# Patient Record
Sex: Female | Born: 1945
Health system: Southern US, Community
[De-identification: ages and names within clinical notes are randomized; demographics above are authoritative.]

## PROBLEM LIST (undated history)

## (undated) DIAGNOSIS — Z803 Family history of malignant neoplasm of breast: Secondary | ICD-10-CM

## (undated) DIAGNOSIS — M797 Fibromyalgia: Secondary | ICD-10-CM

## (undated) DIAGNOSIS — R112 Nausea with vomiting, unspecified: Secondary | ICD-10-CM

## (undated) DIAGNOSIS — Z9889 Other specified postprocedural states: Secondary | ICD-10-CM

## (undated) DIAGNOSIS — D6862 Lupus anticoagulant syndrome: Secondary | ICD-10-CM

## (undated) DIAGNOSIS — E042 Nontoxic multinodular goiter: Secondary | ICD-10-CM

## (undated) DIAGNOSIS — Z8719 Personal history of other diseases of the digestive system: Secondary | ICD-10-CM

## (undated) DIAGNOSIS — F32A Depression, unspecified: Secondary | ICD-10-CM

## (undated) DIAGNOSIS — T4145XA Adverse effect of unspecified anesthetic, initial encounter: Secondary | ICD-10-CM

## (undated) DIAGNOSIS — Z91B Personal risk factor of exposure to diethylstilbestrol: Secondary | ICD-10-CM

## (undated) DIAGNOSIS — E559 Vitamin D deficiency, unspecified: Secondary | ICD-10-CM

## (undated) DIAGNOSIS — Z808 Family history of malignant neoplasm of other organs or systems: Secondary | ICD-10-CM

## (undated) DIAGNOSIS — F419 Anxiety disorder, unspecified: Secondary | ICD-10-CM

## (undated) DIAGNOSIS — Z860100 Personal history of colon polyps, unspecified: Secondary | ICD-10-CM

## (undated) DIAGNOSIS — Z800 Family history of malignant neoplasm of digestive organs: Secondary | ICD-10-CM

## (undated) DIAGNOSIS — I2699 Other pulmonary embolism without acute cor pulmonale: Secondary | ICD-10-CM

## (undated) DIAGNOSIS — Z9189 Other specified personal risk factors, not elsewhere classified: Secondary | ICD-10-CM

## (undated) DIAGNOSIS — R0609 Other forms of dyspnea: Secondary | ICD-10-CM

## (undated) DIAGNOSIS — I447 Left bundle-branch block, unspecified: Secondary | ICD-10-CM

## (undated) DIAGNOSIS — K219 Gastro-esophageal reflux disease without esophagitis: Secondary | ICD-10-CM

## (undated) DIAGNOSIS — E782 Mixed hyperlipidemia: Secondary | ICD-10-CM

## (undated) DIAGNOSIS — Z8041 Family history of malignant neoplasm of ovary: Secondary | ICD-10-CM

## (undated) DIAGNOSIS — G479 Sleep disorder, unspecified: Secondary | ICD-10-CM

## (undated) DIAGNOSIS — T8859XA Other complications of anesthesia, initial encounter: Secondary | ICD-10-CM

## (undated) DIAGNOSIS — R001 Bradycardia, unspecified: Secondary | ICD-10-CM

## (undated) DIAGNOSIS — I82409 Acute embolism and thrombosis of unspecified deep veins of unspecified lower extremity: Secondary | ICD-10-CM

## (undated) DIAGNOSIS — D069 Carcinoma in situ of cervix, unspecified: Secondary | ICD-10-CM

## (undated) DIAGNOSIS — M1990 Unspecified osteoarthritis, unspecified site: Secondary | ICD-10-CM

## (undated) DIAGNOSIS — F329 Major depressive disorder, single episode, unspecified: Secondary | ICD-10-CM

## (undated) DIAGNOSIS — E079 Disorder of thyroid, unspecified: Secondary | ICD-10-CM

## (undated) DIAGNOSIS — K589 Irritable bowel syndrome without diarrhea: Secondary | ICD-10-CM

## (undated) DIAGNOSIS — R9431 Abnormal electrocardiogram [ECG] [EKG]: Secondary | ICD-10-CM

## (undated) DIAGNOSIS — G2581 Restless legs syndrome: Secondary | ICD-10-CM

## (undated) HISTORY — DX: Unspecified osteoarthritis, unspecified site: M19.90

## (undated) HISTORY — DX: Abnormal electrocardiogram (ECG) (EKG): R94.31

## (undated) HISTORY — DX: Lupus anticoagulant syndrome: D68.62

## (undated) HISTORY — DX: Bradycardia, unspecified: R00.1

## (undated) HISTORY — DX: Vitamin D deficiency, unspecified: E55.9

## (undated) HISTORY — DX: Family history of malignant neoplasm of breast: Z80.3

## (undated) HISTORY — PX: FACIAL COSMETIC SURGERY: SHX629

## (undated) HISTORY — PX: TONSILLECTOMY: SUR1361

## (undated) HISTORY — DX: Other specified personal risk factors, not elsewhere classified: Z91.89

## (undated) HISTORY — PX: MYOMECTOMY: SHX85

## (undated) HISTORY — DX: Personal history of colon polyps, unspecified: Z86.0100

## (undated) HISTORY — DX: Family history of malignant neoplasm of ovary: Z80.41

## (undated) HISTORY — PX: OTHER SURGICAL HISTORY: SHX169

## (undated) HISTORY — PX: ESOPHAGOGASTRODUODENOSCOPY ENDOSCOPY: SHX5814

## (undated) HISTORY — DX: Carcinoma in situ of cervix, unspecified: D06.9

## (undated) HISTORY — DX: Disorder of thyroid, unspecified: E07.9

## (undated) HISTORY — DX: Family history of malignant neoplasm of other organs or systems: Z80.8

## (undated) HISTORY — DX: Fibromyalgia: M79.7

## (undated) HISTORY — DX: Other forms of dyspnea: R06.09

## (undated) HISTORY — DX: Acute embolism and thrombosis of unspecified deep veins of unspecified lower extremity: I82.409

## (undated) HISTORY — DX: Other pulmonary embolism without acute cor pulmonale: I26.99

## (undated) HISTORY — DX: Sleep disorder, unspecified: G47.9

## (undated) HISTORY — DX: Personal risk factor of exposure to diethylstilbestrol: Z91.B

## (undated) HISTORY — DX: Family history of malignant neoplasm of digestive organs: Z80.0

## (undated) HISTORY — PX: GYNECOLOGIC CRYOSURGERY: SHX857

## (undated) HISTORY — DX: Irritable bowel syndrome without diarrhea: K58.9

## (undated) HISTORY — DX: Left bundle-branch block, unspecified: I44.7

## (undated) HISTORY — DX: Personal history of colon polyps: Z86.010

## (undated) HISTORY — DX: Nontoxic multinodular goiter: E04.2

---

## 1976-08-01 HISTORY — PX: APPENDECTOMY: SHX54

## 1998-01-22 ENCOUNTER — Other Ambulatory Visit: Admission: RE | Admit: 1998-01-22 | Discharge: 1998-01-22 | Payer: Self-pay | Admitting: Obstetrics and Gynecology

## 1998-03-23 ENCOUNTER — Other Ambulatory Visit: Admission: RE | Admit: 1998-03-23 | Discharge: 1998-03-23 | Payer: Self-pay | Admitting: Obstetrics and Gynecology

## 1998-07-08 ENCOUNTER — Ambulatory Visit (HOSPITAL_COMMUNITY): Admission: RE | Admit: 1998-07-08 | Discharge: 1998-07-08 | Payer: Self-pay | Admitting: Gastroenterology

## 1998-08-01 DIAGNOSIS — D069 Carcinoma in situ of cervix, unspecified: Secondary | ICD-10-CM

## 1998-08-01 HISTORY — DX: Carcinoma in situ of cervix, unspecified: D06.9

## 1998-09-23 ENCOUNTER — Encounter: Payer: Self-pay | Admitting: Internal Medicine

## 1998-09-23 ENCOUNTER — Ambulatory Visit (HOSPITAL_COMMUNITY): Admission: RE | Admit: 1998-09-23 | Discharge: 1998-09-23 | Payer: Self-pay | Admitting: Internal Medicine

## 1999-03-24 ENCOUNTER — Other Ambulatory Visit: Admission: RE | Admit: 1999-03-24 | Discharge: 1999-03-24 | Payer: Self-pay | Admitting: Obstetrics and Gynecology

## 1999-04-19 ENCOUNTER — Other Ambulatory Visit: Admission: RE | Admit: 1999-04-19 | Discharge: 1999-04-19 | Payer: Self-pay | Admitting: Obstetrics and Gynecology

## 1999-04-19 ENCOUNTER — Encounter (INDEPENDENT_AMBULATORY_CARE_PROVIDER_SITE_OTHER): Payer: Self-pay | Admitting: Specialist

## 1999-06-04 ENCOUNTER — Encounter (INDEPENDENT_AMBULATORY_CARE_PROVIDER_SITE_OTHER): Payer: Self-pay | Admitting: Specialist

## 1999-06-04 ENCOUNTER — Ambulatory Visit (HOSPITAL_COMMUNITY): Admission: RE | Admit: 1999-06-04 | Discharge: 1999-06-04 | Payer: Self-pay | Admitting: Obstetrics and Gynecology

## 1999-08-02 HISTORY — PX: ABDOMINAL HYSTERECTOMY: SHX81

## 1999-08-18 ENCOUNTER — Encounter: Payer: Self-pay | Admitting: Internal Medicine

## 1999-08-18 ENCOUNTER — Encounter: Admission: RE | Admit: 1999-08-18 | Discharge: 1999-08-18 | Payer: Self-pay | Admitting: Internal Medicine

## 1999-09-20 ENCOUNTER — Other Ambulatory Visit: Admission: RE | Admit: 1999-09-20 | Discharge: 1999-09-20 | Payer: Self-pay | Admitting: Obstetrics and Gynecology

## 1999-11-01 ENCOUNTER — Encounter (INDEPENDENT_AMBULATORY_CARE_PROVIDER_SITE_OTHER): Payer: Self-pay

## 1999-11-01 ENCOUNTER — Inpatient Hospital Stay (HOSPITAL_COMMUNITY): Admission: RE | Admit: 1999-11-01 | Discharge: 1999-11-04 | Payer: Self-pay | Admitting: Obstetrics and Gynecology

## 2001-01-19 ENCOUNTER — Other Ambulatory Visit: Admission: RE | Admit: 2001-01-19 | Discharge: 2001-01-19 | Payer: Self-pay | Admitting: Obstetrics and Gynecology

## 2001-06-04 ENCOUNTER — Encounter: Admission: RE | Admit: 2001-06-04 | Discharge: 2001-06-04 | Payer: Self-pay | Admitting: Rheumatology

## 2001-06-04 ENCOUNTER — Encounter: Payer: Self-pay | Admitting: Rheumatology

## 2001-07-11 ENCOUNTER — Ambulatory Visit (HOSPITAL_COMMUNITY): Admission: RE | Admit: 2001-07-11 | Discharge: 2001-07-11 | Payer: Self-pay | Admitting: Internal Medicine

## 2001-08-27 ENCOUNTER — Ambulatory Visit (HOSPITAL_COMMUNITY): Admission: RE | Admit: 2001-08-27 | Discharge: 2001-08-27 | Payer: Self-pay | Admitting: Gastroenterology

## 2002-01-28 ENCOUNTER — Other Ambulatory Visit: Admission: RE | Admit: 2002-01-28 | Discharge: 2002-01-28 | Payer: Self-pay | Admitting: Obstetrics and Gynecology

## 2002-09-19 ENCOUNTER — Encounter: Payer: Self-pay | Admitting: General Surgery

## 2002-09-19 ENCOUNTER — Ambulatory Visit (HOSPITAL_COMMUNITY): Admission: RE | Admit: 2002-09-19 | Discharge: 2002-09-19 | Payer: Self-pay | Admitting: General Surgery

## 2002-09-20 ENCOUNTER — Encounter: Payer: Self-pay | Admitting: General Surgery

## 2002-10-12 ENCOUNTER — Encounter: Admission: RE | Admit: 2002-10-12 | Discharge: 2002-10-12 | Payer: Self-pay | Admitting: Rheumatology

## 2002-10-12 ENCOUNTER — Encounter: Payer: Self-pay | Admitting: Rheumatology

## 2002-11-29 ENCOUNTER — Encounter: Payer: Self-pay | Admitting: Internal Medicine

## 2002-11-29 ENCOUNTER — Encounter: Admission: RE | Admit: 2002-11-29 | Discharge: 2002-11-29 | Payer: Self-pay | Admitting: Internal Medicine

## 2003-01-29 ENCOUNTER — Other Ambulatory Visit: Admission: RE | Admit: 2003-01-29 | Discharge: 2003-01-29 | Payer: Self-pay | Admitting: Obstetrics and Gynecology

## 2003-04-04 ENCOUNTER — Observation Stay (HOSPITAL_COMMUNITY): Admission: EM | Admit: 2003-04-04 | Discharge: 2003-04-05 | Payer: Self-pay | Admitting: *Deleted

## 2003-04-04 ENCOUNTER — Encounter: Payer: Self-pay | Admitting: *Deleted

## 2003-04-08 ENCOUNTER — Encounter (HOSPITAL_COMMUNITY): Admission: RE | Admit: 2003-04-08 | Discharge: 2003-07-07 | Payer: Self-pay | Admitting: Endocrinology

## 2003-04-10 ENCOUNTER — Encounter: Payer: Self-pay | Admitting: Endocrinology

## 2004-01-30 ENCOUNTER — Other Ambulatory Visit: Admission: RE | Admit: 2004-01-30 | Discharge: 2004-01-30 | Payer: Self-pay | Admitting: Obstetrics and Gynecology

## 2004-04-01 DIAGNOSIS — I82409 Acute embolism and thrombosis of unspecified deep veins of unspecified lower extremity: Secondary | ICD-10-CM | POA: Insufficient documentation

## 2004-04-26 ENCOUNTER — Encounter: Admission: RE | Admit: 2004-04-26 | Discharge: 2004-04-26 | Payer: Self-pay | Admitting: Family Medicine

## 2004-04-26 ENCOUNTER — Emergency Department (HOSPITAL_COMMUNITY): Admission: EM | Admit: 2004-04-26 | Discharge: 2004-04-26 | Payer: Self-pay | Admitting: Emergency Medicine

## 2004-05-27 ENCOUNTER — Emergency Department (HOSPITAL_COMMUNITY): Admission: EM | Admit: 2004-05-27 | Discharge: 2004-05-27 | Payer: Self-pay | Admitting: Emergency Medicine

## 2004-07-30 ENCOUNTER — Encounter: Admission: RE | Admit: 2004-07-30 | Discharge: 2004-07-30 | Payer: Self-pay | Admitting: Internal Medicine

## 2005-01-05 ENCOUNTER — Ambulatory Visit (HOSPITAL_COMMUNITY): Admission: RE | Admit: 2005-01-05 | Discharge: 2005-01-05 | Payer: Self-pay | Admitting: Internal Medicine

## 2005-02-16 ENCOUNTER — Other Ambulatory Visit: Admission: RE | Admit: 2005-02-16 | Discharge: 2005-02-16 | Payer: Self-pay | Admitting: Obstetrics and Gynecology

## 2005-07-08 ENCOUNTER — Encounter: Admission: RE | Admit: 2005-07-08 | Discharge: 2005-07-08 | Payer: Self-pay | Admitting: Internal Medicine

## 2005-09-30 ENCOUNTER — Ambulatory Visit (HOSPITAL_COMMUNITY): Admission: RE | Admit: 2005-09-30 | Discharge: 2005-09-30 | Payer: Self-pay | Admitting: Internal Medicine

## 2006-03-01 ENCOUNTER — Other Ambulatory Visit: Admission: RE | Admit: 2006-03-01 | Discharge: 2006-03-01 | Payer: Self-pay | Admitting: Obstetrics and Gynecology

## 2006-03-06 ENCOUNTER — Encounter: Admission: RE | Admit: 2006-03-06 | Discharge: 2006-03-06 | Payer: Self-pay | Admitting: Internal Medicine

## 2007-03-07 ENCOUNTER — Other Ambulatory Visit: Admission: RE | Admit: 2007-03-07 | Discharge: 2007-03-07 | Payer: Self-pay | Admitting: Obstetrics and Gynecology

## 2007-07-17 ENCOUNTER — Ambulatory Visit: Payer: Self-pay | Admitting: Oncology

## 2007-08-28 ENCOUNTER — Ambulatory Visit: Admission: RE | Admit: 2007-08-28 | Discharge: 2007-08-28 | Payer: Self-pay | Admitting: Oncology

## 2007-08-28 ENCOUNTER — Ambulatory Visit: Payer: Self-pay | Admitting: Surgery

## 2007-08-28 ENCOUNTER — Encounter (HOSPITAL_COMMUNITY): Payer: Self-pay | Admitting: Oncology

## 2007-10-18 ENCOUNTER — Ambulatory Visit: Payer: Self-pay | Admitting: Oncology

## 2008-04-08 ENCOUNTER — Other Ambulatory Visit: Admission: RE | Admit: 2008-04-08 | Discharge: 2008-04-08 | Payer: Self-pay | Admitting: Obstetrics and Gynecology

## 2008-04-08 ENCOUNTER — Ambulatory Visit: Payer: Self-pay | Admitting: Obstetrics and Gynecology

## 2008-04-08 ENCOUNTER — Encounter: Payer: Self-pay | Admitting: Obstetrics and Gynecology

## 2008-09-08 ENCOUNTER — Ambulatory Visit: Payer: Self-pay | Admitting: Obstetrics and Gynecology

## 2008-09-12 ENCOUNTER — Ambulatory Visit (HOSPITAL_COMMUNITY): Admission: RE | Admit: 2008-09-12 | Discharge: 2008-09-12 | Payer: Self-pay | Admitting: Obstetrics and Gynecology

## 2009-01-29 DIAGNOSIS — I82409 Acute embolism and thrombosis of unspecified deep veins of unspecified lower extremity: Secondary | ICD-10-CM

## 2009-01-29 HISTORY — DX: Acute embolism and thrombosis of unspecified deep veins of unspecified lower extremity: I82.409

## 2009-02-13 ENCOUNTER — Ambulatory Visit: Payer: Self-pay | Admitting: Women's Health

## 2009-02-27 ENCOUNTER — Inpatient Hospital Stay (HOSPITAL_COMMUNITY): Admission: AD | Admit: 2009-02-27 | Discharge: 2009-03-03 | Payer: Self-pay | Admitting: Internal Medicine

## 2009-03-27 ENCOUNTER — Ambulatory Visit: Payer: Self-pay | Admitting: Oncology

## 2009-12-16 ENCOUNTER — Ambulatory Visit: Payer: Self-pay | Admitting: Obstetrics and Gynecology

## 2009-12-16 ENCOUNTER — Other Ambulatory Visit: Admission: RE | Admit: 2009-12-16 | Discharge: 2009-12-16 | Payer: Self-pay | Admitting: Obstetrics and Gynecology

## 2010-08-22 ENCOUNTER — Encounter: Payer: Self-pay | Admitting: Internal Medicine

## 2010-11-07 LAB — CBC
HCT: 36.1 % (ref 36.0–46.0)
HCT: 38.4 % (ref 36.0–46.0)
HCT: 38.5 % (ref 36.0–46.0)
HCT: 38.2 % (ref 36.0–46.0)
HCT: 43.7 % (ref 36.0–46.0)
Hemoglobin: 12.3 g/dL (ref 12.0–15.0)
Hemoglobin: 13 g/dL (ref 12.0–15.0)
Hemoglobin: 13 g/dL (ref 12.0–15.0)
Hemoglobin: 12.6 g/dL (ref 12.0–15.0)
Hemoglobin: 14.8 g/dL (ref 12.0–15.0)
MCHC: 33.8 g/dL (ref 30.0–36.0)
MCHC: 33.8 g/dL (ref 30.0–36.0)
MCHC: 34.1 g/dL (ref 30.0–36.0)
MCHC: 33.1 g/dL (ref 30.0–36.0)
MCHC: 33.9 g/dL (ref 30.0–36.0)
MCV: 93.9 fL (ref 78.0–100.0)
MCV: 94.1 fL (ref 78.0–100.0)
MCV: 94.8 fL (ref 78.0–100.0)
MCV: 94.5 fL (ref 78.0–100.0)
MCV: 94.6 fL (ref 78.0–100.0)
Platelets: 182 10*3/uL (ref 150–400)
Platelets: 199 10*3/uL (ref 150–400)
Platelets: 240 10*3/uL (ref 150–400)
Platelets: 166 10*3/uL (ref 150–400)
Platelets: 167 10*3/uL (ref 150–400)
RBC: 3.85 MIL/uL — ABNORMAL LOW (ref 3.87–5.11)
RBC: 4.06 MIL/uL (ref 3.87–5.11)
RBC: 4.08 MIL/uL (ref 3.87–5.11)
RBC: 4.04 MIL/uL (ref 3.87–5.11)
RBC: 4.62 MIL/uL (ref 3.87–5.11)
RDW: 13.8 % (ref 11.5–15.5)
RDW: 13.9 % (ref 11.5–15.5)
RDW: 14.3 % (ref 11.5–15.5)
RDW: 14.1 % (ref 11.5–15.5)
RDW: 14.4 % (ref 11.5–15.5)
WBC: 10.6 10*3/uL — ABNORMAL HIGH (ref 4.0–10.5)
WBC: 8.6 10*3/uL (ref 4.0–10.5)
WBC: 9.6 10*3/uL (ref 4.0–10.5)
WBC: 11.4 10*3/uL — ABNORMAL HIGH (ref 4.0–10.5)
WBC: 12.7 10*3/uL — ABNORMAL HIGH (ref 4.0–10.5)

## 2010-11-07 LAB — COMPREHENSIVE METABOLIC PANEL
ALT: 21 U/L (ref 0–35)
AST: 25 U/L (ref 0–37)
Albumin: 3 g/dL — ABNORMAL LOW (ref 3.5–5.2)
Alkaline Phosphatase: 83 U/L (ref 39–117)
BUN: 9 mg/dL (ref 6–23)
CO2: 28 mEq/L (ref 19–32)
Calcium: 8.9 mg/dL (ref 8.4–10.5)
Chloride: 98 mEq/L (ref 96–112)
Creatinine, Ser: 0.64 mg/dL (ref 0.4–1.2)
GFR calc Af Amer: >60 mL/min (ref >60)
GFR calc non Af Amer: >60 mL/min (ref >60)
Glucose, Bld: 112 mg/dL — ABNORMAL HIGH (ref 70–99)
Potassium: 4 mEq/L (ref 3.5–5.1)
Sodium: 135 mEq/L (ref 135–145)
Total Bilirubin: 0.9 mg/dL (ref 0.3–1.2)
Total Protein: 6.6 g/dL (ref 6.0–8.3)

## 2010-11-07 LAB — PROTIME-INR
INR: 2 — ABNORMAL HIGH (ref 0.00–1.49)
INR: 2.6 — ABNORMAL HIGH (ref 0.00–1.49)
INR: 3.1 — ABNORMAL HIGH (ref 0.00–1.49)
INR: 1.1 (ref 0.00–1.49)
INR: 1.2 (ref 0.00–1.49)
Prothrombin Time: 24.1 seconds — ABNORMAL HIGH (ref 11.6–15.2)
Prothrombin Time: 29.4 seconds — ABNORMAL HIGH (ref 11.6–15.2)
Prothrombin Time: 31.5 seconds — ABNORMAL HIGH (ref 11.6–15.2)
Prothrombin Time: 15 seconds (ref 11.6–15.2)
Prothrombin Time: 16 seconds — ABNORMAL HIGH (ref 11.6–15.2)

## 2010-12-14 NOTE — Discharge Summary (Signed)
Emma Lopez, Emma Lopez                 ACCOUNT NO.:  [id number]   MEDICAL RECORD NO.:  [id number]          PATIENT TYPE:  INP   LOCATION:  1537                         FACILITY:  [facility]   PHYSICIAN:  Hollice Espy, M.D.DATE OF BIRTH:  Nov 30, 1945   DATE OF ADMISSION:  02/27/2009  DATE OF DISCHARGE:  03/03/2009                               DISCHARGE SUMMARY   PRIMARY CARE PHYSICIAN:  Juluis Rainier, M.D.   DISCHARGE DIAGNOSES:  1. Bilateral pulmonary emboli.  2. Previous history of deep venous thrombosis  3. Suspected underlying genetic predisposition for thrombosis.  4. History of depression.  5. History of anxiety.  6. Multinodular goiter.  7. Hyperlipidemia.   DISCHARGE MEDICATIONS:  1. Coumadin 1 mg p.o. q.h.s.  This dose may be adjusted following the      patient's follow up appointment with Dr. Juluis Rainier at 11      a.m. on March 06, 2009.  2. She will continue the rest of her medications without change,      Coenzyme Q 2 teaspoons p.o. daily.  3. Multivitamin p.o. daily.  4. Os-Cal 500 D p.o. b.i.d.  5. Vitamin D 10,000 units weekly.  6. Fish oil 3 tablets daily.  7. Fluticasone 2 sprays each nostril daily.  8. Klonopin 1 mg p.o. t.i.d. p.r.n.  9. Lexapro 20 mg p.o. daily.   DISCHARGE DIET:  Regular diet.   ACTIVITY:  Slow to increase.  The patient is advised to be cautious  while on Coumadin to avoid any type of injury.   DISPOSITION:  Improved.   HOSPITAL COURSE:  The patient is a 65 year old white female with past  medical history of previous DVT who presented on February 27, 2009 with  complaints of upper thigh pain and left lower extremity swelling.  She  was evaluated by Dr. Zachery Dauer' partner, Dr. Para March, who ordered Doppler  and the patient was found have a left lower extremity DVT.  The patient  was brought in because she already had a previous DVT earlier.  This  time she was noted to have some minimal dyspnea on exertion.  Labs were  checked and a CT  scan confirmed bilateral pulmonary emboli.  In  addition, based on her previous DVT of which a discernible cause could  not be found, there was a concern about the patient had genetic  underlying predisposition.  Dr. Crista Curb, Hospitalist, caring  for the patient on admission, discussed with Dr. Arline Asp of Hematology,  who recommended that the patient likely had a underlying genetic  condition.  He recommended no further workup at this time.  He said he  would follow the patient as an outpatient.  In the meantime, he  preferred that her primary care doctor manage her Coumadin level.  Over  the next several days, the patient was on Lovenox and Coumadin.  Initially she was put on 5 mg x2 days.  After 2 days, her level rapidly  elevated from a normal range of 1 up to1.6 up to 2.11.  Given her rapid  change, she was given no Coumadin on her  third night and her level went  up to 2.5.  This was felt to be that she was quite sensitive to  Coumadin.  Her dose was then cut in half.  After receiving no Coumadin  on night 4, she received 2.5.  Her level went from 2.5 up to 3.1.  At  this point, it was discussed with pharmacy and the patient was felt to  be extremely sensitive to Coumadin.  It was felt best that she go home  on 1 mg every night starting tonight March 02, 2009.  At this point,  she had completed 5 days of overlap therapy of Coumadin and Lovenox.  It  was felt to be safe to be discharged on Coumadin alone.  We discussed  this with the patient and she otherwise is doing well.  She is  comfortable going home with this plan of following up with her PCP, Dr.  Zachery Dauer in 3 days' time at 11 a.m. on March 06, 2009.  Dr. Zachery Dauer' office  number has been provided for the patient, [phone] as well.  The only  other medication the patient has being given for is Vicodin 5/500 one  p.o. q.6 h. p.r.n. for pain.  The patient will also follow up with Dr.  Kimberlee Nearing at a later point for  further evaluation.  The patient's  other medical issues were stable during this hospitalization.      Hollice Espy, M.D.  Electronically Signed     SKK/MEDQ  D:  03/03/2009  T:  03/03/2009  Job:  [id number]   cc:   Juluis Rainier, M.D.  Fax: [phone]   Samul Dada, M.D.  Fax: [phone]

## 2010-12-14 NOTE — H&P (Signed)
Emma Lopez, Emma Lopez                 ACCOUNT NO.:  [id number]   MEDICAL RECORD NO.:  [id number]          PATIENT TYPE:  INP   LOCATION:  1537                         FACILITY:  [facility]   PHYSICIAN:  Corinna L. Lendell Caprice, MDDATE OF BIRTH:  October 12, 1945   DATE OF ADMISSION:  02/27/2009  DATE OF DISCHARGE:                              HISTORY & PHYSICAL   CHIEF COMPLAINT:  Left leg pain.   HISTORY OF PRESENT ILLNESS:  [name] is a 65 year old white female  with previous history of right leg DVT who was directly admitted from  Dr. Lianne Bushy office with acute left leg DVT.  She developed left thigh  pain and left calf pain about 4 days ago.  Of note, she did fracture a  rib about 5 days ago.  She apparently had a hypercoagulable workup done  as an outpatient several years ago after her previous DVT.  She also  apparently had seen Dr. Arline Asp.  The workup was reportedly negative.  The patient had an outpatient Doppler of the left leg which reportedly  showed DVT of the distal and proximal veins of the left leg.  Dr. Para March  has given me this verbal report, but the official reading is not  currently available.  I have asked his office staff to fax it over.  The  patient reports some chronic shortness of breath from anxiety but maybe  feels slightly more short of breath.  She denies any chest pain.  She  also reports low-grade fever of 99.9 with some myalgias.  Her sister  also had a deep vein thrombosis.   PAST MEDICAL HISTORY:  As above, depression, anxiety, multinodular  goiter, osteoporosis, fibromyalgia, gastroesophageal reflux disease,  hyperlipidemia.   MEDICATIONS:  1. Lexapro 20 mg a day.  2. Klonopin 1 mg as needed.  3. Fluticasone spray daily as needed.  4. Fish oil capsule, Coenzyme Q 10, multivitamin a day.  5. Calcium with vitamin D.  6. Vitamin D 10,000 units weekly.   SOCIAL HISTORY:  She is not married.  She drinks rarely.  She does not  smoke.  She is a retired Runner, broadcasting/film/video  and travel Water quality scientist.   FAMILY HISTORY:  Mother had breast cancer and pulmonary embolus.  Sister  has a DVT.  Father had colon cancer, heart disease.   REVIEW OF SYSTEMS:  As above, otherwise negative.   PHYSICAL EXAMINATION:  VITAL SIGNS:  Temperature is 98.6, blood pressure  133/77, heart rate 76, respiratory rate 18, oxygen saturation 99% on  room air. IN GENERAL:  The patient is well nourished, well developed, in  no acute distress.  HEENT: Normocephalic, atraumatic.  Pupils equal, round, reactive to  light.  Sclerae nonicteric.  Moist mucous membranes.  NECK:  Supple.  No lymphadenopathy.  LUNGS:  Clear to auscultation bilaterally without wheezes, rhonchi or  rales.  CARDIOVASCULAR:  Regular rate and rhythm without murmurs, gallops or  rubs.  ABDOMEN:  Soft, nontender, nondistended.  GU AND RECTAL:  Deferred.  EXTREMITIES:  She has some tenderness over her left calf and thigh with  a positive Homans' sign.  NEUROLOGIC:  Alert and oriented.  Cranial nerves and sensory motor exam  are intact.  PSYCHIATRIC:  Normal affect.  SKIN:  No rash.  MUSCULOSKELETAL:  She has an abdominal binder on and some tenderness.  No bruising over her left chest.   ASSESSMENT/PLAN:  1. Left leg deep venous thrombosis with a history of previous right      leg deep venous thrombosis.  The patient had a hypercoagulable      workup previously which was negative.  She will get Lovenox and      Coumadin and will need long-term Coumadin.  I will notify Dr.      Arline Asp of patient's recent events.  She has given herself Lovenox      in the past, and she may be a candidate for outpatient Lovenox.      However, she does report some worsening of her chronic shortness of      breath, and I will check a CT angiogram to evaluate for pulmonary      embolus.  2. Previous history of deep venous thrombosis.  3. Depression/anxiety.  4. Fibromyalgia.  5. Multinodular goiter.  6. Hyperlipidemia.  7. Osteoporosis.   8. Recent rib fracture.      Corinna L. Lendell Caprice, MD  Electronically Signed     CLS/MEDQ  D:  02/27/2009  T:  02/28/2009  Job:  [id number]   cc:   Dossie Der, MD  Fax: [phone]   Samul Dada, M.D.  Fax: [phone]

## 2010-12-17 NOTE — Procedures (Signed)
[facility]  Patient:    Emma Lopez, Emma Lopez Visit Number: [id number] MRN: [id number]          Service Type: Attending:  Verlin Grills, M.D. Dictated by:   Verlin Grills, M.D. Proc. Date: 08/27/01                             Procedure Report  PROCEDURES:  Esophagogastroduodenoscopy and colonoscopy.  INDICATION FOR PROCEDURE:  Emma Lopez is a 65 year old female born 1945-08-30. She is undergoing diagnostic colonoscopy to evaluate an episode of painless hematochezia. She also reports excessive belching and dysphagia.  In January 1997, an air contrast barium enema was normal. In September 1998, an air contrast barium enema was normal. In December 1999, proctocolonoscopy to the cecum was normal. In April 2000, upper GI x-ray series was normal except for extrinsic compression on the esophagus; CT scan of the chest revealed the left atrium compressing the esophagus. January 2001, abdominal ultrasound was normal. April 19, 2000, flexible proctosigmoidoscopy carried out to 60 cm revealed nonthrombosed external hemorrhoids.  ENDOSCOPIST:  Verlin Grills, M.D.  PREMEDICATION:  Versed 10 mg, Demerol 100 mg.  ENDOSCOPE:  Olympus gastroscope and pediatric colonoscope.  PROCEDURE:  Esophagogastroduodenoscopy.  DESCRIPTION OF PROCEDURE:  After obtaining informed consent, Emma Lopez was placed in the left lateral decubitus position. I administered intravenous Demerol and intravenous Versed to achieve conscious sedation for the procedure. The patients cardiac rhythm, oxygen saturation and blood pressure were monitored throughout the procedure and documented in the medical record.  The Olympus video gastroscope was passed through the posterior hypopharynx into the proximal esophagus without difficulty. The hypopharynx, larynx, and vocal chords appeared normal.  ESOPHAGOSCOPY:  The proximal, mid, and lower segments of the esophageal  mucosa appeared completely normal. There is no esophageal mucosal scarring, stricture formation or esophageal obstruction.  GASTROSCOPY:  Retroflexed view of the gastric cardia and fundus was normal. The gastric body, antrum, and pylorus appeared normal.  DUODENOSCOPY:  The duodenal bulb and descending duodenum appeared normal.  ASSESSMENT:  Normal esophagogastroduodenoscopy.  PROCEDURE:  Proctocolonoscopy to the cecum.  DESCRIPTION OF PROCEDURE:   Anal inspection was normal. Digital rectal exam was normal. The Olympus pediatric video colonoscope was introduced into the rectum and easily advanced to the cecum. Colonic preparation for the exam today was excellent.  RECTUM:  Normal.  SIGMOID COLON/DESCENDING COLON:  Normal.  SPLENIC FLEXURE:  Normal.  TRANSVERSE COLON:  Normal.  HEPATIC FLEXURE:  Normal.  ASCENDING COLON:  Normal.  CECUM/ILEOCECAL VALVE:  Normal.  ASSESSMENT:  Normal proctocolonoscopy to the cecum. No endoscopic evidence for the presence of colorectal neoplasia. Dictated by:   Verlin Grills, M.D. Attending:  Verlin Grills, M.D. DD:  08/27/01 TD:  08/27/01 Job: [id number]

## 2010-12-17 NOTE — H&P (Signed)
NAMESHERISSE, FULLILOVE                           ACCOUNT NO.:  [id number]   MEDICAL RECORD NO.:  [id number]                   PATIENT TYPE:  EMS   LOCATION:  ED                                   [facility]ACILITY:  [facility]   PHYSICIAN:  Darius Bump, M.D.             DATE OF BIRTH:  02/20/1946   DATE OF ADMISSION:  04/04/2003  DATE OF DISCHARGE:                                HISTORY & PHYSICAL   IDENTIFICATION:  A 65 year old woman admitted with chest pain.   HISTORY OF PRESENT ILLNESS:  Mrs. Rolon is a pleasant 65 year old woman who  awoke from a sleep at 4 a.m. today.  She was having a strange dream.  She  noted palpitations on awakening and then had left-sided lateral chest wall  pressure and tingling in her left arm.  She also had symptoms that radiated  to her left neck towards her jaw bone.  She had nausea without vomiting and  felt lightheaded.  Did not feel short of breath.  Pressure and tingling  sensation in her chest and arm were distinct from episodes that she has had  in the past that were felt to be due to panic.  She took two aspirin at home  and then came into the emergency room.  Here she had a slight amount of  chest pain and had a nitroglycerin.  It is unclear if that relieved her pain  or not.  Pain is currently improved.   PAST MEDICAL HISTORY:  1. Osteoporosis.  2. Symptomatic GERD.  3. Fibromyalgia.  4. Toxic multinodular goiter (mild subclinical hyperthyroidism).  5. Panic attacks.  6. Carpal tunnel syndrome previously treated with a wrist splint with     improvement.  7. Normal lipids when checked last in September 2002 with cholesterol 172,     LDL 100, HDL 54.   MEDICATIONS:  1. Calcium 1200 mg daily.  2. Has taken some Motrin recently for knee pain.  3. Evista 60 mg p.o. daily.  4. Klonopin 0.5 mg.  Takes about once a week for anxiety.  5. Tapazole previously stopped about two months ago.  Recent TSH was     slightly suppressed at about 0.2.  6. Was  on Prevacid in the past, now takes erratically.   ALLERGIES:  SEPTRA causes a rash.   PAST SURGICAL HISTORY:  1. Hysterectomy secondary to dysplasia (partial).  2. Tonsillectomy.  3. Wisdom teeth.   FAMILY HISTORY:  Father died at 26 of colon cancer.  Had CAD in his 9s.  Mother died at 33, had breast cancer and hypertension.  Sister has  hyperlipidemia on Lipitor and borderline blood pressure.   SOCIAL HISTORY:  No tobacco since 1994.  She is retired.  Lives alone.  Very  occasional alcohol.  No street drugs.   REVIEW OF SYSTEMS:  No headache.  No recent fever or chills.  No presyncope  symptoms.  Has had some heartburn symptoms this week.  Took Prevacid and  Maalox.  Has taken more Motrin recently for knee arthritis as above.  When  she had heartburn symptoms she switched to Tylenol.  Also feels she had a  fibromyalgia flare earlier in the week but that seemed to be slightly  better.   PHYSICAL EXAMINATION:  GENERAL:  Pleasant, articulate, no acute distress.  VITAL SIGNS:  Temperature 97.7, blood pressure 135/66, heart rate 72,  respirations 18, saturation 100% on room air.  HEENT:  Pupils are equal, round, and reactive to light.  Extraocular muscles  intact.  Oropharynx is clear.  NECK:  Supple without lymphadenopathy, JVD, or bruits.  LUNGS:  Clear bilaterally.  Has left-sided chest wall tenderness, minimal  under her left breast.  HEART:  Regular rate and rhythm without murmur or gallop.  ABDOMEN:  Soft, nontender with normal bowel sounds.  No organomegaly or  bruits.  EXTREMITIES:  Without edema.  She has borderline positive Tinel's test on  the right as compared to the left and 2+ pulses.  NEUROLOGIC:  Nonfocal.  RECTAL:  Normal sphincter tone.  Stools heme-negative.   LABORATORIES:  White count 7.2, hemoglobin 14.8, platelet count 222,000.  BMET is normal apart from potassium 3.8, BUN 15, creatinine 0.8, glucose  115, but probably nonfasting.  CK 55 with an MB of 1.2,  troponin 0.01.  EKG  shows normal sinus rhythm with a rate of 72, axis 45.  There are nonspecific  ST-T changes, very slight ST depression in the lateral leads.   ASSESSMENT:  1. Atypical chest pain, deemed that it was at rest.  Had some worrisome     associated symptoms, but feel that this may simply be due to     gastroesophageal reflux disease.  We are going to admit her and rule out     myocardial infarction, put her on telemetry, obtain a cardiology consult.     If she rules out she may be a candidate for an outpatient work-up.  She     is to receive aspirin and nitroglycerin.  Will recheck her lipids.  I am     going to place her on a PPI empirically.  2. Glucose of 115, unclear fasting.  Recheck fasting blood sugar and     hemoglobin A1C.  3. Protonix for gastroesophageal reflux disease.  4. Continue Evista for osteoporosis.  5. Ativan as needed for anxiety.  6. Toxic multinodular goiter under care of Dorisann Frames, M.D.  Due for     __________ scan later this week.  Will recheck TSH today.  7. Question whether carpal tunnel syndrome is related to hand tingling.     Consider wrist splints which she has available at home.  Discussed     admission with patient and she is willing.                                               Darius Bump, M.D.    MJM/MEDQ  D:  04/04/2003  T:  04/04/2003  Job:  [id number]

## 2010-12-17 NOTE — Consult Note (Signed)
NAMELAKEITHIA, Lopez                           ACCOUNT NO.:  [id number]   MEDICAL RECORD NO.:  [id number]                   PATIENT TYPE:  INP   LOCATION:  0379                                 FACILITY:  [facility]   PHYSICIAN:  Meade Maw, M.D.                 DATE OF BIRTH:  [birth year]   DATE OF CONSULTATION:  DATE OF DISCHARGE:  04/05/2003                                   CONSULTATION   REFERRING PHYSICIAN:  Darius Bump, M.D.   INDICATIONS FOR CONSULTATION:  Chest pain.   HISTORY:  Emma Lopez is a pleasant 65 year old female who was admitted earlier  today following a sudden onset of left lateral chest pressure which radiated  to her jaw and was associated with palpitations and left arm discomfort. She  had mild nausea and lightheadedness.  This began at approximately 4 a.m.  after being awakened from a strange dream. She came to the emergency room,  she was given 1 sublingual nitroglycerin and started on IV nitroglycerin  drip; the pain subsequently resolved on its own. She has had no further  chest pain, but some intermittent tingling in her arms and hands.   CORONARY RISK FACTORS:  Significant for age, postmenopausal status, remote  history of tobacco use (she stopped smoking in 1994).  No history of  hypertension, diabetes or dyslipidemia.   PAST MEDICAL HISTORY:  1. GERD.  2. Fibromyalgia.  3. Osteoporosis.  4. Panic attack.  5. Hyperthyroidism.  6. Hysterectomy.  7. Tonsillectomy.   OUTPATIENT MEDICATIONS:  1. Evista 60 mg daily.  2. Klonopin 0.5 mg p.r.n.  3. Calcium daily.  4. Tapazole which was discontinued approximately 2 months prior for upcoming     thyroid test.   CURRENT MEDICATIONS:  1. Evista 60 mg daily.  2. Lovenox per pharmacy protocol.  3. Protonix 40 mg daily.  4. Aspirin 325 mg daily.  5. Tylenol p.r.n., Ambien p.r.n., Ativan p.r.n., Maalox p.r.n.   ALLERGIES:  SEPTRA.   FAMILY HISTORY:  Father passed at 10 from colon cancer.  Mother  passed at 72  from brain cancer.  She has 1 sister who has dyslipidemia and hypertension.   SOCIAL HISTORY:  She lives alone, she is unmarried and she has no children.  Remote history of tobacco use (1994), occasional alcohol.  She is retired as  a Occupational hygienist.   REVIEW OF SYSTEMS:  She notes occasional heartburn, occasional episodes of  tachycardia since her Tapazole has been discontinued.  Increased frequency  of BM's.  No lower extremities edema, no orthopnea.   PHYSICAL EXAMINATION:  VITAL SIGNS:  Blood pressure 112/70, heart rate 86,  respiratory rate 20, afebrile, O2 saturation 97%.  HEENT:  Unremarkable.  NECK:  She has good carotid upstrokes, no neck vein distention, no carotid  bruits.  LUNGS:  Breath sounds which are equal and clear to auscultation.  No use of  accessory muscles.  CARDIOVASCULAR:  Regular rate and rhythm.  No rubs, murmurs or gallops  noted.  ABDOMEN:  Soft, nontender, normal bowel sounds.  EXTREMITIES:  No peripheral edema, distal pulses are equal and palpable.  SKIN:  Warm and dry.  NEUROLOGIC:  Nonfocal.   LABORATORY DATA:  White count 7.2, hematocrit 43, platelet count 222,  platelet count 115.  Potassium 3.8, creatinine 0.8. Serial cardiac enzymes  have been negative x2, troponin I less than 0.01. Chest x-ray revealed no  acute disease.   IMPRESSION:  21. A 65 year old female with atypical chest pain, serial cardiac enzymes     negative. Without recurrent chest pain since admission.  No ischemic     changes noted on ECG.  Minimal risk factors.  She may be discharged to     home.  Cardiolite study planned for an outpatient basis.  2. Palpitations. This may be related to anxiety, stress and hyperthyroidism.     May want to consider addition of beta-blockers.  3. Hypothyroidism. This was followed by Dr. Talmage Nap. Scheduled for an uptake     scan on Tuesday.                                               Meade Maw, M.D.    HP/MEDQ  D:   04/04/2003  T:  04/05/2003  Job:  [id number]

## 2011-01-03 ENCOUNTER — Encounter (INDEPENDENT_AMBULATORY_CARE_PROVIDER_SITE_OTHER): Payer: BC Managed Care – PPO | Admitting: Women's Health

## 2011-01-03 ENCOUNTER — Other Ambulatory Visit (HOSPITAL_COMMUNITY)
Admission: RE | Admit: 2011-01-03 | Discharge: 2011-01-03 | Disposition: A | Discharge status: Home / Self Care | Payer: BC Managed Care – PPO | Source: Ambulatory Visit | Attending: Obstetrics and Gynecology | Admitting: Obstetrics and Gynecology

## 2011-01-03 ENCOUNTER — Other Ambulatory Visit: Payer: Self-pay | Admitting: Women's Health

## 2011-01-03 DIAGNOSIS — N952 Postmenopausal atrophic vaginitis: Secondary | ICD-10-CM

## 2011-01-03 DIAGNOSIS — Z124 Encounter for screening for malignant neoplasm of cervix: Secondary | ICD-10-CM | POA: Insufficient documentation

## 2011-01-03 DIAGNOSIS — Z01419 Encounter for gynecological examination (general) (routine) without abnormal findings: Secondary | ICD-10-CM

## 2011-01-03 DIAGNOSIS — M810 Age-related osteoporosis without current pathological fracture: Secondary | ICD-10-CM

## 2011-01-21 ENCOUNTER — Ambulatory Visit (INDEPENDENT_AMBULATORY_CARE_PROVIDER_SITE_OTHER): Payer: BC Managed Care – PPO | Admitting: Women's Health

## 2011-01-21 ENCOUNTER — Other Ambulatory Visit: Payer: BC Managed Care – PPO

## 2011-01-21 DIAGNOSIS — Z8041 Family history of malignant neoplasm of ovary: Secondary | ICD-10-CM

## 2011-01-21 DIAGNOSIS — R143 Flatulence: Secondary | ICD-10-CM

## 2011-01-21 DIAGNOSIS — R141 Gas pain: Secondary | ICD-10-CM

## 2011-01-21 DIAGNOSIS — R142 Eructation: Secondary | ICD-10-CM

## 2011-01-26 ENCOUNTER — Other Ambulatory Visit: Payer: BC Managed Care – PPO

## 2011-01-26 ENCOUNTER — Ambulatory Visit: Payer: BC Managed Care – PPO | Admitting: Women's Health

## 2011-03-28 ENCOUNTER — Telehealth: Payer: Self-pay | Admitting: *Deleted

## 2011-03-28 ENCOUNTER — Other Ambulatory Visit: Payer: Self-pay | Admitting: *Deleted

## 2011-03-28 MED ORDER — DENOSUMAB 60 MG/ML ~~LOC~~ SOLN
60.0000 mg | Freq: Once | SUBCUTANEOUS | Status: AC
  Administered 2011-03-29: 60 mg via SUBCUTANEOUS

## 2011-03-28 NOTE — Telephone Encounter (Signed)
Lm for patient about prolia.  Patient has $30 copay, appointment set, order in pc.

## 2011-03-29 ENCOUNTER — Encounter: Payer: Self-pay | Admitting: Women's Health

## 2011-03-29 ENCOUNTER — Ambulatory Visit (INDEPENDENT_AMBULATORY_CARE_PROVIDER_SITE_OTHER): Payer: BC Managed Care – PPO | Admitting: Anesthesiology

## 2011-03-29 DIAGNOSIS — M810 Age-related osteoporosis without current pathological fracture: Secondary | ICD-10-CM

## 2011-07-20 ENCOUNTER — Other Ambulatory Visit: Payer: Self-pay | Admitting: Obstetrics and Gynecology

## 2011-08-10 ENCOUNTER — Ambulatory Visit (INDEPENDENT_AMBULATORY_CARE_PROVIDER_SITE_OTHER): Payer: BC Managed Care – PPO | Admitting: Women's Health

## 2011-08-10 ENCOUNTER — Encounter: Payer: Self-pay | Admitting: Women's Health

## 2011-08-10 ENCOUNTER — Other Ambulatory Visit: Payer: Self-pay | Admitting: Women's Health

## 2011-08-10 DIAGNOSIS — R109 Unspecified abdominal pain: Secondary | ICD-10-CM

## 2011-08-10 DIAGNOSIS — R1030 Lower abdominal pain, unspecified: Secondary | ICD-10-CM

## 2011-08-10 LAB — URINALYSIS, ROUTINE W REFLEX MICROSCOPIC
Bilirubin Urine: NEGATIVE
Glucose, UA: NEGATIVE mg/dL
Hgb urine dipstick: NEGATIVE
Ketones, ur: NEGATIVE mg/dL
Nitrite: NEGATIVE
Protein, ur: NEGATIVE mg/dL
Specific Gravity, Urine: 1.015 (ref 1.005–1.030)
Urobilinogen, UA: 0.2 mg/dL (ref 0.0–1.0)
pH: 7 (ref 5.0–8.0)

## 2011-08-10 LAB — URINALYSIS, MICROSCOPIC ONLY
Casts: NONE SEEN
Crystals: NONE SEEN

## 2011-08-10 MED ORDER — ESTRADIOL 10 MCG VA TABS: 1.0000 | ORAL_TABLET | VAGINAL | Status: DC

## 2011-08-10 MED ORDER — ALPRAZOLAM 0.25 MG PO TABS: 0.2500 mg | ORAL_TABLET | Freq: Every evening | ORAL | Status: DC | PRN

## 2011-08-10 NOTE — Progress Notes (Signed)
Patient ID: Emma Lopez, female   DOB: [birth year], 66 y.o.   MRN: [id number] Presents with several issues. Some lower abdominal cramping, vaginal dryness, breast tenderness. Is in a serious relationship and has not been sexually active in many years and questions if will be able to be sexually active. Has started back on Vagifem to help with vaginal dryness has been using every night and noticed some abdominal cramping. Has not been sexually active yet. Had a fall, hit her left breast no visible bruising but states has had some soreness, tenderness is decreasing. Larey Seat greater than one month ago.  Exam: Breasts in sitting and lying position without visible retractions, dimpling or nipple changes. No palpable nodules or masses. External genitalia is within normal limits, no visible discharge or odor noted. Was able to place a Peterson speculum with lubricant. Bimanual no adnexal fullness or tenderness.  Plan: Vagifem 10 twice weekly vaginally. Reviewed vaginal dilators, declines at this time. Had a normal mammogram June of 2012. Instructed to call if tenderness persists or visible changes to breast. Instructed to use lubricant if becomes sexually active. UA

## 2011-08-26 ENCOUNTER — Ambulatory Visit (INDEPENDENT_AMBULATORY_CARE_PROVIDER_SITE_OTHER): Payer: BC Managed Care – PPO | Admitting: Women's Health

## 2011-08-26 ENCOUNTER — Encounter: Payer: Self-pay | Admitting: Women's Health

## 2011-08-26 ENCOUNTER — Ambulatory Visit (INDEPENDENT_AMBULATORY_CARE_PROVIDER_SITE_OTHER): Payer: BC Managed Care – PPO

## 2011-08-26 DIAGNOSIS — R109 Unspecified abdominal pain: Secondary | ICD-10-CM

## 2011-08-26 DIAGNOSIS — R1030 Lower abdominal pain, unspecified: Secondary | ICD-10-CM

## 2011-08-26 DIAGNOSIS — N898 Other specified noninflammatory disorders of vagina: Secondary | ICD-10-CM

## 2011-08-26 DIAGNOSIS — IMO0001 Reserved for inherently not codable concepts without codable children: Secondary | ICD-10-CM

## 2011-08-26 DIAGNOSIS — R350 Frequency of micturition: Secondary | ICD-10-CM

## 2011-08-26 LAB — URINALYSIS W MICROSCOPIC + REFLEX CULTURE
Bilirubin Urine: NEGATIVE
Casts: NONE SEEN
Crystals: NONE SEEN
Glucose, UA: NEGATIVE mg/dL
Hgb urine dipstick: NEGATIVE
Ketones, ur: NEGATIVE mg/dL
Nitrite: NEGATIVE
Protein, ur: NEGATIVE mg/dL
RBC / HPF: NONE SEEN RBC/hpf (ref <3)
Specific Gravity, Urine: 1.01 (ref 1.005–1.030)
Urobilinogen, UA: 0.2 mg/dL (ref 0.0–1.0)
pH: 5.5 (ref 5.0–8.0)

## 2011-08-26 LAB — WET PREP FOR TRICH, YEAST, CLUE
Clue Cells Wet Prep HPF POC: NONE SEEN
Trich, Wet Prep: NONE SEEN
Yeast Wet Prep HPF POC: NONE SEEN

## 2011-08-26 NOTE — Progress Notes (Signed)
Patient ID: Emma Lopez, female   DOB: 31-Jan-1946, 66 y.o.   MRN: [id number] Presents with a complaint of scant white vaginal discharge and has continued with some low abdominal discomfort on both right and left sides with bloating. Denies any urinary symptoms or constipation or changes in elimination. Will check a test of cure urine. Denies fever.  Exam: External genitalia is within normal limits, speculum exam vaginal cuff intact, scant white discharge. Wet prep negative for yeast or BV. Bimanual no adnexal fullness or tenderness but states had cramping after exam.  Ultrasound to assess ovaries. Ultrasound vaginal cuff is negative. Ovaries appear normal. No free fluid noted.  Reviewed normal ultrasound. Will continue with Vagifem one applicator twice weekly. Call or return if pain persists.

## 2011-08-29 ENCOUNTER — Encounter: Payer: Self-pay | Admitting: Women's Health

## 2011-08-30 LAB — URINE CULTURE
Colony Count: NO GROWTH
Organism ID, Bacteria: NO GROWTH

## 2011-08-31 ENCOUNTER — Telehealth: Payer: Self-pay | Admitting: Women's Health

## 2011-09-05 NOTE — Telephone Encounter (Signed)
Telephone call informed urine culture negative

## 2011-10-10 ENCOUNTER — Other Ambulatory Visit: Payer: Self-pay | Admitting: *Deleted

## 2011-10-10 DIAGNOSIS — M810 Age-related osteoporosis without current pathological fracture: Secondary | ICD-10-CM

## 2011-10-10 MED ORDER — DENOSUMAB 60 MG/ML ~~LOC~~ SOLN
60.0000 mg | Freq: Once | SUBCUTANEOUS | Status: AC
  Administered 2011-10-17: 60 mg via SUBCUTANEOUS

## 2011-10-14 ENCOUNTER — Ambulatory Visit: Payer: BC Managed Care – PPO

## 2011-10-17 ENCOUNTER — Ambulatory Visit (INDEPENDENT_AMBULATORY_CARE_PROVIDER_SITE_OTHER): Payer: BC Managed Care – PPO | Admitting: *Deleted

## 2011-10-17 DIAGNOSIS — M810 Age-related osteoporosis without current pathological fracture: Secondary | ICD-10-CM

## 2011-10-20 ENCOUNTER — Encounter: Payer: Self-pay | Admitting: Obstetrics and Gynecology

## 2012-01-11 ENCOUNTER — Encounter: Payer: BC Managed Care – PPO | Admitting: Obstetrics and Gynecology

## 2012-01-11 ENCOUNTER — Other Ambulatory Visit (HOSPITAL_COMMUNITY)
Admission: RE | Admit: 2012-01-11 | Discharge: 2012-01-11 | Disposition: A | Discharge status: Home / Self Care | Payer: BC Managed Care – PPO | Source: Ambulatory Visit | Attending: Obstetrics and Gynecology | Admitting: Obstetrics and Gynecology

## 2012-01-11 ENCOUNTER — Encounter: Payer: Self-pay | Admitting: Obstetrics and Gynecology

## 2012-01-11 ENCOUNTER — Ambulatory Visit (INDEPENDENT_AMBULATORY_CARE_PROVIDER_SITE_OTHER): Payer: BC Managed Care – PPO | Admitting: Obstetrics and Gynecology

## 2012-01-11 VITALS — BP 134/78 | Ht 62.0 in | Wt 126.0 lb

## 2012-01-11 DIAGNOSIS — E079 Disorder of thyroid, unspecified: Secondary | ICD-10-CM | POA: Insufficient documentation

## 2012-01-11 DIAGNOSIS — R102 Pelvic and perineal pain: Secondary | ICD-10-CM

## 2012-01-11 DIAGNOSIS — I2699 Other pulmonary embolism without acute cor pulmonale: Secondary | ICD-10-CM | POA: Insufficient documentation

## 2012-01-11 DIAGNOSIS — N949 Unspecified condition associated with female genital organs and menstrual cycle: Secondary | ICD-10-CM

## 2012-01-11 DIAGNOSIS — D069 Carcinoma in situ of cervix, unspecified: Secondary | ICD-10-CM

## 2012-01-11 DIAGNOSIS — M810 Age-related osteoporosis without current pathological fracture: Secondary | ICD-10-CM | POA: Insufficient documentation

## 2012-01-11 DIAGNOSIS — Z01419 Encounter for gynecological examination (general) (routine) without abnormal findings: Secondary | ICD-10-CM | POA: Insufficient documentation

## 2012-01-11 DIAGNOSIS — M797 Fibromyalgia: Secondary | ICD-10-CM | POA: Insufficient documentation

## 2012-01-11 DIAGNOSIS — M1990 Unspecified osteoarthritis, unspecified site: Secondary | ICD-10-CM | POA: Insufficient documentation

## 2012-01-11 DIAGNOSIS — N898 Other specified noninflammatory disorders of vagina: Secondary | ICD-10-CM

## 2012-01-11 DIAGNOSIS — N952 Postmenopausal atrophic vaginitis: Secondary | ICD-10-CM

## 2012-01-11 DIAGNOSIS — I2782 Chronic pulmonary embolism: Secondary | ICD-10-CM | POA: Insufficient documentation

## 2012-01-11 LAB — WET PREP FOR TRICH, YEAST, CLUE
Clue Cells Wet Prep HPF POC: NONE SEEN
Trich, Wet Prep: NONE SEEN
Yeast Wet Prep HPF POC: NONE SEEN

## 2012-01-11 MED ORDER — ESTRADIOL 10 MCG VA TABS: ORAL_TABLET | VAGINAL | Status: DC

## 2012-01-11 NOTE — Progress Notes (Signed)
Patient came to see me today for her annual GYN exam. She is now had 2 injections of Prolia. Her last bone density showed improvement of her bones. She has had no fractures. She continues to use Vagifem for atrophic vaginitis with good results. She uses Valium on a when necessary basis. She is up-to-date on mammograms. She is having no vaginal bleeding. She is having no pelvic pain. 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 She is status post TAH done partially because of the CIN-3. She is having some vulvar and vaginal irritation and wonders if she doesn't have a yeast infection. In January she said her Netha for lower abdominal discomfort. Ultrasound showed normal ovaries. She is now having more abdominal pain slightly below the umbilicus. She says it feels like a fist tightening. It is now eased. She had been irregular and after several bowel movements the pain was better. She is suspicious that she has irritable bowel syndrome.  HEENT: Within normal limits. Kennon Portela present. Neck: No masses. Supraclavicular lymph nodes: Not enlarged. Breasts: Examined in both sitting and lying position. Symmetrical without skin changes or masses. Abdomen: Soft no masses guarding or rebound. No hernias. Pelvic: External within normal limits. BUS within normal limits. Vaginal examination shows good estrogen effect, no cystocele enterocele or rectocele. Wet prep negative. Cervix and uterus absent. Adnexa within normal limits. Rectovaginal confirmatory. Extremities within normal limits.  Assessment: #1. Atrophic vaginitis #2. CIN-3 #3. Lower abdominal pain. #4. Vulva  Irritation.  Plan: Wet prep was negative and patient will use cream she has at home for the irritation. She will continue Prolia every 6 months. She requested Pap smear understanding  the guidelines for her would be every 3 years until she is 20 years out from her TAH for CIN-3. Continue yearly mammograms. Continue Vagifem. Discussed followup ultrasound  because of pain. Patient will first discuss with gastroenterologist. If diagnosis not made she will return for ultrasound.

## 2012-01-12 LAB — URINALYSIS W MICROSCOPIC + REFLEX CULTURE
Bacteria, UA: NONE SEEN
Bilirubin Urine: NEGATIVE
Casts: NONE SEEN
Crystals: NONE SEEN
Glucose, UA: NEGATIVE mg/dL
Hgb urine dipstick: NEGATIVE
Ketones, ur: NEGATIVE mg/dL
Leukocytes, UA: NEGATIVE
Nitrite: NEGATIVE
Protein, ur: NEGATIVE mg/dL
Specific Gravity, Urine: 1.021 (ref 1.005–1.030)
Squamous Epithelial / LPF: NONE SEEN
Urobilinogen, UA: 0.2 mg/dL (ref 0.0–1.0)
pH: 6.5 (ref 5.0–8.0)

## 2012-02-20 ENCOUNTER — Encounter: Payer: Self-pay | Admitting: *Deleted

## 2012-02-20 NOTE — Progress Notes (Signed)
Patient ID: Emma Lopez, female   DOB: Sep 30, 1945, 66 y.o.   MRN: [id number] Pt called c/o abdominal discomfort still, pt told that G office note asked to follow up with GI doctor then if abdominal pain continues to follow up with G. Pt said okay she will do.

## 2012-02-22 ENCOUNTER — Other Ambulatory Visit: Payer: Self-pay | Admitting: *Deleted

## 2012-02-22 ENCOUNTER — Encounter: Payer: Self-pay | Admitting: *Deleted

## 2012-02-22 NOTE — Progress Notes (Signed)
Patient ID: Emma Lopez, female   DOB: 11/12/1945, 66 y.o.   MRN: [id number] PT ASKED IF OFFICE NOTE COULD BE FAXED TO GI DOCTOR FROM RECENT OV REGARDING ABDOMINAL DISCOMFORT TO DR. [facility] OFFICE AT [place] [phone].

## 2012-05-02 ENCOUNTER — Telehealth: Payer: Self-pay | Admitting: *Deleted

## 2012-05-02 NOTE — Telephone Encounter (Signed)
If over a year I would do one unless she had one elsewhere.

## 2012-05-02 NOTE — Telephone Encounter (Signed)
Pt is due for Prolia. She had her last one in March. Does she need Caclium drawn?. I dont see one in EPIC. PLs advise

## 2012-05-02 NOTE — Telephone Encounter (Signed)
Pt informend will check with endocrinologist about lab work and will let me know. Shot ordered and I will wait for pts return call. KW

## 2012-05-22 NOTE — Telephone Encounter (Signed)
Spoke with pt, she states has nml calcium in Feb 2013. Will proceed with injection. And wants flu shot as well. Apt 05/23/12. KW

## 2012-05-23 ENCOUNTER — Ambulatory Visit (INDEPENDENT_AMBULATORY_CARE_PROVIDER_SITE_OTHER): Payer: BC Managed Care – PPO | Admitting: Gynecology

## 2012-05-23 DIAGNOSIS — Z23 Encounter for immunization: Secondary | ICD-10-CM

## 2012-05-23 DIAGNOSIS — M810 Age-related osteoporosis without current pathological fracture: Secondary | ICD-10-CM

## 2012-05-23 MED ORDER — DENOSUMAB 60 MG/ML ~~LOC~~ SOLN
60.0000 mg | Freq: Once | SUBCUTANEOUS | Status: AC
  Administered 2012-05-23: 60 mg via SUBCUTANEOUS

## 2012-06-14 ENCOUNTER — Other Ambulatory Visit: Payer: Self-pay | Admitting: Women's Health

## 2012-06-14 NOTE — Telephone Encounter (Deleted)
Called into pharmacy

## 2012-06-18 NOTE — Telephone Encounter (Signed)
Called into pharmacy

## 2012-10-24 ENCOUNTER — Encounter: Payer: Self-pay | Admitting: Gynecology

## 2013-02-22 ENCOUNTER — Other Ambulatory Visit: Payer: Self-pay | Admitting: *Deleted

## 2013-02-22 DIAGNOSIS — M810 Age-related osteoporosis without current pathological fracture: Secondary | ICD-10-CM

## 2013-02-26 ENCOUNTER — Encounter: Payer: Self-pay | Admitting: Gynecology

## 2013-02-26 ENCOUNTER — Ambulatory Visit (INDEPENDENT_AMBULATORY_CARE_PROVIDER_SITE_OTHER): Payer: BC Managed Care – PPO | Admitting: Gynecology

## 2013-02-26 VITALS — BP 124/74 | Ht 62.0 in | Wt 130.0 lb

## 2013-02-26 DIAGNOSIS — Z803 Family history of malignant neoplasm of breast: Secondary | ICD-10-CM

## 2013-02-26 DIAGNOSIS — N952 Postmenopausal atrophic vaginitis: Secondary | ICD-10-CM

## 2013-02-26 DIAGNOSIS — M810 Age-related osteoporosis without current pathological fracture: Secondary | ICD-10-CM

## 2013-02-26 DIAGNOSIS — Z01419 Encounter for gynecological examination (general) (routine) without abnormal findings: Secondary | ICD-10-CM

## 2013-02-26 MED ORDER — ALPRAZOLAM 0.25 MG PO TABS: ORAL_TABLET | ORAL | Status: DC

## 2013-02-26 MED ORDER — ESTRADIOL 10 MCG VA TABS: 1.0000 | ORAL_TABLET | VAGINAL | Status: DC

## 2013-02-26 NOTE — Progress Notes (Signed)
Emma Lopez [birth year] [id number]        67 y.o.  G0P0 for annual exam.  Former patient of Dr. Eda Lopez with several issues noted below.  Past medical history,surgical history, medications, allergies, family history and social history were all reviewed and documented in the EPIC chart.  ROS:  Performed and pertinent positives and negatives are included in the history, assessment and plan .  Exam: Emma Lopez assistant Filed Vitals:   02/26/13 1206  BP: 124/74  Height: 5\' 2"  (1.575 m)  Weight: 130 lb (58.968 kg)   General appearance  Normal Skin grossly normal Head/Neck normal with no cervical or supraclavicular adenopathy thyroid normal Lungs  clear Cardiac RR, without RMG Abdominal  soft, nontender, without masses, organomegaly or hernia Breasts  examined lying and sitting without masses, retractions, discharge or axillary adenopathy. Pelvic  Ext/BUS/vagina  normal with atrophic changes   Adnexa  Without masses or tenderness    Anus and perineum  normal   Rectovaginal  normal sphincter tone without palpated masses or tenderness.    Assessment/Plan:  67 y.o. G0P0 female for annual exam.   1. History of TAH for high-grade cervical dysplasia 2001. Pap smears since have been normal. Pap smear 2013 normal. No Pap smear done today. Plan repeat at 3 year interval. 2. Atrophic vaginal changes. Uses Vagifem with good relief. I reviewed the issues of absorption potential given her history of DVT and anti-coagulation. I also reviewed the whole issue of HRT with her to include the WHI study with increased risk of stroke, heart attack, DVT and breast cancer. Patient is comfortable continuing with the Vagifem and I refilled her x1 year. 3. Family history of breast cancer. Patient's mother in her late 60s as well as 2 maternal aunts and a cousin have all developed postmenopausal breast cancer. Discussed BRCA testing and my recommendation that she should followup for this. Apparently has been receiving  ultrasounds intermittently through Dr. Eda Lopez for ovarian surveillance. I reviewed the issues and lack of evidence that CA 125 and ultrasound screening is of great benefit. She understands a normal test does not mean that she would not develop ovarian cancer. Patient would feel better from a piece of mind standpoint and I ordered an ultrasound for her. The issues as to if she was genetically positive what she would do with this to include prophylactic surgeries or more intensive surveillance such as adding MRI also discussed. At this point the patient declines BRCA testing. We'll continue with her annual mammography I recommended 3-D. SBE monthly also stressed. 4. Anxiety. Uses Xanax 0.25 mg intermittently to help with anxiety. #30 with 2 refills provided. 5. Osteoporosis. DEXA 12/2010 T score -3.3. Patient currently on Prolia. Had used Reclast in the past as was unable to tolerate oral medications. Estimates approximately 4 year treatment regimen. Reports having received 3 Prolia injections. We'll continue on Prolia for now. Has DEXA scheduled today and will followup for this. Increased calcium vitamin D reviewed. 6. Colonoscopy. 2012. Followup with their recommended interval. 7. Health maintenance. Sees Dr. Zachery Lopez routinely. No lab work done today as it is all done through her office.   Note: This document was prepared with digital dictation and possible smart phrase technology. Any transcriptional errors that result from this process are unintentional.   Emma Lords MD, 12:53 PM 02/26/2013

## 2013-02-26 NOTE — Patient Instructions (Signed)
Follow up in one year, sooner as needed. 

## 2013-02-27 LAB — URINALYSIS W MICROSCOPIC + REFLEX CULTURE
Bacteria, UA: NONE SEEN
Bilirubin Urine: NEGATIVE
Casts: NONE SEEN
Crystals: NONE SEEN
Glucose, UA: NEGATIVE mg/dL
Hgb urine dipstick: NEGATIVE
Ketones, ur: NEGATIVE mg/dL
Leukocytes, UA: NEGATIVE
Nitrite: NEGATIVE
Protein, ur: NEGATIVE mg/dL
Specific Gravity, Urine: 1.016 (ref 1.005–1.030)
Squamous Epithelial / LPF: NONE SEEN
Urobilinogen, UA: 0.2 mg/dL (ref 0.0–1.0)
pH: 6 (ref 5.0–8.0)

## 2013-03-05 ENCOUNTER — Other Ambulatory Visit: Payer: Self-pay | Admitting: *Deleted

## 2013-03-05 DIAGNOSIS — M810 Age-related osteoporosis without current pathological fracture: Secondary | ICD-10-CM

## 2013-03-06 ENCOUNTER — Encounter: Payer: Self-pay | Admitting: Gynecology

## 2013-04-15 ENCOUNTER — Other Ambulatory Visit: Payer: BC Managed Care – PPO

## 2013-04-15 ENCOUNTER — Ambulatory Visit: Payer: BC Managed Care – PPO | Admitting: Gynecology

## 2013-04-17 ENCOUNTER — Encounter: Payer: Self-pay | Admitting: Gynecology

## 2013-04-17 ENCOUNTER — Ambulatory Visit (INDEPENDENT_AMBULATORY_CARE_PROVIDER_SITE_OTHER): Payer: Medicare Other | Admitting: Gynecology

## 2013-04-17 ENCOUNTER — Ambulatory Visit (INDEPENDENT_AMBULATORY_CARE_PROVIDER_SITE_OTHER): Payer: Medicare Other

## 2013-04-17 DIAGNOSIS — R140 Abdominal distension (gaseous): Secondary | ICD-10-CM

## 2013-04-17 DIAGNOSIS — R142 Eructation: Secondary | ICD-10-CM

## 2013-04-17 DIAGNOSIS — Z803 Family history of malignant neoplasm of breast: Secondary | ICD-10-CM

## 2013-04-17 DIAGNOSIS — R141 Gas pain: Secondary | ICD-10-CM

## 2013-04-17 DIAGNOSIS — N83339 Acquired atrophy of ovary and fallopian tube, unspecified side: Secondary | ICD-10-CM

## 2013-04-17 NOTE — Patient Instructions (Signed)
Followup with your gastroenterologist about the abdominal bloating.

## 2013-04-17 NOTE — Progress Notes (Signed)
Patient follows up for ultrasound. History of abdominal bloating. Strong family history of breast cancer. Was receiving intermittent ultrasounds by Dr. Eda Paschal for ovarian surveillance. She does note that her bloating seems to be more epigastric now. She is having belching which seems to relieve some of her discomfort. No nausea vomiting diarrhea constipation.  Ultrasound shows left ovary atrophic. Right ovary not visualized but no adnexal pathology. Cul-de-sac negative.  Assessment and plan: Abdominal bloating more upper abdomen. Recommended that she followup with her gastroenterologist for further evaluation. Pelvic ultrasound is negative. Followup from a GYN standpoint routinely

## 2013-07-11 ENCOUNTER — Telehealth: Payer: Self-pay | Admitting: *Deleted

## 2013-07-11 NOTE — Telephone Encounter (Signed)
Pt called is overdue for Prolia, missed the injection earlier this year and wants to continue now. Benefits requested and will call pt with information when I receive it. KW CMA

## 2013-07-31 NOTE — Telephone Encounter (Signed)
Prior Auth needed and is in process. Pt was left a message and Laurel Dimmer will follow up on 08/02/13 in the am to see if approved before patient arrives in office. KW CMA

## 2013-08-01 DIAGNOSIS — K589 Irritable bowel syndrome without diarrhea: Secondary | ICD-10-CM

## 2013-08-01 HISTORY — DX: Irritable bowel syndrome, unspecified: K58.9

## 2013-08-07 ENCOUNTER — Telehealth: Payer: Self-pay | Admitting: *Deleted

## 2013-08-07 NOTE — Telephone Encounter (Signed)
Pt was informed that benefits for Prolia have been obtained - pt pays 20% of her out of pocket $3950. With PA approval by Aurea Graff #[id number] good for a year 08/02/13-08/02/14. Pt informed and coming Monday 1/12 at [place]

## 2013-08-12 ENCOUNTER — Ambulatory Visit (INDEPENDENT_AMBULATORY_CARE_PROVIDER_SITE_OTHER): Payer: Medicare Other | Admitting: *Deleted

## 2013-08-12 DIAGNOSIS — M810 Age-related osteoporosis without current pathological fracture: Secondary | ICD-10-CM

## 2013-08-12 MED ORDER — DENOSUMAB 60 MG/ML ~~LOC~~ SOLN
60.0000 mg | Freq: Once | SUBCUTANEOUS | Status: AC
Start: 2013-08-12 — End: 2013-08-12
  Administered 2013-08-12: 60 mg via SUBCUTANEOUS

## 2013-08-12 NOTE — Telephone Encounter (Signed)
PA received and apt 08/12/13. KW CMA

## 2013-10-25 ENCOUNTER — Encounter: Payer: Self-pay | Admitting: Gynecology

## 2014-01-30 ENCOUNTER — Telehealth: Payer: Self-pay | Admitting: *Deleted

## 2014-01-30 NOTE — Telephone Encounter (Signed)
I called patient due for her Prolia after 02/09/14. PA good til 08/02/14. And insurance covers it 100%. She has an apt 02/27/14 and can get injection then. She will check bloodwork from [facility] and lmk if she needs any bloodwork. KW CMA

## 2014-02-27 ENCOUNTER — Ambulatory Visit (INDEPENDENT_AMBULATORY_CARE_PROVIDER_SITE_OTHER): Payer: Medicare Other | Admitting: Gynecology

## 2014-02-27 ENCOUNTER — Encounter: Payer: Self-pay | Admitting: Gynecology

## 2014-02-27 VITALS — BP 120/74 | Ht 62.0 in | Wt 131.0 lb

## 2014-02-27 DIAGNOSIS — Z803 Family history of malignant neoplasm of breast: Secondary | ICD-10-CM

## 2014-02-27 DIAGNOSIS — M810 Age-related osteoporosis without current pathological fracture: Secondary | ICD-10-CM

## 2014-02-27 DIAGNOSIS — Z1272 Encounter for screening for malignant neoplasm of vagina: Secondary | ICD-10-CM

## 2014-02-27 DIAGNOSIS — N952 Postmenopausal atrophic vaginitis: Secondary | ICD-10-CM

## 2014-02-27 MED ORDER — DENOSUMAB 60 MG/ML ~~LOC~~ SOLN
60.0000 mg | Freq: Once | SUBCUTANEOUS | Status: AC
  Administered 2014-02-27: 60 mg via SUBCUTANEOUS

## 2014-02-27 MED ORDER — ALPRAZOLAM 0.25 MG PO TABS: ORAL_TABLET | ORAL | Status: DC

## 2014-02-27 NOTE — Progress Notes (Signed)
Emma Lopez January 03, 1946 [id number]        67 y.o.  G0P0 for followup exam. Several issues noted below.  Past medical history,surgical history, problem list, medications, allergies, family history and social history were all reviewed and documented as reviewed in the EPIC chart.  ROS:  12 system ROS performed with pertinent positives and negatives included in the history, assessment and plan.   Additional significant findings :  None   Exam: [name] Counsellor Vitals:   02/27/14 1204  BP: 120/74  Height: 5\' 2"  (1.575 m)  Weight: 131 lb (59.421 kg)   General appearance:  Normal affect, orientation and appearance. Skin: Grossly normal HEENT: Without gross lesions.  No cervical or supraclavicular adenopathy. Thyroid normal.  Lungs:  Clear without wheezing, rales or rhonchi Cardiac: RR, without RMG Abdominal:  Soft, nontender, without masses, guarding, rebound, organomegaly or hernia Breasts:  Examined lying and sitting without masses, retractions, discharge or axillary adenopathy. Pelvic:  Ext/BUS/vagina generalized atrophic changes. Pap of cuff done  Adnexa  Without masses or tenderness    Anus and perineum  Normal   Rectovaginal  Normal sphincter tone without palpated masses or tenderness.    Assessment/Plan:  68 y.o. G0P0 female for followup exam.   1. Postmenopausal/atrophic genital changes. Had been on Vagifem 10 mcg twice weekly. Admits to not using. Is having issues with vaginal dryness. States that she's going to go ahead and restart this. Has supply at home but will call if she needs more. We've discussed the issue of possible absorption in her history of DVT and anticoagulation. She is comfortable with this accepting the risk. 2. Family history breast cancer as outlined in the 02/26/2013 note. Mammography 09/2013. Continue with annual mammography. SBE monthly reviewed. Does want to continue with ovarian surveillance with ultrasound. Exam today is normal and she'll schedule an  ultrasound 4-6 months from now. I again reviewed the issues as far as whether this or CA 125 testing is of great benefit as far as catching ovarian cancer early. I again rediscussed genetic testing and reoffered referral to genetic counselor and she declined. 3. Osteoporosis. DEXA 01/2013 with T score -3.2 stable from prior study. Initiated Prolia approximately 2012. Is in her third year. We'll continue for now and plan repeat DEXA next year a 2 year interval. Discussed possible drug-free holiday if she continues to show stability. She does have a history of Reclast the year preceding her Prolia. Increase calcium vitamin D reviewed. Patient received Prolia shot today. 4. Pap smear 2013. Does have history of high grade dysplasia 2000 ultimately leading to TAH 2001. Patient is uncomfortable with less frequent screening and request annual screening." I will pay for this if I have to". Pap smear done today of the vaginal cuff. 5. Anxiety. Patient uses Xanax 0.25 mg intermittently for anxiety. #30 with 2 refills provided. 6. Colonoscopy 2012. Repeat at their recommended interval. 7. Health maintenance. No routine blood work done and she has this done through her primary physician's office. Followup  4-6 months for ultrasound, 6 months for her next Prolia in one year for annual exam.   Note: This document was prepared with digital dictation and possible smart phrase technology. Any transcriptional errors that result from this process are unintentional.   Anastasio Auerbach MD, 12:38 PM 02/27/2014

## 2014-02-27 NOTE — Addendum Note (Signed)
Addended by: Nelva Nay on: 02/27/2014 12:53 PM   Modules accepted: Orders

## 2014-02-27 NOTE — Patient Instructions (Signed)
Followup for ultrasound in 4-6 months as scheduled.  Followup for Prolia in 6 months  Followup in one year for annual exam

## 2014-02-28 LAB — URINALYSIS W MICROSCOPIC + REFLEX CULTURE
Bilirubin Urine: NEGATIVE
Casts: NONE SEEN
Glucose, UA: NEGATIVE mg/dL
Hgb urine dipstick: NEGATIVE
Ketones, ur: NEGATIVE mg/dL
Leukocytes, UA: NEGATIVE
Nitrite: NEGATIVE
Protein, ur: NEGATIVE mg/dL
Specific Gravity, Urine: 1.023 (ref 1.005–1.030)
Squamous Epithelial / LPF: NONE SEEN
Urobilinogen, UA: 0.2 mg/dL (ref 0.0–1.0)
pH: 5 (ref 5.0–8.0)

## 2014-03-01 LAB — URINE CULTURE
Colony Count: NO GROWTH
Organism ID, Bacteria: NO GROWTH

## 2014-03-04 LAB — CYTOLOGY - PAP

## 2014-03-27 ENCOUNTER — Encounter: Payer: Self-pay | Admitting: Gynecology

## 2014-03-27 ENCOUNTER — Ambulatory Visit (INDEPENDENT_AMBULATORY_CARE_PROVIDER_SITE_OTHER): Payer: Medicare Other | Admitting: Gynecology

## 2014-03-27 DIAGNOSIS — R87615 Unsatisfactory cytologic smear of cervix: Secondary | ICD-10-CM

## 2014-03-27 NOTE — Patient Instructions (Signed)
Follow-up for annual exam when due. 

## 2014-03-27 NOTE — Progress Notes (Signed)
Emma Lopez Sep 23, 1945 [id number]        68 y.o.  G0P0 presents for repeat Pap smear. She was in previously and a Pap smear was done which returned insufficient for diagnosis. She does have significant atrophic changes. She is status post TAH 2004 high-grade dysplasia.  Past medical history,surgical history, problem list, medications, allergies, family history and social history were all reviewed and documented in the EPIC chart.  Directed ROS with pertinent positives and negatives documented in the history of present illness/assessment and plan.  Exam:  [name] assistant General appearance:  Normal Pelvic external BUS vagina with significant atrophic changes. Vigorous Pap smear of cuff done.  Assessment/Plan:  68 y.o. G0P0 with Pap smears insufficient diagnoses. Repeat Pap smear done today. Followup for results. If it again returnes insufficient then I will accept this as negative as I did a vigorous Pap smear.   Note: This document was prepared with digital dictation and possible smart phrase technology. Any transcriptional errors that result from this process are unintentional.   Anastasio Auerbach MD, 11:25 AM 03/27/2014

## 2014-03-27 NOTE — Addendum Note (Signed)
Addended by: Nelva Nay on: 03/27/2014 11:31 AM   Modules accepted: Orders

## 2014-03-28 LAB — CYTOLOGY - PAP

## 2014-05-14 ENCOUNTER — Other Ambulatory Visit: Payer: Self-pay | Admitting: Gynecology

## 2014-06-02 ENCOUNTER — Encounter: Payer: Self-pay | Admitting: Gynecology

## 2014-06-02 ENCOUNTER — Other Ambulatory Visit: Payer: Self-pay | Admitting: Gynecology

## 2014-06-02 ENCOUNTER — Ambulatory Visit (INDEPENDENT_AMBULATORY_CARE_PROVIDER_SITE_OTHER): Payer: Medicare Other | Admitting: Gynecology

## 2014-06-02 ENCOUNTER — Ambulatory Visit (INDEPENDENT_AMBULATORY_CARE_PROVIDER_SITE_OTHER): Payer: Medicare Other

## 2014-06-02 DIAGNOSIS — N83319 Acquired atrophy of ovary, unspecified side: Secondary | ICD-10-CM

## 2014-06-02 DIAGNOSIS — K625 Hemorrhage of anus and rectum: Secondary | ICD-10-CM

## 2014-06-02 DIAGNOSIS — Z803 Family history of malignant neoplasm of breast: Secondary | ICD-10-CM

## 2014-06-02 DIAGNOSIS — R198 Other specified symptoms and signs involving the digestive system and abdomen: Secondary | ICD-10-CM

## 2014-06-02 DIAGNOSIS — N8331 Acquired atrophy of ovary: Secondary | ICD-10-CM

## 2014-06-02 DIAGNOSIS — R1900 Intra-abdominal and pelvic swelling, mass and lump, unspecified site: Secondary | ICD-10-CM

## 2014-06-02 DIAGNOSIS — R140 Abdominal distension (gaseous): Secondary | ICD-10-CM

## 2014-06-02 NOTE — Patient Instructions (Signed)
Follow up next year when you're due for your annual exam 

## 2014-06-02 NOTE — Progress Notes (Signed)
Emma Lopez October 10, 1945 [id number]        68 y.o.  G0P0 Presents for ultrasound due to a strong family history of breast cancer and a history of intermittent abdominal bloating for ovarian surveillance. Most recently notes some intermittent rectal bleeding for which she had made an appointment to see her primary physician.  Past medical history,surgical history, problem list, medications, allergies, family history and social history were all reviewed and documented in the EPIC chart.  Directed ROS with pertinent positives and negatives documented in the history of present illness/assessment and plan.  Ultrasound status post hysterectomy. Left ovary atrophic. Right ovary not visualized but right adnexa is negative for pathology. Cul-de-sac is negative.  Assessment/Plan:  68 y.o. G0P0 with history as above. Ultrasound is negative. Patient will follow up when she is due for her annual exam. She does have a history of colon cancer in her family. Relates a sigmoidoscopy several years ago. Due to her rectal bleeding history I did recommend that she see her gastroenterologist. I'm not sure her primary is can add much other than referring her onto the gastroenterologist.     Anastasio Auerbach MD, 11:43 AM 06/02/2014

## 2014-08-05 ENCOUNTER — Telehealth: Payer: Self-pay | Admitting: Gynecology

## 2014-08-05 NOTE — Telephone Encounter (Signed)
Phone call left message regarding 2016 Prolia due 08/31/14. Need to verify insurance coverage for 2016 before we submit to Prolia to check benefits.

## 2014-08-12 NOTE — Telephone Encounter (Signed)
Jena has appointment on Friday 01/15 she will bring new insurance card .

## 2014-08-18 NOTE — Telephone Encounter (Signed)
Phone call to patient regarding need for current Calcium level . Her PCP is with [facility] and she will check on this and call with results. New insurance card received for 2016 checking benefits.Last Prolia was 02/27/14, due after 08/30/14.

## 2014-09-02 NOTE — Telephone Encounter (Signed)
Phone call to pt regarding information from insurance company PA needed, all information sent to insurance on 08/26/14. Will call her back when we receive information. Pt request call cell phone in no answer at home number.

## 2014-09-23 NOTE — Telephone Encounter (Signed)
Phone call to pt Prolia approval received Ref # [id number]  Dates 09/16/2014 thru 08/01/2015 , Amariana will come in for injection on Friday 09/26/2014 at 2:30 for injection. Benefits are $40.00 with OV,  Without OV 100% coverage. Calcium level was 9.0 on 06/05/2014.  Next injection will be due after 03/28/2015.

## 2014-09-25 ENCOUNTER — Other Ambulatory Visit (INDEPENDENT_AMBULATORY_CARE_PROVIDER_SITE_OTHER): Payer: Medicare Other | Admitting: Gynecology

## 2014-09-25 DIAGNOSIS — M810 Age-related osteoporosis without current pathological fracture: Secondary | ICD-10-CM

## 2014-09-25 MED ORDER — DENOSUMAB 60 MG/ML ~~LOC~~ SOLN
60.0000 mg | Freq: Once | SUBCUTANEOUS | Status: AC
  Administered 2014-09-25: 60 mg via SUBCUTANEOUS

## 2014-09-26 ENCOUNTER — Other Ambulatory Visit: Payer: Medicare Other

## 2014-09-29 NOTE — Telephone Encounter (Signed)
Injection Prolia was 09/25/2014.

## 2014-12-04 ENCOUNTER — Encounter: Payer: Self-pay | Admitting: Gynecology

## 2015-03-04 ENCOUNTER — Telehealth: Payer: Self-pay | Admitting: Gynecology

## 2015-03-04 NOTE — Telephone Encounter (Addendum)
Phone call to pt . Prolia due after 03/28/2015. PA approval # [id number] 09/16/14 thru 08/01/15. Talked with Mertie Moores. At [facility] and confirmed approval thru 08/01/15 for Prolia injections.No limited number stated on approval. No deductible , With OV $40 co pay, NO OV no co pay cost to pt $0. OOP max $4200. Left message to call back and schedule after 03/28/2015 nurse visit only for injection. Calcium 9.0 on 06/05/14 at PCP.

## 2015-03-05 ENCOUNTER — Encounter: Payer: Self-pay | Admitting: Women's Health

## 2015-03-05 ENCOUNTER — Other Ambulatory Visit (HOSPITAL_COMMUNITY)
Admission: RE | Admit: 2015-03-05 | Discharge: 2015-03-05 | Disposition: A | Discharge status: Home / Self Care | Payer: Medicare Other | Source: Ambulatory Visit | Attending: Gynecology | Admitting: Gynecology

## 2015-03-05 ENCOUNTER — Ambulatory Visit (INDEPENDENT_AMBULATORY_CARE_PROVIDER_SITE_OTHER): Payer: Medicare Other | Admitting: Women's Health

## 2015-03-05 VITALS — BP 138/76 | Ht 62.0 in | Wt 136.0 lb

## 2015-03-05 DIAGNOSIS — Z124 Encounter for screening for malignant neoplasm of cervix: Secondary | ICD-10-CM | POA: Diagnosis present

## 2015-03-05 DIAGNOSIS — M810 Age-related osteoporosis without current pathological fracture: Secondary | ICD-10-CM | POA: Diagnosis not present

## 2015-03-05 DIAGNOSIS — R140 Abdominal distension (gaseous): Secondary | ICD-10-CM

## 2015-03-05 DIAGNOSIS — Z01419 Encounter for gynecological examination (general) (routine) without abnormal findings: Secondary | ICD-10-CM | POA: Diagnosis not present

## 2015-03-05 MED ORDER — DENOSUMAB 60 MG/ML ~~LOC~~ SOLN: 60.0000 mg | SUBCUTANEOUS | Status: DC

## 2015-03-05 NOTE — Patient Instructions (Signed)
Osteoporosis Throughout your life, your body breaks down old bone and replaces it with new bone. As you get older, your body does not replace bone as quickly as it breaks it down. By the age of 30 years, most people begin to gradually lose bone because of the imbalance between bone loss and replacement. Some people lose more bone than others. Bone loss beyond a specified normal degree is considered osteoporosis.  Osteoporosis affects the strength and durability of your bones. The inside of the ends of your bones and your flat bones, like the bones of your pelvis, look like honeycomb, filled with tiny open spaces. As bone loss occurs, your bones become less dense. This means that the open spaces inside your bones become bigger and the walls between these spaces become thinner. This makes your bones weaker. Bones of a person with osteoporosis can become so weak that they can break (fracture) during minor accidents, such as a simple fall. CAUSES  The following factors have been associated with the development of osteoporosis:  Smoking.  Drinking more than 2 alcoholic drinks several days per week.  Long-term use of certain medicines:  Corticosteroids.  Chemotherapy medicines.  Thyroid medicines.  Antiepileptic medicines.  Gonadal hormone suppression medicine.  Immunosuppression medicine.  Being underweight.  Lack of physical activity.  Lack of exposure to the sun. This can lead to vitamin D deficiency.  Certain medical conditions:  Certain inflammatory bowel diseases, such as Crohn disease and ulcerative colitis.  Diabetes.  Hyperthyroidism.  Hyperparathyroidism. RISK FACTORS Anyone can develop osteoporosis. However, the following factors can increase your risk of developing osteoporosis:  Gender--Women are at higher risk than men.  Age--Being older than 50 years increases your risk.  Ethnicity--White and Asian people have an increased risk.  Weight --Being extremely  underweight can increase your risk of osteoporosis.  Family history of osteoporosis--Having a family member who has developed osteoporosis can increase your risk. SYMPTOMS  Usually, people with osteoporosis have no symptoms.  DIAGNOSIS  Signs during a physical exam that may prompt your caregiver to suspect osteoporosis include:  Decreased height. This is usually caused by the compression of the bones that form your spine (vertebrae) because they have weakened and become fractured.  A curving or rounding of the upper back (kyphosis). To confirm signs of osteoporosis, your caregiver may request a procedure that uses 2 low-dose X-ray beams with different levels of energy to measure your bone mineral density (dual-energy X-ray absorptiometry [DXA]). Also, your caregiver may check your level of vitamin D. TREATMENT  The goal of osteoporosis treatment is to strengthen bones in order to decrease the risk of bone fractures. There are different types of medicines available to help achieve this goal. Some of these medicines work by slowing the processes of bone loss. Some medicines work by increasing bone density. Treatment also involves making sure that your levels of calcium and vitamin D are adequate. PREVENTION  There are things you can do to help prevent osteoporosis. Adequate intake of calcium and vitamin D can help you achieve optimal bone mineral density. Regular exercise can also help, especially resistance and weight-bearing activities. If you smoke, quitting smoking is an important part of osteoporosis prevention. MAKE SURE YOU:  Understand these instructions.  Will watch your condition.  Will get help right away if you are not doing well or get worse. FOR MORE INFORMATION www.osteo.org and www.nof.org Document Released: 04/27/2005 Document Revised: 11/12/2012 Document Reviewed: 07/02/2011 Exit[employer] Patient Information 2015 [employer]. This information is not   intended to replace advice  given to you by your health care provider. Make sure you discuss any questions you have with your health care provider.  

## 2015-03-05 NOTE — Progress Notes (Signed)
Emma Lopez [birth year] [id number]    History:    Presents for breast and pelvic exam. 2001 TAH for HGSIL.  Normal Paps since. History of DES exposure. Normal mammogram history. Mother, 2 maternal aunts breast cancer after menopause, mother 70s, paternal cousin 31 died age 67. Father colon cancer. 2012 Normal colonoscopy. Current on immunizations. 2014 DEXA T score -3.2 stable on Prolia since 2012 had a DEXA today no results at this time. Has had some off and on abdominal bloating, abdominal weight gain most likely from diet, occasional urinary pressure.   Past medical history, past surgical history, family history and social history were all reviewed and documented in the EPIC chart. Lives part-time in [place], sister Emma Lopez having problems with Alzheimer, living at a senior retirement center.  ROS:  A ROS was performed and pertinent positives and negatives are included.  Exam:  Filed Vitals:   03/05/15 1356  BP: 138/76    General appearance:  Normal Thyroid:  Symmetrical, normal in size, without palpable masses or nodularity. Respiratory  Auscultation:  Clear without wheezing or rhonchi Cardiovascular  Auscultation:  Regular rate, without rubs, murmurs or gallops  Edema/varicosities:  Not grossly evident Abdominal  Soft,nontender, without masses, guarding or rebound.  Liver/spleen:  No organomegaly noted  Hernia:  None appreciated  Skin  Inspection:  Grossly normal   Breasts: Examined lying and sitting.     Right: Without masses, retractions, discharge or axillary adenopathy.     Left: Without masses, retractions, discharge or axillary adenopathy. Gentitourinary   Inguinal/mons:  Normal without inguinal adenopathy  External genitalia:  Normal  BUS/Urethra/Skene's glands:  Normal  Vagina: Atrophic Cervix:  And uterus absent  Adnexa/parametria:     Rt: Without masses or tenderness.   Lt: Without masses or tenderness.  Anus and perineum: Normal  Digital rectal  exam: Normal sphincter tone without palpated masses or tenderness  Assessment/Plan:  69 y.o. S WF G0 for breast and pelvic exam.  Abdominal bloating/weight gain/urinary pressure Osteoporosis Prolia since 2012 2001 TAH for HG SIL normal Paps after 2010 DVT Asymptomatic vaginal atrophy Primary care labs  Plan: Ultrasound, will schedule. Continue Prolia 60 mg every 6 months for 5 years, due next month, will schedule. SBE's, continue annual 3-D screening mammogram. Increase regular exercise and decrease calories for weight loss encouraged. BRCA testing reviewed declines at this time. Reviewed Pap, requests annually.  Vaginal lubricants if sexually active encouraged.   Emma Lopez, Emma Lopez [facility], 3:09 PM 03/05/2015

## 2015-03-09 LAB — CYTOLOGY - PAP

## 2015-03-10 ENCOUNTER — Encounter: Payer: Self-pay | Admitting: Gynecology

## 2015-03-11 NOTE — Telephone Encounter (Signed)
VM from pt, Calcium level 9.0 06/05/14 at PCP documentation on [place] site for blood work, Prolia injection schedule on 04/01/15 with u/s exam and Scherrie Gerlach NP visit. Benefits have been discussed with patient.

## 2015-04-01 ENCOUNTER — Encounter: Payer: Self-pay | Admitting: Women's Health

## 2015-04-01 ENCOUNTER — Ambulatory Visit (INDEPENDENT_AMBULATORY_CARE_PROVIDER_SITE_OTHER): Payer: Medicare Other

## 2015-04-01 ENCOUNTER — Ambulatory Visit (INDEPENDENT_AMBULATORY_CARE_PROVIDER_SITE_OTHER): Payer: Medicare Other | Admitting: Women's Health

## 2015-04-01 ENCOUNTER — Other Ambulatory Visit: Payer: Self-pay | Admitting: Women's Health

## 2015-04-01 VITALS — Ht 62.0 in | Wt 136.0 lb

## 2015-04-01 DIAGNOSIS — N8331 Acquired atrophy of ovary: Secondary | ICD-10-CM | POA: Diagnosis not present

## 2015-04-01 DIAGNOSIS — M810 Age-related osteoporosis without current pathological fracture: Secondary | ICD-10-CM

## 2015-04-01 DIAGNOSIS — Z8041 Family history of malignant neoplasm of ovary: Secondary | ICD-10-CM | POA: Diagnosis not present

## 2015-04-01 DIAGNOSIS — R140 Abdominal distension (gaseous): Secondary | ICD-10-CM

## 2015-04-01 DIAGNOSIS — N83319 Acquired atrophy of ovary, unspecified side: Secondary | ICD-10-CM

## 2015-04-01 MED ORDER — DENOSUMAB 60 MG/ML ~~LOC~~ SOLN
60.0000 mg | Freq: Once | SUBCUTANEOUS | Status: AC
  Administered 2015-04-01: 60 mg via SUBCUTANEOUS

## 2015-04-01 NOTE — Progress Notes (Signed)
Patient ID: Emma Lopez, female   DOB: 1946/05/31, 69 y.o.   MRN: [id number] Presents for ultrasound. History of bloating, lower abdominal pressure. Hysterectomy on no HRT.  Exam: Appears well. Ultrasound: S/P hysterectomy, T/V vaginal cuff normal. Right and left ovary normal echo, atrophy. Negative cul-de-sac. No apparent mass seen in right or left adnexal.  Normal GYN ultrasound  Plan: Reviewed normality of ultrasound. Will continue probiotics, call if changes.

## 2015-04-07 NOTE — Telephone Encounter (Signed)
Injection on 04/01/2015. Next injection due after 10/02/15. All labs are thru her PCP.

## 2015-09-15 ENCOUNTER — Telehealth: Payer: Self-pay | Admitting: Gynecology

## 2015-09-15 DIAGNOSIS — M810 Age-related osteoporosis without current pathological fracture: Secondary | ICD-10-CM

## 2015-09-15 NOTE — Telephone Encounter (Signed)
prolia benefits  No deductible, co -ins 20%  approx $420, OOPM $5500 (0 met), with OV $40 co pay. Need PA , Calcium level PCP

## 2015-10-02 NOTE — Telephone Encounter (Signed)
pc to pt waiting on PA.

## 2015-10-07 NOTE — Telephone Encounter (Signed)
Prolia approved PA ref # [id number] one injection (6 month only), Emma Lopez needs Calcium level she will have 10/09/15 at PCP. She will call back and schedule complete exam and Prolia on same day visit with [place].    Pt cost for Prolia $ 420 with OV additional co pay $40   Total $460 pt understands approx cost.

## 2015-10-13 NOTE — Telephone Encounter (Addendum)
PCP did not provide Calcium level. I will place order and inform [place] to check for any additional labs prior to injection and OV for complete exam with [place].

## 2015-10-13 NOTE — Telephone Encounter (Signed)
thanks

## 2015-11-18 ENCOUNTER — Other Ambulatory Visit: Payer: Medicare Other

## 2015-11-18 DIAGNOSIS — M810 Age-related osteoporosis without current pathological fracture: Secondary | ICD-10-CM

## 2015-11-18 LAB — CALCIUM: Calcium: 8.1 mg/dL — ABNORMAL LOW (ref 8.6–10.4)

## 2015-11-18 NOTE — Telephone Encounter (Signed)
Falonda had blood work today . She also scheduled Prolia injection 11/23/2015. Will check her Calcium level to ok Prolia injection .

## 2015-11-19 ENCOUNTER — Other Ambulatory Visit: Payer: Self-pay | Admitting: Women's Health

## 2015-11-19 DIAGNOSIS — R7989 Other specified abnormal findings of blood chemistry: Secondary | ICD-10-CM

## 2015-11-19 NOTE — Telephone Encounter (Addendum)
Calcium level 8.1  N. Young will let me know what to do about Prolia injection.   Telephone call, reviewed importance of increasing calcium rich foods in diet, unable to eat dark green leafy vegetables/on Coumadin. Will increase dairy products, calcium supplement and recheck in 3-4 weeks. Will hold off on Prolia (sched 11/23/15) until after next calcium checked. Will call back to schedule lab appointment.  Note from Elon Alas, NP

## 2015-11-23 ENCOUNTER — Ambulatory Visit: Payer: Medicare Other

## 2015-11-30 ENCOUNTER — Telehealth: Payer: Self-pay | Admitting: Women's Health

## 2015-11-30 NOTE — Telephone Encounter (Signed)
Telephone call for follow-up states has lab appointment for repeat calcium has increased calcium in her diet last calcium 8.1, Prolia was held until calcium level in the normal range.

## 2015-12-04 ENCOUNTER — Encounter (HOSPITAL_COMMUNITY): Payer: Self-pay | Admitting: *Deleted

## 2015-12-04 ENCOUNTER — Other Ambulatory Visit: Payer: Self-pay | Admitting: Gastroenterology

## 2015-12-07 ENCOUNTER — Encounter (HOSPITAL_COMMUNITY): Payer: Self-pay

## 2015-12-07 ENCOUNTER — Encounter (HOSPITAL_COMMUNITY)
Admission: RE | Disposition: A | Discharge status: Home / Self Care | Payer: Self-pay | Source: Ambulatory Visit | Attending: Gastroenterology

## 2015-12-07 ENCOUNTER — Ambulatory Visit (HOSPITAL_COMMUNITY): Payer: Medicare Other | Admitting: Certified Registered"

## 2015-12-07 ENCOUNTER — Ambulatory Visit (HOSPITAL_COMMUNITY)
Admission: RE | Admit: 2015-12-07 | Discharge: 2015-12-07 | Disposition: A | Discharge status: Home / Self Care | Payer: Medicare Other | Source: Ambulatory Visit | Attending: Gastroenterology | Admitting: Gastroenterology

## 2015-12-07 DIAGNOSIS — Z86718 Personal history of other venous thrombosis and embolism: Secondary | ICD-10-CM | POA: Diagnosis not present

## 2015-12-07 DIAGNOSIS — R05 Cough: Secondary | ICD-10-CM | POA: Insufficient documentation

## 2015-12-07 DIAGNOSIS — D124 Benign neoplasm of descending colon: Secondary | ICD-10-CM | POA: Insufficient documentation

## 2015-12-07 DIAGNOSIS — Z86711 Personal history of pulmonary embolism: Secondary | ICD-10-CM | POA: Diagnosis not present

## 2015-12-07 DIAGNOSIS — K219 Gastro-esophageal reflux disease without esophagitis: Secondary | ICD-10-CM | POA: Insufficient documentation

## 2015-12-07 DIAGNOSIS — K317 Polyp of stomach and duodenum: Secondary | ICD-10-CM | POA: Insufficient documentation

## 2015-12-07 DIAGNOSIS — Z87891 Personal history of nicotine dependence: Secondary | ICD-10-CM | POA: Insufficient documentation

## 2015-12-07 DIAGNOSIS — K921 Melena: Secondary | ICD-10-CM | POA: Diagnosis not present

## 2015-12-07 DIAGNOSIS — Z7901 Long term (current) use of anticoagulants: Secondary | ICD-10-CM | POA: Diagnosis not present

## 2015-12-07 HISTORY — DX: Mixed hyperlipidemia: E78.2

## 2015-12-07 HISTORY — DX: Gastro-esophageal reflux disease without esophagitis: K21.9

## 2015-12-07 HISTORY — DX: Major depressive disorder, single episode, unspecified: F32.9

## 2015-12-07 HISTORY — DX: Anxiety disorder, unspecified: F41.9

## 2015-12-07 HISTORY — DX: Other complications of anesthesia, initial encounter: T88.59XA

## 2015-12-07 HISTORY — DX: Other specified postprocedural states: Z98.890

## 2015-12-07 HISTORY — DX: Restless legs syndrome: G25.81

## 2015-12-07 HISTORY — DX: Depression, unspecified: F32.A

## 2015-12-07 HISTORY — DX: Adverse effect of unspecified anesthetic, initial encounter: T41.45XA

## 2015-12-07 HISTORY — PX: ESOPHAGOGASTRODUODENOSCOPY (EGD) WITH PROPOFOL: SHX5813

## 2015-12-07 HISTORY — DX: Nausea with vomiting, unspecified: R11.2

## 2015-12-07 HISTORY — DX: Personal history of other diseases of the digestive system: Z87.19

## 2015-12-07 HISTORY — PX: COLONOSCOPY WITH PROPOFOL: SHX5780

## 2015-12-07 SURGERY — COLONOSCOPY WITH PROPOFOL
Anesthesia: Monitor Anesthesia Care

## 2015-12-07 MED ORDER — LACTATED RINGERS IV SOLN
INTRAVENOUS | Status: DC
  Administered 2015-12-07: 1000 mL via INTRAVENOUS

## 2015-12-07 MED ORDER — SODIUM CHLORIDE 0.9 % IV SOLN: INTRAVENOUS | Status: DC

## 2015-12-07 MED ORDER — LIDOCAINE HCL (PF) 2 % IJ SOLN
INTRAMUSCULAR | Status: DC | PRN
  Administered 2015-12-07: 30 mg via INTRADERMAL

## 2015-12-07 MED ORDER — LIDOCAINE HCL (CARDIAC) 20 MG/ML IV SOLN
INTRAVENOUS | Status: AC
  Filled 2015-12-07: qty 5

## 2015-12-07 MED ORDER — PROPOFOL 10 MG/ML IV BOLUS
INTRAVENOUS | Status: AC
  Filled 2015-12-07: qty 20

## 2015-12-07 MED ORDER — ONDANSETRON HCL 4 MG/2ML IJ SOLN
INTRAMUSCULAR | Status: DC | PRN
  Administered 2015-12-07: 4 mg via INTRAVENOUS

## 2015-12-07 MED ORDER — ONDANSETRON HCL 4 MG/2ML IJ SOLN
INTRAMUSCULAR | Status: AC
  Filled 2015-12-07: qty 2

## 2015-12-07 MED ORDER — PROPOFOL 10 MG/ML IV BOLUS
INTRAVENOUS | Status: DC | PRN
  Administered 2015-12-07: 30 mg via INTRAVENOUS
  Administered 2015-12-07: 40 mg via INTRAVENOUS
  Administered 2015-12-07: 100 mg via INTRAVENOUS
  Administered 2015-12-07: 50 mg via INTRAVENOUS
  Administered 2015-12-07: 20 mg via INTRAVENOUS
  Administered 2015-12-07 (×2): 30 mg via INTRAVENOUS
  Administered 2015-12-07: 20 mg via INTRAVENOUS
  Administered 2015-12-07: 30 mg via INTRAVENOUS

## 2015-12-07 SURGICAL SUPPLY — 22 items

## 2015-12-07 NOTE — Op Note (Signed)
[facility] Patient Name: Emma Lopez Procedure Date: 12/07/2015 MRN: [id number] Attending MD: Garlan Fair , MD Date of Birth: 1946-06-16 CSN: [id number] Age: 70 Admit Type: Outpatient Procedure:                Colonoscopy Indications:              Hematochezia Providers:                Garlan Fair, MD, Malka So, RN, Alfonso Patten, Technician, Lajuana Carry, CRNA Referring MD:              Medicines:                Propofol per Anesthesia Complications:            No immediate complications. Estimated Blood Loss:     Estimated blood loss: none. Procedure:                Pre-Anesthesia Assessment:                           - Prior to the procedure, a History and Physical                            was performed, and patient medications and                            allergies were reviewed. The patient's tolerance of                            previous anesthesia was also reviewed. The risks                            and benefits of the procedure and the sedation                            options and risks were discussed with the patient.                            All questions were answered, and informed consent                            was obtained. Prior Anticoagulants: The patient has                            taken Coumadin (warfarin), last dose was 5 days                            prior to procedure. [ASA Grade]. After reviewing                            the risks and benefits, the patient was deemed in  satisfactory condition to undergo the procedure.                           - Prior to the procedure, a History and Physical                            was performed, and patient medications and                            allergies were reviewed. The patient's tolerance of                            previous anesthesia was also reviewed. The risks                            and benefits of  the procedure and the sedation                            options and risks were discussed with the patient.                            All questions were answered, and informed consent                            was obtained. Prior Anticoagulants: The patient has                            taken Lovenox (enoxaparin), last dose was 1 day                            prior to procedure. ASA Grade Assessment: II - A                            patient with mild systemic disease. After reviewing                            the risks and benefits, the patient was deemed in                            satisfactory condition to undergo the procedure.                           After obtaining informed consent, the colonoscope                            was passed under direct vision. Throughout the                            procedure, the patient's blood pressure, pulse, and                            oxygen saturations were monitored continuously. The  [id number] scope was introduced through                            the anus and advanced to the the cecum, identified                            by appendiceal orifice and ileocecal valve. The                            colonoscopy was performed without difficulty. The                            patient tolerated the procedure well. The quality                            of the bowel preparation was good. The terminal                            ileum, the ileocecal valve, the appendiceal orifice                            and the rectum were photographed. Scope In: 1:13:32 PM Scope Out: 1:46:09 PM Scope Withdrawal Time: 0 hours 22 minutes 57 seconds  Total Procedure Duration: 0 hours 32 minutes 37 seconds  Findings:      The perianal and digital rectal examinations were normal.      A 10 mm polyp was found in the proximal descending colon. The polyp was       sessile. The polyp was removed with a piecemeal technique  using a hot       snare. Resection and retrieval were complete. Two endoclips were applied       to the polypectomy site to prevent bleeding.      The lip of the ileocecal vavle was biopsied to R/O neoplastic tissue.       The exam was otherwise without abnormality. Impression:               - One 10 mm polyp in the proximal descending colon,                            removed piecemeal using a hot snare. Resected and                            retrieved.                           - The examination was otherwise normal. Moderate Sedation:      N/A- Per Anesthesia Care Recommendation:           - Patient has a contact number available for                            emergencies. The signs and symptoms of potential                            delayed complications were discussed with the  patient. Return to normal activities tomorrow.                            Written discharge instructions were provided to the                            patient.                           - Repeat colonoscopy date to be determined after                            pending pathology results are reviewed for                            surveillance.                           - Resume previous diet.                           - Continue present medications. Procedure Code(s):        --- Professional ---                           [phone], Colonoscopy, flexible; with removal of                            tumor(s), polyp(s), or other lesion(s) by snare                            technique Diagnosis Code(s):        --- Professional ---                           D12.4, Benign neoplasm of descending colon                           K92.1, Melena (includes Hematochezia) CPT copyright 2016 American Medical Association. All rights reserved. The codes documented in this report are preliminary and upon coder review may  be revised to meet current compliance requirements. Earle Gell, MD Garlan Fair, MD 12/07/2015 2:02:17 PM This report has been signed electronically. Number of Addenda: 0

## 2015-12-07 NOTE — Anesthesia Postprocedure Evaluation (Signed)
Anesthesia Post Note  Patient: Emma Lopez  Procedure(s) Performed: Procedure(s) (LRB): COLONOSCOPY WITH PROPOFOL (N/A) ESOPHAGOGASTRODUODENOSCOPY (EGD) WITH PROPOFOL (N/A)  Patient location during evaluation: PACU Anesthesia Type: MAC Level of consciousness: awake and alert Pain management: pain level controlled Vital Signs Assessment: post-procedure vital signs reviewed and stable Respiratory status: spontaneous breathing, nonlabored ventilation, respiratory function stable and patient connected to nasal cannula oxygen Cardiovascular status: stable and blood pressure returned to baseline Anesthetic complications: no    Last Vitals:  Filed Vitals:   12/07/15 1205 12/07/15 1350  BP: 147/47 117/55  Pulse: 59 68  Temp: 36.6 C   Resp: 11 21    Last Pain: There were no vitals filed for this visit.               [place]

## 2015-12-07 NOTE — Transfer of Care (Signed)
Immediate Anesthesia Transfer of Care Note  Patient: Emma Lopez  Procedure(s) Performed: Procedure(s): COLONOSCOPY WITH PROPOFOL (N/A) ESOPHAGOGASTRODUODENOSCOPY (EGD) WITH PROPOFOL (N/A)  Patient Location: PACU  Anesthesia Type:MAC  Level of Consciousness:  sedated, patient cooperative and responds to stimulation  Airway & Oxygen Therapy:Patient Spontanous Breathing   Post-op Assessment:  Report given to PACU RN and Post -op Vital signs reviewed and stable  Post vital signs:  Reviewed and stable  Last Vitals:  Filed Vitals:   12/07/15 1205  BP: 147/47  Pulse: 59  Temp: 36.6 C  Resp: 11    Complications: No apparent anesthesia complications

## 2015-12-07 NOTE — Op Note (Signed)
[facility] Patient Name: Emma Lopez Procedure Date: 12/07/2015 MRN: [id number] Attending MD: Garlan Fair , MD Date of Birth: 22-Mar-1946 CSN: [id number] Age: 70 Admit Type: Outpatient Procedure:                Upper GI endoscopy Indications:              Gastro-esophageal reflux disease with chronic dry                            cough. Providers:                Garlan Fair, MD, Alinda Deem, RN, Alfonso Patten, Technician, Lajuana Carry, CRNA Referring MD:              Medicines:                Propofol per Anesthesia Complications:            No immediate complications. Estimated Blood Loss:     Estimated blood loss: none. Procedure:                Pre-Anesthesia Assessment:                           - Prior to the procedure, a History and Physical                            was performed, and patient medications and                            allergies were reviewed. The patient's tolerance of                            previous anesthesia was also reviewed. The risks                            and benefits of the procedure and the sedation                            options and risks were discussed with the patient.                            All questions were answered, and informed consent                            was obtained. Prior Anticoagulants: The patient has                            taken Coumadin (warfarin), last dose was 5 days                            prior to procedure. [ASA Grade]. After reviewing  the risks and benefits, the patient was deemed in                            satisfactory condition to undergo the procedure.                           - Prior to the procedure, a History and Physical                            was performed, and patient medications and                            allergies were reviewed. The patient's tolerance of                            previous  anesthesia was also reviewed. The risks                            and benefits of the procedure and the sedation                            options and risks were discussed with the patient.                            All questions were answered, and informed consent                            was obtained. Prior Anticoagulants: The patient has                            taken Lovenox (enoxaparin), last dose was 1 day                            prior to procedure. ASA Grade Assessment: II - A                            patient with mild systemic disease. After reviewing                            the risks and benefits, the patient was deemed in                            satisfactory condition to undergo the procedure.                           After obtaining informed consent, the endoscope was                            passed under direct vision. Throughout the                            procedure, the patient's blood pressure, pulse, and  oxygen saturations were monitored continuously. The                            [id number]) scope was introduced through the                            mouth, and advanced to the second part of duodenum.                            The upper GI endoscopy was accomplished without                            difficulty. The patient tolerated the procedure                            well. Scope In: Scope Out: Findings:      The Z-line was regular and was found 35 cm from the incisors. No Barrett       appearing mucosa was present.      The examined esophagus was normal.      A single 5 mm sessile polyp with no bleeding and no stigmata of recent       bleeding was found on the greater curvature of the stomach. The polyp       was removed with a cold biopsy forceps. Resection and retrieval were       complete.      The examined duodenum was normal. Impression:               - Z-line regular, 35 cm from the incisors.                            - Normal esophagus.                           - A single gastric polyp. Resected and retrieved.                           - Normal examined duodenum. Moderate Sedation:      N/A- Per Anesthesia Care Recommendation:           - Await pathology results.                           - Patient has a contact number available for                            emergencies. The signs and symptoms of potential                            delayed complications were discussed with the                            patient. Return to normal activities tomorrow.                            Written discharge instructions were provided to the  patient.                           - Resume previous diet.                           - Continue present medications. Procedure Code(s):        --- Professional ---                           [phone], Esophagogastroduodenoscopy, flexible,                            transoral; with biopsy, single or multiple Diagnosis Code(s):        --- Professional ---                           K31.7, Polyp of stomach and duodenum                           K21.9, Gastro-esophageal reflux disease without                            esophagitis CPT copyright 2016 American Medical Association. All rights reserved. The codes documented in this report are preliminary and upon coder review may  be revised to meet current compliance requirements. Earle Gell, MD Garlan Fair, MD 12/07/2015 1:55:16 PM This report has been signed electronically. Number of Addenda: 0

## 2015-12-07 NOTE — Anesthesia Preprocedure Evaluation (Signed)
Anesthesia Evaluation  Patient identified by MRN, date of birth, ID band Patient awake    Reviewed: Allergy & Precautions, NPO status , Patient's Chart, lab work & pertinent test results  History of Anesthesia Complications (+) PONV  Airway Mallampati: I  TM Distance: >3 FB Neck ROM: Full    Dental   Pulmonary former smoker,    Pulmonary exam normal        Cardiovascular Normal cardiovascular exam     Neuro/Psych Anxiety Depression    GI/Hepatic GERD  Medicated and Controlled,  Endo/Other    Renal/GU      Musculoskeletal   Abdominal   Peds  Hematology   Anesthesia Other Findings   Reproductive/Obstetrics                             Anesthesia Physical Anesthesia Plan  ASA: III  Anesthesia Plan: MAC   Post-op Pain Management:    Induction: Intravenous  Airway Management Planned: Natural Airway  Additional Equipment:   Intra-op Plan:   Post-operative Plan:   Informed Consent: I have reviewed the patients History and Physical, chart, labs and discussed the procedure including the risks, benefits and alternatives for the proposed anesthesia with the patient or authorized representative who has indicated his/her understanding and acceptance.     Plan Discussed with: CRNA and Surgeon  Anesthesia Plan Comments:         Anesthesia Quick Evaluation

## 2015-12-07 NOTE — Discharge Instructions (Signed)
Colonoscopy A colonoscopy is an exam to look at your colon. This exam can help find lumps (tumors), growths (polyps), bleeding, and redness and puffiness (inflammation) in your colon.  BEFORE THE PROCEDURE  Ask your doctor about changing or stopping your regular medicines.  You may need to drink a large amount of a special liquid (oral bowel prep). You start drinking this the day before your procedure. It will cause you to have watery poop (stool). This cleans out your colon.  Do not eat or drink anything else once you have started the bowel prep, unless your doctor tells you it is safe to do so.  Make plans for someone to drive you home after the procedure. PROCEDURE  You will be given medicine to help you relax (sedative).  You will lie on your side with your knees bent.  A tube with a camera on the end is put in the opening of your butt (anus) and into your colon. Pictures are sent to a computer screen. Your doctor will look for anything that is not normal.  Your doctor may take a tissue sample (biopsy) from your colon to be looked at more closely.  The exam is finished when your doctor has viewed all of the colon. AFTER THE PROCEDURE  Do not drive for 24 hours after the exam.  You may have a small amount of blood in your poop. This is normal.  You may pass gas and have belly (abdominal) cramps. This is normal.  Ask when your test results will be ready. Make sure you get your test results.   This information is not intended to replace advice given to you by your health care provider. Make sure you discuss any questions you have with your health care provider.   Document Released: 08/20/2010 Document Revised: 07/23/2013 Document Reviewed: 03/25/2013 Elsevier Interactive Patient Education 2016 [place] After Refer to this sheet in the next few weeks. These instructions provide you with information on caring for yourself after your procedure. Your  caregiver may also give you more specific instructions. Your treatment has been planned according to current medical practices, but problems sometimes occur. Call your caregiver if you have any problems or questions after your procedure. HOME CARE INSTRUCTIONS   Know how to care for your temporary feeding tube. You will be given instructions on how to use it before you leave the hospital. You will have one for about 2-3 weeks. The feeding tube will be removed when you are able to take an oral diet that is sufficient. A home health nurse may come to your home to supervise your use of the tube.  Take all medicines as directed.  Do not use over-the-counter medicines, vitamins, or herbal supplements without your caregiver's approval.  Do not drive while you are using narcotic pain medicines or medicines for nausea.  Follow the diet prescribed by your caregiver.  Follow your caregiver's recommendations for activity level, amount of weight you can lift, and time frame for return to work. These recommendations will depend on the type of surgery you had and your general health.  Care for your surgical cuts (incisions) and bandages (dressings) as directed by your caregiver.  Check with your surgeon before you fly on a plane. Air travel may be allowed 1 week after surgery.  You may shower unless instructed otherwise. Do not scrub the incisions. Pat them dry. The use of your arms overhead to wash your hair may cause fatigue, shortness of breath, or  pain. You may need someone to help you.  No baths, hot tubs, or swimming until your caregiver approves.  Check with your surgeon before resuming sexual activity. Sexual activity may be restricted for 4-6 weeks after surgery to allow the incisions to heal adequately.  Keep all follow-up appointments as directed by your caregiver. SEEK MEDICAL CARE IF:   You have increased pain that does not improve with medicine.  You have trouble swallowing.  You have  heartburn or indigestion.  You feel nauseous or vomit.  You cannot have a bowel movement (constipated) or have diarrhea.  You have a rash.  You have burning with urination, or need to urinate more frequently than usual.  You have a new cough. SEEK IMMEDIATE MEDICAL CARE IF:   You have a fever or persistent symptoms for more than 2-3 days.  You have a fever and your symptoms suddenly get worse.  You have chills.  You have chest pain.  You have shortness of breath.  Your dressing on your incision becomes soaked with blood.  Your incision becomes red, opens up, or has a creamy or bad smelling discharge.  You have a severe increase in pain.  You have pain, tenderness, or redness in your calf.   This information is not intended to replace advice given to you by your health care provider. Make sure you discuss any questions you have with your health care provider.   Document Released: 01/17/2012 Document Revised: 05/08/2013 Document Reviewed: 01/17/2012 Elsevier Interactive Patient Education [employer].

## 2015-12-07 NOTE — H&P (Signed)
  Problems: Painless hematochezia (resolved) and chronic gastroesophageal reflux with chronic cough. Hemoglobin 13.7 g. Chronic Coumadin anticoagulation to prevent recurrent pulmonary embolism. 04/27/2010 normal surveillance colonoscopy was performed. 04/11/2012 normal diagnostic flexible proctosigmoidoscopy was performed to evaluate hematochezia.  History: The patient is a 70 year old female born 25-Oct-1945. To prevent recurrent deep venous thrombophlebitis with pulmonary emboli, the patient is on lifelong Coumadin anticoagulation therapy. Over the past few weeks she has had a couple of episodes of small-volume painless hematochezia associated with a normal hemoglobin. She also has chronic gastroesophageal reflux associated with a chronic cough.  She stopped taking Coumadin 5 days ago and was prescribed a subcutaneous Lovenox bridge. Her last dose of Lovenox was 24 hours ago.  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 Lifelong Coumadin anticoagulation therapy. Osteoporosis. Hypercholesterolemia. Multinodular goiter. Fibromyalgia syndrome. Gastroesophageal reflux. Insomnia with periodic limb movement disorder. Anxiety with depression. Exploratory laparoscopy for abdominal pain. Appendectomy. Myomectomy.  Medication allergies: Sulfa caused rash  Exam: The patient is alert and lying comfortably on the endoscopy stretcher. Abdomen is soft and nontender to palpation. Lungs are clear to auscultation. Cardiac exam reveals a regular rhythm.  Plan: Proceed with diagnostic colonoscopy and esophagogastroduodenoscopy

## 2015-12-08 ENCOUNTER — Encounter (HOSPITAL_COMMUNITY): Payer: Self-pay | Admitting: Gastroenterology

## 2015-12-21 ENCOUNTER — Other Ambulatory Visit: Payer: Medicare Other

## 2015-12-21 DIAGNOSIS — R7989 Other specified abnormal findings of blood chemistry: Secondary | ICD-10-CM

## 2015-12-21 LAB — CALCIUM: Calcium: 8.7 mg/dL (ref 8.6–10.4)

## 2015-12-23 NOTE — Telephone Encounter (Signed)
Please call and inform calcium level is now normal, can proceed with prolia  Note from [place]. Talked with Emma Lopez and she will schedule complete exam and Prolia at same appointment with Elon Alas. She will call back to schedule. Calcium level  8.7 on 12/21/15  . Patient cost for Prolia 20% ($420) plus $40 OV charge so she will have complete exam at same time.

## 2015-12-25 ENCOUNTER — Ambulatory Visit (INDEPENDENT_AMBULATORY_CARE_PROVIDER_SITE_OTHER): Payer: Medicare Other | Admitting: *Deleted

## 2015-12-25 DIAGNOSIS — M810 Age-related osteoporosis without current pathological fracture: Secondary | ICD-10-CM | POA: Diagnosis not present

## 2015-12-25 MED ORDER — DENOSUMAB 60 MG/ML ~~LOC~~ SOLN
60.0000 mg | Freq: Once | SUBCUTANEOUS | Status: AC
  Administered 2015-12-25: 60 mg via SUBCUTANEOUS

## 2015-12-31 NOTE — Telephone Encounter (Signed)
Prolia given 12/25/15  Calcium level 8.7 on 12/21/15  Will schedule complete exam with N Young at a later date. Next Prolia injection due after 06/27/16. I will check with N Young to check about Prolia patient started Prolia in 2012 also received one infusion of Reclast 2011

## 2016-01-04 NOTE — Telephone Encounter (Signed)
Izora Gala please review Prolia for second injection due 06/27/16. Last bone density 2016  Only checking due to this is year 5. Prolia

## 2016-01-04 NOTE — Telephone Encounter (Signed)
message left, 4 full years/8doses of Prolia, 1 year of reclast,

## 2016-01-06 NOTE — Telephone Encounter (Signed)
TC reviewed has completed the 4 years of Prolia one year for Reclast will reevaluate  after repeat DEXA 03/2017. Last DEXA did show some improvement. Home safety, fall prevention and importance of continuing weightbearing exercise and yoga reviewed. Will take a drug holiday at this time.

## 2016-03-30 ENCOUNTER — Encounter: Payer: Medicare Other | Admitting: Women's Health

## 2016-05-19 ENCOUNTER — Encounter: Payer: Self-pay | Admitting: Women's Health

## 2016-05-19 ENCOUNTER — Ambulatory Visit (INDEPENDENT_AMBULATORY_CARE_PROVIDER_SITE_OTHER): Payer: Medicare Other | Admitting: Women's Health

## 2016-05-19 VITALS — BP 132/76 | Ht 63.0 in | Wt 136.8 lb

## 2016-05-19 DIAGNOSIS — M818 Other osteoporosis without current pathological fracture: Secondary | ICD-10-CM | POA: Diagnosis not present

## 2016-05-19 DIAGNOSIS — N952 Postmenopausal atrophic vaginitis: Secondary | ICD-10-CM | POA: Diagnosis not present

## 2016-05-19 DIAGNOSIS — Z124 Encounter for screening for malignant neoplasm of cervix: Secondary | ICD-10-CM | POA: Diagnosis not present

## 2016-05-19 DIAGNOSIS — F4322 Adjustment disorder with anxiety: Secondary | ICD-10-CM | POA: Diagnosis not present

## 2016-05-19 DIAGNOSIS — Z01419 Encounter for gynecological examination (general) (routine) without abnormal findings: Secondary | ICD-10-CM | POA: Diagnosis not present

## 2016-05-19 DIAGNOSIS — Z87898 Personal history of other specified conditions: Secondary | ICD-10-CM | POA: Diagnosis not present

## 2016-05-19 DIAGNOSIS — Z8742 Personal history of other diseases of the female genital tract: Secondary | ICD-10-CM

## 2016-05-19 MED ORDER — ALPRAZOLAM 0.25 MG PO TABS: 0.2500 mg | ORAL_TABLET | Freq: Three times a day (TID) | ORAL | 0 refills | Status: DC | PRN

## 2016-05-19 NOTE — Addendum Note (Signed)
Addended by: Thurnell Garbe A on: 05/19/2016 05:10 PM   Modules accepted: Orders

## 2016-05-19 NOTE — Progress Notes (Signed)
Emma Lopez 10/18/1945 [id number]    History:    Presents for breast and pelvic exam. Normal mammogram history. 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 History of DES exposure. Negative colonoscopy 11/2015. Current on immunizations. Osteoporosis on Prolia since 03/2011 last dose November 2017. 2016 T score -3.1 left femoral neck stable with slight improvement. 2010 DVT on Coumadin.   Past medical history, past surgical history, family history and social history were all reviewed and documented in the EPIC chart. Has a home in [place]. Sister Dub Mikes struggling with dementia, paranoia currently living at [facility] [place]. Mother, 2 maternal aunts breast cancer BRCA status unknown.  ROS:  A ROS was performed and pertinent positives and negatives are included.  Exam:  Vitals:   05/19/16 1426  BP: 132/76  Weight: 136 lb 12.8 oz (62.1 kg)  Height: 5' 3"  (1.6 m)   Body mass index is 24.23 kg/m.   General appearance:  Normal Thyroid:  Symmetrical, normal in size, without palpable masses or nodularity. Respiratory  Auscultation:  Clear without wheezing or rhonchi Cardiovascular  Auscultation:  Regular rate, without rubs, murmurs or gallops  Edema/varicosities:  Not grossly evident Abdominal  Soft,nontender, without masses, guarding or rebound.  Liver/spleen:  No organomegaly noted  Hernia:  None appreciated  Skin  Inspection:  Grossly normal   Breasts: Examined lying and sitting.     Right: Without masses, retractions, discharge or axillary adenopathy.     Left: Without masses, retractions, discharge or axillary adenopathy. Gentitourinary   Inguinal/mons:  Normal without inguinal adenopathy  External genitalia:  Normal  BUS/Urethra/Skene's glands:  Normal  Vagina:  Atrophic  Cervix:  Absent  Uterus:  Absent  Adnexa/parametria    Rt: Without masses or tenderness.   Lt: Without masses or tenderness.  Anus and perineum: Normal  Digital rectal exam: Normal sphincter tone without palpated masses  or tenderness  Assessment/Plan:  70 y.o. S WF G0 for breast and pelvic exam with no complaints.  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 Reviewed last dose. Calcium 12/26/2015  8.7. Reviewed importance of continued regular weightbearing exercise, home safety, fall prevention reviewed. SBE's, continue annual 3-D screening mammogram. Requests repeat Pap. Xanax .25 when necessary prescription, proper use reviewed, states uses less than one prescription per year. Aware of addictive  properties.    Emma Lopez, Emma Lopez WHNP. 3:15 pm 05/19/2016

## 2016-05-19 NOTE — Patient Instructions (Addendum)
Menopause is a normal process in which your reproductive ability comes to an end. This process happens gradually over a span of months to years, usually between the ages of 48 and 55. Menopause is complete when you have missed 12 consecutive menstrual periods. It is important to talk with your health care provider about some of the most common conditions that affect postmenopausal women, such as heart disease, cancer, and bone loss (osteoporosis). Adopting a healthy lifestyle and getting preventive care can help to promote your health and wellness. Those actions can also lower your chances of developing some of these common conditions. WHAT SHOULD I KNOW ABOUT MENOPAUSE? During menopause, you may experience a number of symptoms, such as:  Moderate-to-severe hot flashes.  Night sweats.  Decrease in sex drive.  Mood swings.  Headaches.  Tiredness.  Irritability.  Memory problems.  Insomnia. Choosing to treat or not to treat menopausal changes is an individual decision that you make with your health care provider. WHAT SHOULD I KNOW ABOUT HORMONE REPLACEMENT THERAPY AND SUPPLEMENTS? Hormone therapy products are effective for treating symptoms that are associated with menopause, such as hot flashes and night sweats. Hormone replacement carries certain risks, especially as you become older. If you are thinking about using estrogen or estrogen with progestin treatments, discuss the benefits and risks with your health care provider. WHAT SHOULD I KNOW ABOUT HEART DISEASE AND STROKE? Heart disease, heart attack, and stroke become more likely as you age. This may be due, in part, to the hormonal changes that your body experiences during menopause. These can affect how your body processes dietary fats, triglycerides, and cholesterol. Heart attack and stroke are both medical emergencies. There are many things that you can do to help prevent heart disease and stroke:  Have your blood pressure  checked at least every 1-2 years. High blood pressure causes heart disease and increases the risk of stroke.  If you are 55-79 years old, ask your health care provider if you should take aspirin to prevent a heart attack or a stroke.  Do not use any tobacco products, including cigarettes, chewing tobacco, or electronic cigarettes. If you need help quitting, ask your health care provider.  It is important to eat a healthy diet and maintain a healthy weight.  Be sure to include plenty of vegetables, fruits, low-fat dairy products, and lean protein.  Avoid eating foods that are high in solid fats, added sugars, or salt (sodium).  Get regular exercise. This is one of the most important things that you can do for your health.  Try to exercise for at least 150 minutes each week. The type of exercise that you do should increase your heart rate and make you sweat. This is known as moderate-intensity exercise.  Try to do strengthening exercises at least twice each week. Do these in addition to the moderate-intensity exercise.  Know your numbers.Ask your health care provider to check your cholesterol and your blood glucose. Continue to have your blood tested as directed by your health care provider. WHAT SHOULD I KNOW ABOUT CANCER SCREENING? There are several types of cancer. Take the following steps to reduce your risk and to catch any cancer development as early as possible. Breast Cancer  Practice breast self-awareness.  This means understanding how your breasts normally appear and feel.  It also means doing regular breast self-exams. Let your health care provider know about any changes, no matter how small.  If you are 40 or older, have a clinician do a   breast exam (clinical breast exam or CBE) every year. Depending on your age, family history, and medical history, it may be recommended that you also have a yearly breast X-ray (mammogram).  If you have a family history of breast cancer,  talk with your health care provider about genetic screening.  If you are at high risk for breast cancer, talk with your health care provider about having an MRI and a mammogram every year.  Breast cancer (BRCA) gene test is recommended for women who have family members with BRCA-related cancers. Results of the assessment will determine the need for genetic counseling and BRCA1 and for BRCA2 testing. BRCA-related cancers include these types:  Breast. This occurs in males or females.  Ovarian.  Tubal. This may also be called fallopian tube cancer.  Cancer of the abdominal or pelvic lining (peritoneal cancer).  Prostate.  Pancreatic. Cervical, Uterine, and Ovarian Cancer Your health care provider may recommend that you be screened regularly for cancer of the pelvic organs. These include your ovaries, uterus, and vagina. This screening involves a pelvic exam, which includes checking for microscopic changes to the surface of your cervix (Pap test).  For women ages 21-65, health care providers may recommend a pelvic exam and a Pap test every three years. For women ages 63-65, they may recommend the Pap test and pelvic exam, combined with testing for human papilloma virus (HPV), every five years. Some types of HPV increase your risk of cervical cancer. Testing for HPV may also be done on women of any age who have unclear Pap test results.  Other health care providers may not recommend any screening for nonpregnant women who are considered low risk for pelvic cancer and have no symptoms. Ask your health care provider if a screening pelvic exam is right for you.  If you have had past treatment for cervical cancer or a condition that could lead to cancer, you need Pap tests and screening for cancer for at least 20 years after your treatment. If Pap tests have been discontinued for you, your risk factors (such as having a new sexual partner) need to be reassessed to determine if you should start having  screenings again. Some women have medical problems that increase the chance of getting cervical cancer. In these cases, your health care provider may recommend that you have screening and Pap tests more often.  If you have a family history of uterine cancer or ovarian cancer, talk with your health care provider about genetic screening.  If you have vaginal bleeding after reaching menopause, tell your health care provider.  There are currently no reliable tests available to screen for ovarian cancer. Lung Cancer Lung cancer screening is recommended for adults 15-75 years old who are at high risk for lung cancer because of a history of smoking. A yearly low-dose CT scan of the lungs is recommended if you:  Currently smoke.  Have a history of at least 30 pack-years of smoking and you currently smoke or have quit within the past 15 years. A pack-year is smoking an average of one pack of cigarettes per day for one year. Yearly screening should:  Continue until it has been 15 years since you quit.  Stop if you develop a health problem that would prevent you from having lung cancer treatment. Colorectal Cancer  This type of cancer can be detected and can often be prevented.  Routine colorectal cancer screening usually begins at age 81 and continues through age 92.  If you have  risk factors for colon cancer, your health care provider may recommend that you be screened at an earlier age.  If you have a family history of colorectal cancer, talk with your health care provider about genetic screening.  Your health care provider may also recommend using home test kits to check for hidden blood in your stool.  A small camera at the end of a tube can be used to examine your colon directly (sigmoidoscopy or colonoscopy). This is done to check for the earliest forms of colorectal cancer.  Direct examination of the colon should be repeated every 5-10 years until age 38. However, if early forms of  precancerous polyps or small growths are found or if you have a family history or genetic risk for colorectal cancer, you may need to be screened more often. Skin Cancer  Check your skin from head to toe regularly.  Monitor any moles. Be sure to tell your health care provider:  About any new moles or changes in moles, especially if there is a change in a mole's shape or color.  If you have a mole that is larger than the size of a pencil eraser.  If any of your family members has a history of skin cancer, especially at a young age, talk with your health care provider about genetic screening.  Always use sunscreen. Apply sunscreen liberally and repeatedly throughout the day.  Whenever you are outside, protect yourself by wearing long sleeves, pants, a wide-brimmed hat, and sunglasses. WHAT SHOULD I KNOW ABOUT OSTEOPOROSIS? Osteoporosis is a condition in which bone destruction happens more quickly than new bone creation. After menopause, you may be at an increased risk for osteoporosis. To help prevent osteoporosis or the bone fractures that can happen because of osteoporosis, the following is recommended:  If you are 20-75 years old, get at least 1,000 mg of calcium and at least 600 mg of vitamin D per day.  If you are older than age 13 but younger than age 61, get at least 1,200 mg of calcium and at least 600 mg of vitamin D per day.  If you are older than age 80, get at least 1,200 mg of calcium and at least 800 mg of vitamin D per day. Smoking and excessive alcohol intake increase the risk of osteoporosis. Eat foods that are rich in calcium and vitamin D, and do weight-bearing exercises several times each week as directed by your health care provider. WHAT SHOULD I KNOW ABOUT HOW MENOPAUSE AFFECTS [place]? Depression may occur at any age, but it is more common as you become older. Common symptoms of depression include:  Low or sad mood.  Changes in sleep patterns.  Changes  in appetite or eating patterns.  Feeling an overall lack of motivation or enjoyment of activities that you previously enjoyed.  Frequent crying spells. Talk with your health care provider if you think that you are experiencing depression. WHAT SHOULD I KNOW ABOUT IMMUNIZATIONS? It is important that you get and maintain your immunizations. These include:  Tetanus, diphtheria, and pertussis (Tdap) booster vaccine.  Influenza every year before the flu season begins.  Pneumonia vaccine.  Shingles vaccine. Your health care provider may also recommend other immunizations.   This information is not intended to replace advice given to you by your health care provider. Make sure you discuss any questions you have with your health care provider.   Document Released: 09/09/2005 Document Revised: 08/08/2014 Document Reviewed: 03/20/2014 Elsevier Interactive Patient Education 2016 Elsevier  Inc.  Osteoporosis Osteoporosis is the thinning and loss of density in the bones. Osteoporosis makes the bones more brittle, fragile, and likely to break (fracture). Over time, osteoporosis can cause the bones to become so weak that they fracture after a simple fall. The bones most likely to fracture are the bones in the hip, wrist, and spine. CAUSES  The exact cause is not known. RISK FACTORS Anyone can develop osteoporosis. You may be at greater risk if you have a family history of the condition or have poor nutrition. You may also have a higher risk if you are:   Female.   52 years old or older.  A smoker.  Not physically active.   White or Asian.  Slender. SIGNS AND SYMPTOMS  A fracture might be the first sign of the disease, especially if it results from a fall or injury that would not usually cause a bone to break. Other signs and symptoms include:   Low back and neck pain.  Stooped posture.  Height loss. DIAGNOSIS  To make a diagnosis, your health care provider may:  Take a medical  history.  Perform a physical exam.  Order tests, such as:  A bone mineral density test.  A dual-energy X-ray absorptiometry test. TREATMENT  The goal of osteoporosis treatment is to strengthen your bones to reduce your risk of a fracture. Treatment may involve:  Making lifestyle changes, such as:  Eating a diet rich in calcium.  Doing weight-bearing and muscle-strengthening exercises.  Stopping tobacco use.  Limiting alcohol intake.  Taking medicine to slow the process of bone loss or to increase bone density.  Monitoring your levels of calcium and vitamin D. HOME CARE INSTRUCTIONS  Include calcium and vitamin D in your diet. Calcium is important for bone health, and vitamin D helps the body absorb calcium.  Perform weight-bearing and muscle-strengthening exercises as directed by your health care provider.  Do not use any tobacco products, including cigarettes, chewing tobacco, and electronic cigarettes. If you need help quitting, ask your health care provider.  Limit your alcohol intake.  Take medicines only as directed by your health care provider.  Keep all follow-up visits as directed by your health care provider. This is important.  Take precautions at home to lower your risk of falling, such as:  Keeping rooms well lit and clutter free.  Installing safety rails on stairs.  Using rubber mats in the bathroom and other areas that are often wet or slippery. SEEK IMMEDIATE MEDICAL CARE IF:  You fall or injure yourself.    This information is not intended to replace advice given to you by your health care provider. Make sure you discuss any questions you have with your health care provider.   Document Released: 04/27/2005 Document Revised: 08/08/2014 Document Reviewed: 12/26/2013 Elsevier Interactive Patient Education [employer].

## 2016-05-23 ENCOUNTER — Telehealth: Payer: Self-pay | Admitting: Gynecology

## 2016-05-23 LAB — PAP IG W/ RFLX HPV ASCU

## 2016-05-23 NOTE — Telephone Encounter (Signed)
Emma Lopez's last Prolia dose is due after November 26, calcium 8.7  11/2015. This should be her last dose of Prolia, will complete a 5 year period. thanks   Note from Scherrie Gerlach NP

## 2016-06-22 NOTE — Telephone Encounter (Signed)
PC to pt Prolia due after 06/27/16  Calcium level 8.7  10/12/15 , Complete exam with [facility] 05/19/16.  Insurance  No deductible, co insurance 20%  $420 OOPM $5500 [phone] met). Appointment with OV additional $7.00. PA needed. No referral needed.

## 2016-06-28 ENCOUNTER — Encounter: Payer: Self-pay | Admitting: Gynecology

## 2016-06-28 NOTE — Telephone Encounter (Signed)
PA received from insurance company , Auth# [id number]  From 06/25/16 to 07/31/16. Nurse only appointment, pt will call back to schedule this appointment.

## 2016-07-01 ENCOUNTER — Ambulatory Visit (INDEPENDENT_AMBULATORY_CARE_PROVIDER_SITE_OTHER): Payer: Medicare Other | Admitting: *Deleted

## 2016-07-01 DIAGNOSIS — M810 Age-related osteoporosis without current pathological fracture: Secondary | ICD-10-CM

## 2016-07-01 MED ORDER — DENOSUMAB 60 MG/ML ~~LOC~~ SOLN
60.0000 mg | Freq: Once | SUBCUTANEOUS | Status: AC
  Administered 2016-07-01: 60 mg via SUBCUTANEOUS

## 2016-07-05 NOTE — Telephone Encounter (Signed)
Prolia injection given 07/01/16, per Emma Lopez note last injection has completed 5 years.

## 2016-07-19 ENCOUNTER — Encounter: Payer: Self-pay | Admitting: Gynecology

## 2016-08-11 DIAGNOSIS — Z7901 Long term (current) use of anticoagulants: Secondary | ICD-10-CM | POA: Diagnosis not present

## 2016-09-06 DIAGNOSIS — Z7901 Long term (current) use of anticoagulants: Secondary | ICD-10-CM | POA: Diagnosis not present

## 2016-09-15 DIAGNOSIS — H2513 Age-related nuclear cataract, bilateral: Secondary | ICD-10-CM | POA: Diagnosis not present

## 2016-09-15 DIAGNOSIS — H40013 Open angle with borderline findings, low risk, bilateral: Secondary | ICD-10-CM | POA: Diagnosis not present

## 2016-09-30 DIAGNOSIS — Z7901 Long term (current) use of anticoagulants: Secondary | ICD-10-CM | POA: Diagnosis not present

## 2016-11-08 DIAGNOSIS — Z7901 Long term (current) use of anticoagulants: Secondary | ICD-10-CM | POA: Diagnosis not present

## 2016-11-14 DIAGNOSIS — Z7901 Long term (current) use of anticoagulants: Secondary | ICD-10-CM | POA: Diagnosis not present

## 2016-12-06 DIAGNOSIS — Z7901 Long term (current) use of anticoagulants: Secondary | ICD-10-CM | POA: Diagnosis not present

## 2016-12-15 DIAGNOSIS — M62830 Muscle spasm of back: Secondary | ICD-10-CM | POA: Diagnosis not present

## 2016-12-19 DIAGNOSIS — M25532 Pain in left wrist: Secondary | ICD-10-CM | POA: Diagnosis not present

## 2016-12-19 DIAGNOSIS — M1812 Unilateral primary osteoarthritis of first carpometacarpal joint, left hand: Secondary | ICD-10-CM | POA: Diagnosis not present

## 2016-12-27 ENCOUNTER — Telehealth: Payer: Self-pay | Admitting: Gynecology

## 2016-12-27 NOTE — Telephone Encounter (Signed)
phoned Emma Lopez she is due for a Prolia in June. she did complete the 5 years her last injection was in December. She states she does have new insurance and will be agreeable to take if her insurance does cover it message to [place] from Emma Lopez.

## 2017-01-02 ENCOUNTER — Encounter: Payer: Self-pay | Admitting: Gynecology

## 2017-01-02 DIAGNOSIS — Z1231 Encounter for screening mammogram for malignant neoplasm of breast: Secondary | ICD-10-CM | POA: Diagnosis not present

## 2017-01-02 DIAGNOSIS — Z803 Family history of malignant neoplasm of breast: Secondary | ICD-10-CM | POA: Diagnosis not present

## 2017-01-05 DIAGNOSIS — Z7901 Long term (current) use of anticoagulants: Secondary | ICD-10-CM | POA: Diagnosis not present

## 2017-01-24 NOTE — Telephone Encounter (Signed)
Calcium 8.7 12/21/15. No deductible, With OV $20 copay. Co insurance 20%  approx 216.92 based on Medicare allowable. OOPM $3400 ($48 met). Pt will call back to schedule after she has visit with PCP. She will have Calcium level at PCP.

## 2017-02-06 DIAGNOSIS — Z7901 Long term (current) use of anticoagulants: Secondary | ICD-10-CM | POA: Diagnosis not present

## 2017-02-22 ENCOUNTER — Telehealth: Payer: Self-pay | Admitting: Women's Health

## 2017-02-22 NOTE — Telephone Encounter (Signed)
Telephone call to review Prolia. States will have to postpone due to sister with abdominal metastatic cancer. Reviewed possible Fosamax states unable to swallow pills. Encouraged to increase weightbearing exercise, home safety and fall prevention reviewed. Condolences given regarding  for her sister.

## 2017-02-23 DIAGNOSIS — E559 Vitamin D deficiency, unspecified: Secondary | ICD-10-CM | POA: Diagnosis not present

## 2017-02-23 DIAGNOSIS — E042 Nontoxic multinodular goiter: Secondary | ICD-10-CM | POA: Diagnosis not present

## 2017-02-28 NOTE — Telephone Encounter (Signed)
Pt called and she is unable to begin Prolia due to health of sister. She will call back at a later date. I have informed Elon Alas.

## 2017-03-02 DIAGNOSIS — Z7901 Long term (current) use of anticoagulants: Secondary | ICD-10-CM | POA: Diagnosis not present

## 2017-03-10 ENCOUNTER — Telehealth: Payer: Self-pay | Admitting: *Deleted

## 2017-03-10 NOTE — Telephone Encounter (Signed)
(  pt aware you are out of the office) Pt received letter from [facility] to schedule bone density, pt has not had her 2 dose of Prolia yet due to ill sister.  dexa due this month, pt asked should she schedule dexa? Or have prolia shot first? Please advise

## 2017-03-12 NOTE — Telephone Encounter (Signed)
Have dexa when convenient, okay to have prior to prolia, tell her hope sister is comfortable.

## 2017-03-13 DIAGNOSIS — E559 Vitamin D deficiency, unspecified: Secondary | ICD-10-CM | POA: Diagnosis not present

## 2017-03-13 DIAGNOSIS — M810 Age-related osteoporosis without current pathological fracture: Secondary | ICD-10-CM | POA: Diagnosis not present

## 2017-03-13 DIAGNOSIS — E042 Nontoxic multinodular goiter: Secondary | ICD-10-CM | POA: Diagnosis not present

## 2017-03-13 NOTE — Telephone Encounter (Signed)
Left detailed message on home voicemail. 

## 2017-03-16 ENCOUNTER — Telehealth: Payer: Self-pay | Admitting: Gynecology

## 2017-03-16 NOTE — Telephone Encounter (Signed)
Calcium 02/23/17  9.0 PCP

## 2017-03-23 NOTE — Telephone Encounter (Signed)
prolia benefits  NO PA per Levonne Spiller at [facility] if injection is given at provider office.  No dedubcitlbe. 20% co insurance $216.92  Also co pay $20 with OV.  OOPM $3400 ( $48 met).  Follows Medicare guidelines  M81.0

## 2017-03-27 ENCOUNTER — Ambulatory Visit: Payer: Medicare Other | Admitting: Women's Health

## 2017-03-27 NOTE — Telephone Encounter (Signed)
Confirmed no PA per [facility] if given as out pt. [employer].PC to pt VM that no PA needed and Prolia injection can be scheduled she can call back to schedule Nurse only visit.

## 2017-03-28 NOTE — Telephone Encounter (Signed)
Prolia appointment and N Young 04/05/17

## 2017-03-29 ENCOUNTER — Telehealth: Payer: Self-pay | Admitting: Genetics

## 2017-03-29 ENCOUNTER — Encounter: Payer: Self-pay | Admitting: Gynecology

## 2017-03-29 NOTE — Telephone Encounter (Signed)
Called Emma Lopez to inform her of her sister, Emma Lai', genetic testing results. Emma Lopez attended her sister's initial genetic counseling appointment on 03/14/2017. At that time, Emma Lai gave consent for genetic testing results to be disclosed to Emma Lopez. During results disclosure, I reminded Emma Lopez that, despite Emma Lai' negative results, she is a candidate for genetic testing due to additional family history. Emma Lopez elected to pursue genetic testing through [facility] Common Hereditary Cancers Panel. [facility] Common Hereditary Cancers Panel includes analysis of the following 46 genes: APC, ATM, AXIN2, BARD1, BMPR1A, BRCA1, BRCA2, BRIP1, CDH1, CDKN2A, CHEK2, CTNNA1, DICER1, EPCAM, GREM1, HOXB13, KIT, MEN1, MLH1, MSH2, MSH3, MSH6, MUTYH, NBN, NF1, NTHL1, PALB2, PDGFRA, PMS2, POLD1, POLE, PTEN, RAD50, RAD51C, RAD51D, SDHA, SDHB, SDHC, SDHD, SMAD4, SMARCA4, STK11, TP53, TSC1, TSC2, and VHL.  Based on Emma Lopez's family history of cancer, she meets medical criteria for genetic testing. Despite that she meets criteria, she may still have an out of pocket cost. We discussed that if her out of pocket cost for testing is over $100, the laboratory will call and confirm whether she wants to proceed with testing.  If the out of pocket cost of testing is less than $100 she will be billed by the genetic testing laboratory.   Emma Lopez is scheduled to have her blood drawn for genetic testing on 03/31/2017 at 1PM. Results should be available within approximately 3 weeks' time from her blood draw date. Once results are available, they will be disclosed by telephone to Emma Lopez, as will any additional recommendations warranted by these results. This information will also be available in Epic. Our contact information was provided should additional questions or concerns arise.  History: Emma Lopez has no children. She has one sister, Emma Lai, who is 69 with ovarian cancer and has declined treatment. Emma Lai' genetic testing was negative for  mutations in 46 genes analyzed by [facility] Common Hereditary Cancers Panel. [facility] Common Hereditary Cancers Panel includes analysis of the following 46 genes: APC, ATM, AXIN2, BARD1, BMPR1A, BRCA1, BRCA2, BRIP1, CDH1, CDKN2A, CHEK2, CTNNA1, DICER1, EPCAM, GREM1, HOXB13, KIT, MEN1, MLH1, MSH2, MSH3, MSH6, MUTYH, NBN, NF1, NTHL1, PALB2, PDGFRA, PMS2, POLD1, POLE, PTEN, RAD50, RAD51C, RAD51D, SDHA, SDHB, SDHC, SDHD, SMAD4, SMARCA4, STK11, TP53, TSC1, TSC2, and VHL.   Ms. Dieu mother was diagnosed with breast cancer at age 39 and died from metastatic disease (mets to brain) at age 34. Ms. Tolen reports that her mother also had a precancerous colon tumor. Ms. Bratz had three maternal uncles and two maternal aunts. One maternal uncle died from colon cancer in his late-70s. The other two uncles died in their 66s without cancers. Both of Ms. Corbo's aunts had breast cancer. Her aunt Emma Lai was diagnosed with breast cancer in her late-60s and melanoma of her leg in her 49s. Her aunt Luane School was diagnosed with breast and colon cancer around the same time and died in her 25s. Ms. Quigley maternal grandparents died in their 15s and 61s without cancers.  Ms. Varble father was diagnosed with colon cancer in his late-80s and was suspected to also have prostate cancer. He died at 32. Ms. Rufener had three paternal uncles and five paternal aunts. An uncle died from pancreatic cancer in his 61s. An aunt was diagnosed with ovarian cancer in her mid-69s and later diagnosed with colon cancer. She died at age 38. A paternal first-cousin (daughter of unaffected paternal aunt) died from metastatic breast cancer at 21. Another paternal first-cousin (son of unaffected paternal uncle) had prostate cancer. Ms. Bhandari paternal grandfather  died from a stroke in his late-50s. Her paternal grandmother died in her 73s without cancer.  Patient's maternal ancestors are of [place] descent, and paternal ancestors are of [place]  descent. There is no reported Ashkenazi Jewish ancestry. There is no known consanguinity.

## 2017-03-30 DIAGNOSIS — Z7901 Long term (current) use of anticoagulants: Secondary | ICD-10-CM | POA: Diagnosis not present

## 2017-03-31 ENCOUNTER — Other Ambulatory Visit: Payer: Medicare Other

## 2017-04-05 ENCOUNTER — Encounter: Payer: Self-pay | Admitting: Women's Health

## 2017-04-05 ENCOUNTER — Ambulatory Visit (INDEPENDENT_AMBULATORY_CARE_PROVIDER_SITE_OTHER): Payer: PPO | Admitting: Women's Health

## 2017-04-05 ENCOUNTER — Other Ambulatory Visit: Payer: PPO

## 2017-04-05 VITALS — BP 122/80

## 2017-04-05 DIAGNOSIS — M810 Age-related osteoporosis without current pathological fracture: Secondary | ICD-10-CM

## 2017-04-05 DIAGNOSIS — F4322 Adjustment disorder with anxiety: Secondary | ICD-10-CM | POA: Diagnosis not present

## 2017-04-05 DIAGNOSIS — R102 Pelvic and perineal pain: Secondary | ICD-10-CM | POA: Diagnosis not present

## 2017-04-05 DIAGNOSIS — N898 Other specified noninflammatory disorders of vagina: Secondary | ICD-10-CM

## 2017-04-05 DIAGNOSIS — Z8042 Family history of malignant neoplasm of prostate: Secondary | ICD-10-CM | POA: Diagnosis not present

## 2017-04-05 DIAGNOSIS — Z803 Family history of malignant neoplasm of breast: Secondary | ICD-10-CM | POA: Diagnosis not present

## 2017-04-05 DIAGNOSIS — Z8041 Family history of malignant neoplasm of ovary: Secondary | ICD-10-CM | POA: Diagnosis not present

## 2017-04-05 DIAGNOSIS — R140 Abdominal distension (gaseous): Secondary | ICD-10-CM | POA: Diagnosis not present

## 2017-04-05 DIAGNOSIS — Z800 Family history of malignant neoplasm of digestive organs: Secondary | ICD-10-CM | POA: Diagnosis not present

## 2017-04-05 LAB — WET PREP FOR TRICH, YEAST, CLUE

## 2017-04-05 MED ORDER — ALPRAZOLAM 0.25 MG PO TABS: 0.2500 mg | ORAL_TABLET | Freq: Three times a day (TID) | ORAL | 0 refills | Status: DC | PRN

## 2017-04-05 NOTE — Progress Notes (Signed)
Presents with complaint of questionable vaginal blood noted.on toilet tissue. TVH for cervical dysplasia from DES, no HRT. Has also noted a scant amount of white discharge, vaginal irritation. Also having  low abdominal pressure, bloating.  Not sexually active. Under increased stress, sister Joaquim Lai under hospice care for ovarian cancer with metastasis. Joaquim Lai is BRCA negative, she also plans to have BRCA testing done. Osteoporosis on Prolia dose received today.  Exam: Appears well. External genitalia mild erythema at introitus, speculum exam atrophic, no visible discharge minimal erythema, wet prep negative. Bimanual minimal tenderness with exam, rectal exam negative. UA: Trace leukocytes, 6-10 WBCs, 0-5 squamous up these, few bacteria no blood or RBCs  Mild vaginal atrophy Abdominal pressure/bloating postmenopausal on no HRT  Plan: Schedule ultrasound to rule out ovarian, condolences given regarding sisters for health. Urine culture pending. Refill of Xanax 0.25 per request due to situational stress of sisters illness, aware to use sparingly, addictive properties reviewed.

## 2017-04-06 NOTE — Telephone Encounter (Signed)
Emma Lopez rescheduled her blood draw for genetic testing for 04/05/2017.

## 2017-04-07 DIAGNOSIS — M810 Age-related osteoporosis without current pathological fracture: Secondary | ICD-10-CM

## 2017-04-07 MED ORDER — DENOSUMAB 60 MG/ML ~~LOC~~ SOLN
60.0000 mg | Freq: Once | SUBCUTANEOUS | Status: AC
  Administered 2017-04-07: 60 mg via SUBCUTANEOUS

## 2017-04-07 NOTE — Addendum Note (Signed)
Addended by: Burnett Kanaris on: 04/07/2017 08:19 AM   Modules accepted: Orders

## 2017-04-13 NOTE — Telephone Encounter (Signed)
Prolia given 04/05/17  Next injection TBS 10/04/17

## 2017-04-17 ENCOUNTER — Ambulatory Visit: Payer: Self-pay | Admitting: Genetics

## 2017-04-17 ENCOUNTER — Telehealth: Payer: Self-pay | Admitting: Genetics

## 2017-04-17 ENCOUNTER — Encounter: Payer: Self-pay | Admitting: Genetics

## 2017-04-17 DIAGNOSIS — Z800 Family history of malignant neoplasm of digestive organs: Secondary | ICD-10-CM

## 2017-04-17 DIAGNOSIS — Z8041 Family history of malignant neoplasm of ovary: Secondary | ICD-10-CM

## 2017-04-17 DIAGNOSIS — Z808 Family history of malignant neoplasm of other organs or systems: Secondary | ICD-10-CM

## 2017-04-17 DIAGNOSIS — Z1379 Encounter for other screening for genetic and chromosomal anomalies: Secondary | ICD-10-CM

## 2017-04-17 DIAGNOSIS — Z803 Family history of malignant neoplasm of breast: Secondary | ICD-10-CM

## 2017-04-17 NOTE — Progress Notes (Signed)
HPI: Ms. Brabant was previously seen in the [facility] with her sister on 04/24/2017 due to a family history of ovarian and other types of cancer and concerns regarding a hereditary predisposition to cancer. Please refer to our prior cancer genetics clinic note for more information regarding Ms. Henigan's medical, social and family histories, and our assessment and recommendations, at the time. Ms. Esteban recent genetic test results were disclosed to her, as well as recommendations warranted by these results. These results and recommendations are discussed in more detail below.  FAMILY HISTORY:  We obtained a detailed, 4-generation family history.  Significant diagnoses are listed below: Family History  Problem Relation Age of Onset  . Hypertension Mother   . Breast cancer Mother        85's  . Cancer Father        COLON  . Hypertension Sister   . Stroke Sister   . Breast cancer Maternal Aunt        Age 61's  . Cancer Maternal Aunt        Melanoma  . Alzheimer's disease Maternal Aunt   . Cancer Paternal Aunt        OVARIAN and COLON  . Breast cancer Maternal Aunt        70's  . Cancer Maternal Aunt        Colon CA  . Alzheimer's disease Maternal Aunt    Ms. Chason has no children. She has one sister, Joaquim Lai, who is 33 with ovarian cancer and has declined treatment. Joaquim Lai' genetic testing was negative for mutations in 46 genes analyzed by [facility]'s Common Hereditary Cancers Panel. [facility] Common Hereditary Cancers Panel includes analysis of the following 46 genes: APC, ATM, AXIN2, BARD1, BMPR1A, BRCA1, BRCA2, BRIP1, CDH1, CDKN2A, CHEK2, CTNNA1, DICER1, EPCAM, GREM1, HOXB13, KIT, MEN1, MLH1, MSH2, MSH3, MSH6, MUTYH, NBN, NF1, NTHL1, PALB2, PDGFRA, PMS2, POLD1, POLE, PTEN, RAD50, RAD51C, RAD51D, SDHA, SDHB, SDHC, SDHD, SMAD4, SMARCA4, STK11, TP53, TSC1, TSC2, and VHL.   Ms. Braniff mother was diagnosed with breast cancer at age 48 and died from metastatic disease  (mets to brain) at age 75. Ms. Hobday reports that her mother also had a precancerous colon tumor. Ms. Funderburke had three maternal uncles and two maternal aunts. One maternal uncle died from colon cancer in his late-70s. The other two uncles died in their 33s without cancers. Both of Ms. Rasmusson's aunts had breast cancer. Her aunt Joaquim Lai was diagnosed with breast cancer in her late-60s and melanoma of her leg in her 77s. Her aunt Luane School was diagnosed with breast and colon cancer around the same time and died in her 62s. Ms. Hogston maternal grandparents died in their 14s and 53s without cancers.  Ms. Degner father was diagnosed with colon cancer in his late-80s and was suspected to also have prostate cancer. He died at 27. Ms. Colborn had three paternal uncles and five paternal aunts. An uncle died from pancreatic cancer in his 58s. An aunt was diagnosed with ovarian cancer in her mid-11s and later diagnosed with colon cancer. She died at age 13. A paternal first-cousin (daughter of unaffected paternal aunt) died from metastatic breast cancer at 85. Another paternal first-cousin (son of unaffected paternal uncle) had prostate cancer. Ms. Umali paternal grandfather died from a stroke in his late-50s. Her paternal grandmother died in her 99s without cancer.  Patient's maternal ancestors are of Irishdescent, and paternal ancestors are of Interior and spatial designer. There is noreported Ashkenazi Jewish ancestry. There is noknown  consanguinity.  GENETIC TEST RESULTS: Genetic testing performed through [facility] Common Hereditary Cancer Panel reported out on 04/16/2017 showed no pathogenic mutations. The Hereditary Gene Panel offered by [facility] includes sequencing and/or deletion duplication testing of the following 46 genes: APC, ATM, AXIN2, BARD1, BMPR1A, BRCA1, BRCA2, BRIP1, CDH1, CDKN2A (p14ARF), CDKN2A (p16INK4a), CHEK2, CTNNA1, DICER1, EPCAM (Deletion/duplication testing only), GREM1 (promoter region  deletion/duplication testing only), KIT, MEN1, MLH1, MSH2, MSH3, MSH6, MUTYH, NBN, NF1, NHTL1, PALB2, PDGFRA, PMS2, POLD1, POLE, PTEN, RAD50, RAD51C, RAD51D, SDHB, SDHC, SDHD, SMAD4, SMARCA4. STK11, TP53, TSC1, TSC2, and VHL.  The following genes were evaluated for sequence changes only: SDHA and HOXB13 c.251G>A variant only..  The test report will be scanned into EPIC and will be located under the Molecular Pathology section of the Results Review tab.A portion of the result report is included below for reference.      We discussed with Ms. Haldeman that because current genetic testing is not perfect, it is possible there may be a gene mutation in one of these genes that current testing cannot detect, but that chance is small. We also discussed, that there could be another gene that has not yet been discovered, or that we have not yet tested, that is responsible for the cancer diagnoses in the family. There could also be a hereditary cause for her family's cancer that she did not inherit and therefore was not detected in her.   Therefore, it is important to remain in touch with cancer genetics in the future so that we can continue to offer Ms. Parran the most up to date genetic testing.   ADDITIONAL GENETIC TESTING: We discussed with Ms. Modisette that there are other genes that are associated with increased cancer risk that can be analyzed. The laboratories that offer this testing look at these additional genes via a hereditary cancer gene panel. Should Ms. Bostwick wish to pursue additional genetic testing, we are happy to discuss and coordinate this testing, at any time.    CANCER SCREENING RECOMMENDATIONS: This normal result  indicates that it is unlikely Ms. Suchecki has an increased risk of cancer due to a mutation in one of these genes.  However, genetic testing is not perfect and cannot rule out the possibility of a hereditary cause for the cancer in her family.  Other factors such as her personal and family  history may still affect her cancer risk.  Therefore, Ms. Pociask was advised to continue following the cancer screening guidelines provided by her primary healthcare providers.  Based on the Ms. Emmer's  family history of cancer, as well as her genetic test results, the statistical model Tobey Bride Cusik was used to estimate her risk of developing breast cancer. This estimates her lifetime risk of developing 8. to be approximately 8.2%.  The patient's lifetime breast cancer risk is a preliminary estimate based on available information using one of several models endorsed by the [place] (ACS). The ACS recommends consideration of breast MRI screening as an adjunct to mammography for patients at high risk (defined as 20% or greater lifetime risk). A more detailed breast cancer risk assessment can be considered, if clinically indicated.         Based on the family history of colon cancer (in a 1st degree relative) we recommend that Ms. Guerin have a colonoscopy ever 5 years or as directed by her gastroenterologist. We recommend the same screening to her sister who also is related to her father with colon cancer.   Ms. Behan  may wish to discuss ovarian cancer screening and prevention options with her [facility].  There was no genetic mutation identified that increases ovarian cancer risk, but her family history of ovarian cancer does increase her risk to develop this cancer somewhat.  If she is interested she could discuss these options/risks/benefits, etc. with her physicians.    RECOMMENDATIONS FOR FAMILY MEMBERS: Women in this family might be at some increased risk of developing cancer, over the general population risk, simply due to the family history of cancer. We recommended women in this family have a yearly mammogram beginning at age 63, or 54 years younger than the earliest onset of cancer, an annual clinical breast exam, and perform monthly breast self-exams. Women in this family  should also have a gynecological exam as recommended by their primary provider. All family members should have a colonoscopy by age 75 (or as directed by their doctors based on family history of colon cancer).  Based on Ms. Marasigan's family history, we recommended her cousins and other relatives (aunts/uncles/couins/ etc) have genetic counseling and testing.  There is enough family history of cancer on both sides that these relatives will likely meet medical criteria for genetic testing.  Ms. Tweedy will let us know if we can be of any assistance in coordinating genetic counseling and/or testing for this family member.   FOLLOW-UP: Lastly, we discussed with Ms. Haubner that cancer genetics is a rapidly advancing field and it is possible that new genetic tests will be appropriate for her and/or her family members in the future. We encouraged her to remain in contact with cancer genetics on an annual basis so we can update her personal and family histories and let her know of advances in cancer genetics that may benefit this family.   Our contact number was provided. Ms. Duff questions were answered to her satisfaction, and she knows she is welcome to call us at anytime with additional questions or concerns.   Ferol Luz, MS Genetic Counselor [email or web address]

## 2017-04-17 NOTE — Telephone Encounter (Signed)
Revealed negative genetic testing.  Discussed that we do not know why there is cancer in the family. It could be due to a different gene that we are not testing, or maybe our current technology may not be able to pick something up.  There could also be a hereditary cause for the cancer in her family that she an her sister did not inherit and therefore was not detected in her.  It will be important for her to keep in contact with genetics to keep up with whether additional testing may be needed.  Recommended other relatives on both sides of the family consider genetic testing (cousins, aunts/ uncles, etc).

## 2017-04-19 ENCOUNTER — Ambulatory Visit (INDEPENDENT_AMBULATORY_CARE_PROVIDER_SITE_OTHER): Payer: PPO

## 2017-04-19 ENCOUNTER — Encounter: Payer: Self-pay | Admitting: Women's Health

## 2017-04-19 ENCOUNTER — Other Ambulatory Visit: Payer: Self-pay | Admitting: Women's Health

## 2017-04-19 ENCOUNTER — Ambulatory Visit (INDEPENDENT_AMBULATORY_CARE_PROVIDER_SITE_OTHER): Payer: PPO | Admitting: Women's Health

## 2017-04-19 VITALS — BP 120/82

## 2017-04-19 DIAGNOSIS — R102 Pelvic and perineal pain: Secondary | ICD-10-CM | POA: Diagnosis not present

## 2017-04-19 DIAGNOSIS — R140 Abdominal distension (gaseous): Secondary | ICD-10-CM

## 2017-04-19 DIAGNOSIS — N939 Abnormal uterine and vaginal bleeding, unspecified: Secondary | ICD-10-CM | POA: Diagnosis not present

## 2017-04-19 NOTE — Progress Notes (Signed)
Presents for [place] r/t recent episode of vaginal discharge with scant blood and lower abdominal pressure with bloating at OV  09/05. TVH for cervical dysplasia, DES exposure,  no HRT. Helping to care for sister  under hospice care for ovarian cancer with metastasis. Family hx significant for ovarian, breast, melanoma and colon cancer, heart disease and Alzheimer's. Recent BRCA testing negative; sister BRCA negative. Osteoporosis with no fractures on Prolia.  Discussed [place]: Uterus absent, right and left ovary normal Echo atrophic. Positive arterial blood flow to both ovaries. No apparent mass in the right or left adnexal..  Normal GYN ultrasound  Plan: Ultrasound reviewed, reassurance given regarding normality of ultrasound findings/ovaries. Keep scheduled annual exam next month. Discussed current stressors re: sister's health and possibly taking a day trip to [place] for leisure.

## 2017-04-25 DIAGNOSIS — Z7901 Long term (current) use of anticoagulants: Secondary | ICD-10-CM | POA: Diagnosis not present

## 2017-04-25 DIAGNOSIS — Z23 Encounter for immunization: Secondary | ICD-10-CM | POA: Diagnosis not present

## 2017-05-04 DIAGNOSIS — H40013 Open angle with borderline findings, low risk, bilateral: Secondary | ICD-10-CM | POA: Diagnosis not present

## 2017-05-04 DIAGNOSIS — H2513 Age-related nuclear cataract, bilateral: Secondary | ICD-10-CM | POA: Diagnosis not present

## 2017-05-16 DIAGNOSIS — Z7901 Long term (current) use of anticoagulants: Secondary | ICD-10-CM | POA: Diagnosis not present

## 2017-05-23 ENCOUNTER — Encounter: Payer: PPO | Admitting: Women's Health

## 2017-05-29 ENCOUNTER — Encounter: Payer: Self-pay | Admitting: Women's Health

## 2017-05-29 ENCOUNTER — Ambulatory Visit (INDEPENDENT_AMBULATORY_CARE_PROVIDER_SITE_OTHER): Payer: PPO | Admitting: Women's Health

## 2017-05-29 VITALS — BP 124/80 | Ht 63.0 in | Wt 140.0 lb

## 2017-05-29 DIAGNOSIS — M810 Age-related osteoporosis without current pathological fracture: Secondary | ICD-10-CM

## 2017-05-29 DIAGNOSIS — E2839 Other primary ovarian failure: Secondary | ICD-10-CM

## 2017-05-29 DIAGNOSIS — Z01419 Encounter for gynecological examination (general) (routine) without abnormal findings: Secondary | ICD-10-CM | POA: Diagnosis not present

## 2017-05-29 DIAGNOSIS — Z1272 Encounter for screening for malignant neoplasm of vagina: Secondary | ICD-10-CM | POA: Diagnosis not present

## 2017-05-29 NOTE — Progress Notes (Addendum)
YANIA BOGIE 1945/11/16 [id number]    History:    Presents for breast and pelvic exam. 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 Sister ovarian cancer, both she and sister are BRCA negative. Normal mammogram history. 2010 DVT, history of a PE on Coumadin. Osteoporosis on Prolia past 6 years, DEXA 03/2015 -3.1 femoral neck. 2017 negative colonoscopy. Current on vaccines. Primary care manages anxiety and restless legs syndrome.   Past medical history, past surgical history, family history and social history were all reviewed and documented in the EPIC chart.  Sister Dub Mikes with ovarian cancer in long-term care with hospice also has dementia . Has a home in [place].  ROS:  A ROS was performed and pertinent positives and negatives are included.  Exam:  Vitals:   05/29/17 1126  BP: 124/80  Weight: 140 lb (63.5 kg)  Height: 5' 3"  (1.6 m)   Body mass index is 24.8 kg/m.   General appearance:  Normal Thyroid:  Symmetrical, normal in size, without palpable masses or nodularity. Respiratory  Auscultation:  Clear without wheezing or rhonchi Cardiovascular  Auscultation:  Regular rate, without rubs, murmurs or gallops  Edema/varicosities:  Not grossly evident Abdominal  Soft,nontender, without masses, guarding or rebound.  Liver/spleen:  No organomegaly noted  Hernia:  None appreciated  Skin  Inspection:  Grossly normal   Breasts: Examined lying and sitting.     Right: Without masses, retractions, discharge or axillary adenopathy.     Left: Without masses, retractions, discharge or axillary adenopathy. Gentitourinary   Inguinal/mons:  Normal without inguinal adenopathy  External genitalia:  Normal  BUS/Urethra/Skene's glands:  Normal  Vagina:  Normal  Cervix:  An uterus absent Pap from sleeve.  Adnexa/parametria:     Rt: Without masses or tenderness.   Lt: Without masses or tenderness.  Anus and perineum: Normal  Digital rectal exam: Normal sphincter tone without palpated masses or  tenderness  Assessment/Plan:  71 y.o. S WF G0 for breast and pelvic exam with no complaints.  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 Home safety, fall prevention and importance of balance type exercises reviewed and encouraged. SBE's, continue annual screening mammogram, calcium rich diet, vitamin D 2000 daily encouraged. Repeat DEXA, will schedule. Pap per request, reviewed could stop screening, declines.      [place], 1:08 PM 05/29/2017

## 2017-05-29 NOTE — Patient Instructions (Signed)
Health Maintenance for Postmenopausal Women Menopause is a normal process in which your reproductive ability comes to an end. This process happens gradually over a span of months to years, usually between the ages of 22 and 9. Menopause is complete when you have missed 12 consecutive menstrual periods. It is important to talk with your health care provider about some of the most common conditions that affect postmenopausal women, such as heart disease, cancer, and bone loss (osteoporosis). Adopting a healthy lifestyle and getting preventive care can help to promote your health and wellness. Those actions can also lower your chances of developing some of these common conditions. What should I know about menopause? During menopause, you may experience a number of symptoms, such as:  Moderate-to-severe hot flashes.  Night sweats.  Decrease in sex drive.  Mood swings.  Headaches.  Tiredness.  Irritability.  Memory problems.  Insomnia.  Choosing to treat or not to treat menopausal changes is an individual decision that you make with your health care provider. What should I know about hormone replacement therapy and supplements? Hormone therapy products are effective for treating symptoms that are associated with menopause, such as hot flashes and night sweats. Hormone replacement carries certain risks, especially as you become older. If you are thinking about using estrogen or estrogen with progestin treatments, discuss the benefits and risks with your health care provider. What should I know about heart disease and stroke? Heart disease, heart attack, and stroke become more likely as you age. This may be due, in part, to the hormonal changes that your body experiences during menopause. These can affect how your body processes dietary fats, triglycerides, and cholesterol. Heart attack and stroke are both medical emergencies. There are many things that you can do to help prevent heart disease  and stroke:  Have your blood pressure checked at least every 1-2 years. High blood pressure causes heart disease and increases the risk of stroke.  If you are 53-22 years old, ask your health care provider if you should take aspirin to prevent a heart attack or a stroke.  Do not use any tobacco products, including cigarettes, chewing tobacco, or electronic cigarettes. If you need help quitting, ask your health care provider.  It is important to eat a healthy diet and maintain a healthy weight. ? Be sure to include plenty of vegetables, fruits, low-fat dairy products, and lean protein. ? Avoid eating foods that are high in solid fats, added sugars, or salt (sodium).  Get regular exercise. This is one of the most important things that you can do for your health. ? Try to exercise for at least 150 minutes each week. The type of exercise that you do should increase your heart rate and make you sweat. This is known as moderate-intensity exercise. ? Try to do strengthening exercises at least twice each week. Do these in addition to the moderate-intensity exercise.  Know your numbers.Ask your health care provider to check your cholesterol and your blood glucose. Continue to have your blood tested as directed by your health care provider.  What should I know about cancer screening? There are several types of cancer. Take the following steps to reduce your risk and to catch any cancer development as early as possible. Breast Cancer  Practice breast self-awareness. ? This means understanding how your breasts normally appear and feel. ? It also means doing regular breast self-exams. Let your health care provider know about any changes, no matter how small.  If you are 40  or older, have a clinician do a breast exam (clinical breast exam or CBE) every year. Depending on your age, family history, and medical history, it may be recommended that you also have a yearly breast X-ray (mammogram).  If you  have a family history of breast cancer, talk with your health care provider about genetic screening.  If you are at high risk for breast cancer, talk with your health care provider about having an MRI and a mammogram every year.  Breast cancer (BRCA) gene test is recommended for women who have family members with BRCA-related cancers. Results of the assessment will determine the need for genetic counseling and BRCA1 and for BRCA2 testing. BRCA-related cancers include these types: ? Breast. This occurs in males or females. ? Ovarian. ? Tubal. This may also be called fallopian tube cancer. ? Cancer of the abdominal or pelvic lining (peritoneal cancer). ? Prostate. ? Pancreatic.  Cervical, Uterine, and Ovarian Cancer Your health care provider may recommend that you be screened regularly for cancer of the pelvic organs. These include your ovaries, uterus, and vagina. This screening involves a pelvic exam, which includes checking for microscopic changes to the surface of your cervix (Pap test).  For women ages 21-65, health care providers may recommend a pelvic exam and a Pap test every three years. For women ages 79-65, they may recommend the Pap test and pelvic exam, combined with testing for human papilloma virus (HPV), every five years. Some types of HPV increase your risk of cervical cancer. Testing for HPV may also be done on women of any age who have unclear Pap test results.  Other health care providers may not recommend any screening for nonpregnant women who are considered low risk for pelvic cancer and have no symptoms. Ask your health care provider if a screening pelvic exam is right for you.  If you have had past treatment for cervical cancer or a condition that could lead to cancer, you need Pap tests and screening for cancer for at least 20 years after your treatment. If Pap tests have been discontinued for you, your risk factors (such as having a new sexual partner) need to be  reassessed to determine if you should start having screenings again. Some women have medical problems that increase the chance of getting cervical cancer. In these cases, your health care provider may recommend that you have screening and Pap tests more often.  If you have a family history of uterine cancer or ovarian cancer, talk with your health care provider about genetic screening.  If you have vaginal bleeding after reaching menopause, tell your health care provider.  There are currently no reliable tests available to screen for ovarian cancer.  Lung Cancer Lung cancer screening is recommended for adults 69-62 years old who are at high risk for lung cancer because of a history of smoking. A yearly low-dose CT scan of the lungs is recommended if you:  Currently smoke.  Have a history of at least 30 pack-years of smoking and you currently smoke or have quit within the past 15 years. A pack-year is smoking an average of one pack of cigarettes per day for one year.  Yearly screening should:  Continue until it has been 15 years since you quit.  Stop if you develop a health problem that would prevent you from having lung cancer treatment.  Colorectal Cancer  This type of cancer can be detected and can often be prevented.  Routine colorectal cancer screening usually begins at  age 42 and continues through age 45.  If you have risk factors for colon cancer, your health care provider may recommend that you be screened at an earlier age.  If you have a family history of colorectal cancer, talk with your health care provider about genetic screening.  Your health care provider may also recommend using home test kits to check for hidden blood in your stool.  A small camera at the end of a tube can be used to examine your colon directly (sigmoidoscopy or colonoscopy). This is done to check for the earliest forms of colorectal cancer.  Direct examination of the colon should be repeated every  5-10 years until age 71. However, if early forms of precancerous polyps or small growths are found or if you have a family history or genetic risk for colorectal cancer, you may need to be screened more often.  Skin Cancer  Check your skin from head to toe regularly.  Monitor any moles. Be sure to tell your health care provider: ? About any new moles or changes in moles, especially if there is a change in a mole's shape or color. ? If you have a mole that is larger than the size of a pencil eraser.  If any of your family members has a history of skin cancer, especially at a young age, talk with your health care provider about genetic screening.  Always use sunscreen. Apply sunscreen liberally and repeatedly throughout the day.  Whenever you are outside, protect yourself by wearing long sleeves, pants, a wide-brimmed hat, and sunglasses.  What should I know about osteoporosis? Osteoporosis is a condition in which bone destruction happens more quickly than new bone creation. After menopause, you may be at an increased risk for osteoporosis. To help prevent osteoporosis or the bone fractures that can happen because of osteoporosis, the following is recommended:  If you are 46-71 years old, get at least 1,000 mg of calcium and at least 600 mg of vitamin D per day.  If you are older than age 55 but younger than age 65, get at least 1,200 mg of calcium and at least 600 mg of vitamin D per day.  If you are older than age 54, get at least 1,200 mg of calcium and at least 800 mg of vitamin D per day.  Smoking and excessive alcohol intake increase the risk of osteoporosis. Eat foods that are rich in calcium and vitamin D, and do weight-bearing exercises several times each week as directed by your health care provider. What should I know about how menopause affects my mental health? Depression may occur at any age, but it is more common as you become older. Common symptoms of depression  include:  Low or sad mood.  Changes in sleep patterns.  Changes in appetite or eating patterns.  Feeling an overall lack of motivation or enjoyment of activities that you previously enjoyed.  Frequent crying spells.  Talk with your health care provider if you think that you are experiencing depression. What should I know about immunizations? It is important that you get and maintain your immunizations. These include:  Tetanus, diphtheria, and pertussis (Tdap) booster vaccine.  Influenza every year before the flu season begins.  Pneumonia vaccine.  Shingles vaccine.  Your health care provider may also recommend other immunizations. This information is not intended to replace advice given to you by your health care provider. Make sure you discuss any questions you have with your health care provider. Document Released: 09/09/2005  Document Revised: 02/05/2016 Document Reviewed: 04/21/2015 Elsevier Interactive Patient Education  2018 [employer]  

## 2017-05-31 LAB — PAP IG (IMAGE GUIDED)

## 2017-06-20 DIAGNOSIS — M797 Fibromyalgia: Secondary | ICD-10-CM | POA: Diagnosis not present

## 2017-06-20 DIAGNOSIS — F339 Major depressive disorder, recurrent, unspecified: Secondary | ICD-10-CM | POA: Diagnosis not present

## 2017-06-20 DIAGNOSIS — M810 Age-related osteoporosis without current pathological fracture: Secondary | ICD-10-CM | POA: Diagnosis not present

## 2017-06-20 DIAGNOSIS — K589 Irritable bowel syndrome without diarrhea: Secondary | ICD-10-CM | POA: Diagnosis not present

## 2017-06-20 DIAGNOSIS — Z0000 Encounter for general adult medical examination without abnormal findings: Secondary | ICD-10-CM | POA: Diagnosis not present

## 2017-06-20 DIAGNOSIS — E782 Mixed hyperlipidemia: Secondary | ICD-10-CM | POA: Diagnosis not present

## 2017-06-20 DIAGNOSIS — E559 Vitamin D deficiency, unspecified: Secondary | ICD-10-CM | POA: Diagnosis not present

## 2017-06-20 DIAGNOSIS — E042 Nontoxic multinodular goiter: Secondary | ICD-10-CM | POA: Diagnosis not present

## 2017-06-20 DIAGNOSIS — K219 Gastro-esophageal reflux disease without esophagitis: Secondary | ICD-10-CM | POA: Diagnosis not present

## 2017-06-20 DIAGNOSIS — F419 Anxiety disorder, unspecified: Secondary | ICD-10-CM | POA: Diagnosis not present

## 2017-06-20 DIAGNOSIS — Z23 Encounter for immunization: Secondary | ICD-10-CM | POA: Diagnosis not present

## 2017-06-20 DIAGNOSIS — Z7901 Long term (current) use of anticoagulants: Secondary | ICD-10-CM | POA: Diagnosis not present

## 2017-06-26 DIAGNOSIS — M810 Age-related osteoporosis without current pathological fracture: Secondary | ICD-10-CM | POA: Diagnosis not present

## 2017-06-26 DIAGNOSIS — M797 Fibromyalgia: Secondary | ICD-10-CM | POA: Diagnosis not present

## 2017-06-26 DIAGNOSIS — K219 Gastro-esophageal reflux disease without esophagitis: Secondary | ICD-10-CM | POA: Diagnosis not present

## 2017-06-26 DIAGNOSIS — G2581 Restless legs syndrome: Secondary | ICD-10-CM | POA: Diagnosis not present

## 2017-06-26 DIAGNOSIS — E782 Mixed hyperlipidemia: Secondary | ICD-10-CM | POA: Diagnosis not present

## 2017-06-26 DIAGNOSIS — Z0000 Encounter for general adult medical examination without abnormal findings: Secondary | ICD-10-CM | POA: Diagnosis not present

## 2017-06-26 DIAGNOSIS — K589 Irritable bowel syndrome without diarrhea: Secondary | ICD-10-CM | POA: Diagnosis not present

## 2017-06-26 DIAGNOSIS — E042 Nontoxic multinodular goiter: Secondary | ICD-10-CM | POA: Diagnosis not present

## 2017-06-26 DIAGNOSIS — F419 Anxiety disorder, unspecified: Secondary | ICD-10-CM | POA: Diagnosis not present

## 2017-06-26 DIAGNOSIS — F339 Major depressive disorder, recurrent, unspecified: Secondary | ICD-10-CM | POA: Diagnosis not present

## 2017-06-26 DIAGNOSIS — E559 Vitamin D deficiency, unspecified: Secondary | ICD-10-CM | POA: Diagnosis not present

## 2017-06-26 DIAGNOSIS — Z7901 Long term (current) use of anticoagulants: Secondary | ICD-10-CM | POA: Diagnosis not present

## 2017-06-26 DIAGNOSIS — Z8041 Family history of malignant neoplasm of ovary: Secondary | ICD-10-CM | POA: Diagnosis not present

## 2017-07-17 DIAGNOSIS — K625 Hemorrhage of anus and rectum: Secondary | ICD-10-CM | POA: Diagnosis not present

## 2017-07-17 DIAGNOSIS — Z7901 Long term (current) use of anticoagulants: Secondary | ICD-10-CM | POA: Diagnosis not present

## 2017-07-18 DIAGNOSIS — K625 Hemorrhage of anus and rectum: Secondary | ICD-10-CM | POA: Diagnosis not present

## 2017-08-10 DIAGNOSIS — Z7901 Long term (current) use of anticoagulants: Secondary | ICD-10-CM | POA: Diagnosis not present

## 2017-08-25 ENCOUNTER — Telehealth: Payer: Self-pay | Admitting: Gynecology

## 2017-08-25 NOTE — Telephone Encounter (Signed)
Prolia insurance verification has been sent awaiting Summary of benefits  

## 2017-09-07 DIAGNOSIS — Z7901 Long term (current) use of anticoagulants: Secondary | ICD-10-CM | POA: Diagnosis not present

## 2017-09-07 NOTE — Telephone Encounter (Signed)
Deductible MET  OOP MAX $3400($0 MEt)  Annual exam 05/29/17  Calcium             Date   Upcoming dental procedures   Prior Authorization needed NO  Pt estimated Cost $226       Coverage Details:20% of One dose Prolia. and 20% administration fee

## 2017-09-07 NOTE — Telephone Encounter (Signed)
Per patient she is having a family situations and cannot come in for labs or prolia at this time. She states she will call us when she is ready to proceed with prolia again I explained  we do need a recent calcium level she understands.

## 2017-09-11 NOTE — Telephone Encounter (Signed)
Will route to Elon Alas NP as well to inform her of this patient decision as far as waiting on Prolia

## 2017-09-14 DIAGNOSIS — Z7901 Long term (current) use of anticoagulants: Secondary | ICD-10-CM | POA: Diagnosis not present

## 2017-10-10 DIAGNOSIS — Z7901 Long term (current) use of anticoagulants: Secondary | ICD-10-CM | POA: Diagnosis not present

## 2017-10-26 ENCOUNTER — Encounter: Payer: Self-pay | Admitting: Gynecology

## 2017-10-30 ENCOUNTER — Other Ambulatory Visit: Payer: Self-pay | Admitting: *Deleted

## 2017-10-30 ENCOUNTER — Other Ambulatory Visit: Payer: PPO

## 2017-10-30 DIAGNOSIS — H2513 Age-related nuclear cataract, bilateral: Secondary | ICD-10-CM | POA: Diagnosis not present

## 2017-10-30 DIAGNOSIS — M810 Age-related osteoporosis without current pathological fracture: Secondary | ICD-10-CM

## 2017-10-30 DIAGNOSIS — R0609 Other forms of dyspnea: Secondary | ICD-10-CM

## 2017-10-30 DIAGNOSIS — R0600 Dyspnea, unspecified: Secondary | ICD-10-CM

## 2017-10-30 HISTORY — DX: Dyspnea, unspecified: R06.00

## 2017-10-30 HISTORY — DX: Other forms of dyspnea: R06.09

## 2017-10-30 LAB — CALCIUM: Calcium: 8.4 mg/dL — ABNORMAL LOW (ref 8.6–10.4)

## 2017-10-31 ENCOUNTER — Other Ambulatory Visit: Payer: Self-pay | Admitting: Women's Health

## 2017-11-07 DIAGNOSIS — Z7901 Long term (current) use of anticoagulants: Secondary | ICD-10-CM | POA: Diagnosis not present

## 2017-11-14 ENCOUNTER — Encounter (HOSPITAL_COMMUNITY): Payer: Self-pay | Admitting: Emergency Medicine

## 2017-11-14 ENCOUNTER — Emergency Department (HOSPITAL_COMMUNITY): Payer: PPO

## 2017-11-14 ENCOUNTER — Emergency Department (HOSPITAL_COMMUNITY)
Admission: EM | Admit: 2017-11-14 | Discharge: 2017-11-14 | Disposition: A | Discharge status: Home / Self Care | Payer: PPO | Attending: Emergency Medicine | Admitting: Emergency Medicine

## 2017-11-14 DIAGNOSIS — Z86711 Personal history of pulmonary embolism: Secondary | ICD-10-CM | POA: Diagnosis not present

## 2017-11-14 DIAGNOSIS — R21 Rash and other nonspecific skin eruption: Secondary | ICD-10-CM | POA: Diagnosis not present

## 2017-11-14 DIAGNOSIS — Z79899 Other long term (current) drug therapy: Secondary | ICD-10-CM | POA: Diagnosis not present

## 2017-11-14 DIAGNOSIS — R531 Weakness: Secondary | ICD-10-CM | POA: Diagnosis not present

## 2017-11-14 DIAGNOSIS — R0609 Other forms of dyspnea: Secondary | ICD-10-CM | POA: Diagnosis not present

## 2017-11-14 DIAGNOSIS — R0789 Other chest pain: Secondary | ICD-10-CM | POA: Insufficient documentation

## 2017-11-14 DIAGNOSIS — R110 Nausea: Secondary | ICD-10-CM | POA: Insufficient documentation

## 2017-11-14 DIAGNOSIS — Z7901 Long term (current) use of anticoagulants: Secondary | ICD-10-CM | POA: Insufficient documentation

## 2017-11-14 DIAGNOSIS — Z87891 Personal history of nicotine dependence: Secondary | ICD-10-CM | POA: Insufficient documentation

## 2017-11-14 DIAGNOSIS — K625 Hemorrhage of anus and rectum: Secondary | ICD-10-CM | POA: Diagnosis not present

## 2017-11-14 DIAGNOSIS — R9431 Abnormal electrocardiogram [ECG] [EKG]: Secondary | ICD-10-CM | POA: Diagnosis not present

## 2017-11-14 DIAGNOSIS — R5383 Other fatigue: Secondary | ICD-10-CM | POA: Diagnosis not present

## 2017-11-14 DIAGNOSIS — R001 Bradycardia, unspecified: Secondary | ICD-10-CM | POA: Diagnosis not present

## 2017-11-14 DIAGNOSIS — I499 Cardiac arrhythmia, unspecified: Secondary | ICD-10-CM | POA: Diagnosis not present

## 2017-11-14 LAB — I-STAT TROPONIN, ED: Troponin i, poc: 0 ng/mL (ref 0.00–0.08)

## 2017-11-14 LAB — URINALYSIS, ROUTINE W REFLEX MICROSCOPIC
Bilirubin Urine: NEGATIVE
Glucose, UA: NEGATIVE mg/dL
Hgb urine dipstick: NEGATIVE
Ketones, ur: NEGATIVE mg/dL
Leukocytes, UA: NEGATIVE
Nitrite: NEGATIVE
Protein, ur: NEGATIVE mg/dL
Specific Gravity, Urine: 1.004 — ABNORMAL LOW (ref 1.005–1.030)
pH: 7 (ref 5.0–8.0)

## 2017-11-14 LAB — BASIC METABOLIC PANEL
Anion gap: 9 (ref 5–15)
BUN: 15 mg/dL (ref 6–20)
CO2: 25 mmol/L (ref 22–32)
Calcium: 9.1 mg/dL (ref 8.9–10.3)
Chloride: 106 mmol/L (ref 101–111)
Creatinine, Ser: 0.73 mg/dL (ref 0.44–1.00)
GFR calc Af Amer: >60 mL/min (ref >60)
GFR calc non Af Amer: >60 mL/min (ref >60)
Glucose, Bld: 105 mg/dL — ABNORMAL HIGH (ref 65–99)
Potassium: 3.8 mmol/L (ref 3.5–5.1)
Sodium: 140 mmol/L (ref 135–145)

## 2017-11-14 LAB — CBC
HCT: 43.6 % (ref 36.0–46.0)
Hemoglobin: 14.5 g/dL (ref 12.0–15.0)
MCH: 30.8 pg (ref 26.0–34.0)
MCHC: 33.3 g/dL (ref 30.0–36.0)
MCV: 92.6 fL (ref 78.0–100.0)
Platelets: 225 10*3/uL (ref 150–400)
RBC: 4.71 MIL/uL (ref 3.87–5.11)
RDW: 13.5 % (ref 11.5–15.5)
WBC: 6.8 10*3/uL (ref 4.0–10.5)

## 2017-11-14 NOTE — Discharge Instructions (Signed)
As discussed, your evaluation today has been largely reassuring.  But, it is important that you monitor your condition carefully, and do not hesitate to return to the ED if you develop new, or concerning changes in your condition. ? ?Otherwise, please follow-up with your physician for appropriate ongoing care. ? ?

## 2017-11-14 NOTE — ED Triage Notes (Signed)
EMS report From [facility], patient went to PCP for fatigue. Doctor there concerned about EKG changes between 2012 and today. Denies chest pain. 20g saline lock in left AC.  Vitals 163/77 HR 53 R20 SpO2 99% room air

## 2017-11-14 NOTE — ED Provider Notes (Signed)
Patient placed in Quick Look pathway, seen and evaluated   Chief Complaint: Fatigue   HPI:   72 y.o. female who presents for evaluation of generalized fatigue and weakness times 1 week.  Patient reports she did have some chest pressure recently.  States it was not worse with deep inspiration but was worse with exertion.  Patient reports that she just feels rundown and weak all over.  Denies any focal weakness.  Patient denies any fevers.  She reports she has been nauseous but no vomiting.    ROS: fatigue  Physical Exam:   Gen: No distress  Neuro: Awake and Alert  Skin: Warm    Focused Exam:  Cranial nerves III-XII intact Follows commands, Moves all extremities  5/5 strength to BUE and BLE  Sensation intact throughout all major nerve distributions No slurred speech. No facial droop.    Initiation of care has begun. The patient has been counseled on the process, plan, and necessity for staying for the completion/evaluation, and the remainder of the medical screening examination   Desma Mcgregor 11/14/17 1508    Lajean Saver, MD 11/15/17 1257

## 2017-11-14 NOTE — ED Provider Notes (Signed)
[facility] EMERGENCY DEPARTMENT Provider Note   CSN: [id number] Arrival date & time: 11/14/17  1358     History   Chief Complaint Chief Complaint  Patient presents with  . Fatigue    HPI Emma Lopez is a 72 y.o. female.  HPI Presents with her cousin who assists with the HPI. Patient presents with concern of fatigue. Patient has a history of fibromyalgia, ongoing episodes of fatigue, but notes that she is doing generally well until recently, and over the past week she has had increasing generalized symptoms, without focal weakness, without syncope, without sustained pain, without fever, without chills. No recent medication changes. Today, with these concerns with primary care, and after she was found to have a possible abnormal EKG she was sent here for evaluation. She denies ongoing chest pain, denies cardiac history. She has no history of echocardiogram per She has been following up with rheumatology as well as primary care. She notes substantial life stress including the death of her sister, recently.  Past Medical History:  Diagnosis Date  . Anxiety   . Arthritis    oa  . CIN III (cervical intraepithelial neoplasia III) 2000  . Complication of anesthesia    did well last 2 times with procedures  . Depression   . 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 DVT (deep venous thrombosis) ([place]) 01/2009   LEFT LEG  . Elevated triglycerides with high cholesterol   . Endometriosis   . Family history of breast cancer 04/17/2017  . Family history of colon cancer 04/17/2017  . Family history of melanoma 04/17/2017  . Family history of ovarian cancer 04/17/2017  . Family history of pancreatic cancer 04/17/2017  . Fibromyalgia   . GERD (gastroesophageal reflux disease)   . History of hiatal hernia    told by some md has, some say not  . Osteoporosis 03/2015   T score -3.1 stable from prior study  . PE (pulmonary embolism)   . PONV (postoperative nausea and vomiting)   .  Restless legs syndrome   . Thyroid disease    HYPERTHYROIDISM    Patient Active Problem List   Diagnosis Date Noted  . Family history of ovarian cancer 04/17/2017  . Family history of breast cancer 04/17/2017  . Family history of colon cancer 04/17/2017  . Family history of pancreatic cancer 04/17/2017  . Family history of melanoma 04/17/2017  . Genetic testing 04/17/2017  . CIN III (cervical intraepithelial neoplasia III)   . Arthritis   . Fibromyalgia   . PE (pulmonary embolism)   . Thyroid disease   . Osteoporosis   . DVT (deep venous thrombosis) ([place]) 04/01/2004    Past Surgical History:  Procedure Laterality Date  . ABDOMINAL HYSTERECTOMY  2001   TAH, partial  . APPENDECTOMY  1978  . COLONOSCOPY WITH PROPOFOL N/A 12/07/2015   Procedure: COLONOSCOPY WITH PROPOFOL;  Surgeon: Garlan Fair, MD;  Location: [facility] END[facility]COPY;  Service: Endoscopy;  Laterality: N/A;  . colonscopy  7 yrs ago   other in past  . ESOPHAGOGASTRODUODENOSCOPY (EGD) WITH PROPOFOL N/A 12/07/2015   Procedure: ESOPHAGOGASTRODUODENOSCOPY (EGD) WITH PROPOFOL;  Surgeon: Garlan Fair, MD;  Location: [facility] ENDOSCOPY;  Service: Endoscopy;  Laterality: N/A;  . ESOPHAGOGASTRODUODENOSCOPY ENDOSCOPY  yrs ago  . FACIAL COSMETIC SURGERY    . GYNECOLOGIC CRYOSURGERY    . MYOMECTOMY    . TONSILLECTOMY       OB History    Gravida  0   Para  0   Term      Preterm      AB      Living        SAB      TAB      Ectopic      Multiple      Live Births               Home Medications    Prior to Admission medications   Medication Sig Start Date End Date Taking? Authorizing Provider  ALPRAZolam (XANAX) 0.25 MG tablet Take 1 tablet (0.25 mg total) by mouth every 8 (eight) hours as needed for anxiety. 04/05/17   Huel Cote, NP  Calcium Carb-Cholecalciferol (CALCIUM + D3 PO) Take 1 tablet by mouth 2 (two) times daily.    [provider]  denosumab (PROLIA) 60 MG/ML SOLN injection Inject 60 mg  into the skin every 6 (six) months. Administer in upper arm, thigh, or abdomen 03/05/15   Huel Cote, NP  escitalopram (LEXAPRO) 20 MG tablet Take 20 mg by mouth daily.      [provider]  fish oil-omega-3 fatty acids 1000 MG capsule Take 2 g by mouth daily.      [provider]  Multiple Vitamins-Minerals (MULTIVITAMIN PO) Take 1 tablet by mouth daily.     [provider]  rOPINIRole (REQUIP) 1 MG tablet Take 1 mg by mouth at bedtime.     [provider]  warfarin (COUMADIN) 1 MG tablet Take 0.5-1 mg by mouth every evening. Takes half a tablet on Tuesday, Thursday, Saturday, "Sunday and one tablet on Monday, Wednesday, Friday.    [provider]    Family History Family History  Problem Relation Age of Onset  . Hypertension Mother   . Breast cancer Mother        70's  . Cancer Father        COLON  . Hypertension Sister   . Stroke Sister   . Breast cancer Maternal Aunt        Age 70's  . Cancer Maternal Aunt        Melanoma  . Alzheimer's disease Maternal Aunt   . Cancer Paternal Aunt        OVARIAN and COLON  . Breast cancer Maternal Aunt        70" 's  . Cancer Maternal Aunt        Colon CA  . Alzheimer's disease Maternal Aunt     Social History Social History   Tobacco Use  . Smoking status: Former Smoker    Packs/day: 0.10    Years: 6.00    Pack years: 0.60    Types: Cigarettes    Last attempt to quit: 08/01/1978    Years since quitting: 39.3  . Smokeless tobacco: Never Used  . Tobacco comment: only smoked 4-6 yrs 1 pack per month  Substance Use Topics  . Alcohol use: Yes    Alcohol/week: 0.0 oz    Comment: very rare  . Drug use: No     Allergies   Sulfa antibiotics   Review of Systems Review of Systems  Constitutional:       Per HPI, otherwise negative  HENT:       Per HPI, otherwise negative  Respiratory:       Per HPI, otherwise negative  Cardiovascular:       Per HPI, otherwise negative    Gastrointestinal: Negative for vomiting.  Endocrine:  Negative aside from HPI  Genitourinary:       Neg aside from HPI   Musculoskeletal:       Per HPI, otherwise negative  Skin: Negative.   Neurological: Positive for weakness. Negative for syncope.     Physical Exam Updated Vital Signs BP (!) 165/68   Pulse (!) 56   Temp 98.8 F (37.1 C) (Oral)   Resp 14   SpO2 100%   Physical Exam  Constitutional: She is oriented to person, place, and time. She appears well-developed and well-nourished. No distress.  HENT:  Head: Normocephalic and atraumatic.  Eyes: Conjunctivae and EOM are normal.  Cardiovascular: Normal rate and regular rhythm.  Pulmonary/Chest: Effort normal and breath sounds normal. No stridor. No respiratory distress.  Abdominal: She exhibits no distension.  Musculoskeletal: She exhibits no edema.  Neurological: She is alert and oriented to person, place, and time. No cranial nerve deficit.  Skin: Skin is warm and dry.  Psychiatric: She has a normal mood and affect.  Nursing note and vitals reviewed.    ED Treatments / Results  Labs (all labs ordered are listed, but only abnormal results are displayed) Labs Reviewed  BASIC METABOLIC PANEL - Abnormal; Notable for the following components:      Result Value   Glucose, Bld 105 (*)    All other components within normal limits  URINALYSIS, ROUTINE W REFLEX MICROSCOPIC - Abnormal; Notable for the following components:   Color, Urine STRAW (*)    Specific Gravity, Urine 1.004 (*)    All other components within normal limits  CBC  I-STAT TROPONIN, ED    EKG EKG Interpretation  Date/Time:  Tuesday November 14 2017 14:08:06 EDT Ventricular Rate:  51 PR Interval:  150 QRS Duration: 88 QT Interval:  442 QTC Calculation: 407 R Axis:   70 Text Interpretation:  Sinus bradycardia Nonspecific ST abnormality Abnormal ECG Abnormal ekg Confirmed by Carmin Muskrat [phone]) on 11/14/2017 8:39:28  PM   Radiology Dg Chest 2 View  Result Date: 11/14/2017 CLINICAL DATA:  Abnormal electrocardiogram.  Fatigue and nausea EXAM: CHEST - 2 VIEW COMPARISON:  November 21, 2011 FINDINGS: There is no edema or consolidation. The heart size and pulmonary vascularity are normal. No adenopathy. No bone lesions. IMPRESSION: No edema or consolidation. Electronically Signed   By: Lowella Grip III M.D.   On: 11/14/2017 15:27    Procedures Procedures (including critical care time)  Medications Ordered in ED Medications - No data to display   Initial Impression / Assessment and Plan / ED Course  I have reviewed the triage vital signs and the nursing notes.  Pertinent labs & imaging results that were available during my care of the patient were reviewed by me and considered in my medical decision making (see chart for details).  Patient presents with ongoing generalized fatigue. On exam she is awake, alert, afebrile, hemodynamically unremarkable, with no neurologic deficits, no focal ongoing complaints. Labs reassuring, x-ray reassuring, EKG reassuring, no evidence for ongoing coronary ischemia, bacteremia, sepsis. Given the patient's history of fibromyalgia she was encouraged to follow-up with primary care. Given the new fatigue, possible EKG change, she will also follow-up with cardiology, for consideration of echocardiogram. Patient discharged in stable condition.  Final Clinical Impressions(s) / ED Diagnoses   Final diagnoses:  Fatigue, unspecified type    ED Discharge Orders    None       Carmin Muskrat, MD 11/14/17 2124

## 2017-11-14 NOTE — ED Triage Notes (Signed)
Patient complains of fatigue x1 week and 1/10 chest pressure. States she was recently diagnosed with fibromyalgia but believes she has had it for a long time. 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 States after her sister died she felt like she was "going up and down" but starting last week feels like she is extremely fatigued.

## 2017-11-16 ENCOUNTER — Emergency Department (HOSPITAL_COMMUNITY)
Admission: EM | Admit: 2017-11-16 | Discharge: 2017-11-17 | Disposition: A | Discharge status: Home / Self Care | Payer: PPO | Attending: Emergency Medicine | Admitting: Emergency Medicine

## 2017-11-16 ENCOUNTER — Telehealth: Payer: Self-pay

## 2017-11-16 ENCOUNTER — Emergency Department (HOSPITAL_COMMUNITY): Payer: PPO

## 2017-11-16 ENCOUNTER — Encounter (HOSPITAL_COMMUNITY): Payer: Self-pay | Admitting: Emergency Medicine

## 2017-11-16 DIAGNOSIS — R202 Paresthesia of skin: Secondary | ICD-10-CM | POA: Diagnosis not present

## 2017-11-16 DIAGNOSIS — Z87891 Personal history of nicotine dependence: Secondary | ICD-10-CM | POA: Insufficient documentation

## 2017-11-16 DIAGNOSIS — Z7982 Long term (current) use of aspirin: Secondary | ICD-10-CM | POA: Insufficient documentation

## 2017-11-16 DIAGNOSIS — M549 Dorsalgia, unspecified: Secondary | ICD-10-CM | POA: Diagnosis not present

## 2017-11-16 DIAGNOSIS — R079 Chest pain, unspecified: Secondary | ICD-10-CM | POA: Insufficient documentation

## 2017-11-16 DIAGNOSIS — Z7901 Long term (current) use of anticoagulants: Secondary | ICD-10-CM | POA: Diagnosis not present

## 2017-11-16 DIAGNOSIS — R0789 Other chest pain: Secondary | ICD-10-CM | POA: Diagnosis not present

## 2017-11-16 DIAGNOSIS — Z79899 Other long term (current) drug therapy: Secondary | ICD-10-CM | POA: Insufficient documentation

## 2017-11-16 LAB — I-STAT TROPONIN, ED: Troponin i, poc: 0.01 ng/mL (ref 0.00–0.08)

## 2017-11-16 LAB — BASIC METABOLIC PANEL
Anion gap: 9 (ref 5–15)
BUN: 18 mg/dL (ref 6–20)
CO2: 24 mmol/L (ref 22–32)
Calcium: 8.8 mg/dL — ABNORMAL LOW (ref 8.9–10.3)
Chloride: 103 mmol/L (ref 101–111)
Creatinine, Ser: 0.71 mg/dL (ref 0.44–1.00)
GFR calc Af Amer: >60 mL/min (ref >60)
GFR calc non Af Amer: >60 mL/min (ref >60)
Glucose, Bld: 97 mg/dL (ref 65–99)
Potassium: 3.7 mmol/L (ref 3.5–5.1)
Sodium: 136 mmol/L (ref 135–145)

## 2017-11-16 LAB — CBC
HCT: 39.9 % (ref 36.0–46.0)
Hemoglobin: 13.3 g/dL (ref 12.0–15.0)
MCH: 31.2 pg (ref 26.0–34.0)
MCHC: 33.3 g/dL (ref 30.0–36.0)
MCV: 93.7 fL (ref 78.0–100.0)
Platelets: 230 10*3/uL (ref 150–400)
RBC: 4.26 MIL/uL (ref 3.87–5.11)
RDW: 13.6 % (ref 11.5–15.5)
WBC: 6.6 10*3/uL (ref 4.0–10.5)

## 2017-11-16 NOTE — ED Triage Notes (Addendum)
Per EMS- pt has had over 1 week of weakness, evaluated for this 4/16. Pt reports while at rest this morning had gradual onset of chest pressure and intermittent numbness and tingling to left arm. Denies N/V/SOB. Pain 3/10. 324 Asprin. Nitro X 3 with No relief. 20G to LAC. CBG 104.   *Pt states this chest pain is not new and that it started when she was here Tuesday waiting for hours in the lobby.

## 2017-11-16 NOTE — Telephone Encounter (Signed)
Attached notes received to May 6th file.

## 2017-11-17 ENCOUNTER — Other Ambulatory Visit: Payer: Self-pay

## 2017-11-17 ENCOUNTER — Emergency Department (HOSPITAL_COMMUNITY): Payer: PPO

## 2017-11-17 DIAGNOSIS — R0789 Other chest pain: Secondary | ICD-10-CM | POA: Diagnosis not present

## 2017-11-17 LAB — PROTIME-INR
INR: 2.81
Prothrombin Time: 29.4 seconds — ABNORMAL HIGH (ref 11.4–15.2)

## 2017-11-17 LAB — I-STAT TROPONIN, ED: Troponin i, poc: 0 ng/mL (ref 0.00–0.08)

## 2017-11-17 MED ORDER — IOPAMIDOL (ISOVUE-370) INJECTION 76%
INTRAVENOUS | Status: AC
  Filled 2017-11-17: qty 100

## 2017-11-17 MED ORDER — IOPAMIDOL (ISOVUE-370) INJECTION 76%
100.0000 mL | Freq: Once | INTRAVENOUS | Status: AC | PRN
  Administered 2017-11-17: 100 mL via INTRAVENOUS

## 2017-11-17 NOTE — ED Notes (Signed)
Patient transported to CT 

## 2017-11-17 NOTE — ED Provider Notes (Signed)
[facility] EMERGENCY DEPARTMENT Provider Note   CSN: [id number] Arrival date & time: 11/16/17  1704     History   Chief Complaint Chief Complaint  Patient presents with  . Fatigue  . Chest Pain    HPI Emma Lopez is a 71 y.o. female.  Patient presents to the ER for evaluation of chest pain.  Patient reports that symptoms began earlier today.  She has a pain in the left chest that radiates through into her back.  She has noticed intermittent numbness and tingling of her left arm in association with the symptoms.  She has not had associated nausea or vomiting.  Patient denies shortness of breath.  She was given nitroglycerin prehospital without any relief in her pain.  She has been given aspirin as well.  Patient was here in the ER 2 days ago because she had been feeling fatigued for some time and saw her doctor.  An EKG at that time showed changes from her previous EKGs and she was referred to the ER.  Her workup at that time was negative.  She had some chest pain while in the waiting room but that had resolved by the time she was evaluated.     Past Medical History:  Diagnosis Date  . Anxiety   . Arthritis    oa  . CIN III (cervical intraepithelial neoplasia III) 2000  . Complication of anesthesia    did well last 2 times with procedures  . Depression   . 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 DVT (deep venous thrombosis) ([place]) 01/2009   LEFT LEG  . Elevated triglycerides with high cholesterol   . Endometriosis   . Family history of breast cancer 04/17/2017  . Family history of colon cancer 04/17/2017  . Family history of melanoma 04/17/2017  . Family history of ovarian cancer 04/17/2017  . Family history of pancreatic cancer 04/17/2017  . Fibromyalgia   . GERD (gastroesophageal reflux disease)   . History of hiatal hernia    told by some md has, some say not  . Osteoporosis 03/2015   T score -3.1 stable from prior study  . PE (pulmonary embolism)   . PONV  (postoperative nausea and vomiting)   . Restless legs syndrome   . Thyroid disease    HYPERTHYROIDISM    Patient Active Problem List   Diagnosis Date Noted  . Family history of ovarian cancer 04/17/2017  . Family history of breast cancer 04/17/2017  . Family history of colon cancer 04/17/2017  . Family history of pancreatic cancer 04/17/2017  . Family history of melanoma 04/17/2017  . Genetic testing 04/17/2017  . CIN III (cervical intraepithelial neoplasia III)   . Arthritis   . Fibromyalgia   . PE (pulmonary embolism)   . Thyroid disease   . Osteoporosis   . DVT (deep venous thrombosis) ([place]) 04/01/2004    Past Surgical History:  Procedure Laterality Date  . ABDOMINAL HYSTERECTOMY  2001   TAH, partial  . APPENDECTOMY  1978  . COLONOSCOPY WITH PROPOFOL N/A 12/07/2015   Procedure: COLONOSCOPY WITH PROPOFOL;  Surgeon: Garlan Fair, MD;  Location: [facility];  Service: Endoscopy;  Laterality: N/A;  . colonscopy  7 yrs ago   other in past  . ESOPHAGOGASTRODUODENOSCOPY (EGD) WITH PROPOFOL N/A 12/07/2015   Procedure: ESOPHAGOGASTRODUODENOSCOPY (EGD) WITH PROPOFOL;  Surgeon: Garlan Fair, MD;  Location: [facility];  Service: Endoscopy;  Laterality: N/A;  . ESOPHAGOGASTRODUODENOSCOPY ENDOSCOPY  yrs ago  .  FACIAL COSMETIC SURGERY    . GYNECOLOGIC CRYOSURGERY    . MYOMECTOMY    . TONSILLECTOMY       OB History    Gravida  0   Para  0   Term      Preterm      AB      Living        SAB      TAB      Ectopic      Multiple      Live Births               Home Medications    Prior to Admission medications   Medication Sig Start Date End Date Taking? Authorizing Provider  acetaminophen (TYLENOL) 325 MG tablet Take 650 mg by mouth every 6 (six) hours as needed for moderate pain.   Yes [provider]  ALPRAZolam (XANAX) 0.25 MG tablet Take 1 tablet (0.25 mg total) by mouth every 8 (eight) hours as needed for anxiety. 04/05/17  Yes Huel Cote, NP  aspirin EC 81 MG tablet Take 324 mg by mouth once.   Yes [provider]  B Complex Vitamins (B COMPLEX 1 PO) Take 1 each by mouth 2 (two) times daily. sublingual   Yes [provider]  denosumab (PROLIA) 60 MG/ML SOLN injection Inject 60 mg into the skin every 6 (six) months. Administer in upper arm, thigh, or abdomen 03/05/15  Yes Huel Cote, NP  escitalopram (LEXAPRO) 20 MG tablet Take 20 mg by mouth daily.     Yes [provider]  fish oil-omega-3 fatty acids 1000 MG capsule Take 2 g by mouth daily.     Yes [provider]  fluticasone (FLONASE) 50 MCG/ACT nasal spray USE 2 SPRAYS INTO EACH NOSTRIL AS NEEDED ALLERGIES. 08/14/17  Yes [provider]  Multiple Vitamins-Minerals (HAIR SKIN AND NAILS FORMULA PO) Take 2 tablets by mouth daily.   Yes [provider]  Multiple Vitamins-Minerals (MULTIVITAMIN PO) Take 1 tablet by mouth 2 (two) times daily.    Yes [provider]  rOPINIRole (REQUIP) 1 MG tablet Take 1 mg by mouth at bedtime.    Yes [provider]  warfarin (COUMADIN) 1 MG tablet Take 0.5-1 mg by mouth every evening. Takes half a tablet on Tuesday, Thursday, Saturday, "Sunday and one tablet on Monday, Wednesday, Friday.   Yes [provider]    Family History Family History  Problem Relation Age of Onset  . Hypertension Mother   . Breast cancer Mother        70's  . Cancer Father        COLON  . Hypertension Sister   . Stroke Sister   . Breast cancer Maternal Aunt        Age 70's  . Cancer Maternal Aunt        Melanoma  . Alzheimer's disease Maternal Aunt   . Cancer Paternal Aunt        OVARIAN and COLON  . Breast cancer Maternal Aunt        70" 's  . Cancer Maternal Aunt        Colon CA  . Alzheimer's disease Maternal Aunt     Social History Social History   Tobacco Use  . Smoking status: Former Smoker    Packs/day: 0.10    Years: 6.00    Pack years: 0.60    Types:  Cigarettes    Last attempt to quit:  08/01/1978    Years since quitting: 39.3  . Smokeless tobacco: Never Used  . Tobacco comment: only smoked 4-6 yrs 1 pack per month  Substance Use Topics  . Alcohol use: Yes    Alcohol/week: 0.0 oz    Comment: very rare  . Drug use: No     Allergies   Sulfa antibiotics and Warfarin and related   Review of Systems Review of Systems  Respiratory: Negative for shortness of breath.   Cardiovascular: Positive for chest pain.  All other systems reviewed and are negative.    Physical Exam Updated Vital Signs BP (!) 110/49   Pulse (!) 57   Temp 98 F (36.7 C) (Oral)   Resp 18   Ht 5\' 2"  (1.575 m)   Wt 61.2 kg (135 lb)   SpO2 96%   BMI 24.69 kg/m   Physical Exam  Constitutional: She is oriented to person, place, and time. She appears well-developed and well-nourished. No distress.  HENT:  Head: Normocephalic and atraumatic.  Right Ear: Hearing normal.  Left Ear: Hearing normal.  Nose: Nose normal.  Mouth/Throat: Oropharynx is clear and moist and mucous membranes are normal.  Eyes: Pupils are equal, round, and reactive to light. Conjunctivae and EOM are normal.  Neck: Normal range of motion. Neck supple.  Cardiovascular: Regular rhythm, S1 normal and S2 normal. Exam reveals no gallop and no friction rub.  No murmur heard. Pulmonary/Chest: Effort normal and breath sounds normal. No respiratory distress. She exhibits no tenderness.  Abdominal: Soft. Normal appearance and bowel sounds are normal. There is no hepatosplenomegaly. There is no tenderness. There is no rebound, no guarding, no tenderness at McBurney's point and negative Murphy's sign. No hernia.  Musculoskeletal: Normal range of motion.  Neurological: She is alert and oriented to person, place, and time. She has normal strength. No cranial nerve deficit or sensory deficit. Coordination normal. GCS eye subscore is 4. GCS verbal subscore is 5. GCS motor subscore is 6.  Skin: Skin is  warm, dry and intact. No rash noted. No cyanosis.  Psychiatric: She has a normal mood and affect. Her speech is normal and behavior is normal. Thought content normal.  Nursing note and vitals reviewed.    ED Treatments / Results  Labs (all labs ordered are listed, but only abnormal results are displayed) Labs Reviewed  BASIC METABOLIC PANEL - Abnormal; Notable for the following components:      Result Value   Calcium 8.8 (*)    All other components within normal limits  PROTIME-INR - Abnormal; Notable for the following components:   Prothrombin Time 29.4 (*)    All other components within normal limits  CBC  I-STAT TROPONIN, ED  I-STAT TROPONIN, ED    EKG EKG Interpretation  Date/Time:  Thursday November 16 2017 17:05:03 EDT Ventricular Rate:  53 PR Interval:  134 QRS Duration: 92 QT Interval:  418 QTC Calculation: 392 R Axis:   67 Text Interpretation:  Sinus bradycardia Septal infarct , age undetermined Abnormal ECG No significant change since last tracing Confirmed by Orpah Greek [phone]) on 11/17/2017 3:44:59 AM   Radiology Dg Chest 2 View  Result Date: 11/16/2017 CLINICAL DATA:  LEFT-sided chest pressure. EXAM: CHEST - 2 VIEW COMPARISON:  11/14/2017. FINDINGS: The heart size and mediastinal contours are within normal limits. Both lungs are clear. The visualized skeletal structures are unremarkable. IMPRESSION: No active cardiopulmonary disease.  No change. Electronically Signed   By: Staci Righter M.D.   On: 11/16/2017  18:21   Ct Angio Chest Pe W Or Wo Contrast  Result Date: 11/17/2017 CLINICAL DATA:  Acute onset of generalized weakness and chest pressure. Numbness and tingling at the left arm. EXAM: CT ANGIOGRAPHY CHEST WITH CONTRAST TECHNIQUE: Multidetector CT imaging of the chest was performed using the standard protocol during bolus administration of intravenous contrast. Multiplanar CT image reconstructions and MIPs were obtained to evaluate the vascular  anatomy. CONTRAST:  162mL ISOVUE-370 IOPAMIDOL (ISOVUE-370) INJECTION 76% COMPARISON:  CTA of the chest performed 02/27/2009 FINDINGS: Cardiovascular:  There is no evidence of pulmonary embolus. The heart is normal in size. The thoracic aorta is grossly unremarkable in appearance. The great vessels are within normal limits. Mediastinum/Nodes: The mediastinum is unremarkable in appearance. No mediastinal lymphadenopathy is seen. No pericardial effusion is identified. The visualized portions of the thyroid gland are unremarkable. No axillary lymphadenopathy is seen. Lungs/Pleura: Minimal bibasilar atelectasis or scarring is noted. The lungs are otherwise clear. No pleural effusion or pneumothorax is seen. No masses are identified. Upper Abdomen: The visualized portions of the liver and spleen are unremarkable. The visualized portions of the gallbladder, pancreas, adrenal glands and kidneys are within normal limits. Musculoskeletal: No acute osseous abnormalities are identified. The visualized musculature is unremarkable in appearance. Review of the MIP images confirms the above findings. IMPRESSION: 1. No evidence of pulmonary embolus. 2. Minimal bibasilar atelectasis or scarring. Lungs otherwise clear. Electronically Signed   By: Garald Balding M.D.   On: 11/17/2017 04:48    Procedures Procedures (including critical care time)  Medications Ordered in ED Medications  iopamidol (ISOVUE-370) 76 % injection 100 mL (100 mLs Intravenous Contrast Given 11/17/17 0420)     Initial Impression / Assessment and Plan / ED Course  I have reviewed the triage vital signs and the nursing notes.  Pertinent labs & imaging results that were available during my care of the patient were reviewed by me and considered in my medical decision making (see chart for details).     Patient is well-appearing.  She does not have any known coronary artery disease.  She has been having pain for half a day.  EKG at arrival to the ER  today does not show any evidence of ischemia or infarct, unchanged from previous.  Troponin was negative.  Pain is not obviously reproducible.  It was not exacerbated by exertion, seems atypical for cardiac pain.  She does have a history of DVT and PE.  She was therefore sent for CT angiography of chest.  This was negative for PE.  No aortic abnormalities.  A second troponin, 10 hours after the first, is negative.  She has had pain for nearly one full day with negative workup.  I do not believe she requires hospitalization at this time, can follow-up with PCP.  If symptoms continue she might need outpatient stress test.  If she has any worsening symptoms, she will return to the ER.  Final Clinical Impressions(s) / ED Diagnoses   Final diagnoses:  Chest pain, unspecified type    ED Discharge Orders    None       Orpah Greek, MD 11/17/17 [phone]

## 2017-11-17 NOTE — ED Notes (Signed)
Pt states she is willingly to come to a Hallway bed in order to see a physician.

## 2017-11-20 DIAGNOSIS — R0789 Other chest pain: Secondary | ICD-10-CM | POA: Diagnosis not present

## 2017-11-20 DIAGNOSIS — F439 Reaction to severe stress, unspecified: Secondary | ICD-10-CM | POA: Diagnosis not present

## 2017-11-21 DIAGNOSIS — K219 Gastro-esophageal reflux disease without esophagitis: Secondary | ICD-10-CM | POA: Insufficient documentation

## 2017-11-21 DIAGNOSIS — R9431 Abnormal electrocardiogram [ECG] [EKG]: Secondary | ICD-10-CM | POA: Insufficient documentation

## 2017-12-04 ENCOUNTER — Ambulatory Visit (INDEPENDENT_AMBULATORY_CARE_PROVIDER_SITE_OTHER): Payer: PPO | Admitting: Cardiology

## 2017-12-04 ENCOUNTER — Encounter: Payer: Self-pay | Admitting: Cardiology

## 2017-12-04 ENCOUNTER — Encounter: Payer: Self-pay | Admitting: *Deleted

## 2017-12-04 VITALS — BP 140/72 | HR 51 | Ht 62.0 in | Wt 133.8 lb

## 2017-12-04 DIAGNOSIS — R0600 Dyspnea, unspecified: Secondary | ICD-10-CM

## 2017-12-04 DIAGNOSIS — R9431 Abnormal electrocardiogram [ECG] [EKG]: Secondary | ICD-10-CM | POA: Diagnosis not present

## 2017-12-04 DIAGNOSIS — R0609 Other forms of dyspnea: Secondary | ICD-10-CM | POA: Diagnosis not present

## 2017-12-04 NOTE — Patient Instructions (Addendum)
Medication Instructions:   Your physician recommends that you continue on your current medications as directed. Please refer to the Current Medication list given to you today.   If you need a refill on your cardiac medications before your next appointment, please call your pharmacy.  Labwork:  NONE ORDERED  TODAY    Testing/Procedures: Your physician has requested that you have en exercise stress myoview. For further information please visit [email or web address]. Please follow instruction sheet, as given.  Your physician has requested that you have an echocardiogram. Echocardiography is a painless test that uses sound waves to create images of your heart. It provides your doctor with information about the size and shape of your heart and how well your heart's chambers and valves are working. This procedure takes approximately one hour. There are no restrictions for this procedure.    Follow-Up:  2 TO 3 WEEKS AFTER TESTING    Any Other Special Instructions Will Be Listed Below (If Applicable).

## 2017-12-04 NOTE — Addendum Note (Signed)
Addended by: Claude Manges on: 12/04/2017 01:18 PM   Modules accepted: Orders

## 2017-12-04 NOTE — Progress Notes (Signed)
12/04/2017 Emma Lopez   Dec 09, 1945  [id number]  Primary Physician Leighton Ruff, MD Primary Cardiologist: Dr. Marlou Porch (New)  Reason for Visit/CC: New pt Evaluation for Exertional Dyspnea/ Fatigue and Abnormal EKG  HPI:  Emma Lopez is a 72 y.o. female who is being seen today, as a new pt, for the evaluation for abnormal EKG, Exertional Fatigue and Exertional dyspnea, at the request of her PCP, Dr. [name], with [facility].   She was seen several years ago by Dr. Marlou Porch, when he was with [facility], and has requested to re establish care with him.  She was seen at that time for atypical chest pain which, by patient's report, was reproducible with palpation of the chest wall and she notes that Dr. Marlou Porch felt that her pain was secondary to her fibromyalgia, which she has a long history of.  No further cardiac work-up was advised at that time.  In addition to fibromyalgia, her other medical history is notable for borderline hyperlipidemia, history of DVT and bilateral PE for which she is on chronic lifelong Coumadin therapy, GERD, osteoporosis and anxiety.  There is family history of coronary disease however her father was in his 16s when he had his first MI.  She notes remote history of tobacco use.  There is no personal history of hypertension or diabetes.  She reports that her sister was diagnosed with ovarian cancer in the summer 2018 and recently passed away in 2017-10-23.  This was a very stressful situation for her as she was very active in her care.  Since her passing, she reports that she has been more fatigued than usual and also notes exertional dyspnea.  No exertional chest pain.  Given her symptoms, she initially thought that perhaps her hemoglobin was low so she set up an appointment to see Dr. [name].  She reports that she was told her hemoglobin was normal.  An EKG was obtained at her PCP office and was read as abnormal with nonspecific ST depression.  Heart rate was 57 bpm.   Subsequently, she was referred back to our office for further cardiac evaluation.  Her EKG in clinic today shows sinus bradycardia 51 bpm, otherwise normal EKG.  No ST changes are noted on today's EKG.  As outlined above, she has had increased fatigue and exertional dyspnea.  No exertional chest pain.  She denies dizziness, no lightheadedness, syncope or near syncope. She is not on any AV nodal blocking agents.    Current Meds  Medication Sig  . acetaminophen (TYLENOL) 325 MG tablet Take 650 mg by mouth every 6 (six) hours as needed for moderate pain.  Marland Kitchen ALPRAZolam (XANAX) 0.25 MG tablet Take 1 tablet (0.25 mg total) by mouth every 8 (eight) hours as needed for anxiety.  . B Complex Vitamins (B COMPLEX 1 PO) Take 1 each by mouth 2 (two) times daily. sublingual  . denosumab (PROLIA) 60 MG/ML SOLN injection Inject 60 mg into the skin every 6 (six) months. Administer in upper arm, thigh, or abdomen  . escitalopram (LEXAPRO) 20 MG tablet Take 20 mg by mouth daily.    . fish oil-omega-3 fatty acids 1000 MG capsule Take 2 g by mouth daily.    . fluticasone (FLONASE) 50 MCG/ACT nasal spray USE 2 SPRAYS INTO EACH NOSTRIL AS NEEDED ALLERGIES.  . Multiple Vitamins-Minerals (HAIR SKIN AND NAILS FORMULA PO) Take 2 tablets by mouth daily.  . Multiple Vitamins-Minerals (MULTIVITAMIN PO) Take 1 tablet by mouth 2 (two) times daily.   Marland Kitchen  rOPINIRole (REQUIP) 1 MG tablet Take 1 mg by mouth at bedtime.   Marland Kitchen warfarin (COUMADIN) 1 MG tablet Take 0.5-1 mg by mouth every evening. Takes half a tablet on Tuesday, Thursday, Saturday, "Sunday and one tablet on Monday, Wednesday, Friday.   Allergies  Allergen Reactions  . Sulfa Antibiotics Rash  . Warfarin And Related Rash   Past Medical History:  Diagnosis Date  . Abnormal EKG   . Anxiety   . Arthritis    oa  . CIN III (cervical intraepithelial neoplasia III) 2000  . Complication of anesthesia    did well last 2 times with procedures  . Depression   . DES exposure  in utero   . DVT (deep venous thrombosis) ([facility]) 01/2009   LEFT LEG  . Dyspnea on exertion 10/2017  . Elevated triglycerides with high cholesterol   . Endometriosis   . Family history of breast cancer 04/17/2017  . Family history of colon cancer 04/17/2017  . Family history of melanoma 04/17/2017  . Family history of ovarian cancer 04/17/2017  . Family history of pancreatic cancer 04/17/2017  . Fibromyalgia   . GERD (gastroesophageal reflux disease)   . History of colon polyps   . History of hiatal hernia    told by some md has, some say not  . IBS (irritable bowel syndrome) 2015  . Multinodular goiter   . Osteoporosis 03/2015   T score -3.1 stable from prior study  . PE (pulmonary embolism)   . PONV (postoperative nausea and vomiting)   . Restless legs syndrome   . Sleep disorder   . Thyroid disease    HYPERTHYROIDISM  . Vitamin D deficiency    Family History  Problem Relation Age of Onset  . Hypertension Mother   . Breast cancer Mother        70's  . Cancer Father        COLON  . Hypertension Sister   . Stroke Sister   . Breast cancer Maternal Aunt        Age 70's  . Cancer Maternal Aunt        Melanoma  . Alzheimer's disease Maternal Aunt   . Cancer Paternal Aunt        OVARIAN and COLON  . Breast cancer Maternal Aunt        70" 's  . Cancer Maternal Aunt        Colon CA  . Alzheimer's disease Maternal Aunt    Past Surgical History:  Procedure Laterality Date  . ABDOMINAL HYSTERECTOMY  2001   TAH, partial  . APPENDECTOMY  1978  . COLONOSCOPY WITH PROPOFOL N/A 12/07/2015   Procedure: COLONOSCOPY WITH PROPOFOL;  Surgeon: Garlan Fair, MD;  Location: [facility];  Service: Endoscopy;  Laterality: N/A;  . colonscopy  7 yrs ago   other in past  . ESOPHAGOGASTRODUODENOSCOPY (EGD) WITH PROPOFOL N/A 12/07/2015   Procedure: ESOPHAGOGASTRODUODENOSCOPY (EGD) WITH PROPOFOL;  Surgeon: Garlan Fair, MD;  [facility];  Service: Endoscopy;  Laterality: N/A;  .  ESOPHAGOGASTRODUODENOSCOPY ENDOSCOPY  yrs ago  . FACIAL COSMETIC SURGERY    . GYNECOLOGIC CRYOSURGERY    . MYOMECTOMY    . TONSILLECTOMY     Social History   Socioeconomic History  . Marital status: Single    Spouse name: Not on file  . Number of children: Not on file  . Years of education: Not on file  . Highest education level: Not on file  Occupational History  .  Not on file  Social Needs  . Financial resource strain: Not on file  . Food insecurity:    Worry: Not on file    Inability: Not on file  . Transportation needs:    Medical: Not on file    Non-medical: Not on file  Tobacco Use  . Smoking status: Former Smoker    Packs/day: 0.10    Years: 6.00    Pack years: 0.60    Types: Cigarettes    Last attempt to quit: 08/01/1978    Years since quitting: 39.3  . Smokeless tobacco: Never Used  . Tobacco comment: only smoked 4-6 yrs 1 pack per month  Substance and Sexual Activity  . Alcohol use: Yes    Alcohol/week: 0.0 oz    Comment: very rare  . Drug use: No  . Sexual activity: Not Currently    Birth control/protection: Surgical    Comment: HYSTERECTOMY  Lifestyle  . Physical activity:    Days per week: Not on file    Minutes per session: Not on file  . Stress: Not on file  Relationships  . Social connections:    Talks on phone: Not on file    Gets together: Not on file    Attends religious service: Not on file    Active member of club or organization: Not on file    Attends meetings of clubs or organizations: Not on file    Relationship status: Not on file  . Intimate partner violence:    Fear of current or ex partner: Not on file    Emotionally abused: Not on file    Physically abused: Not on file    Forced sexual activity: Not on file  Other Topics Concern  . Not on file  Social History Narrative  . Not on file     Review of Systems: General: negative for chills, fever, night sweats or weight changes.  Cardiovascular: negative for chest pain,  dyspnea on exertion, edema, orthopnea, palpitations, paroxysmal nocturnal dyspnea or shortness of breath Dermatological: negative for rash Respiratory: negative for cough or wheezing Urologic: negative for hematuria Abdominal: negative for nausea, vomiting, diarrhea, bright red blood per rectum, melena, or hematemesis Neurologic: negative for visual changes, syncope, or dizziness All other systems reviewed and are otherwise negative except as noted above.   Physical Exam:  Blood pressure 140/72, pulse (!) 51, height 5\' 2"  (1.575 m), weight 133 lb 12 oz (60.7 kg).  General appearance: alert, cooperative and no distress Neck: no carotid bruit and no JVD Lungs: clear to auscultation bilaterally Heart: regular rate and rhythm, S1, S2 normal, no murmur, click, rub or gallop Extremities: extremities normal, atraumatic, no cyanosis or edema Pulses: 2+ and symmetric Skin: Skin color, texture, turgor normal. No rashes or lesions Neurologic: Grossly normal  EKG sinus bradycardia 51 bpm otherwise normal EKG-- personally reviewed   ASSESSMENT AND PLAN:   1.  Exertional fatigue and dyspnea: Patient reports work-up at PCP office was unremarkable including normal hemoglobin.  There was concerns regarding abnormal EKG.  Her EKG today shows sinus bradycardia with heart rate of 51 bpm but otherwise normal EKG.  No ST abnormalities are noted.  Her physical exam is benign.  She denies exertional chest pain.  I have reviewed EKG with Dr. Marlou Porch and discussed patient case with him.  We have elected to obtain an echocardiogram as well as an exercise Myoview to assess left ventricular EF, wall motion, valvular anatomy as well as to rule out underlying coronary  ischemia.  We will have patient follow-up after studies are complete.  Emma Lopez, MHS [facility] 12/04/2017 11:15 AM

## 2017-12-05 ENCOUNTER — Telehealth (HOSPITAL_COMMUNITY): Payer: Self-pay | Admitting: *Deleted

## 2017-12-05 NOTE — Telephone Encounter (Signed)
Patient given detailed instructions per Myocardial Perfusion Study Information Sheet for the test on 12/08/17 at 0715. Patient notified to arrive 15 minutes early and that it is imperative to arrive on time for appointment to keep from having the test rescheduled.  If you need to cancel or reschedule your appointment, please call the office within 24 hours of your appointment. . Patient verbalized understanding.Hasspacher, Ranae Palms

## 2017-12-06 DIAGNOSIS — Z7901 Long term (current) use of anticoagulants: Secondary | ICD-10-CM | POA: Diagnosis not present

## 2017-12-08 ENCOUNTER — Ambulatory Visit (HOSPITAL_BASED_OUTPATIENT_CLINIC_OR_DEPARTMENT_OTHER): Payer: PPO

## 2017-12-08 ENCOUNTER — Other Ambulatory Visit: Payer: Self-pay

## 2017-12-08 ENCOUNTER — Ambulatory Visit (HOSPITAL_COMMUNITY): Payer: PPO | Attending: Cardiology

## 2017-12-08 DIAGNOSIS — R0609 Other forms of dyspnea: Secondary | ICD-10-CM

## 2017-12-08 DIAGNOSIS — R001 Bradycardia, unspecified: Secondary | ICD-10-CM | POA: Diagnosis not present

## 2017-12-08 DIAGNOSIS — R9431 Abnormal electrocardiogram [ECG] [EKG]: Secondary | ICD-10-CM

## 2017-12-08 DIAGNOSIS — Z86718 Personal history of other venous thrombosis and embolism: Secondary | ICD-10-CM | POA: Diagnosis not present

## 2017-12-08 DIAGNOSIS — I272 Pulmonary hypertension, unspecified: Secondary | ICD-10-CM | POA: Diagnosis not present

## 2017-12-08 DIAGNOSIS — I447 Left bundle-branch block, unspecified: Secondary | ICD-10-CM | POA: Insufficient documentation

## 2017-12-08 DIAGNOSIS — R0600 Dyspnea, unspecified: Secondary | ICD-10-CM

## 2017-12-08 DIAGNOSIS — Z87891 Personal history of nicotine dependence: Secondary | ICD-10-CM | POA: Diagnosis not present

## 2017-12-08 LAB — MYOCARDIAL PERFUSION IMAGING
LV dias vol: 59 mL (ref 46–106)
LV sys vol: 16 mL
Peak HR: 82 {beats}/min
RATE: 0.33
Rest HR: 49 {beats}/min
SDS: 3
SRS: 11
SSS: 14
TID: 1.05

## 2017-12-08 LAB — ECHOCARDIOGRAM COMPLETE
Height: 62 in
Weight: 2128 oz

## 2017-12-08 MED ORDER — TECHNETIUM TC 99M TETROFOSMIN IV KIT
10.8000 | PACK | Freq: Once | INTRAVENOUS | Status: AC | PRN
  Administered 2017-12-08: 10.8 via INTRAVENOUS
  Filled 2017-12-08: qty 11

## 2017-12-08 MED ORDER — REGADENOSON 0.4 MG/5ML IV SOLN
0.4000 mg | Freq: Once | INTRAVENOUS | Status: AC
Start: 2017-12-08 — End: 2017-12-08
  Administered 2017-12-08: 0.4 mg via INTRAVENOUS

## 2017-12-08 MED ORDER — TECHNETIUM TC 99M TETROFOSMIN IV KIT
32.7000 | PACK | Freq: Once | INTRAVENOUS | Status: AC | PRN
  Administered 2017-12-08: 32.7 via INTRAVENOUS
  Filled 2017-12-08: qty 33

## 2017-12-11 NOTE — Progress Notes (Signed)
Pt has been made aware of normal result and verbalized understanding.  jw 12/11/17

## 2017-12-14 ENCOUNTER — Encounter (INDEPENDENT_AMBULATORY_CARE_PROVIDER_SITE_OTHER): Payer: Self-pay

## 2017-12-14 ENCOUNTER — Ambulatory Visit: Payer: PPO | Admitting: Cardiology

## 2017-12-14 ENCOUNTER — Encounter: Payer: Self-pay | Admitting: Cardiology

## 2017-12-14 VITALS — BP 142/64 | HR 54 | Ht 62.0 in | Wt 138.0 lb

## 2017-12-14 DIAGNOSIS — R001 Bradycardia, unspecified: Secondary | ICD-10-CM

## 2017-12-14 DIAGNOSIS — R0600 Dyspnea, unspecified: Secondary | ICD-10-CM

## 2017-12-14 DIAGNOSIS — I454 Nonspecific intraventricular block: Secondary | ICD-10-CM | POA: Diagnosis not present

## 2017-12-14 DIAGNOSIS — R0609 Other forms of dyspnea: Secondary | ICD-10-CM

## 2017-12-14 HISTORY — DX: Bradycardia, unspecified: R00.1

## 2017-12-14 MED ORDER — METOPROLOL TARTRATE 50 MG PO TABS: ORAL_TABLET | ORAL | 0 refills | Status: DC

## 2017-12-14 NOTE — Patient Instructions (Addendum)
Your physician recommends that you continue on your current medications as directed. Please refer to the Current Medication list given to you today. Your physician has recommended that you wear an event monitor. Event monitors are medical devices that record the heart's electrical activity. Doctors most often [place] these monitors to diagnose arrhythmias. Arrhythmias are problems with the speed or rhythm of the heartbeat. The monitor is a small, portable device. You can wear one while you do your normal daily activities. This is usually used to diagnose what is causing palpitations/syncope (passing out).  Your physician has requested that you have cardiac CT. Cardiac computed tomography (CT) is a painless test that uses an x-ray machine to take clear, detailed pictures of your heart. For further information please visit [email or web address]. Please follow instruction sheet as given.  Your physician recommends that you schedule a follow-up appointment as needed.    Please arrive at the [facility] main entrance of [facility] at xx:xx AM (30-45 minutes prior to test start time)  [facility] [place], [place] [address] [phone]  Proceed to the [facility] Radiology Department (First Floor).  Please follow these instructions carefully (unless otherwise directed):   On the Night Before the Test: . Drink plenty of water. . Do not consume any caffeinated/decaffeinated beverages or chocolate 12 hours prior to your test. . Do not take any antihistamines 12 hours prior to your test. If you take Metformin do not take 24 hours prior to test.  On the Day of the Test: . Drink plenty of water. Do not drink any water within one hour of the test. . Do not eat any food 4 hours prior to the test. . You may take your regular medications prior to the test. . IF NOT ON A BETA BLOCKER - Take 50 mg of lopressor (metoprolol) one hour before the test. . HOLD Furosemide  morning of the test.  After the Test: . Drink plenty of water. . After receiving IV contrast, you may experience a mild flushed feeling. This is normal. . On occasion, you may experience a mild rash up to 24 hours after the test. This is not dangerous. If this occurs, you can take Benadryl 25 mg and increase your fluid intake. . If you experience trouble breathing, this can be serious. If it is severe call 911 IMMEDIATELY. If it is mild, please call our office. . If you take any of these medications: Glipizide/Metformin, Avandament, Glucavance, please do not take 48 hours after completing test.   ADDENDUM 12/14/17:  PT WAS INSTRUCTED THAT SHE DOES NOT NEED TO TAKE METOPROLOL PRIOR TO CORONARY CT.  CALLED AND CANCELLED PRESCRIPTION AT [facility].

## 2017-12-14 NOTE — Progress Notes (Signed)
Cardiology Office Note:    Date:  12/14/2017   ID:  SHAMIA UPPAL, DOB 1945-11-19, MRN [id number]  PCP:  Leighton Ruff, MD  Cardiologist:  No primary care provider on file.    Referring MD: Leighton Ruff, MD   Chief Complaint  Patient presents with  . Follow-up    DOE and fatigue    History of Present Illness:    Emma Lopez is a 72 y.o. female with a hx of DVT with PE, hyperlipidemia and GERD who was seen a few weeks ago by my extender for evaluation of abnormal EKG, dyspnea on exertion and fatigue.  EKG at that time showed sinus bradycardia at 51 bpm but otherwise normal.  2D echocardiogram shows normal LV function with no valvular abnormalities and grade 1 diastolic dysfunction..  Nuclear stress test showed no ischemia.  She is now here today for follow-up.  She says she is feeling a little bit better but continues to have problems with feeling very fatigued as well as having shortness of breath.  She is also had this intermittent discomfort in her chest on the left side which she is not sure if it is her fibromyalgia or not.  She says she has been under a lot of stress taking care of her sister who passed away in 10-02-22 feel less like she is worn out.  She does have a lot of anxiety as well.  She is very concerned because she said she was told she has a left bundle branch block on EKG although all the EKGs I have reviewed including the stress test EKG did not show left bundle branch block.  Past Medical History:  Diagnosis Date  . Abnormal EKG   . Anxiety   . Arthritis    oa  . Bradycardia 12/14/2017  . CIN III (cervical intraepithelial neoplasia III) 2000  . Complication of anesthesia    did well last 2 times with procedures  . Depression   . 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 DVT (deep venous thrombosis) ([place]) 01/2009   LEFT LEG  . Dyspnea on exertion 10/2017  . Elevated triglycerides with high cholesterol   . Endometriosis   . Family history of breast cancer 04/17/2017    . Family history of colon cancer 04/17/2017  . Family history of melanoma 04/17/2017  . Family history of ovarian cancer 04/17/2017  . Family history of pancreatic cancer 04/17/2017  . Fibromyalgia   . GERD (gastroesophageal reflux disease)   . History of colon polyps   . History of hiatal hernia    told by some md has, some say not  . IBS (irritable bowel syndrome) 2015  . Multinodular goiter   . Osteoporosis 03/2015   T score -3.1 stable from prior study  . PE (pulmonary embolism)   . PONV (postoperative nausea and vomiting)   . Restless legs syndrome   . Sleep disorder   . Thyroid disease    HYPERTHYROIDISM  . Vitamin D deficiency     Past Surgical History:  Procedure Laterality Date  . ABDOMINAL HYSTERECTOMY  2001   TAH, partial  . APPENDECTOMY  1978  . COLONOSCOPY WITH PROPOFOL N/A 12/07/2015   Procedure: COLONOSCOPY WITH PROPOFOL;  Surgeon: Garlan Fair, MD;  Location: [facility] ENDOSCOPY;  Service: Endoscopy;  Laterality: N/A;  . colonscopy  7 yrs ago   other in past  . ESOPHAGOGASTRODUODENOSCOPY (EGD) WITH PROPOFOL N/A 12/07/2015   Procedure: ESOPHAGOGASTRODUODENOSCOPY (EGD) WITH PROPOFOL;  Surgeon: Ursula Alert  Wynetta Emery, MD;  Location: [facility];  Service: Endoscopy;  Laterality: N/A;  . ESOPHAGOGASTRODUODENOSCOPY ENDOSCOPY  yrs ago  . FACIAL COSMETIC SURGERY    . GYNECOLOGIC CRYOSURGERY    . MYOMECTOMY    . TONSILLECTOMY      Current Medications: Current Meds  Medication Sig  . acetaminophen (TYLENOL) 325 MG tablet Take 650 mg by mouth every 6 (six) hours as needed for moderate pain.  Marland Kitchen ALPRAZolam (XANAX) 0.25 MG tablet Take 1 tablet (0.25 mg total) by mouth every 8 (eight) hours as needed for anxiety.  . B Complex Vitamins (B COMPLEX 1 PO) Take 1 each by mouth 2 (two) times daily. sublingual  . denosumab (PROLIA) 60 MG/ML SOLN injection Inject 60 mg into the skin every 6 (six) months. Administer in upper arm, thigh, or abdomen  . escitalopram (LEXAPRO) 20 MG tablet Take  20 mg by mouth daily.    . fish oil-omega-3 fatty acids 1000 MG capsule Take 2 g by mouth daily.    . fluticasone (FLONASE) 50 MCG/ACT nasal spray USE 2 SPRAYS INTO EACH NOSTRIL AS NEEDED ALLERGIES.  . Multiple Vitamins-Minerals (HAIR SKIN AND NAILS FORMULA PO) Take 2 tablets by mouth daily.  . Multiple Vitamins-Minerals (MULTIVITAMIN PO) Take 1 tablet by mouth 2 (two) times daily.   Marland Kitchen rOPINIRole (REQUIP) 1 MG tablet Take 1 mg by mouth at bedtime.   Marland Kitchen warfarin (COUMADIN) 1 MG tablet Take 0.5-1 mg by mouth every evening. Takes half a tablet on Tuesday, Thursday, Saturday, Sunday and one tablet on Monday, Wednesday, Friday.     Allergies:   Sulfa antibiotics and Warfarin and related   Social History   Socioeconomic History  . Marital status: Single    Spouse name: Not on file  . Number of children: Not on file  . Years of education: Not on file  . Highest education level: Not on file  Occupational History  . Not on file  Social Needs  . Financial resource strain: Not on file  . Food insecurity:    Worry: Not on file    Inability: Not on file  . Transportation needs:    Medical: Not on file    Non-medical: Not on file  Tobacco Use  . Smoking status: Former Smoker    Packs/day: 0.10    Years: 6.00    Pack years: 0.60    Types: Cigarettes    Last attempt to quit: 08/01/1978    Years since quitting: 39.3  . Smokeless tobacco: Never Used  . Tobacco comment: only smoked 4-6 yrs 1 pack per month  Substance and Sexual Activity  . Alcohol use: Yes    Alcohol/week: 0.0 oz    Comment: very rare  . Drug use: No  . Sexual activity: Not Currently    Birth control/protection: Surgical    Comment: HYSTERECTOMY  Lifestyle  . Physical activity:    Days per week: Not on file    Minutes per session: Not on file  . Stress: Not on file  Relationships  . Social connections:    Talks on phone: Not on file    Gets together: Not on file    Attends religious service: Not on file    Active  member of club or organization: Not on file    Attends meetings of clubs or organizations: Not on file    Relationship status: Not on file  Other Topics Concern  . Not on file  Social History Narrative  . Not on file  Family History: The patient's family history includes Alzheimer's disease in her maternal aunt and maternal aunt; Breast cancer in her maternal aunt, maternal aunt, and mother; Cancer in her father, maternal aunt, maternal aunt, and paternal aunt; Hypertension in her mother and sister; Stroke in her sister.  ROS:   Please see the history of present illness.    ROS  All other systems reviewed and negative.   EKGs/Labs/Other Studies Reviewed:    The following studies were reviewed today: 2D echo and nuclear stress test  EKG:  EKG is not ordered today.   Recent Labs: 11/16/2017: BUN 18; Creatinine, Ser 0.71; Hemoglobin 13.3; Platelets 230; Potassium 3.7; Sodium 136   Recent Lipid Panel No results found for: CHOL, TRIG, HDL, CHOLHDL, VLDL, LDLCALC, LDLDIRECT  Physical Exam:    VS:  BP (!) 142/64   Pulse (!) 54   Ht 5\' 2"  (1.575 m)   Wt 138 lb (62.6 kg)   SpO2 97%   BMI 25.24 kg/m     Wt Readings from Last 3 Encounters:  12/14/17 138 lb (62.6 kg)  12/08/17 133 lb (60.3 kg)  12/04/17 133 lb 12 oz (60.7 kg)     GEN:  Well nourished, well developed in no acute distress HEENT: Normal NECK: No JVD; No carotid bruits LYMPHATICS: No lymphadenopathy CARDIAC: RRR, no murmurs, rubs, gallops RESPIRATORY:  Clear to auscultation without rales, wheezing or rhonchi  ABDOMEN: Soft, non-tender, non-distended MUSCULOSKELETAL:  No edema; No deformity  SKIN: Warm and dry NEUROLOGIC:  Alert and oriented x 3 PSYCHIATRIC:  Normal affect   ASSESSMENT:    1. Dyspnea on exertion   2. Bradycardia    PLAN:    In order of problems listed above:  1.  Dyspnea on exertion -unclear etiology is 2D echocardiogram showed normal LV size and systolic function with no valvular  abnormalities.  She does have grade 1 diastolic dysfunction.  Nuclear stress test showed no ischemia.  She is bradycardic on exam with heart rate 54 that this could be the etiology of her fatigue and dyspnea on exertion.  I will get an event monitor to make sure she is not having any significant bradycardia arrhythmias.  She also continues to complain of dyspnea on exertion and therefore I am going to get a coronary CTA with FFR to rule out CAD.  If this is normal then I will refer her to pulmonary for further evaluation of her dyspnea.  She does have a history of reflux and clears her throat a lot during the office visit so her shortness of breath may be related to GERD.  2.  Bradycardia -heart rate is in the 50s today and she is not on any rate slowing medications.  I will get an event monitor to see what her average heart rate is.    3.  Abnormal EKG with reported left bundle branch block -reviewed all the EKGs and I cannot find an EKG showing a left bundle branch block.  Currently this was reported at the PCP office.   Medication Adjustments/Labs and Tests Ordered: Current medicines are reviewed at length with the patient today.  Concerns regarding medicines are outlined above.  No orders of the defined types were placed in this encounter.  No orders of the defined types were placed in this encounter.   Signed, Fransico Him, MD  12/14/2017 8:50 AM    [place]

## 2017-12-18 ENCOUNTER — Telehealth: Payer: Self-pay | Admitting: Cardiology

## 2017-12-18 DIAGNOSIS — Z79899 Other long term (current) drug therapy: Secondary | ICD-10-CM

## 2017-12-18 NOTE — Telephone Encounter (Signed)
New message    Patient needs order for bmet put in  Scheduled for cardiac ct 01/10/18 130pm - needs insurance

## 2017-12-18 NOTE — Telephone Encounter (Signed)
Order for BMET placed.  

## 2018-01-03 ENCOUNTER — Other Ambulatory Visit: Payer: PPO | Admitting: *Deleted

## 2018-01-03 DIAGNOSIS — Z79899 Other long term (current) drug therapy: Secondary | ICD-10-CM | POA: Diagnosis not present

## 2018-01-04 LAB — BASIC METABOLIC PANEL
BUN/Creatinine Ratio: 29 — ABNORMAL HIGH (ref 12–28)
BUN: 20 mg/dL (ref 8–27)
CO2: 26 mmol/L (ref 20–29)
Calcium: 8.6 mg/dL — ABNORMAL LOW (ref 8.7–10.3)
Chloride: 103 mmol/L (ref 96–106)
Creatinine, Ser: 0.68 mg/dL (ref 0.57–1.00)
GFR calc Af Amer: 103 mL/min/{1.73_m2} (ref >59)
GFR calc non Af Amer: 90 mL/min/{1.73_m2} (ref >59)
Glucose: 97 mg/dL (ref 65–99)
Potassium: 4.3 mmol/L (ref 3.5–5.2)
Sodium: 142 mmol/L (ref 134–144)

## 2018-01-08 ENCOUNTER — Encounter: Payer: Self-pay | Admitting: Gynecology

## 2018-01-08 ENCOUNTER — Telehealth: Payer: Self-pay | Admitting: *Deleted

## 2018-01-08 DIAGNOSIS — Z803 Family history of malignant neoplasm of breast: Secondary | ICD-10-CM | POA: Diagnosis not present

## 2018-01-08 DIAGNOSIS — M810 Age-related osteoporosis without current pathological fracture: Secondary | ICD-10-CM | POA: Diagnosis not present

## 2018-01-08 DIAGNOSIS — M8588 Other specified disorders of bone density and structure, other site: Secondary | ICD-10-CM | POA: Diagnosis not present

## 2018-01-08 DIAGNOSIS — Z1231 Encounter for screening mammogram for malignant neoplasm of breast: Secondary | ICD-10-CM | POA: Diagnosis not present

## 2018-01-08 NOTE — Telephone Encounter (Signed)
Patient has dexa scheduled today at 1:45pm at [facility] needs order, faxed. This was done. Pt aware.

## 2018-01-09 ENCOUNTER — Encounter: Payer: Self-pay | Admitting: Gynecology

## 2018-01-09 DIAGNOSIS — Z7901 Long term (current) use of anticoagulants: Secondary | ICD-10-CM | POA: Diagnosis not present

## 2018-01-10 ENCOUNTER — Ambulatory Visit (HOSPITAL_COMMUNITY)
Admission: RE | Admit: 2018-01-10 | Discharge: 2018-01-10 | Disposition: A | Discharge status: Home / Self Care | Payer: PPO | Source: Ambulatory Visit | Attending: Cardiology | Admitting: Cardiology

## 2018-01-10 ENCOUNTER — Encounter: Payer: Self-pay | Admitting: Gynecology

## 2018-01-10 ENCOUNTER — Ambulatory Visit (HOSPITAL_COMMUNITY): Payer: PPO

## 2018-01-10 DIAGNOSIS — R0600 Dyspnea, unspecified: Secondary | ICD-10-CM

## 2018-01-10 DIAGNOSIS — I447 Left bundle-branch block, unspecified: Secondary | ICD-10-CM | POA: Diagnosis not present

## 2018-01-10 DIAGNOSIS — R0609 Other forms of dyspnea: Secondary | ICD-10-CM | POA: Diagnosis not present

## 2018-01-10 DIAGNOSIS — R079 Chest pain, unspecified: Secondary | ICD-10-CM | POA: Diagnosis not present

## 2018-01-10 MED ORDER — IOPAMIDOL (ISOVUE-370) INJECTION 76%
INTRAVENOUS | Status: AC
  Filled 2018-01-10: qty 100

## 2018-01-10 MED ORDER — NITROGLYCERIN 0.4 MG SL SUBL
0.8000 mg | SUBLINGUAL_TABLET | Freq: Once | SUBLINGUAL | Status: AC
  Administered 2018-01-10: 0.8 mg via SUBLINGUAL

## 2018-01-10 MED ORDER — NITROGLYCERIN 0.4 MG SL SUBL
SUBLINGUAL_TABLET | SUBLINGUAL | Status: AC
  Filled 2018-01-10: qty 2

## 2018-01-10 MED ORDER — IOPAMIDOL (ISOVUE-370) INJECTION 76%
100.0000 mL | Freq: Once | INTRAVENOUS | Status: AC | PRN
  Administered 2018-01-10: 100 mL via INTRAVENOUS

## 2018-01-11 ENCOUNTER — Ambulatory Visit (INDEPENDENT_AMBULATORY_CARE_PROVIDER_SITE_OTHER): Payer: PPO

## 2018-01-11 ENCOUNTER — Telehealth: Payer: Self-pay | Admitting: *Deleted

## 2018-01-11 DIAGNOSIS — R001 Bradycardia, unspecified: Secondary | ICD-10-CM | POA: Diagnosis not present

## 2018-01-11 NOTE — Telephone Encounter (Signed)
Pt is ready to proceed with Prolia. Her recent labs showed a abnormal calcium score of 8.6 will route this to [employer] Alas for advice

## 2018-01-14 NOTE — Telephone Encounter (Signed)
Please call and have her take a calcium supp twice daily for 2-3 weeks and recheck a calcium.  Make sure she does not get constipated with calcium supp, some people do. Tell her it was minimally low. thanks

## 2018-01-15 NOTE — Telephone Encounter (Signed)
Pt aware of instructions. Pt does not want to scheduled labs at this moment would like [place] to call her back in 2 to 3 weeks. I will call pt back the week of July 1st

## 2018-01-18 DIAGNOSIS — R001 Bradycardia, unspecified: Secondary | ICD-10-CM | POA: Diagnosis not present

## 2018-01-24 DIAGNOSIS — E042 Nontoxic multinodular goiter: Secondary | ICD-10-CM | POA: Diagnosis not present

## 2018-01-24 DIAGNOSIS — E559 Vitamin D deficiency, unspecified: Secondary | ICD-10-CM | POA: Diagnosis not present

## 2018-01-25 DIAGNOSIS — M810 Age-related osteoporosis without current pathological fracture: Secondary | ICD-10-CM | POA: Diagnosis not present

## 2018-01-25 DIAGNOSIS — E559 Vitamin D deficiency, unspecified: Secondary | ICD-10-CM | POA: Diagnosis not present

## 2018-01-25 DIAGNOSIS — E042 Nontoxic multinodular goiter: Secondary | ICD-10-CM | POA: Diagnosis not present

## 2018-02-05 ENCOUNTER — Other Ambulatory Visit: Payer: PPO

## 2018-02-05 DIAGNOSIS — M810 Age-related osteoporosis without current pathological fracture: Secondary | ICD-10-CM | POA: Diagnosis not present

## 2018-02-05 LAB — CALCIUM: Calcium: 8.5 mg/dL — ABNORMAL LOW (ref 8.6–10.4)

## 2018-02-05 NOTE — Progress Notes (Unsigned)
calc

## 2018-02-06 DIAGNOSIS — Z7901 Long term (current) use of anticoagulants: Secondary | ICD-10-CM | POA: Diagnosis not present

## 2018-02-07 ENCOUNTER — Other Ambulatory Visit: Payer: Self-pay | Admitting: Women's Health

## 2018-02-07 DIAGNOSIS — E58 Dietary calcium deficiency: Secondary | ICD-10-CM

## 2018-02-08 NOTE — Telephone Encounter (Signed)
Notes recorded by Ramond Craver, RMA on 02/07/2018 at 12:19 PM EDT Spoke with patient and informed her. Order placed. ------  Notes recorded by Huel Cote, NP on 02/07/2018 at 7:17 AM EDT Please call and review calcium level is still a little low, increase calcium rich foods in diet, add to calcium supplements twice daily, and recheck in 1 month. Have her take a vitamin D supplement along with the calcium which will help with the absorption of it.

## 2018-02-20 ENCOUNTER — Telehealth: Payer: Self-pay | Admitting: Cardiology

## 2018-02-20 DIAGNOSIS — R0602 Shortness of breath: Secondary | ICD-10-CM

## 2018-02-20 NOTE — Telephone Encounter (Signed)
Patient made aware of results and recommendations to refer to pulmonary for SOB. Patient verbalizes understanding and thanked me for the call. Referral placed to pulmonology.    Notes recorded by Sueanne Margarita, MD on 02/18/2018 at 12:57 PM EDT Please refer to Pulmonary to evaluate SOB ------  Notes recorded by Sueanne Margarita, MD on 02/18/2018 at 12:55 PM EDT Heart monitor showed occasional PACs and PVCs as well as short runs of PACx which are benign. Average heart rate 50-80bpm.

## 2018-02-20 NOTE — Telephone Encounter (Signed)
Pt stated she is returning a call to nurse. Please call pt.

## 2018-02-27 DIAGNOSIS — Z7901 Long term (current) use of anticoagulants: Secondary | ICD-10-CM | POA: Diagnosis not present

## 2018-03-08 NOTE — Telephone Encounter (Signed)
Spoke with patient she states she will call me back to schedule lab appt. I offered a courtesy call as a reminder she declined. Will close encounter and wait on her call back  Lab order in system

## 2018-04-03 DIAGNOSIS — Z7901 Long term (current) use of anticoagulants: Secondary | ICD-10-CM | POA: Diagnosis not present

## 2018-04-24 ENCOUNTER — Telehealth: Payer: Self-pay | Admitting: Women's Health

## 2018-04-24 NOTE — Telephone Encounter (Signed)
-----   Message from Alen Blew, [place] sent at 04/23/2018  9:43 AM EDT ----- Regarding: pt question Evette Doffing.  Your long time patient called about wanting an appointment with both you and Dr Phineas Real regarding having her ovaries removed.  We have never done that before.  I know she trusts your opinion whole heartedly. I didn't know if you wanted to call her and advise her to make an apt with TF.  Just let me know if you want me to call her.  Her #s are [phone] or [phone]   Thanks Sharrie Rothman

## 2018-04-24 NOTE — Telephone Encounter (Signed)
Telephone call to discuss BSO.  Sister Dub Mikes died from ovarian cancer both she and Maybel are negative BRCA.  Would like to discuss possible BSO with Dr. Phineas Real will schedule appointment.  History of DVT is currently on Coumadin, reviewed would need to be on Lovenox prior.  Will schedule annual exam and discussion with Dr. Phineas Real.  Currently on Prolia for osteoporosis was not able to have due to slightly low calcium, will have testing today and if a little bit low will sign a waiver because would like to proceed.

## 2018-05-01 DIAGNOSIS — R194 Change in bowel habit: Secondary | ICD-10-CM | POA: Diagnosis not present

## 2018-05-01 DIAGNOSIS — F339 Major depressive disorder, recurrent, unspecified: Secondary | ICD-10-CM | POA: Diagnosis not present

## 2018-05-01 DIAGNOSIS — Z7901 Long term (current) use of anticoagulants: Secondary | ICD-10-CM | POA: Diagnosis not present

## 2018-05-01 DIAGNOSIS — Z23 Encounter for immunization: Secondary | ICD-10-CM | POA: Diagnosis not present

## 2018-05-01 DIAGNOSIS — F419 Anxiety disorder, unspecified: Secondary | ICD-10-CM | POA: Diagnosis not present

## 2018-05-02 ENCOUNTER — Institutional Professional Consult (permissible substitution): Payer: PPO | Admitting: Pulmonary Disease

## 2018-05-03 ENCOUNTER — Other Ambulatory Visit: Payer: PPO

## 2018-05-03 DIAGNOSIS — E58 Dietary calcium deficiency: Secondary | ICD-10-CM | POA: Diagnosis not present

## 2018-05-04 LAB — CALCIUM: Calcium: 8.7 mg/dL (ref 8.6–10.4)

## 2018-05-09 ENCOUNTER — Institutional Professional Consult (permissible substitution): Payer: PPO | Admitting: Pulmonary Disease

## 2018-05-10 ENCOUNTER — Other Ambulatory Visit (INDEPENDENT_AMBULATORY_CARE_PROVIDER_SITE_OTHER): Payer: PPO

## 2018-05-10 ENCOUNTER — Encounter: Payer: Self-pay | Admitting: Pulmonary Disease

## 2018-05-10 ENCOUNTER — Ambulatory Visit: Payer: PPO | Admitting: Pulmonary Disease

## 2018-05-10 VITALS — BP 138/76 | HR 58 | Ht 62.0 in | Wt 135.0 lb

## 2018-05-10 DIAGNOSIS — R0600 Dyspnea, unspecified: Secondary | ICD-10-CM

## 2018-05-10 DIAGNOSIS — R0609 Other forms of dyspnea: Secondary | ICD-10-CM

## 2018-05-10 LAB — CBC WITH DIFFERENTIAL/PLATELET
Basophils Absolute: 0.1 10*3/uL (ref 0.0–0.1)
Basophils Relative: 1.1 % (ref 0.0–3.0)
Eosinophils Absolute: 0.3 10*3/uL (ref 0.0–0.7)
Eosinophils Relative: 4.9 % (ref 0.0–5.0)
HCT: 42.6 % (ref 36.0–46.0)
Hemoglobin: 14.1 g/dL (ref 12.0–15.0)
Lymphocytes Relative: 32.1 % (ref 12.0–46.0)
Lymphs Abs: 2.1 10*3/uL (ref 0.7–4.0)
MCHC: 33.1 g/dL (ref 30.0–36.0)
MCV: 92 fl (ref 78.0–100.0)
Monocytes Absolute: 0.7 10*3/uL (ref 0.1–1.0)
Monocytes Relative: 10.8 % (ref 3.0–12.0)
Neutro Abs: 3.4 10*3/uL (ref 1.4–7.7)
Neutrophils Relative %: 51.1 % (ref 43.0–77.0)
Platelets: 230 10*3/uL (ref 150.0–400.0)
RBC: 4.62 Mil/uL (ref 3.87–5.11)
RDW: 14 % (ref 11.5–15.5)
WBC: 6.6 10*3/uL (ref 4.0–10.5)

## 2018-05-10 LAB — POCT EXHALED NITRIC OXIDE: FeNO level (ppb): 8

## 2018-05-10 NOTE — Progress Notes (Signed)
Emma Lopez    [id number]    [birth year]  Primary Care Physician:Barnes, Benjamine Mola, MD  Referring Physician: Leighton Ruff, MD [place], [place] [address]  Chief complaint:    HPI: 72 year old with history of DVT, PE, hyperlipidemia, GERD, sinus bradycardia, anxiety, fibromyalgia.  Referred for evaluation of dyspnea.  Symptoms started around April 2019 after she had increased personal stress at home.  Complains of dyspnea on exertion, fatigue, occasional cough, inability to take full breath.  No mucus production, wheezing. Follows with Dr. Radford Pax, cardiology.  Had a 2D echocardiogram showing grade 1 diastolic dysfunction, no ischemia on stress test and cardiac CT shows no significant coronary artery disease. Noted to have sinus bradycardia.  History noted for recurrent DVT, PE around 2009.  She is on Coumadin anticoagulation.  She also has history of hyperthyroidism with autonomously functioning multinodular goiter.  She follows with Dr. Elyse Hsu, Endocrinology.  Thyroid function tests in June 2019 were normal. She has history of intermittent GERD and uses Tums as needed.  Pets: Cat, no dogs, birds, farm animals Occupation: Retired Pharmacist, hospital, travel agent. Exposures: Notes a musty smell at home.  No mold, dampness, hot tub, Jacuzzi Smoking history: 60-pack-year smoker during college.  Quit in 1980 Travel history: No significant travel Relevant family history: No significant history of lung disease.  Outpatient Encounter Medications as of 05/10/2018  Medication Sig  . acetaminophen (TYLENOL) 325 MG tablet Take 650 mg by mouth every 6 (six) hours as needed for moderate pain.  Marland Kitchen ALPRAZolam (XANAX) 0.25 MG tablet Take 1 tablet (0.25 mg total) by mouth every 8 (eight) hours as needed for anxiety.  . B Complex Vitamins (B COMPLEX 1 PO) Take 1 each by mouth 2 (two) times daily. sublingual  . denosumab (PROLIA) 60 MG/ML SOLN injection Inject 60 mg into the skin  every 6 (six) months. Administer in upper arm, thigh, or abdomen  . fish oil-omega-3 fatty acids 1000 MG capsule Take 2 g by mouth daily.    . fluticasone (FLONASE) 50 MCG/ACT nasal spray USE 2 SPRAYS INTO EACH NOSTRIL AS NEEDED ALLERGIES.  . Multiple Vitamins-Minerals (HAIR SKIN AND NAILS FORMULA PO) Take 2 tablets by mouth daily.  . Multiple Vitamins-Minerals (MULTIVITAMIN PO) Take 1 tablet by mouth 2 (two) times daily.   Marland Kitchen rOPINIRole (REQUIP) 1 MG tablet Take 1 mg by mouth at bedtime.   Marland Kitchen warfarin (COUMADIN) 1 MG tablet Take 0.5-1 mg by mouth every evening. Takes half a tablet on Tuesday, Thursday, Saturday, Sunday and one tablet on Monday, Wednesday, Friday.  . escitalopram (LEXAPRO) 20 MG tablet Take 20 mg by mouth daily.     No facility-administered encounter medications on file as of 05/10/2018.     Allergies as of 05/10/2018 - Review Complete 05/10/2018  Allergen Reaction Noted  . Sulfa antibiotics Rash 01/04/2011  . Warfarin and related Rash 11/16/2017    Past Medical History:  Diagnosis Date  . Abnormal EKG   . Anxiety   . Arthritis    oa  . Bradycardia 12/14/2017  . CIN III (cervical intraepithelial neoplasia III) 2000  . Complication of anesthesia    did well last 2 times with procedures  . Depression   . 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 DVT (deep venous thrombosis) ([place]) 01/2009   LEFT LEG  . Dyspnea on exertion 10/2017  . Elevated triglycerides with high cholesterol   . Endometriosis   . Family history of breast cancer  04/17/2017  . Family history of colon cancer 04/17/2017  . Family history of melanoma 04/17/2017  . Family history of ovarian cancer 04/17/2017  . Family history of pancreatic cancer 04/17/2017  . Fibromyalgia   . GERD (gastroesophageal reflux disease)   . History of colon polyps   . History of hiatal hernia    told by some md has, some say not  . IBS (irritable bowel syndrome) 2015  . Multinodular goiter   . Osteoporosis 12/2017   T score -3.2 stable  from prior study  . PE (pulmonary embolism)   . PONV (postoperative nausea and vomiting)   . Restless legs syndrome   . Sleep disorder   . Thyroid disease    HYPERTHYROIDISM  . Vitamin D deficiency     Past Surgical History:  Procedure Laterality Date  . ABDOMINAL HYSTERECTOMY  2001   TAH, partial  . APPENDECTOMY  1978  . COLONOSCOPY WITH PROPOFOL N/A 12/07/2015   Procedure: COLONOSCOPY WITH PROPOFOL;  Surgeon: Garlan Fair, MD;  Location: [facility];  Service: Endoscopy;  Laterality: N/A;  . colonscopy  7 yrs ago   other in past  . ESOPHAGOGASTRODUODENOSCOPY (EGD) WITH PROPOFOL N/A 12/07/2015   Procedure: ESOPHAGOGASTRODUODENOSCOPY (EGD) WITH PROPOFOL;  Surgeon: Garlan Fair, MD;  Location: [facility];  Service: Endoscopy;  Laterality: N/A;  . ESOPHAGOGASTRODUODENOSCOPY ENDOSCOPY  yrs ago  . FACIAL COSMETIC SURGERY    . GYNECOLOGIC CRYOSURGERY    . MYOMECTOMY    . TONSILLECTOMY      Family History  Problem Relation Age of Onset  . Hypertension Mother   . Breast cancer Mother        85's  . Cancer Father        COLON  . Hypertension Sister   . Stroke Sister   . Breast cancer Maternal Aunt        Age 16's  . Cancer Maternal Aunt        Melanoma  . Alzheimer's disease Maternal Aunt   . Cancer Paternal Aunt        OVARIAN and COLON  . Breast cancer Maternal Aunt        70's  . Cancer Maternal Aunt        Colon CA  . Alzheimer's disease Maternal Aunt     Social History   Socioeconomic History  . Marital status: Single    Spouse name: Not on file  . Number of children: Not on file  . Years of education: Not on file  . Highest education level: Not on file  Occupational History  . Not on file  Social Needs  . Financial resource strain: Not on file  . Food insecurity:    Worry: Not on file    Inability: Not on file  . Transportation needs:    Medical: Not on file    Non-medical: Not on file  Tobacco Use  . Smoking status: Former Smoker     Packs/day: 0.10    Years: 6.00    Pack years: 0.60    Types: Cigarettes    Last attempt to quit: 08/01/1978    Years since quitting: 39.8  . Smokeless tobacco: Never Used  . Tobacco comment: only smoked 4-6 yrs 1 pack per month  Substance and Sexual Activity  . Alcohol use: Yes    Alcohol/week: 0.0 standard drinks    Comment: very rare  . Drug use: No  . Sexual activity: Not Currently    Birth control/protection: Surgical  Comment: HYSTERECTOMY  Lifestyle  . Physical activity:    Days per week: Not on file    Minutes per session: Not on file  . Stress: Not on file  Relationships  . Social connections:    Talks on phone: Not on file    Gets together: Not on file    Attends religious service: Not on file    Active member of club or organization: Not on file    Attends meetings of clubs or organizations: Not on file    Relationship status: Not on file  . Intimate partner violence:    Fear of current or ex partner: Not on file    Emotionally abused: Not on file    Physically abused: Not on file    Forced sexual activity: Not on file  Other Topics Concern  . Not on file  Social History Narrative  . Not on file   Review of systems: Review of Systems  Constitutional: Negative for fever and chills.  HENT: Negative.   Eyes: Negative for blurred vision.  Respiratory: as per HPI  Cardiovascular: Negative for chest pain and palpitations.  Gastrointestinal: Negative for vomiting, diarrhea, blood per rectum. Genitourinary: Negative for dysuria, urgency, frequency and hematuria.  Musculoskeletal: Negative for myalgias, back pain and joint pain.  Skin: Negative for itching and rash.  Neurological: Negative for dizziness, tremors, focal weakness, seizures and loss of consciousness.  Endo/Heme/Allergies: Negative for environmental allergies.  Psychiatric/Behavioral: Negative for depression, suicidal ideas and hallucinations.  All other systems reviewed and are negative.  Physical  Exam: Blood pressure 138/76, pulse (!) 58, height 5\' 2"  (1.575 m), weight 135 lb (61.2 kg), SpO2 97 %. Gen:      No acute distress HEENT:  EOMI, sclera anicteric Neck:     No masses; no thyromegaly Lungs:    Clear to auscultation bilaterally; normal respiratory effort CV:         Regular rate and rhythm; no murmurs Abd:      + bowel sounds; soft, non-tender; no palpable masses, no distension Ext:    No edema; adequate peripheral perfusion Skin:      Warm and dry; no rash Neuro: alert and oriented x 3 Psych: normal mood and affect  Data Reviewed: Imaging: CT coronary 01/10/2018- mild pleural thickening.  Left base scarring/atelectasis.  No acute lung abnormality CT chest 11/17/2017- minimal bibasilar atelectasis/scarring.  I have reviewed the images personally.  PFTs: Pending  FENO 05/10/2018-8  Labs:  Cardiac Echo 12/08/2017- normal LV size with EF 65%, normal RV size and systolic function.  No significant valvular abnormality.  Borderline pulmonary hypertension.  PA peak pressure 35.  Assessment:  Assessment for dyspnea Unclear etiology.  Suspicion for asthma and COPD is low as she has minimal smoking history and symptoms are not typical.  She does have history of remote DVT/PE on Coumadin anticoagulation.  Echocardiogram shows borderline pulmonary hypertension with no RV dysfunction.  Chest imaging from this year shows minimal basal scarring/atelectasis and no evidence of interstitial lung disease. She may have a component of anxiety or bradycardia may be contributing to symptoms  Evaluate with CBC differential, IgE, PFTs.  She still continues to be symptomatic then consider cardiopulmonary exercise test  Plan/Recommendations: - CBC, IgE, PFTs  Marshell Garfinkel MD  Pulmonary and Critical Care 05/10/2018, 2:02 PM  CC: Leighton Ruff, MD

## 2018-05-10 NOTE — Patient Instructions (Signed)
We will get blood test today including CBC differential, IgE.  We will check a FENO and schedule you for pulmonary function test Return to clinic in 2 to 4 weeks for reevaluation.

## 2018-05-11 LAB — IGE: IgE (Immunoglobulin E), Serum: <2 kU/L (ref <=114)

## 2018-05-15 ENCOUNTER — Telehealth: Payer: Self-pay | Admitting: *Deleted

## 2018-05-15 NOTE — Telephone Encounter (Signed)
Deductible Amount met  OOP MAX  Individual: $3400 [phone] Met)  Annual exam 05/29/18  Calcium  8.7           Date 05/03/18  Upcoming dental procedures NO  Prior Authorization needed   Pt estimated Cost $243   05/22/18 _0 :30    Coverage Details: 20% one dose, 20 % admin fee

## 2018-05-15 NOTE — Telephone Encounter (Signed)
-----   Message from Thamas Jaegers, [place] sent at 05/15/2018  9:44 AM EDT ----- Patient has been informed with the below, I told her you will call her with update regarding Prolia when get a chance.   "Please call and review calcium is normal!!!  Schedule her for the prolia  "

## 2018-05-22 ENCOUNTER — Ambulatory Visit: Payer: PPO

## 2018-05-23 ENCOUNTER — Ambulatory Visit (INDEPENDENT_AMBULATORY_CARE_PROVIDER_SITE_OTHER): Payer: PPO | Admitting: Gynecology

## 2018-05-23 DIAGNOSIS — M810 Age-related osteoporosis without current pathological fracture: Secondary | ICD-10-CM | POA: Diagnosis not present

## 2018-05-23 MED ORDER — DENOSUMAB 60 MG/ML ~~LOC~~ SOSY
60.0000 mg | PREFILLED_SYRINGE | Freq: Once | SUBCUTANEOUS | Status: AC
  Administered 2018-05-23: 60 mg via SUBCUTANEOUS

## 2018-05-24 DIAGNOSIS — Z800 Family history of malignant neoplasm of digestive organs: Secondary | ICD-10-CM | POA: Diagnosis not present

## 2018-05-24 DIAGNOSIS — Z86010 Personal history of colonic polyps: Secondary | ICD-10-CM | POA: Diagnosis not present

## 2018-05-24 DIAGNOSIS — R194 Change in bowel habit: Secondary | ICD-10-CM | POA: Diagnosis not present

## 2018-05-28 DIAGNOSIS — H40013 Open angle with borderline findings, low risk, bilateral: Secondary | ICD-10-CM | POA: Diagnosis not present

## 2018-05-28 NOTE — Telephone Encounter (Signed)
PROLIA GIVEN 05/23/18 NEXT INJECTION 11/23/2018

## 2018-05-29 DIAGNOSIS — Z7901 Long term (current) use of anticoagulants: Secondary | ICD-10-CM | POA: Diagnosis not present

## 2018-05-30 ENCOUNTER — Encounter: Payer: Self-pay | Admitting: Pulmonary Disease

## 2018-05-30 ENCOUNTER — Ambulatory Visit: Payer: PPO | Admitting: Pulmonary Disease

## 2018-05-30 ENCOUNTER — Ambulatory Visit (INDEPENDENT_AMBULATORY_CARE_PROVIDER_SITE_OTHER): Payer: PPO | Admitting: Pulmonary Disease

## 2018-05-30 VITALS — BP 118/70 | HR 59 | Ht 61.0 in | Wt 134.0 lb

## 2018-05-30 DIAGNOSIS — R0609 Other forms of dyspnea: Secondary | ICD-10-CM

## 2018-05-30 DIAGNOSIS — R0600 Dyspnea, unspecified: Secondary | ICD-10-CM

## 2018-05-30 LAB — PULMONARY FUNCTION TEST
DL/VA % pred: 100 %
DL/VA: 4.43 ml/min/mmHg/L
DLCO cor % pred: 84 %
DLCO cor: 17.03 ml/min/mmHg
DLCO unc % pred: 85 %
DLCO unc: 17.39 ml/min/mmHg
FEF 25-75 Post: 1.94 L/sec
FEF 25-75 Pre: 1.89 L/sec
FEF2575-%Change-Post: 2 %
FEF2575-%Pred-Post: 112 %
FEF2575-%Pred-Pre: 108 %
FEV1-%Change-Post: 4 %
FEV1-%Pred-Post: 96 %
FEV1-%Pred-Pre: 92 %
FEV1-Post: 1.92 L
FEV1-Pre: 1.84 L
FEV1FVC-%Change-Post: 6 %
FEV1FVC-%Pred-Pre: 102 %
FEV6-%Change-Post: -1 %
FEV6-%Pred-Post: 91 %
FEV6-%Pred-Pre: 93 %
FEV6-Post: 2.31 L
FEV6-Pre: 2.35 L
FEV6FVC-%Change-Post: 0 %
FEV6FVC-%Pred-Post: 104 %
FEV6FVC-%Pred-Pre: 104 %
FVC-%Change-Post: -1 %
FVC-%Pred-Post: 87 %
FVC-%Pred-Pre: 89 %
FVC-Post: 2.32 L
FVC-Pre: 2.36 L
Post FEV1/FVC ratio: 83 %
Post FEV6/FVC ratio: 100 %
Pre FEV1/FVC ratio: 78 %
Pre FEV6/FVC Ratio: 100 %
RV % pred: 171 %
RV: 3.49 L
TLC % pred: 127 %
TLC: 5.89 L

## 2018-05-30 MED ORDER — BUDESONIDE-FORMOTEROL FUMARATE 160-4.5 MCG/ACT IN AERO: 2.0000 | INHALATION_SPRAY | Freq: Two times a day (BID) | RESPIRATORY_TRACT | 6 refills | Status: DC

## 2018-05-30 NOTE — Progress Notes (Signed)
Emma Lopez    [id number]    04-Apr-1946  Primary Care Physician:Barnes, Benjamine Mola, MD  Referring Physician: Leighton Ruff, [place], [place] [address]  Chief complaint: Consult for dyspnea  HPI: 72 year old with history of DVT, PE, hyperlipidemia, GERD, sinus bradycardia, anxiety, fibromyalgia.  Referred for evaluation of dyspnea.  Symptoms started around April 2019 after she had increased personal stress at home.  Complains of dyspnea on exertion, fatigue, occasional cough, inability to take full breath.  No mucus production, wheezing. Follows with Dr. Radford Pax, cardiology.  Had a 2D echocardiogram showing grade 1 diastolic dysfunction, no ischemia on stress test and cardiac CT shows no significant coronary artery disease. Noted to have sinus bradycardia.  History noted for recurrent DVT, PE around 2009.  She is on Coumadin anticoagulation.  She also has history of hyperthyroidism with autonomously functioning multinodular goiter.  She follows with Dr. Elyse Hsu, Endocrinology.  Thyroid function tests in June 2019 were normal. She has history of intermittent GERD and uses Tums as needed.  Pets: Cat, no dogs, birds, farm animals Occupation: Retired Pharmacist, hospital, travel agent. Exposures: Notes a musty smell at home.  No mold, dampness, hot tub, Jacuzzi Smoking history: 60-pack-year smoker during college.  Quit in 1980 Travel history: No significant travel Relevant family history: No significant history of lung disease.  Interim history: She is here for review of PFTs and labs States that her dyspnea continues unchanged.  Outpatient Encounter Medications as of 05/30/2018  Medication Sig  . acetaminophen (TYLENOL) 325 MG tablet Take 650 mg by mouth every 6 (six) hours as needed for moderate pain.  Marland Kitchen ALPRAZolam (XANAX) 0.25 MG tablet Take 1 tablet (0.25 mg total) by mouth every 8 (eight) hours as needed for anxiety.  . B Complex Vitamins (B COMPLEX 1 PO)  Take 1 each by mouth 2 (two) times daily. sublingual  . denosumab (PROLIA) 60 MG/ML SOLN injection Inject 60 mg into the skin every 6 (six) months. Administer in upper arm, thigh, or abdomen  . fish oil-omega-3 fatty acids 1000 MG capsule Take 2 g by mouth daily.    . fluticasone (FLONASE) 50 MCG/ACT nasal spray USE 2 SPRAYS INTO EACH NOSTRIL AS NEEDED ALLERGIES.  . Multiple Vitamins-Minerals (HAIR SKIN AND NAILS FORMULA PO) Take 2 tablets by mouth daily.  . Multiple Vitamins-Minerals (MULTIVITAMIN PO) Take 1 tablet by mouth 2 (two) times daily.   Marland Kitchen rOPINIRole (REQUIP) 1 MG tablet Take 1 mg by mouth at bedtime.   Marland Kitchen warfarin (COUMADIN) 1 MG tablet Take 0.5-1 mg by mouth every evening. Takes half a tablet on Tuesday, Thursday, Saturday, Sunday and one tablet on Monday, Wednesday, Friday.  . [DISCONTINUED] escitalopram (LEXAPRO) 20 MG tablet Take 20 mg by mouth daily.     No facility-administered encounter medications on file as of 05/30/2018.    Physical Exam: Blood pressure 118/70, pulse (!) 59, height 5\' 1"  (1.549 m), weight 134 lb (60.8 kg), SpO2 98 %. Gen:      No acute distress HEENT:  EOMI, sclera anicteric Neck:     No masses; no thyromegaly Lungs:    Clear to auscultation bilaterally; normal respiratory effort CV:         Regular rate and rhythm; no murmurs Abd:      + bowel sounds; soft, non-tender; no palpable masses, no distension Ext:    No edema; adequate peripheral perfusion Skin:      Warm and dry; no rash Neuro:  alert and oriented x 3 Psych: normal mood and affect  Data Reviewed: Imaging: CT coronary 01/10/2018- mild pleural thickening.  Left base scarring/atelectasis.  No acute lung abnormality CT chest 11/17/2017- minimal bibasilar atelectasis/scarring.  I have reviewed the images personally.  PFTs: 05/30/2018 FVC 2.32 [87%), FEV1 1.92 [96%], F/F 83, TLC 127%, RV/TLC 136%, TLC 85% Overinflation, air-trapping.  FENO 05/10/2018-8  Labs: CBC 05/10/2018-WBC 6.6, eos  4.9%, absolute eosinophil count 323 IgE 05/10/2018-less than 2  Cardiac Echo 12/08/2017- normal LV size with EF 65%, normal RV size and systolic function.  No significant valvular abnormality.  Borderline pulmonary hypertension.  PA peak pressure 35.  Assessment:  Assessment for dyspnea Unclear etiology.  Suspicion for asthma and COPD is low as she has minimal smoking history and symptoms are not typical.  She does have history of remote DVT/PE on Coumadin anticoagulation.  Echocardiogram shows borderline pulmonary hypertension with no RV dysfunction.  Chest imaging from this year shows minimal basal scarring/atelectasis and no evidence of interstitial lung disease. She may have a component of anxiety or bradycardia that may be contributing to symptoms PFTs reviewed with no obstruction but there is mild overinflation and air trapping.  We will give a trial of inhaler to see if it improves symptoms I have discussed cardiopulmonary exercise test for further evaluation if she continues to be symptomatic.  I would like to see if she can mount a heart rate response on exertion given her mild bradycardia at baseline.  She wants to defer this test for now. Reevaluate in 3 months.  Plan/Recommendations: - Trial symbicort  Follow up in 3 months  Marshell Garfinkel MD [facility] 05/30/2018, 3:44 PM  CC: Leighton Ruff, MD

## 2018-05-30 NOTE — Progress Notes (Signed)
Patient completed full PFT today. 

## 2018-05-30 NOTE — Patient Instructions (Addendum)
I have reviewed your blood test and lung function test which are normal We will give you as sample of inhaler called Symbicort and prescription.  Use this for a month or 2 to see if it improves your breathing I will follow back with you in clinic in 3 months.  If you are still symptomatic then we can consider an exercise test for further evaluation.

## 2018-06-07 ENCOUNTER — Encounter: Payer: Self-pay | Admitting: Gynecology

## 2018-06-07 ENCOUNTER — Ambulatory Visit: Payer: PPO | Admitting: Gynecology

## 2018-06-07 VITALS — BP 118/66 | Ht 62.0 in | Wt 133.0 lb

## 2018-06-07 DIAGNOSIS — M810 Age-related osteoporosis without current pathological fracture: Secondary | ICD-10-CM

## 2018-06-07 DIAGNOSIS — Z01419 Encounter for gynecological examination (general) (routine) without abnormal findings: Secondary | ICD-10-CM

## 2018-06-07 DIAGNOSIS — Z1272 Encounter for screening for malignant neoplasm of vagina: Secondary | ICD-10-CM | POA: Diagnosis not present

## 2018-06-07 DIAGNOSIS — Z8041 Family history of malignant neoplasm of ovary: Secondary | ICD-10-CM | POA: Diagnosis not present

## 2018-06-07 DIAGNOSIS — N952 Postmenopausal atrophic vaginitis: Secondary | ICD-10-CM

## 2018-06-07 DIAGNOSIS — Z9189 Other specified personal risk factors, not elsewhere classified: Secondary | ICD-10-CM

## 2018-06-07 NOTE — Progress Notes (Signed)
    Emma Lopez Nov 05, 1945 [id number]        72 y.o.  G0P0 for breast and pelvic exam.  Also wants to discuss prophylactic oophorectomy as noted below  Past medical history,surgical history, problem list, medications, allergies, family history and social history were all reviewed and documented as reviewed in the EPIC chart.  ROS:  Performed with pertinent positives and negatives included in the history, assessment and plan.   Additional significant findings : None   Exam: Emma Lopez assistant Vitals:   06/07/18 1439  BP: 118/66  Weight: 133 lb (60.3 kg)  Height: 5\' 2"  (1.575 m)   Body mass index is 24.33 kg/m.  General appearance:  Normal affect, orientation and appearance. Skin: Grossly normal HEENT: Without gross lesions.  No cervical or supraclavicular adenopathy. Thyroid normal.  Lungs:  Clear without wheezing, rales or rhonchi Cardiac: RR, without RMG Abdominal:  Soft, nontender, without masses, guarding, rebound, organomegaly or hernia Breasts:  Examined lying and sitting without masses, retractions, discharge or axillary adenopathy. Pelvic:  Ext, BUS, Vagina: With atrophic changes.  Pap smear of vaginal cuff done  Adnexa: Without masses or tenderness    Anus and perineum: Normal   Rectovaginal: Normal sphincter tone without palpated masses or tenderness.    Assessment/Plan:  72 y.o. G0P0 female for rest and pelvic exam..   1. Postmenopausal/atrophic genital changes.  No significant menopausal symptoms. 2. Sister recently died of ovarian cancer this past year.  There is a question as to whether a paternal aunt had ovarian versus colon cancer.  The patient and her sister underwent genetic testing which was negative.  The patient still wants to discuss prophylactic oophorectomy.  She is status post TAH by Dr. Cherylann Lopez years ago.  We discussed the issues of prophylactic salpingo-oophorectomy with genetic screening negative history.  We discussed alternatives to include  increased ovarian surveillance such as ultrasound and Ca125 screening on a regular basis.  Risks of surgery weighed against missing early ovarian cancer discussed.  She does have a history of pulmonary embolus/DVT and is on Coumadin.  The perioperative risks associated with this was discussed.  Also understands this is no guarantee she will not develop peritoneal carcinoma identical to ovarian carcinoma in appearance.  So understands that although we will attempt laparoscopic she may require a larger incision with a more complex recovery period.  Patient is interested in pursuing this.  Will start with CA 125 and ovarian surveillance with ultrasound and then further discuss.  She will need to follow-up with her medical doctor for management of her Coumadin in the perioperative period. 3. 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  Pap smear of vaginal cuff done today.  TAH performed 2001 for CIN-3. 4. Mammography 12/2017.  Continue with annual mammography when due.  Breast exam normal today. 5. Colonoscopy planned next week. 6. Osteoporosis.  DEXA 2019 T score -3.2.  Is on Prolia and will continue on this. 7. Health maintenance.  No routine lab work done as patient does this elsewhere.  Follow-up for CA 125 and ultrasound.  Additional time in excess of her breast and pelvic exam was spent in direct face to face counseling and coordination of care in regards to her ovarian cancer family history and options for management.    Emma Auerbach MD, 3:11 PM 06/07/2018

## 2018-06-07 NOTE — Addendum Note (Signed)
Addended by: Nelva Nay on: 06/07/2018 03:29 PM   Modules accepted: Orders

## 2018-06-07 NOTE — Patient Instructions (Signed)
Follow-up for the ultrasound as scheduled. 

## 2018-06-08 LAB — CA 125: CA 125: 3 U/mL (ref <35)

## 2018-06-11 LAB — PAP IG W/ RFLX HPV ASCU

## 2018-06-12 ENCOUNTER — Encounter: Payer: PPO | Admitting: Gynecology

## 2018-06-13 DIAGNOSIS — R194 Change in bowel habit: Secondary | ICD-10-CM | POA: Diagnosis not present

## 2018-06-13 DIAGNOSIS — Z800 Family history of malignant neoplasm of digestive organs: Secondary | ICD-10-CM | POA: Diagnosis not present

## 2018-06-13 DIAGNOSIS — D123 Benign neoplasm of transverse colon: Secondary | ICD-10-CM | POA: Diagnosis not present

## 2018-06-13 DIAGNOSIS — K635 Polyp of colon: Secondary | ICD-10-CM | POA: Diagnosis not present

## 2018-06-15 DIAGNOSIS — D123 Benign neoplasm of transverse colon: Secondary | ICD-10-CM | POA: Diagnosis not present

## 2018-06-15 DIAGNOSIS — R194 Change in bowel habit: Secondary | ICD-10-CM | POA: Diagnosis not present

## 2018-06-18 DIAGNOSIS — Z7901 Long term (current) use of anticoagulants: Secondary | ICD-10-CM | POA: Diagnosis not present

## 2018-06-19 ENCOUNTER — Encounter: Payer: PPO | Admitting: Gynecology

## 2018-06-20 DIAGNOSIS — Z7901 Long term (current) use of anticoagulants: Secondary | ICD-10-CM | POA: Diagnosis not present

## 2018-06-22 DIAGNOSIS — Z7901 Long term (current) use of anticoagulants: Secondary | ICD-10-CM | POA: Diagnosis not present

## 2018-06-25 DIAGNOSIS — Z7901 Long term (current) use of anticoagulants: Secondary | ICD-10-CM | POA: Diagnosis not present

## 2018-07-02 DIAGNOSIS — E559 Vitamin D deficiency, unspecified: Secondary | ICD-10-CM | POA: Diagnosis not present

## 2018-07-02 DIAGNOSIS — M810 Age-related osteoporosis without current pathological fracture: Secondary | ICD-10-CM | POA: Diagnosis not present

## 2018-07-02 DIAGNOSIS — E782 Mixed hyperlipidemia: Secondary | ICD-10-CM | POA: Diagnosis not present

## 2018-07-02 DIAGNOSIS — Z7901 Long term (current) use of anticoagulants: Secondary | ICD-10-CM | POA: Diagnosis not present

## 2018-07-05 ENCOUNTER — Ambulatory Visit: Payer: PPO | Admitting: Gynecology

## 2018-07-05 ENCOUNTER — Ambulatory Visit (INDEPENDENT_AMBULATORY_CARE_PROVIDER_SITE_OTHER): Payer: PPO

## 2018-07-05 ENCOUNTER — Encounter: Payer: Self-pay | Admitting: Gynecology

## 2018-07-05 VITALS — BP 120/74

## 2018-07-05 DIAGNOSIS — R109 Unspecified abdominal pain: Secondary | ICD-10-CM

## 2018-07-05 DIAGNOSIS — Z8041 Family history of malignant neoplasm of ovary: Secondary | ICD-10-CM

## 2018-07-05 NOTE — Progress Notes (Signed)
    Emma Lopez 06/14/1946 [id number]        72 y.o.  G0P0 presents for ultrasound.  Sister recently died of ovarian cancer.  There is a question as to whether her paternal aunt had ovarian versus colon cancer.  Her sister underwent genetic testing which was negative.  She was questioning about whether to proceed with prophylactic salpingo-oophorectomy.  She is status post TAH in the past.  Recent CA 125 was 3.  Past medical history,surgical history, problem list, medications, allergies, family history and social history were all reviewed and documented in the EPIC chart.  Directed ROS with pertinent positives and negatives documented in the history of present illness/assessment and plan.  Exam: Vitals:   07/05/18 1023  BP: 120/74   General appearance:  Normal  Ultrasound transvaginal status post hysterectomy shows both right and left ovaries visualized, small with microcalcifications.  Right ovarian volume 2.1 cc left ovarian volume 0.9 cc.  No adnexal pathology noted.  Cul-de-sac negative.  Assessment/Plan:  72 y.o. G0P0 with history of sister with ovarian cancer, genetic testing negative and questionable aunt with ovarian versus colon.  Ovaries visualized and small today.  No pelvic fluid or other pathology noted.  Recent CA 125 was 3.  We again discussed the issues of prophylactic salpingo-oophorectomy versus surveillance with serial ultrasound and CA 125 measurements.  Risks of missed pathology versus risks of surgery discussed.  She does have a history of pulmonary embolus/DVT and is anticoagulated.  I discussed what is involved with the surgery and the expected intraoperative and postoperative courses.  I also reviewed with her that a prophylactic salpingo-oophorectomy does not guarantee she will not develop an ovarian like peritoneal carcinoma in the future.  The patient wants to think of all these issues and will follow-up after the holidays with her decision.  If she decides on  surveillance and we will plan on ultrasound with CA 125 measurements end of next year.    Anastasio Auerbach MD, 10:55 AM 07/05/2018

## 2018-07-05 NOTE — Patient Instructions (Signed)
Follow-up with your decision as far as proceeding with surgery versus continued surveillance

## 2018-07-09 DIAGNOSIS — E559 Vitamin D deficiency, unspecified: Secondary | ICD-10-CM | POA: Diagnosis not present

## 2018-07-09 DIAGNOSIS — Z7901 Long term (current) use of anticoagulants: Secondary | ICD-10-CM | POA: Diagnosis not present

## 2018-07-09 DIAGNOSIS — M810 Age-related osteoporosis without current pathological fracture: Secondary | ICD-10-CM | POA: Diagnosis not present

## 2018-07-09 DIAGNOSIS — E782 Mixed hyperlipidemia: Secondary | ICD-10-CM | POA: Diagnosis not present

## 2018-07-12 DIAGNOSIS — E559 Vitamin D deficiency, unspecified: Secondary | ICD-10-CM | POA: Diagnosis not present

## 2018-07-12 DIAGNOSIS — E042 Nontoxic multinodular goiter: Secondary | ICD-10-CM | POA: Diagnosis not present

## 2018-07-12 DIAGNOSIS — G2581 Restless legs syndrome: Secondary | ICD-10-CM | POA: Diagnosis not present

## 2018-07-12 DIAGNOSIS — F339 Major depressive disorder, recurrent, unspecified: Secondary | ICD-10-CM | POA: Diagnosis not present

## 2018-07-12 DIAGNOSIS — M810 Age-related osteoporosis without current pathological fracture: Secondary | ICD-10-CM | POA: Diagnosis not present

## 2018-07-12 DIAGNOSIS — K219 Gastro-esophageal reflux disease without esophagitis: Secondary | ICD-10-CM | POA: Diagnosis not present

## 2018-07-12 DIAGNOSIS — E782 Mixed hyperlipidemia: Secondary | ICD-10-CM | POA: Diagnosis not present

## 2018-07-12 DIAGNOSIS — G479 Sleep disorder, unspecified: Secondary | ICD-10-CM | POA: Diagnosis not present

## 2018-07-12 DIAGNOSIS — Z7901 Long term (current) use of anticoagulants: Secondary | ICD-10-CM | POA: Diagnosis not present

## 2018-07-12 DIAGNOSIS — F439 Reaction to severe stress, unspecified: Secondary | ICD-10-CM | POA: Diagnosis not present

## 2018-07-12 DIAGNOSIS — M797 Fibromyalgia: Secondary | ICD-10-CM | POA: Diagnosis not present

## 2018-07-12 DIAGNOSIS — Z0000 Encounter for general adult medical examination without abnormal findings: Secondary | ICD-10-CM | POA: Diagnosis not present

## 2018-08-14 ENCOUNTER — Encounter: Payer: Self-pay | Admitting: Gynecology

## 2018-08-17 DIAGNOSIS — Z7901 Long term (current) use of anticoagulants: Secondary | ICD-10-CM | POA: Diagnosis not present

## 2018-08-22 ENCOUNTER — Ambulatory Visit: Payer: PPO | Admitting: Pulmonary Disease

## 2018-08-24 DIAGNOSIS — Z86711 Personal history of pulmonary embolism: Secondary | ICD-10-CM | POA: Diagnosis not present

## 2018-08-24 DIAGNOSIS — Z7901 Long term (current) use of anticoagulants: Secondary | ICD-10-CM | POA: Diagnosis not present

## 2018-08-31 DIAGNOSIS — Z7901 Long term (current) use of anticoagulants: Secondary | ICD-10-CM | POA: Diagnosis not present

## 2018-09-11 DIAGNOSIS — Z7901 Long term (current) use of anticoagulants: Secondary | ICD-10-CM | POA: Diagnosis not present

## 2018-09-11 DIAGNOSIS — Z86711 Personal history of pulmonary embolism: Secondary | ICD-10-CM | POA: Diagnosis not present

## 2018-09-18 DIAGNOSIS — R300 Dysuria: Secondary | ICD-10-CM | POA: Diagnosis not present

## 2018-09-18 DIAGNOSIS — Z7901 Long term (current) use of anticoagulants: Secondary | ICD-10-CM | POA: Diagnosis not present

## 2018-09-24 ENCOUNTER — Ambulatory Visit
Admission: RE | Admit: 2018-09-24 | Discharge: 2018-09-24 | Disposition: A | Discharge status: Home / Self Care | Payer: PPO | Source: Ambulatory Visit | Attending: Family Medicine | Admitting: Family Medicine

## 2018-09-24 ENCOUNTER — Other Ambulatory Visit: Payer: Self-pay | Admitting: Family Medicine

## 2018-09-24 ENCOUNTER — Encounter: Payer: Self-pay | Admitting: Radiology

## 2018-09-24 DIAGNOSIS — R109 Unspecified abdominal pain: Secondary | ICD-10-CM | POA: Diagnosis not present

## 2018-09-24 DIAGNOSIS — K769 Liver disease, unspecified: Secondary | ICD-10-CM | POA: Diagnosis not present

## 2018-09-24 DIAGNOSIS — K921 Melena: Secondary | ICD-10-CM | POA: Diagnosis not present

## 2018-09-24 MED ORDER — IOPAMIDOL (ISOVUE-300) INJECTION 61%
100.0000 mL | Freq: Once | INTRAVENOUS | Status: AC | PRN
  Administered 2018-09-24: 100 mL via INTRAVENOUS

## 2018-09-27 DIAGNOSIS — R109 Unspecified abdominal pain: Secondary | ICD-10-CM | POA: Diagnosis not present

## 2018-09-27 DIAGNOSIS — K921 Melena: Secondary | ICD-10-CM | POA: Diagnosis not present

## 2018-10-02 DIAGNOSIS — D2371 Other benign neoplasm of skin of right lower limb, including hip: Secondary | ICD-10-CM | POA: Diagnosis not present

## 2018-10-02 DIAGNOSIS — L821 Other seborrheic keratosis: Secondary | ICD-10-CM | POA: Diagnosis not present

## 2018-10-02 DIAGNOSIS — D1801 Hemangioma of skin and subcutaneous tissue: Secondary | ICD-10-CM | POA: Diagnosis not present

## 2018-10-02 DIAGNOSIS — D225 Melanocytic nevi of trunk: Secondary | ICD-10-CM | POA: Diagnosis not present

## 2018-10-02 DIAGNOSIS — L820 Inflamed seborrheic keratosis: Secondary | ICD-10-CM | POA: Diagnosis not present

## 2018-10-02 DIAGNOSIS — L738 Other specified follicular disorders: Secondary | ICD-10-CM | POA: Diagnosis not present

## 2018-10-10 DIAGNOSIS — Z7901 Long term (current) use of anticoagulants: Secondary | ICD-10-CM | POA: Diagnosis not present

## 2018-10-10 DIAGNOSIS — Z800 Family history of malignant neoplasm of digestive organs: Secondary | ICD-10-CM | POA: Diagnosis not present

## 2018-10-10 DIAGNOSIS — K625 Hemorrhage of anus and rectum: Secondary | ICD-10-CM | POA: Diagnosis not present

## 2018-10-10 DIAGNOSIS — Z86010 Personal history of colonic polyps: Secondary | ICD-10-CM | POA: Diagnosis not present

## 2018-11-08 ENCOUNTER — Telehealth: Payer: Self-pay | Admitting: *Deleted

## 2018-11-08 DIAGNOSIS — R42 Dizziness and giddiness: Secondary | ICD-10-CM | POA: Diagnosis not present

## 2018-11-08 DIAGNOSIS — K625 Hemorrhage of anus and rectum: Secondary | ICD-10-CM | POA: Diagnosis not present

## 2018-11-08 DIAGNOSIS — Z7901 Long term (current) use of anticoagulants: Secondary | ICD-10-CM | POA: Diagnosis not present

## 2018-11-08 NOTE — Telephone Encounter (Signed)
Patient scheduled for Prolia  Injection on 11/22/18 @ 12:00 copay $247.45 normal calcium level 10/19

## 2018-11-22 ENCOUNTER — Other Ambulatory Visit: Payer: Self-pay

## 2018-11-22 ENCOUNTER — Ambulatory Visit (INDEPENDENT_AMBULATORY_CARE_PROVIDER_SITE_OTHER): Payer: PPO | Admitting: Gynecology

## 2018-11-22 DIAGNOSIS — M810 Age-related osteoporosis without current pathological fracture: Secondary | ICD-10-CM

## 2018-11-22 MED ORDER — DENOSUMAB 60 MG/ML ~~LOC~~ SOSY
60.0000 mg | PREFILLED_SYRINGE | Freq: Once | SUBCUTANEOUS | Status: AC
  Administered 2018-11-22: 60 mg via SUBCUTANEOUS

## 2018-11-22 NOTE — Telephone Encounter (Signed)
Injection given 11/22/18 KW

## 2018-11-23 ENCOUNTER — Encounter: Payer: Self-pay | Admitting: Gynecology

## 2018-11-29 DIAGNOSIS — Z7901 Long term (current) use of anticoagulants: Secondary | ICD-10-CM | POA: Diagnosis not present

## 2018-12-19 ENCOUNTER — Encounter: Payer: Self-pay | Admitting: Gynecology

## 2018-12-25 DIAGNOSIS — H2513 Age-related nuclear cataract, bilateral: Secondary | ICD-10-CM | POA: Diagnosis not present

## 2018-12-25 DIAGNOSIS — H40013 Open angle with borderline findings, low risk, bilateral: Secondary | ICD-10-CM | POA: Diagnosis not present

## 2018-12-27 DIAGNOSIS — Z7901 Long term (current) use of anticoagulants: Secondary | ICD-10-CM | POA: Diagnosis not present

## 2019-01-14 ENCOUNTER — Encounter: Payer: Self-pay | Admitting: Gynecology

## 2019-01-14 DIAGNOSIS — Z803 Family history of malignant neoplasm of breast: Secondary | ICD-10-CM | POA: Diagnosis not present

## 2019-01-14 DIAGNOSIS — Z1231 Encounter for screening mammogram for malignant neoplasm of breast: Secondary | ICD-10-CM | POA: Diagnosis not present

## 2019-01-17 DIAGNOSIS — M549 Dorsalgia, unspecified: Secondary | ICD-10-CM | POA: Diagnosis not present

## 2019-01-17 DIAGNOSIS — R300 Dysuria: Secondary | ICD-10-CM | POA: Diagnosis not present

## 2019-01-17 DIAGNOSIS — F419 Anxiety disorder, unspecified: Secondary | ICD-10-CM | POA: Diagnosis not present

## 2019-01-23 DIAGNOSIS — M549 Dorsalgia, unspecified: Secondary | ICD-10-CM | POA: Diagnosis not present

## 2019-01-23 DIAGNOSIS — R300 Dysuria: Secondary | ICD-10-CM | POA: Diagnosis not present

## 2019-01-23 DIAGNOSIS — Z7901 Long term (current) use of anticoagulants: Secondary | ICD-10-CM | POA: Diagnosis not present

## 2019-02-22 DIAGNOSIS — Z7901 Long term (current) use of anticoagulants: Secondary | ICD-10-CM | POA: Diagnosis not present

## 2019-03-26 DIAGNOSIS — Z7901 Long term (current) use of anticoagulants: Secondary | ICD-10-CM | POA: Diagnosis not present

## 2019-04-19 DIAGNOSIS — K625 Hemorrhage of anus and rectum: Secondary | ICD-10-CM | POA: Diagnosis not present

## 2019-04-19 DIAGNOSIS — Z7901 Long term (current) use of anticoagulants: Secondary | ICD-10-CM | POA: Diagnosis not present

## 2019-05-08 ENCOUNTER — Encounter: Payer: Self-pay | Admitting: Gynecology

## 2019-05-21 DIAGNOSIS — Z7901 Long term (current) use of anticoagulants: Secondary | ICD-10-CM | POA: Diagnosis not present

## 2019-06-07 DIAGNOSIS — S60131A Contusion of right middle finger with damage to nail, initial encounter: Secondary | ICD-10-CM | POA: Diagnosis not present

## 2019-06-18 DIAGNOSIS — Z7901 Long term (current) use of anticoagulants: Secondary | ICD-10-CM | POA: Diagnosis not present

## 2019-06-21 ENCOUNTER — Telehealth: Payer: Self-pay | Admitting: *Deleted

## 2019-06-21 DIAGNOSIS — M810 Age-related osteoporosis without current pathological fracture: Secondary | ICD-10-CM

## 2019-06-21 NOTE — Telephone Encounter (Addendum)
Annual exam 07/03/2019 UPCOMING TF  Calcium             Date needs labs recheck in 1 month due to abnormal labs  Upcoming dental procedures   Prior Authorization needed NO  Pt estimated Cost 222       Coverage Details: 20% once dose,20 % admin fee

## 2019-06-25 ENCOUNTER — Other Ambulatory Visit: Payer: Self-pay

## 2019-06-25 ENCOUNTER — Other Ambulatory Visit: Payer: PPO

## 2019-06-25 DIAGNOSIS — M810 Age-related osteoporosis without current pathological fracture: Secondary | ICD-10-CM

## 2019-06-26 LAB — CALCIUM: Calcium: 8.2 mg/dL — ABNORMAL LOW (ref 8.6–10.4)

## 2019-07-01 NOTE — Telephone Encounter (Signed)
Emma Lopez, Emma Block, MD sent to Enrigue Catena, RMA        Tell patient calcium with touch low. I would recommend calcium supplement daily i.e. 250 mg to 500 mg and then recheck her calcium in 1 month.   Will recheck calcium in 1 month the check SOB for prolia

## 2019-07-02 ENCOUNTER — Other Ambulatory Visit: Payer: Self-pay

## 2019-07-03 ENCOUNTER — Encounter: Payer: Self-pay | Admitting: Gynecology

## 2019-07-03 ENCOUNTER — Ambulatory Visit: Payer: PPO | Admitting: Gynecology

## 2019-07-03 VITALS — BP 118/78 | Ht 62.0 in | Wt 136.0 lb

## 2019-07-03 DIAGNOSIS — Z9189 Other specified personal risk factors, not elsewhere classified: Secondary | ICD-10-CM | POA: Diagnosis not present

## 2019-07-03 DIAGNOSIS — Z8041 Family history of malignant neoplasm of ovary: Secondary | ICD-10-CM

## 2019-07-03 DIAGNOSIS — Z01419 Encounter for gynecological examination (general) (routine) without abnormal findings: Secondary | ICD-10-CM

## 2019-07-03 DIAGNOSIS — Z803 Family history of malignant neoplasm of breast: Secondary | ICD-10-CM

## 2019-07-03 DIAGNOSIS — N952 Postmenopausal atrophic vaginitis: Secondary | ICD-10-CM

## 2019-07-03 DIAGNOSIS — Z1272 Encounter for screening for malignant neoplasm of vagina: Secondary | ICD-10-CM | POA: Diagnosis not present

## 2019-07-03 DIAGNOSIS — M810 Age-related osteoporosis without current pathological fracture: Secondary | ICD-10-CM | POA: Diagnosis not present

## 2019-07-03 NOTE — Addendum Note (Signed)
Addended by: Nelva Nay on: 07/03/2019 12:50 PM   Modules accepted: Orders

## 2019-07-03 NOTE — Progress Notes (Signed)
    Emma Lopez 1946-06-23 [id number]        73 y.o.  G0P0 for breast and pelvic exam.  Several issues noted below.  Past medical history,surgical history, problem list, medications, allergies, family history and social history were all reviewed and documented as reviewed in the EPIC chart.  ROS:  Performed with pertinent positives and negatives included in the history, assessment and plan.   Additional significant findings : None   Exam: [name] assistant Vitals:   07/03/19 1154  BP: 118/78  Weight: 136 lb (61.7 kg)  Height: 5\' 2"  (1.575 m)   Body mass index is 24.87 kg/m.  General appearance:  Normal affect, orientation and appearance. Skin: Grossly normal HEENT: Without gross lesions.  No cervical or supraclavicular adenopathy. Thyroid normal.  Lungs:  Clear without wheezing, rales or rhonchi Cardiac: RR, without RMG Abdominal:  Soft, nontender, without masses, guarding, rebound, organomegaly or hernia Breasts:  Examined lying and sitting without masses, retractions, discharge or axillary adenopathy. Pelvic:  Ext, BUS, Vagina: With atrophic changes.  Pap smear of vagina done  Adnexa: Without masses or tenderness    Anus and perineum: Normal   Rectovaginal: Normal sphincter tone without palpated masses or tenderness.    Assessment/Plan:  73 y.o. G0P0 female for breast and pelvic exam  1. Postmenopausal.  No significant menopausal symptoms.  Status post TAH in the past for CIN-3. 2. 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  Pap smear of vaginal cuff done today. 3. History of breast cancer and ovarian cancer in the family.  Underwent genetic testing with expanded panel which was negative.  We again discussed the issues of no guarantees she does not carry a gene not tested for and the issues of missed pathology.  We again discussed prophylactic salpingo-oophorectomy versus surveillance with ultrasound and CA-125's.  Again the risks of missed pathology discussed.  At this point  the patient is comfortable with ultrasound and CA-125 screening.  She had an ultrasound done 2019 which was negative showing 2 small atrophic ovaries.  CA 125 was 3.  We will go ahead and rescreen with ultrasound and CA-125 now and she will follow-up for this. 4. Colonoscopy 2019.  Repeat at their recommended interval. 5. Osteoporosis.  DEXA 2019 T score -2.3.  On Prolia and due for her shot now and will follow-up for this.  Plan repeat DEXA next year at 2-year interval. 6. Mammography 12/2018.  Continue with annual mammography when due.  Breast exam normal today. 7. Health maintenance.  No routine lab work done as patient does this elsewhere.  Follow-up for ultrasound and CA-125 results otherwise follow-up in 1 year, sooner as needed.   Anastasio Auerbach MD, 12:44 PM 07/03/2019

## 2019-07-03 NOTE — Patient Instructions (Signed)
Follow-up for the ultrasound as scheduled. 

## 2019-07-04 LAB — PAP IG W/ RFLX HPV ASCU

## 2019-07-05 LAB — CA 125: CA 125: 3 U/mL (ref <35)

## 2019-07-17 ENCOUNTER — Other Ambulatory Visit: Payer: Self-pay

## 2019-07-18 ENCOUNTER — Encounter: Payer: Self-pay | Admitting: Gynecology

## 2019-07-18 ENCOUNTER — Ambulatory Visit (INDEPENDENT_AMBULATORY_CARE_PROVIDER_SITE_OTHER): Payer: PPO

## 2019-07-18 ENCOUNTER — Ambulatory Visit (INDEPENDENT_AMBULATORY_CARE_PROVIDER_SITE_OTHER): Payer: PPO | Admitting: Gynecology

## 2019-07-18 VITALS — BP 122/78

## 2019-07-18 DIAGNOSIS — Z8041 Family history of malignant neoplasm of ovary: Secondary | ICD-10-CM | POA: Diagnosis not present

## 2019-07-18 DIAGNOSIS — Z803 Family history of malignant neoplasm of breast: Secondary | ICD-10-CM

## 2019-07-18 DIAGNOSIS — Z7901 Long term (current) use of anticoagulants: Secondary | ICD-10-CM | POA: Diagnosis not present

## 2019-07-18 NOTE — Progress Notes (Signed)
    LAKERRIA HOOF Jun 27, 1946 [id number]        73 y.o.  G0P0 presents for ultrasound.  She has a family history of breast and ovarian cancer.  Had negative genetic testing.  Recent CA 125 was 3.  Is for ultrasound surveillance for her ovaries.  Past medical history,surgical history, problem list, medications, allergies, family history and social history were all reviewed and documented in the EPIC chart.  Directed ROS with pertinent positives and negatives documented in the history of present illness/assessment and plan.  Exam: Vitals:   07/18/19 1002  BP: 122/78   General appearance:  Normal  Ultrasound transvaginal status post hysterectomy shows vaginal cuff normal with both ovaries identified, small and atrophic in appearance.  No free fluid.  Assessment/Plan:  73 y.o. G0P0 with family history of ovarian and breast cancer.  Genetically tested negative.  CA 125 normal.  Ultrasound normal.  Follow-up in 1 year for annual exam    Anastasio Auerbach MD, 10:22 AM 07/18/2019

## 2019-07-18 NOTE — Patient Instructions (Signed)
Follow-up in 1 year for annual exam, sooner as needed. 

## 2019-07-29 NOTE — Telephone Encounter (Signed)
Pt would like to wait till later to have labs drawn for calcium. Pt states she will call me back to make appt. Will close encounter and wait for pt call

## 2019-08-01 DIAGNOSIS — M545 Low back pain: Secondary | ICD-10-CM | POA: Diagnosis not present

## 2019-08-01 DIAGNOSIS — F419 Anxiety disorder, unspecified: Secondary | ICD-10-CM | POA: Diagnosis not present

## 2019-08-05 DIAGNOSIS — Z7901 Long term (current) use of anticoagulants: Secondary | ICD-10-CM | POA: Diagnosis not present

## 2019-08-12 DIAGNOSIS — E782 Mixed hyperlipidemia: Secondary | ICD-10-CM | POA: Diagnosis not present

## 2019-08-12 DIAGNOSIS — M810 Age-related osteoporosis without current pathological fracture: Secondary | ICD-10-CM | POA: Diagnosis not present

## 2019-08-12 DIAGNOSIS — Z7901 Long term (current) use of anticoagulants: Secondary | ICD-10-CM | POA: Diagnosis not present

## 2019-08-12 DIAGNOSIS — F339 Major depressive disorder, recurrent, unspecified: Secondary | ICD-10-CM | POA: Diagnosis not present

## 2019-09-10 DIAGNOSIS — Z7901 Long term (current) use of anticoagulants: Secondary | ICD-10-CM | POA: Diagnosis not present

## 2019-09-30 ENCOUNTER — Telehealth: Payer: Self-pay | Admitting: *Deleted

## 2019-09-30 DIAGNOSIS — M810 Age-related osteoporosis without current pathological fracture: Secondary | ICD-10-CM

## 2019-09-30 NOTE — Telephone Encounter (Signed)
Called pt will come in for [employer] verification has been sent awaiting Summary of benefits

## 2019-10-03 ENCOUNTER — Other Ambulatory Visit: Payer: PPO

## 2019-10-03 ENCOUNTER — Other Ambulatory Visit: Payer: Self-pay

## 2019-10-03 DIAGNOSIS — M810 Age-related osteoporosis without current pathological fracture: Secondary | ICD-10-CM

## 2019-10-03 LAB — CALCIUM: Calcium: 8.4 mg/dL — ABNORMAL LOW (ref 8.6–10.4)

## 2019-10-07 NOTE — Telephone Encounter (Signed)
Calcium labs drawn. 8.4 low  Pt needs normal calcium level to proceed with Prolia. Will route to Provider for recommendations.

## 2019-10-08 NOTE — Telephone Encounter (Signed)
Is she taking calcium? If not, then I would recommend 1200 mg calcium per day unless her primary has told her not to take calcium for any reason. Sorry, tried to send this back to the pool but its not an option.

## 2019-10-08 NOTE — Telephone Encounter (Signed)
Pr aware of recommendations. Will contact pt in 1 month to schedule a recheck of calcium labs

## 2019-10-10 DIAGNOSIS — Z7901 Long term (current) use of anticoagulants: Secondary | ICD-10-CM | POA: Diagnosis not present

## 2019-11-05 DIAGNOSIS — H52203 Unspecified astigmatism, bilateral: Secondary | ICD-10-CM | POA: Diagnosis not present

## 2019-11-05 DIAGNOSIS — H401411 Capsular glaucoma with pseudoexfoliation of lens, right eye, mild stage: Secondary | ICD-10-CM | POA: Diagnosis not present

## 2019-11-05 DIAGNOSIS — H524 Presbyopia: Secondary | ICD-10-CM | POA: Diagnosis not present

## 2019-11-05 DIAGNOSIS — H2513 Age-related nuclear cataract, bilateral: Secondary | ICD-10-CM | POA: Diagnosis not present

## 2019-11-06 DIAGNOSIS — E042 Nontoxic multinodular goiter: Secondary | ICD-10-CM | POA: Diagnosis not present

## 2019-11-06 DIAGNOSIS — M810 Age-related osteoporosis without current pathological fracture: Secondary | ICD-10-CM | POA: Diagnosis not present

## 2019-11-06 DIAGNOSIS — E559 Vitamin D deficiency, unspecified: Secondary | ICD-10-CM | POA: Diagnosis not present

## 2019-11-07 DIAGNOSIS — Z7901 Long term (current) use of anticoagulants: Secondary | ICD-10-CM | POA: Diagnosis not present

## 2019-11-08 DIAGNOSIS — E042 Nontoxic multinodular goiter: Secondary | ICD-10-CM | POA: Diagnosis not present

## 2019-11-08 DIAGNOSIS — M810 Age-related osteoporosis without current pathological fracture: Secondary | ICD-10-CM | POA: Diagnosis not present

## 2019-11-08 DIAGNOSIS — E559 Vitamin D deficiency, unspecified: Secondary | ICD-10-CM | POA: Diagnosis not present

## 2019-11-14 NOTE — Telephone Encounter (Signed)
Labs came back calcium 8.7

## 2019-11-14 NOTE — Telephone Encounter (Signed)
Deductible N/A  OOP MAX [id number]$0MET)  Annual exam 07/03/2019 TF  Calcium 8.7           Date 11/06/2019 [facility]  Upcoming dental procedures NO  Prior Authorization needed NO  Pt estimated Cost $222   APPT 11/15/2019    Coverage Details: 20% ONE DOSE,20% ADMIN FEE

## 2019-11-15 ENCOUNTER — Other Ambulatory Visit: Payer: Self-pay

## 2019-11-15 ENCOUNTER — Ambulatory Visit (INDEPENDENT_AMBULATORY_CARE_PROVIDER_SITE_OTHER): Payer: PPO | Admitting: Gynecology

## 2019-11-15 DIAGNOSIS — M810 Age-related osteoporosis without current pathological fracture: Secondary | ICD-10-CM

## 2019-11-15 DIAGNOSIS — M549 Dorsalgia, unspecified: Secondary | ICD-10-CM | POA: Diagnosis not present

## 2019-11-15 DIAGNOSIS — G2581 Restless legs syndrome: Secondary | ICD-10-CM | POA: Diagnosis not present

## 2019-11-15 DIAGNOSIS — E559 Vitamin D deficiency, unspecified: Secondary | ICD-10-CM | POA: Diagnosis not present

## 2019-11-15 DIAGNOSIS — M25559 Pain in unspecified hip: Secondary | ICD-10-CM | POA: Diagnosis not present

## 2019-11-15 MED ORDER — DENOSUMAB 60 MG/ML ~~LOC~~ SOSY
60.0000 mg | PREFILLED_SYRINGE | Freq: Once | SUBCUTANEOUS | Status: AC
  Administered 2019-11-15: 60 mg via SUBCUTANEOUS

## 2019-11-29 DIAGNOSIS — M545 Low back pain: Secondary | ICD-10-CM | POA: Diagnosis not present

## 2019-11-29 DIAGNOSIS — M7918 Myalgia, other site: Secondary | ICD-10-CM | POA: Diagnosis not present

## 2019-12-04 ENCOUNTER — Other Ambulatory Visit: Payer: Self-pay

## 2019-12-04 ENCOUNTER — Encounter: Payer: Self-pay | Admitting: Neurology

## 2019-12-04 ENCOUNTER — Ambulatory Visit (INDEPENDENT_AMBULATORY_CARE_PROVIDER_SITE_OTHER): Payer: PPO | Admitting: Neurology

## 2019-12-04 VITALS — BP 122/45 | HR 50 | Temp 97.3°F | Ht 62.0 in | Wt 133.0 lb

## 2019-12-04 DIAGNOSIS — R351 Nocturia: Secondary | ICD-10-CM | POA: Diagnosis not present

## 2019-12-04 DIAGNOSIS — G2581 Restless legs syndrome: Secondary | ICD-10-CM

## 2019-12-04 DIAGNOSIS — I82563 Chronic embolism and thrombosis of calf muscular vein, bilateral: Secondary | ICD-10-CM | POA: Diagnosis not present

## 2019-12-04 DIAGNOSIS — G4761 Periodic limb movement disorder: Secondary | ICD-10-CM

## 2019-12-04 DIAGNOSIS — G4701 Insomnia due to medical condition: Secondary | ICD-10-CM | POA: Diagnosis not present

## 2019-12-04 NOTE — Patient Instructions (Signed)

## 2019-12-04 NOTE — Progress Notes (Signed)
SLEEP MEDICINE CLINIC    Provider:  Larey Seat, MD  Primary Care Physician:  Leighton Ruff, [place] [address]     Referring Provider: Leighton Ruff, [place],   [id number]          Chief Complaint according to patient   Patient presents with:    . New Patient (Initial Visit)     pt alone, rm 10. pt states that she has struggled with RLS. she states that in past [name] initially was placed on seroquel ( 2004) which helped at night but during the day was groggy. currently she is using the requip and finds the 1 mg at bedtime was no longer effective.  she states that it bothersome to lay horizontal and it bothers during the daytime. she gets about 8 hrs of sleep, wakes up 3-4 times to void. She naps in daytime  last SS was shortly before she was seen at [facility] , [place] 2003.      HISTORY OF PRESENT ILLNESS:  Emma Lopez is a 75 y.o. year old White or Caucasian female patient seen here as a referral on 12/04/2019 from Dr. [name], MD  Chief concern according to patient : My RLS/ PLMs may cause me to not rest well, I am tired all day.   I have the pleasure of seeing Emma Lopez today, a right-handed White or Caucasian female with a fatigue disorder.  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   The patient had the first and last sleep study in the year 2003  with a result of PLM disorder - at [facility].   Sleep relevant medical history: Nocturia/ 3-4 ,DVT and PE in 2010 , LBBB diagnosed last year, Dr Golden Hurter- PVC on EKG, bradycardia.      Social history:  Patient is retired  2003 from [employer], and lives in a household with   alone.  Family status is single, no children.  Pets are present-  a cat.  Tobacco use was an occasional smoker  .   ETOH use- seldomly , Caffeine intake in form of1-2 cups of hot tea.no energy drinks. Regular exercise in form of walking  Hobbies : yoga      Sleep habits are as follows:  The patient's dinner time is between 6-7  PM. The patient goes to bed at 10 PM and continues to sleep for 2 hours, wakes for many  bathroom breaks. The preferred sleep position is mainly on her sides, with the support of  1 pillow.  Dreams are reportedly frequently. Marland Kitchen  9 AM is the usual rise time. The patient wakes up spontaneously at 7.30 .  She reports  feeling refreshed and restored in AM, without symptoms such as dry mouth , morning headaches and only sometimes residual fatigue.  Naps are taken infrequently, she wants to nap but RLS prevents her.    Review of Systems:  Out of a complete 14 system review, the patient complains of only the following symptoms, and all other reviewed systems are negative.:  Fatigue, sleepiness , snoring, fragmented sleep, Insomnia nocturia - and RLS .   How likely are you to doze in the following situations: 0 = not likely, 1 = slight chance, 2 = moderate chance, 3 = high chance   Sitting and Reading? Watching Television? Sitting inactive in a public place (theater or meeting)? As a passenger in a car for an hour without a break? Lying down in the afternoon when circumstances permit? Sitting and talking to someone? Sitting quietly after lunch without alcohol? In  a car, while stopped for a few minutes in traffic?   Total =1/ 24 points - Because legs don't let me -   FSS endorsed at  36 / 63 points.   Social History   Socioeconomic History  . Marital status: Single    Spouse name: Not on file  . Number of children: Not on file  . Years of education: Not on file  . Highest education level: Not on file  Occupational History  . Not on file  Tobacco Use  . Smoking status: Former Smoker    Packs/day: 0.10    Years: 6.00    Pack years: 0.60    Types: Cigarettes    Quit date: 08/01/1978    Years since quitting: 41.3  . Smokeless tobacco: Never Used  . Tobacco comment: only smoked 4-6 yrs 1 pack per month  Substance and Sexual Activity  . Alcohol use: Yes    Alcohol/week: 0.0 standard drinks    Comment: very rare  . Drug use: No  . Sexual activity: Not Currently    Birth control/protection: Surgical    Comment: HYSTERECTOMY-1st intercourse 27-Fewer than 5 partners  Other Topics Concern  . Not on file  Social History Narrative  . Not on file   Social Determinants of Health   Financial Resource Strain:   . Difficulty of Paying Living Expenses:   Food Insecurity:   . Worried About Charity fundraiser in the Last Year:   . Arboriculturist in the Last Year:   Transportation Needs:   . Film/video editor (Medical):   Marland Kitchen Lack of Transportation (Non-Medical):   Physical Activity:   . Days of Exercise per Week:   . Minutes of Exercise per Session:   Stress:   . Feeling of Stress :   Social Connections:   . Frequency of Communication with Friends and Family:   . Frequency of Social Gatherings with Friends and Family:   . Attends Religious Services:   . Active Member of Clubs or Organizations:   . Attends Archivist Meetings:   Marland Kitchen Marital Status:     Family History  Problem Relation Age of Onset  . Hypertension Mother   . Breast cancer Mother        107's  . Cancer Father        COLON  . Hypertension Sister   .  Stroke Sister   . Breast cancer Maternal Aunt        Age 3's  . Cancer Maternal Aunt        Melanoma  . Alzheimer's disease Maternal Aunt   . Cancer Paternal Aunt        OVARIAN and COLON  . Breast cancer Maternal Aunt        70's  . Cancer Maternal Aunt  Colon CA  . Alzheimer's disease Maternal Aunt     Past Medical History:  Diagnosis Date  . Abnormal EKG   . Anxiety   . Arthritis    oa  . Bradycardia 12/14/2017  . CIN III (cervical intraepithelial neoplasia III) 2000  . Complication of anesthesia    did well last 2 times with procedures  . Depression   . 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 DVT (deep venous thrombosis) ([place]) 01/2009   LEFT LEG  . Dyspnea on exertion 10/2017  . Elevated triglycerides with high cholesterol   . Endometriosis   . Fibromyalgia   . GERD (gastroesophageal reflux disease)   . History of colon polyps   . History of hiatal hernia    told by some md has, some say not  . IBS (irritable bowel syndrome) 2015  . Multinodular goiter   . Osteoporosis 12/2017   T score -3.2 stable from prior study  . PE (pulmonary embolism)   . PONV (postoperative nausea and vomiting)   . Restless legs syndrome   . Sleep disorder   . Thyroid disease    HYPERTHYROIDISM  . Vitamin D deficiency     Past Surgical History:  Procedure Laterality Date  . ABDOMINAL HYSTERECTOMY  2001   TAH, partial  . APPENDECTOMY  1978  . COLONOSCOPY WITH PROPOFOL N/A 12/07/2015   Procedure: COLONOSCOPY WITH PROPOFOL;  Surgeon: Garlan Fair, MD;  Location: [facility] END[facility]COPY;  Service: Endoscopy;  Laterality: N/A;  . colonscopy  7 yrs ago   other in past  . ESOPHAGOGASTRODUODENOSCOPY (EGD) WITH PROPOFOL N/A 12/07/2015   Procedure: ESOPHAGOGASTRODUODENOSCOPY (EGD) WITH PROPOFOL;  Surgeon: Garlan Fair, MD;  Location: [facility] END[facility];  Service: Endoscopy;  Laterality: N/A;  . ESOPHAGOGASTRODUODENOSCOPY ENDOSCOPY  yrs ago  . FACIAL COSMETIC SURGERY    . GYNECOLOGIC CRYOSURGERY    .  MYOMECTOMY    . TONSILLECTOMY       Current Outpatient Medications on File Prior to Visit  Medication Sig Dispense Refill  . acetaminophen (TYLENOL) 325 MG tablet Take 650 mg by mouth every 6 (six) hours as needed for moderate pain.    Marland Kitchen ALPRAZolam (XANAX) 0.25 MG tablet Take 1 tablet (0.25 mg total) by mouth every 8 (eight) hours as needed for anxiety. 30 tablet 0  . B Complex Vitamins (B COMPLEX 1 PO) Take 1 each by mouth 2 (two) times daily. sublingual    . denosumab (PROLIA) 60 MG/ML SOLN injection Inject 60 mg into the skin every 6 (six) months. Administer in upper arm, thigh, or abdomen 180 mL 2  . escitalopram (LEXAPRO) 20 MG tablet Take 20 mg by mouth daily.    . fluticasone (FLONASE) 50 MCG/ACT nasal spray USE 2 SPRAYS INTO EACH NOSTRIL AS NEEDED ALLERGIES.  1  . Multiple Vitamins-Minerals (HAIR SKIN AND NAILS FORMULA PO) Take 2 tablets by mouth daily.    . Multiple Vitamins-Minerals (MULTIVITAMIN PO) Take 1 tablet by mouth 2 (two) times daily.     Marland Kitchen rOPINIRole (REQUIP) 1 MG tablet Take 1 mg by mouth at bedtime.     Marland Kitchen warfarin (COUMADIN) 1 MG tablet Take 0.5-1 mg by mouth every evening. Takes half a tablet on Tuesday, Thursday, Saturday, Sunday and one tablet on Monday, Wednesday, Friday.    . budesonide-formoterol (SYMBICORT) 160-4.5 MCG/ACT inhaler Inhale 2 puffs into the lungs every 12 (twelve) hours. (Patient not taking: Reported on 12/04/2019) 1 Inhaler 6   No current facility-administered medications on file prior to  visit.    Allergies  Allergen Reactions  . Sulfa Antibiotics Rash    Physical exam:  Today's Vitals   12/04/19 1044  BP: (!) 122/45  Pulse: (!) 50  Temp: (!) 97.3 F (36.3 C)  Weight: 133 lb (60.3 kg)  Height: 5\' 2"  (1.575 m)   Body mass index is 24.33 kg/m.   Wt Readings from Last 3 Encounters:  12/04/19 133 lb (60.3 kg)  07/03/19 136 lb (61.7 kg)  06/07/18 133 lb (60.3 kg)     Ht Readings from Last 3 Encounters:  12/04/19 5\' 2"  (1.575 m)    07/03/19 5\' 2"  (1.575 m)  06/07/18 5\' 2"  (1.575 m)      General: The patient is awake, alert and appears not in acute distress. The patient is well groomed. Head: Normocephalic, atraumatic. Neck is supple. Mallampati  3,  neck circumference:14 inches . Nasal airflow  patent.  Retrognathia is mildly present  Dental status: native teeth  Cardiovascular:  Regular rate and cardiac rhythm by pulse,  without distended neck veins. Respiratory: Lungs are clear to auscultation.  Skin:  Without evidence of ankle edema, or rash. Trunk: The patient's posture is erect.   Neurologic exam : The patient is awake and alert, oriented to place and time.   Memory subjective described as intact.  Attention span & concentration ability appears normal.  Speech is fluent,  without  dysarthria, dysphonia or aphasia.  Mood and affect are appropriate.   Cranial nerves: no loss of smell or taste reported - Pupils are equal and briskly reactive to light. Funduscopic exam deferred.  Extraocular movements in vertical and horizontal planes were intact and without nystagmus. No Diplopia.Visual fields by finger perimetry are intact. Hearing was intact to soft voice and finger rubbing.  Facial sensation intact to fine touch. Facial motor strength is symmetric and tongue and uvula move midline.  Neck ROM : rotation, tilt and flexion extension were normal for age and shoulder shrug was symmetrical.    Motor exam:  Symmetric bulk, tone and ROM.   Normal tone without cog wheeling, symmetric grip strength .   Sensory:  Fine touch, pinprick and vibration were tested  and  normal.  Proprioception tested in the upper extremities was normal.   Coordination: Rapid alternating movements in the fingers/hands were of normal speed.  The Finger-to-nose maneuver was intact without evidence of ataxia, dysmetria or tremor.   Gait and station: Patient could rise unassisted from a seated position, walked without assistive device.   Stance is of normal width/ base .  Toe and heel walk were deferred.  Deep tendon reflexes: in the  upper and lower extremities are symmetric and intact.  Babinski response was deferred .    My goal at this time is to obtain an assisted attended sleep study I want to see the restless legs and the PLM's.  There has been no documentation of those for 18 years.  I also will order TIBC, Ferritin and sed rate.    After spending a total time of  40  minutes face to face and additional time for physical and neurologic examination, review of laboratory studies,  personal review of imaging studies, reports and results of other testing and review of referral information / records as far as provided in visit, I have established the following assessments:  1)  PLMs since 2003, RLS more recent onset.  2)  History of DVT and PE.  3)  Insomnia from RLS and nocturia.  My Plan is to proceed with: Mrs. Kuzia never had a sleep study with me but presented in 2003 with a sleep study from [facility] which had a notably high amount of periodic limb movements.  In the meantime she just needed a medication that allowed her to go to sleep easily but she does not initially presenting with restless legs.  Now restless legs have become much more dominant that hindered her to take a nap and they also make sometimes very soothing sleep pattern after one of her frequent bathroom breaks.  She has a history of DVT which could have been worsening her leg sensation and movement, at one time she suffered a pulmonary embolism related to a deep venous thrombosis in the leg this was around 2010 she has sometimes experienced a breathing difficulty on exhalation not inhalation.  She has been treated with vitamin D she is followed for osteoporosis Dr. Elyse Hsu had looked at her from the endocrinology standpoint she takes propranolol at night which has helped, she is no longer taking Seroquel which we have tried for her a decade ago  with the intention to help her insomnia.  However it had extreme groggy making side effect for the following day.   I would like to thank Leighton Ruff, MD and Leighton Ruff, [place],  [place] [address] for allowing me to meet with and to take care of this pleasant patient.    I plan to follow up either personally or through our NP within 2-3  month.   CC: I will share my notes with PCP   Electronically signed by: Larey Seat, MD 12/04/2019 11:04 AM  [facility] certified by The [employer] of Sleep Medicine and Diplomate of the [employer] of Sleep Medicine. Board certified In Neurology through the [place], Fellow of the [employer] of Neurology. Medical Director of [employer].

## 2019-12-05 DIAGNOSIS — M5416 Radiculopathy, lumbar region: Secondary | ICD-10-CM | POA: Diagnosis not present

## 2019-12-05 DIAGNOSIS — Z7901 Long term (current) use of anticoagulants: Secondary | ICD-10-CM | POA: Diagnosis not present

## 2019-12-05 DIAGNOSIS — M6281 Muscle weakness (generalized): Secondary | ICD-10-CM | POA: Diagnosis not present

## 2019-12-05 DIAGNOSIS — M545 Low back pain: Secondary | ICD-10-CM | POA: Diagnosis not present

## 2019-12-05 DIAGNOSIS — M25551 Pain in right hip: Secondary | ICD-10-CM | POA: Diagnosis not present

## 2019-12-05 LAB — IRON,TIBC AND FERRITIN PANEL
Ferritin: 60 ng/mL (ref 15–150)
Iron Saturation: 32 % (ref 15–55)
Iron: 111 ug/dL (ref 27–139)
Total Iron Binding Capacity: 351 ug/dL (ref 250–450)
UIBC: 240 ug/dL (ref 118–369)

## 2019-12-05 NOTE — Progress Notes (Signed)
Normal iron metabolism, no deficiency.

## 2019-12-09 DIAGNOSIS — M6281 Muscle weakness (generalized): Secondary | ICD-10-CM | POA: Diagnosis not present

## 2019-12-09 DIAGNOSIS — M545 Low back pain: Secondary | ICD-10-CM | POA: Diagnosis not present

## 2019-12-09 DIAGNOSIS — M5416 Radiculopathy, lumbar region: Secondary | ICD-10-CM | POA: Diagnosis not present

## 2019-12-09 DIAGNOSIS — M25551 Pain in right hip: Secondary | ICD-10-CM | POA: Diagnosis not present

## 2019-12-10 ENCOUNTER — Telehealth: Payer: Self-pay | Admitting: Neurology

## 2019-12-10 NOTE — Telephone Encounter (Signed)
Called the patient and advised of the normal lab findings. Pt verbalized understanding. Pt had no questions at this time but was encouraged to call back if questions arise.

## 2019-12-10 NOTE — Telephone Encounter (Signed)
-----   Message from Larey Seat, MD sent at 12/05/2019 12:57 PM EDT ----- Normal iron metabolism, no deficiency.

## 2019-12-16 DIAGNOSIS — M5416 Radiculopathy, lumbar region: Secondary | ICD-10-CM | POA: Diagnosis not present

## 2019-12-16 DIAGNOSIS — M6281 Muscle weakness (generalized): Secondary | ICD-10-CM | POA: Diagnosis not present

## 2019-12-16 DIAGNOSIS — M545 Low back pain: Secondary | ICD-10-CM | POA: Diagnosis not present

## 2019-12-16 DIAGNOSIS — M25551 Pain in right hip: Secondary | ICD-10-CM | POA: Diagnosis not present

## 2019-12-18 DIAGNOSIS — F339 Major depressive disorder, recurrent, unspecified: Secondary | ICD-10-CM | POA: Diagnosis not present

## 2019-12-18 DIAGNOSIS — Z1331 Encounter for screening for depression: Secondary | ICD-10-CM | POA: Diagnosis not present

## 2019-12-18 DIAGNOSIS — Z7901 Long term (current) use of anticoagulants: Secondary | ICD-10-CM | POA: Diagnosis not present

## 2019-12-18 DIAGNOSIS — R7309 Other abnormal glucose: Secondary | ICD-10-CM | POA: Diagnosis not present

## 2019-12-18 DIAGNOSIS — Z86718 Personal history of other venous thrombosis and embolism: Secondary | ICD-10-CM | POA: Diagnosis not present

## 2019-12-19 ENCOUNTER — Other Ambulatory Visit: Payer: Self-pay

## 2019-12-19 ENCOUNTER — Ambulatory Visit (INDEPENDENT_AMBULATORY_CARE_PROVIDER_SITE_OTHER): Payer: PPO | Admitting: Neurology

## 2019-12-19 DIAGNOSIS — R351 Nocturia: Secondary | ICD-10-CM

## 2019-12-19 DIAGNOSIS — I82563 Chronic embolism and thrombosis of calf muscular vein, bilateral: Secondary | ICD-10-CM

## 2019-12-19 DIAGNOSIS — G4701 Insomnia due to medical condition: Secondary | ICD-10-CM

## 2019-12-19 DIAGNOSIS — G4761 Periodic limb movement disorder: Secondary | ICD-10-CM

## 2019-12-19 DIAGNOSIS — G2581 Restless legs syndrome: Secondary | ICD-10-CM

## 2019-12-23 DIAGNOSIS — M545 Low back pain: Secondary | ICD-10-CM | POA: Diagnosis not present

## 2019-12-23 DIAGNOSIS — M25551 Pain in right hip: Secondary | ICD-10-CM | POA: Diagnosis not present

## 2019-12-23 DIAGNOSIS — M6281 Muscle weakness (generalized): Secondary | ICD-10-CM | POA: Diagnosis not present

## 2019-12-23 DIAGNOSIS — M5416 Radiculopathy, lumbar region: Secondary | ICD-10-CM | POA: Diagnosis not present

## 2019-12-27 ENCOUNTER — Encounter: Payer: Self-pay | Admitting: Nurse Practitioner

## 2020-01-02 ENCOUNTER — Telehealth: Payer: Self-pay | Admitting: Neurology

## 2020-01-02 NOTE — Telephone Encounter (Signed)
Spoke with patient and informed her sleep study results are not finalized. She stated if definite issues are found she prefers to come into office to discuss. She will be going out of town x 1 month in a few weeks and asked for FU to be on schedule. When she gets sleep study results she'll let RN know if she still wants FU. Scheduled FU for 01/16/20. Patient verbalized understanding, appreciation.

## 2020-01-02 NOTE — Telephone Encounter (Signed)
Pt called to get an update on her sleep study results

## 2020-01-07 DIAGNOSIS — G2581 Restless legs syndrome: Secondary | ICD-10-CM | POA: Insufficient documentation

## 2020-01-07 DIAGNOSIS — R351 Nocturia: Secondary | ICD-10-CM | POA: Insufficient documentation

## 2020-01-07 DIAGNOSIS — G4701 Insomnia due to medical condition: Secondary | ICD-10-CM | POA: Insufficient documentation

## 2020-01-07 NOTE — Procedures (Signed)
PATIENT'S NAME:  Emma Lopez, Emma Lopez DOB:      1945-10-14      MR#:    [id number]     DATE OF RECORDING: 12/19/2019 REFERRING M.D.:  Leighton Ruff, MD Study Performed:   Baseline Polysomnogram HISTORY:  I have the pleasure of seeing Emma Lopez again, a right-handed  Caucasian female with a chief history of RLS, fatigue and irregular EKG and a medical history of Anxiety, Arthritis, Bradycardia (12/14/2017), CIN III (cervical intraepithelial neoplasia III) (2000), Nocturia, Fatigue- Depression, DES exposure in utero, DVT (deep venous thrombosis) ([place]) (01/2009), Dyspnea on exertion (10/2017), Elevated triglycerides with high cholesterol, Endometriosis, GERD (gastroesophageal reflux disease), IBS (irritable bowel syndrome) (2015), Multinodular goiter, Osteoporosis (12/2017), PE (pulmonary embolism- 2010- after I had last seen her), PONV (postoperative nausea and vomiting), Thyroid disease, and Vitamin D deficiency. Her last sleep study was at [facility] in 2003.   The patient endorsed the Epworth Sleepiness Scale at 1 point.   The patient's weight 133 pounds with a height of 62 (inches), resulting in a BMI of 24.3 kg/m2. The patient's neck circumference measured 14 inches.  CURRENT MEDICATIONS: Tylenol, Xanax, Vitamin B complex, Prolia, Lexapro, Flonase, Multivitamins, Requip, Coumadin, Symbicort- Requip was taken at about 9 PM    PROCEDURE:  This is a multichannel digital polysomnogram utilizing the Somnostar 11.2 system.  Electrodes and sensors were applied and monitored per AASM Specifications.   EEG, EOG, Chin and Limb EMG, were sampled at 200 Hz.  ECG, Snore and Nasal Pressure, Thermal Airflow, Respiratory Effort, CPAP Flow and Pressure, Oximetry was sampled at 50 Hz. Digital video and audio were recorded.      BASELINE STUDY: Lights Out was at 22:00 and Lights On at 04:49.  Total recording time (TRT) was 409.5 minutes, with a total sleep time (TST) of 287 minutes.   The patient's sleep latency was 76.5  minutes.  REM latency was 0 minutes.  The sleep efficiency was 70.1 %.     SLEEP ARCHITECTURE: WASO (Wake after sleep onset) was 43 minutes.  There were 9.5 minutes in Stage N1, 242.5 minutes Stage N2, 35 minutes Stage N3 and 0 minutes in Stage REM.  The percentage of Stage N1 was 3.3%, Stage N2 was 84.5%, Stage N3 was 12.2% and Stage R (REM sleep) was 0%.   RESPIRATORY ANALYSIS:  There were a total of 18 respiratory events:  2 obstructive apneas, 0 central apneas and 3 mixed apneas with 13 hypopneas. The total APNEA/HYPOPNEA INDEX (AHI) was 3.8/hour.  0 events occurred in REM sleep and 29 events in NREM. The REM AHI was  0 /hour, versus a non-REM AHI of 3.8. The patient spent 0 minutes of total sleep time in the supine position and 287 minutes in non-supine. The supine AHI was 0.0 versus a non-supine AHI of 3.7/h.  OXYGEN SATURATION & C02:  The Wake baseline 02 saturation was 96%, with the lowest being 89%. Time spent below 89% saturation equaled 0 minutes.  The arousals were noted as: 63 were spontaneous, 0 were associated with PLMs, 10 were associated with respiratory events. The patient had a total of 0 Periodic Limb Movements.  The Periodic Limb Movement (PLM) index was 0 and the PLM Arousal index was 0/hour.   Audio and video analysis did not show any abnormal or unusual movements, behaviors, phonations or vocalizations.  There was no REM sleep. The patient took 2 bathroom breaks. Mild Snoring was noted. EKG was in sinus rhythm.    IMPRESSION:  1. No  Periodic Limb Movement Disorder (PLMD) and no apnea of clinical significance noted.  2. Mild Primary Snoring.   RECOMMENDATIONS:  The patient described Restless legs as the main problem to initiate sleep, but Requip did prevent translation into PLMs. There was still a prolonged sleep latency noted and many limb movements during wakefulness.  The dose of Requip may have to be adjusted, but another intervention is not needed.   I certify  that I have reviewed the entire raw data recording prior to the issuance of this report in accordance with the Standards of Accreditation of the American Academy of Sleep Medicine (AASM)   Larey Seat, MD Diplomat, American Board of Psychiatry and Neurology  Diplomat, American Board of Sleep Medicine Market researcher, [facility] [employer]

## 2020-01-07 NOTE — Progress Notes (Signed)
The supine AHI was 0.0  versus a non-supine AHI of 3.7/h.   OXYGEN SATURATION & C02: The Wake baseline 02 saturation was  96%, with the lowest being 89%. Time spent below 89% saturation  equaled 0 minutes.   The arousals were noted as: 63 were spontaneous, 0 were  associated with PLMs, 10 were associated with respiratory events.  The patient had a total of 0 Periodic Limb Movements. The  Periodic Limb Movement (PLM) index was 0 and the PLM Arousal  index was 0/hour.    Audio and video analysis did not show any abnormal or unusual  movements, behaviors, phonations or vocalizations. There was no  REM sleep. The patient took 2 bathroom breaks.  Mild Snoring was noted. EKG was in sinus rhythm.    IMPRESSION:   1. No Periodic Limb Movement Disorder (PLMD) and no sleep apnea of  clinical significance noted.  2. Mild Primary Snoring.    RECOMMENDATIONS:   The patient described Restless legs as the main problem to  initiate sleep, but Requip did prevent translation into PLMs.  There was still a prolonged sleep latency noted and many limb  movements during wakefulness.  The dose of Requip may have to be adjusted, but another  intervention is not needed.   If the patient desires to follow up, please make an appointment with NP.

## 2020-01-08 ENCOUNTER — Telehealth: Payer: Self-pay

## 2020-01-08 DIAGNOSIS — M797 Fibromyalgia: Secondary | ICD-10-CM | POA: Diagnosis not present

## 2020-01-08 DIAGNOSIS — G2581 Restless legs syndrome: Secondary | ICD-10-CM | POA: Diagnosis not present

## 2020-01-08 DIAGNOSIS — R946 Abnormal results of thyroid function studies: Secondary | ICD-10-CM | POA: Diagnosis not present

## 2020-01-08 DIAGNOSIS — R5381 Other malaise: Secondary | ICD-10-CM | POA: Diagnosis not present

## 2020-01-08 DIAGNOSIS — Z86718 Personal history of other venous thrombosis and embolism: Secondary | ICD-10-CM | POA: Diagnosis not present

## 2020-01-08 DIAGNOSIS — R7309 Other abnormal glucose: Secondary | ICD-10-CM | POA: Diagnosis not present

## 2020-01-08 DIAGNOSIS — E559 Vitamin D deficiency, unspecified: Secondary | ICD-10-CM | POA: Diagnosis not present

## 2020-01-08 NOTE — Telephone Encounter (Signed)
IMPRESSION:   1. No Periodic Limb Movement Disorder (PLMD) and no sleep apnea of  clinical significance noted.  2. Mild Primary Snoring.    RECOMMENDATIONS:   The patient described Restless legs as the main problem to  initiate sleep, but Requip did prevent translation into PLMs.  There was still a prolonged sleep latency noted and many limb  movements during wakefulness.  The dose of Requip may have to be adjusted, but another  intervention is not needed.   If the patient desires to follow up, please make an appointment with NP.    I called pt. No answer, left a message asking pt to call me back.

## 2020-01-09 NOTE — Telephone Encounter (Signed)
Pt returned call. Please call back when available. 

## 2020-01-13 MED ORDER — ROPINIROLE HCL 1 MG PO TABS: 1.0000 mg | ORAL_TABLET | Freq: Every day | ORAL | 2 refills | Status: DC

## 2020-01-13 NOTE — Telephone Encounter (Signed)
I called the pt back and advised of results.  Pt wanted to know if Dr. Brett Fairy could make any recommendations on what the Requip dosage should be? She sts she is no longer a pt of Dr. [name] who started her on the requip. Pt is now established with Dr. Jacalyn Lefevre but still wanted to see if Dr. Brett Fairy could recommend her medication dosage and send to [facility] on [address].  I have sent sleep study report to Dr. Jacalyn Lefevre.

## 2020-01-13 NOTE — Addendum Note (Signed)
Addended by: Larey Seat on: 01/13/2020 01:21 PM   Modules accepted: Orders

## 2020-01-13 NOTE — Telephone Encounter (Signed)
Pt has called Megan,RN back, please call

## 2020-01-13 NOTE — Telephone Encounter (Signed)
No problem-  I can refill the dose as originally chosen by Dr. [name]. I have 1 mg at night on record- and I am happy to provide that for her.

## 2020-01-14 NOTE — Telephone Encounter (Signed)
Pt has called stating that her PCP Dr Jacalyn Lefevre agrees that [name] should make the suggestion of an increased dosage on the rOPINIRole (REQUIP) 1 MG tablet.  Pt states once [name] has done this pt would then contact Dr Jacalyn Lefevre to write the script for the increased dosage.  Please call pt to discuss further

## 2020-01-15 ENCOUNTER — Encounter: Payer: Self-pay | Admitting: Neurology

## 2020-01-15 ENCOUNTER — Encounter: Payer: Self-pay | Admitting: Obstetrics and Gynecology

## 2020-01-15 DIAGNOSIS — Z1231 Encounter for screening mammogram for malignant neoplasm of breast: Secondary | ICD-10-CM | POA: Diagnosis not present

## 2020-01-16 ENCOUNTER — Ambulatory Visit: Payer: Self-pay | Admitting: Neurology

## 2020-02-13 DIAGNOSIS — M79645 Pain in left finger(s): Secondary | ICD-10-CM | POA: Diagnosis not present

## 2020-02-13 DIAGNOSIS — M79644 Pain in right finger(s): Secondary | ICD-10-CM | POA: Diagnosis not present

## 2020-02-13 DIAGNOSIS — M180 Bilateral primary osteoarthritis of first carpometacarpal joints: Secondary | ICD-10-CM | POA: Diagnosis not present

## 2020-02-19 NOTE — Telephone Encounter (Signed)
PROLIA GIVEN 11/18/2019 NEXT INJECTION 05/20/2020 

## 2020-03-24 DIAGNOSIS — R946 Abnormal results of thyroid function studies: Secondary | ICD-10-CM | POA: Diagnosis not present

## 2020-03-24 DIAGNOSIS — E785 Hyperlipidemia, unspecified: Secondary | ICD-10-CM | POA: Diagnosis not present

## 2020-03-24 DIAGNOSIS — E559 Vitamin D deficiency, unspecified: Secondary | ICD-10-CM | POA: Diagnosis not present

## 2020-03-25 DIAGNOSIS — M180 Bilateral primary osteoarthritis of first carpometacarpal joints: Secondary | ICD-10-CM | POA: Diagnosis not present

## 2020-03-25 DIAGNOSIS — M79644 Pain in right finger(s): Secondary | ICD-10-CM | POA: Diagnosis not present

## 2020-03-25 DIAGNOSIS — M79645 Pain in left finger(s): Secondary | ICD-10-CM | POA: Diagnosis not present

## 2020-03-25 DIAGNOSIS — M1811 Unilateral primary osteoarthritis of first carpometacarpal joint, right hand: Secondary | ICD-10-CM | POA: Diagnosis not present

## 2020-03-30 ENCOUNTER — Ambulatory Visit: Payer: PPO | Admitting: Obstetrics and Gynecology

## 2020-03-30 ENCOUNTER — Telehealth: Payer: Self-pay | Admitting: *Deleted

## 2020-03-30 ENCOUNTER — Encounter: Payer: Self-pay | Admitting: Obstetrics and Gynecology

## 2020-03-30 ENCOUNTER — Other Ambulatory Visit: Payer: Self-pay

## 2020-03-30 VITALS — BP 118/76

## 2020-03-30 DIAGNOSIS — R1030 Lower abdominal pain, unspecified: Secondary | ICD-10-CM | POA: Diagnosis not present

## 2020-03-30 DIAGNOSIS — R102 Pelvic and perineal pain: Secondary | ICD-10-CM | POA: Diagnosis not present

## 2020-03-30 DIAGNOSIS — R350 Frequency of micturition: Secondary | ICD-10-CM | POA: Diagnosis not present

## 2020-03-30 DIAGNOSIS — L292 Pruritus vulvae: Secondary | ICD-10-CM | POA: Diagnosis not present

## 2020-03-30 LAB — WET PREP FOR TRICH, YEAST, CLUE

## 2020-03-30 NOTE — Progress Notes (Signed)
PIEPER KASIK [birth year] [id number]  SUBJECTIVE:  74 y.o. G0P0 female presents for evaluation of lower abdominal aching.  Discomfort is bilateral and mostly constant.  No exacerbating or relieving factors.  It is been present in the last 2 months.  No radiation.  She also describes changes to her bowel movements including increased frequency of loose stool and increased urgency to defecate.  No current rectal bleeding, but she thinks she had some earlier last year and brought this to the attention of GI and they had recommended a sigmoidoscopy which ultimately got deferred due to the pandemic so she never had that done.  Last colonoscopy is listed as 2019 per previous notes.  She also has a significant family history of colon cancer and also ovarian cancer.  She had previously discussed prophylactic BSO with her previous GYN provider and ultimately she decided that she wanted to continue with annual pelvic ultrasound and CA 125 surveillance.  No vaginal bleeding.  Also notes some slight urinary frequency.  No dysuria.  Having vaginal itching and also in the perianal area.  Denies using any soaps or wipes on the bottom.   Current Outpatient Medications  Medication Sig Dispense Refill  . acetaminophen (TYLENOL) 325 MG tablet Take 650 mg by mouth every 6 (six) hours as needed for moderate pain.    Marland Kitchen ALPRAZolam (XANAX) 0.25 MG tablet Take 1 tablet (0.25 mg total) by mouth every 8 (eight) hours as needed for anxiety. 30 tablet 0  . B Complex Vitamins (B COMPLEX 1 PO) Take 1 each by mouth 2 (two) times daily. sublingual    . denosumab (PROLIA) 60 MG/ML SOLN injection Inject 60 mg into the skin every 6 (six) months. Administer in upper arm, thigh, or abdomen 180 mL 2  . escitalopram (LEXAPRO) 20 MG tablet Take 20 mg by mouth daily.    . Multiple Vitamins-Minerals (HAIR SKIN AND NAILS FORMULA PO) Take 2 tablets by mouth daily.    . Multiple Vitamins-Minerals (MULTIVITAMIN PO) Take 1 tablet by mouth 2 (two)  times daily.     . Rivaroxaban (XARELTO PO) Take by mouth.    Marland Kitchen rOPINIRole (REQUIP) 1 MG tablet Take 1 tablet (1 mg total) by mouth at bedtime. 60 tablet 2  . budesonide-formoterol (SYMBICORT) 160-4.5 MCG/ACT inhaler Inhale 2 puffs into the lungs every 12 (twelve) hours. (Patient not taking: Reported on 12/04/2019) 1 Inhaler 6  . fluticasone (FLONASE) 50 MCG/ACT nasal spray USE 2 SPRAYS INTO EACH NOSTRIL AS NEEDED ALLERGIES. (Patient not taking: Reported on 03/30/2020)  1   No current facility-administered medications for this visit.   Allergies: Sulfa antibiotics  No LMP recorded. Patient has had a hysterectomy.  Past medical history,surgical history, problem list, medications, allergies, family history and social history were all reviewed and documented as reviewed in the EPIC chart.  ROS: Pertinent positives as discussed in HPI, remainder negative.  GYN ROS: As described in HPI   OBJECTIVE:  BP 118/76  The patient appears well, alert, oriented x 3, in no distress.  PELVIC EXAM: VULVA: normal appearing vulva with atrophy, mild diffuse erythema, no masses, tenderness or lesions, discharge, VAGINA: normal appearing vagina with normal color and discharge, no lesions, pediatric speculum used per patient request, CERVIX: Surgically absent, UTERUS: Surgically absent, ADNEXA: normal adnexa in size, nontender and no masses, WET MOUNT done - results: negative for pathogens, normal epithelial cells Urine culture pending (not enough sample for UA)  Chaperone: [name] present during the examination  ASSESSMENT:  74 y.o. G0P0 here for lower abdominal discomfort, vulvar/perineal pruritus  PLAN:  Last pelvic ultrasound screening was 8 months ago 07/2019.  Negative pelvic ultrasound and normal CA-125 at that time.  Again discussed issues with performing that screening including low sensitivity and no evidence for improved outcomes long-term.  Also discussed option of performing BSO and also the  associated downfalls of that to include surgical risks and incomplete reduction of ovarian cancer risk and possibility of primary peritoneal carcinoma.  I do not think performing the CA-125 and ultrasound screen at this time is necessary and may even result in false positive findings. As she has had lower abdominal symptoms that are vague and bilateral, with associated stooling changes and transient episode of rectal bleeding, I highly recommended that she get evaluated by GI.  Perhaps she should consider asking about getting the sigmoidoscopy that was deferred.  Also will check a urine culture to evaluate if bladder is causing any of those symptoms. For the perineal pruritus, the vaginal wet mount was negative.  Mild erythema noted on the vulvar tissues, so I suggested using 1% hydrocortisone OTC to the area as needed, avoid soaps and scrubbing.  Will follow-up if any ongoing issues.   Joseph Pierini MD 03/30/20

## 2020-03-30 NOTE — Telephone Encounter (Signed)
-----   Message from Joseph Pierini, MD sent at 03/30/2020  1:00 PM EDT ----- Regarding: DEXA scan due at [facility] Patient said she would need a referral for DEXA, thanks JK

## 2020-03-30 NOTE — Telephone Encounter (Signed)
Called patient and left detailed message on home # order has been faxed to [facility].

## 2020-03-31 DIAGNOSIS — F339 Major depressive disorder, recurrent, unspecified: Secondary | ICD-10-CM | POA: Diagnosis not present

## 2020-03-31 DIAGNOSIS — K921 Melena: Secondary | ICD-10-CM | POA: Diagnosis not present

## 2020-03-31 DIAGNOSIS — E559 Vitamin D deficiency, unspecified: Secondary | ICD-10-CM | POA: Diagnosis not present

## 2020-03-31 DIAGNOSIS — Z0000 Encounter for general adult medical examination without abnormal findings: Secondary | ICD-10-CM | POA: Diagnosis not present

## 2020-03-31 DIAGNOSIS — F419 Anxiety disorder, unspecified: Secondary | ICD-10-CM | POA: Diagnosis not present

## 2020-03-31 DIAGNOSIS — E785 Hyperlipidemia, unspecified: Secondary | ICD-10-CM | POA: Diagnosis not present

## 2020-03-31 DIAGNOSIS — Z86718 Personal history of other venous thrombosis and embolism: Secondary | ICD-10-CM | POA: Diagnosis not present

## 2020-03-31 DIAGNOSIS — M810 Age-related osteoporosis without current pathological fracture: Secondary | ICD-10-CM | POA: Diagnosis not present

## 2020-03-31 DIAGNOSIS — R1030 Lower abdominal pain, unspecified: Secondary | ICD-10-CM | POA: Diagnosis not present

## 2020-03-31 DIAGNOSIS — M797 Fibromyalgia: Secondary | ICD-10-CM | POA: Diagnosis not present

## 2020-03-31 LAB — URINE CULTURE
MICRO NUMBER:: 10888113
Result:: NO GROWTH
SPECIMEN QUALITY:: ADEQUATE

## 2020-04-08 DIAGNOSIS — K588 Other irritable bowel syndrome: Secondary | ICD-10-CM | POA: Diagnosis not present

## 2020-04-28 ENCOUNTER — Telehealth: Payer: Self-pay | Admitting: *Deleted

## 2020-04-28 DIAGNOSIS — M810 Age-related osteoporosis without current pathological fracture: Secondary | ICD-10-CM

## 2020-04-28 NOTE — Telephone Encounter (Addendum)
Deductible N/A  OOP MAX [id number])  Annual exam 12/2/20220   Calcium 9.0            Date 05/25/2020  Upcoming dental procedures   Prior Authorization needed NO  Pt estimated Cost $221  appt 05/27/2020     Coverage Details: 20% ONE DOSE, 20% ADMIN FEE

## 2020-05-07 DIAGNOSIS — H401411 Capsular glaucoma with pseudoexfoliation of lens, right eye, mild stage: Secondary | ICD-10-CM | POA: Diagnosis not present

## 2020-05-07 DIAGNOSIS — H2513 Age-related nuclear cataract, bilateral: Secondary | ICD-10-CM | POA: Diagnosis not present

## 2020-05-07 DIAGNOSIS — H5212 Myopia, left eye: Secondary | ICD-10-CM | POA: Diagnosis not present

## 2020-05-08 DIAGNOSIS — M810 Age-related osteoporosis without current pathological fracture: Secondary | ICD-10-CM | POA: Diagnosis not present

## 2020-05-11 ENCOUNTER — Encounter: Payer: Self-pay | Admitting: Gynecology

## 2020-05-13 ENCOUNTER — Other Ambulatory Visit: Payer: PPO

## 2020-05-18 ENCOUNTER — Other Ambulatory Visit: Payer: PPO

## 2020-05-25 ENCOUNTER — Other Ambulatory Visit: Payer: Self-pay

## 2020-05-25 ENCOUNTER — Other Ambulatory Visit: Payer: PPO

## 2020-05-25 DIAGNOSIS — M810 Age-related osteoporosis without current pathological fracture: Secondary | ICD-10-CM | POA: Diagnosis not present

## 2020-05-26 LAB — CALCIUM: Calcium: 9 mg/dL (ref 8.6–10.4)

## 2020-05-27 ENCOUNTER — Ambulatory Visit (INDEPENDENT_AMBULATORY_CARE_PROVIDER_SITE_OTHER): Payer: PPO | Admitting: Gynecology

## 2020-05-27 ENCOUNTER — Other Ambulatory Visit: Payer: Self-pay

## 2020-05-27 DIAGNOSIS — M810 Age-related osteoporosis without current pathological fracture: Secondary | ICD-10-CM | POA: Diagnosis not present

## 2020-05-27 MED ORDER — DENOSUMAB 60 MG/ML ~~LOC~~ SOSY
60.0000 mg | PREFILLED_SYRINGE | Freq: Once | SUBCUTANEOUS | Status: AC
  Administered 2020-05-27: 60 mg via SUBCUTANEOUS

## 2020-05-28 DIAGNOSIS — R202 Paresthesia of skin: Secondary | ICD-10-CM | POA: Diagnosis not present

## 2020-05-28 DIAGNOSIS — I447 Left bundle-branch block, unspecified: Secondary | ICD-10-CM | POA: Diagnosis not present

## 2020-05-28 DIAGNOSIS — Z86711 Personal history of pulmonary embolism: Secondary | ICD-10-CM | POA: Diagnosis not present

## 2020-05-28 DIAGNOSIS — Z7901 Long term (current) use of anticoagulants: Secondary | ICD-10-CM | POA: Diagnosis not present

## 2020-05-28 DIAGNOSIS — R001 Bradycardia, unspecified: Secondary | ICD-10-CM | POA: Diagnosis not present

## 2020-05-28 DIAGNOSIS — R946 Abnormal results of thyroid function studies: Secondary | ICD-10-CM | POA: Diagnosis not present

## 2020-06-01 DIAGNOSIS — R946 Abnormal results of thyroid function studies: Secondary | ICD-10-CM | POA: Diagnosis not present

## 2020-06-02 NOTE — Progress Notes (Signed)
Cardiology Office Note:    Date:  06/03/2020   ID:  Emma Lopez, DOB 21-Feb-1946, MRN [id number]  PCP:  Michael Boston, MD  Cardiologist:  Fransico Him, MD    Referring MD: Geoffery Lyons, NP   Chief Complaint  Patient presents with  . Follow-up    Bradycardia    History of Present Illness:    Emma Lopez is a 74 y.o. female with a hx of DVT with PE, hyperlipidemia and GERD who was seen a few weeks ago by my extender for evaluation of abnormal EKG, dyspnea on exertion and fatigue.  EKG at that time showed sinus bradycardia at 51 bpm but otherwise normal.  2D echocardiogram shows normal LV function with no valvular abnormalities and grade 1 diastolic dysfunction..  Nuclear stress test showed no ischemia.    She is now here today for follow-up.  She tells me that the other day she awakened and her left arm was pins and needles and felt numb.  She also had chest discomfort that she described as a pressure across the front of her chest that lasted a day off and on for a day.  There were no associated sx of nausea or diaphoresis.  Her arms have felt that way for about a day and ever since then her fingers feel numb in both hands.  Another night she awakened in the middle of the night and both arms felt like they were on fire.  She took a xanax and breathed into a bag and went back to sleep. She has panic attacks.  She also has some DOE and has experienced fatigue this year as well.   Past Medical History:  Diagnosis Date  . Abnormal EKG   . Anxiety   . Arthritis    oa  . Bradycardia 12/14/2017  . CIN III (cervical intraepithelial neoplasia III) 2000  . Complication of anesthesia    did well last 2 times with procedures  . Depression   . 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 DVT (deep venous thrombosis) ([place]) 01/2009   LEFT LEG  . Dyspnea on exertion 10/2017  . Elevated triglycerides with high cholesterol   . Endometriosis   . Fibromyalgia   . GERD (gastroesophageal reflux disease)   .  History of colon polyps   . History of hiatal hernia    told by some md has, some say not  . IBS (irritable bowel syndrome) 2015  . Multinodular goiter   . Osteoporosis 12/2017   T score -3.2 stable from prior study  . PE (pulmonary embolism)   . PONV (postoperative nausea and vomiting)   . Restless legs syndrome   . Sleep disorder   . Thyroid disease    HYPERTHYROIDISM  . Vitamin D deficiency     Past Surgical History:  Procedure Laterality Date  . ABDOMINAL HYSTERECTOMY  2001   TAH, partial  . APPENDECTOMY  1978  . COLONOSCOPY WITH PROPOFOL N/A 12/07/2015   Procedure: COLONOSCOPY WITH PROPOFOL;  Surgeon: Garlan Fair, MD;  Location: [facility];  Service: Endoscopy;  Laterality: N/A;  . colonscopy  7 yrs ago   other in past  . ESOPHAGOGASTRODUODENOSCOPY (EGD) WITH PROPOFOL N/A 12/07/2015   Procedure: ESOPHAGOGASTRODUODENOSCOPY (EGD) WITH PROPOFOL;  Surgeon: Garlan Fair, MD;  Location: [facility];  Service: Endoscopy;  Laterality: N/A;  . ESOPHAGOGASTRODUODENOSCOPY ENDOSCOPY  yrs ago  . FACIAL COSMETIC SURGERY    . GYNECOLOGIC CRYOSURGERY    . MYOMECTOMY    .  TONSILLECTOMY      Current Medications: Current Meds  Medication Sig  . acetaminophen (TYLENOL) 325 MG tablet Take 650 mg by mouth every 6 (six) hours as needed for moderate pain.  Marland Kitchen ALPRAZolam (XANAX) 0.25 MG tablet Take 1 tablet (0.25 mg total) by mouth every 8 (eight) hours as needed for anxiety.  . B Complex Vitamins (B COMPLEX 1 PO) Take 1 each by mouth 2 (two) times daily. sublingual  . denosumab (PROLIA) 60 MG/ML SOLN injection Inject 60 mg into the skin every 6 (six) months. Administer in upper arm, thigh, or abdomen  . escitalopram (LEXAPRO) 20 MG tablet Take 20 mg by mouth daily.  . fluticasone (FLONASE) 50 MCG/ACT nasal spray USE 2 SPRAYS INTO EACH NOSTRIL AS NEEDED ALLERGIES.  . Multiple Vitamins-Minerals (MULTIVITAMIN PO) Take 1 tablet by mouth 2 (two) times daily.   . Rivaroxaban (XARELTO PO)  Take 10 mg by mouth.   Marland Kitchen rOPINIRole (REQUIP) 1 MG tablet Take 1 tablet (1 mg total) by mouth at bedtime.     Allergies:   Sulfa antibiotics   Social History   Socioeconomic History  . Marital status: Single    Spouse name: Not on file  . Number of children: Not on file  . Years of education: Not on file  . Highest education level: Not on file  Occupational History  . Not on file  Tobacco Use  . Smoking status: Former Smoker    Packs/day: 0.10    Years: 6.00    Pack years: 0.60    Types: Cigarettes    Quit date: 08/01/1978    Years since quitting: 41.8  . Smokeless tobacco: Never Used  . Tobacco comment: only smoked 4-6 yrs 1 pack per month  Vaping Use  . Vaping Use: Never used  Substance and Sexual Activity  . Alcohol use: Yes    Alcohol/week: 0.0 standard drinks    Comment: very rare  . Drug use: No  . Sexual activity: Not Currently    Birth control/protection: Surgical    Comment: HYSTERECTOMY-1st intercourse 27-Fewer than 5 partners  Other Topics Concern  . Not on file  Social History Narrative  . Not on file   Social Determinants of Health   Financial Resource Strain:   . Difficulty of Paying Living Expenses: Not on file  Food Insecurity:   . Worried About Charity fundraiser in the Last Year: Not on file  . Ran Out of Food in the Last Year: Not on file  Transportation Needs:   . Lack of Transportation (Medical): Not on file  . Lack of Transportation (Non-Medical): Not on file  Physical Activity:   . Days of Exercise per Week: Not on file  . Minutes of Exercise per Session: Not on file  Stress:   . Feeling of Stress : Not on file  Social Connections:   . Frequency of Communication with Friends and Family: Not on file  . Frequency of Social Gatherings with Friends and Family: Not on file  . Attends Religious Services: Not on file  . Active Member of Clubs or Organizations: Not on file  . Attends Archivist Meetings: Not on file  . Marital  Status: Not on file     Family History: The patient's family history includes Alzheimer's disease in her maternal aunt and maternal aunt; Breast cancer in her maternal aunt, maternal aunt, and mother; Cancer in her father, maternal aunt, maternal aunt, and paternal aunt; Hypertension in her mother and  sister; Stroke in her sister.  ROS:   Please see the history of present illness.    ROS  All other systems reviewed and negative.   EKGs/Labs/Other Studies Reviewed:    The following studies were reviewed today: 2D echo and nuclear stress test  EKG:  EKG is ordered today and showed NSR at 60bpm with PACs and LBBB  Recent Labs: No results found for requested labs within last 8760 hours.   Recent Lipid Panel No results found for: CHOL, TRIG, HDL, CHOLHDL, VLDL, LDLCALC, LDLDIRECT  Physical Exam:    VS:  BP 124/68   Pulse 60   Ht 5\' 2"  (1.575 m)   Wt 132 lb (59.9 kg)   BMI 24.14 kg/m     Wt Readings from Last 3 Encounters:  06/03/20 132 lb (59.9 kg)  12/04/19 133 lb (60.3 kg)  07/03/19 136 lb (61.7 kg)     GEN:  Well nourished, well developed in no acute distress HEENT: Normal NECK: No JVD; No carotid bruits LYMPHATICS: No lymphadenopathy CARDIAC: RRR, no murmurs, rubs, gallops RESPIRATORY:  Clear to auscultation without rales, wheezing or rhonchi  ABDOMEN: Soft, non-tender, non-distended MUSCULOSKELETAL:  No edema; No deformity  SKIN: Warm and dry NEUROLOGIC:  Alert and oriented x 3 PSYCHIATRIC:  Normal affect   ASSESSMENT:    1. DOE (dyspnea on exertion)   2. Bradycardia   3. Chest pain, unspecified type   4. Bilateral arm numbness and tingling while sleeping    PLAN:    In order of problems listed above:  1.  Dyspnea on exertion -coronary CTA 12/2017 with no CAD and Ca score of 0 -2D echo normal with G1DD -evaluated by Pulmonary and felt bradycardia could have been contributing and possibly had some mild asthma -may be due to deconditioning -will refer  back to pulmonary  2.  Bradycardia  -She has a hx of this in the past and is not on any rate slowing meds -HR on event monitor in 2019 ranged from 50-80bpm with PAC, PVCs and nonsustained atrial tach -HR today is 60bpm -repeat 3 day ziopatch to make sure that she does not have any significant bradycardia that could be causing her fatigue  3.  Chest pain -coronary CTA 2 years ago showed no ischemia -symptoms are very atypical -EKG similar but QRS wider than prior EKG -she has fibromyalgia and has had worsening fatigue recently so it could be related to fibromyalgia -will get nuclear stress test to rule out ischemia but very unlikely  4.  Numbness and tingling in arms -suspect that this is neurologic>>she has DJD in her cervical spine -recommend followup with PCP  Medication Adjustments/Labs and Tests Ordered: Current medicines are reviewed at length with the patient today.  Concerns regarding medicines are outlined above.  Orders Placed This Encounter  Procedures  . EKG 12-Lead   No orders of the defined types were placed in this encounter.   Signed, Fransico Him, MD  06/03/2020 9:12 AM    [place]

## 2020-06-03 ENCOUNTER — Ambulatory Visit (INDEPENDENT_AMBULATORY_CARE_PROVIDER_SITE_OTHER): Payer: PPO | Admitting: Cardiology

## 2020-06-03 ENCOUNTER — Encounter: Payer: Self-pay | Admitting: Cardiology

## 2020-06-03 ENCOUNTER — Encounter: Payer: Self-pay | Admitting: *Deleted

## 2020-06-03 ENCOUNTER — Other Ambulatory Visit: Payer: Self-pay

## 2020-06-03 VITALS — BP 124/68 | HR 60 | Ht 62.0 in | Wt 132.0 lb

## 2020-06-03 DIAGNOSIS — R072 Precordial pain: Secondary | ICD-10-CM | POA: Diagnosis not present

## 2020-06-03 DIAGNOSIS — R079 Chest pain, unspecified: Secondary | ICD-10-CM

## 2020-06-03 DIAGNOSIS — R202 Paresthesia of skin: Secondary | ICD-10-CM

## 2020-06-03 DIAGNOSIS — R001 Bradycardia, unspecified: Secondary | ICD-10-CM

## 2020-06-03 DIAGNOSIS — R0600 Dyspnea, unspecified: Secondary | ICD-10-CM

## 2020-06-03 DIAGNOSIS — R200 Anesthesia of skin: Secondary | ICD-10-CM | POA: Diagnosis not present

## 2020-06-03 DIAGNOSIS — R0609 Other forms of dyspnea: Secondary | ICD-10-CM

## 2020-06-03 NOTE — Patient Instructions (Addendum)
Medication Instructions:  Your physician recommends that you continue on your current medications as directed. Please refer to the Current Medication list given to you today.  *If you need a refill on your cardiac medications before your next appointment, please call your pharmacy*  Testing/Procedures: Your physician has recommended that you wear an event monitor. Event monitors are medical devices that record the heart's electrical activity. Doctors most often [place] these monitors to diagnose arrhythmias. Arrhythmias are problems with the speed or rhythm of the heartbeat. The monitor is a small, portable device. You can wear one while you do your normal daily activities. This is usually used to diagnose what is causing palpitations/syncope (passing out).  Your physician has requested that you have a lexiscan myoview. For further information please visit [email or web address]. Please follow instruction sheet, as given.  Follow-Up: At [facility], you and your health needs are our priority.  As part of our continuing mission to provide you with exceptional heart care, we have created designated Provider Care Teams.  These Care Teams include your primary Cardiologist (physician) and Advanced Practice Providers (APPs -  Physician Assistants and Nurse Practitioners) who all work together to provide you with the care you need, when you need it.  Your next appointment:   1 year(s)  The format for your next appointment:   In Person  Provider:   You may see Fransico Him, MD or one of the following Advanced Practice Providers on your designated Care Team:    Melina Copa, PA-C  Ermalinda Barrios, PA-C  You have been referred back to see pulmonology.   Other Instructions ZIO XT- Long Term Monitor Instructions   Your physician has requested you wear your ZIO patch monitor_3_days.   This is a single patch monitor.  [employer] supplies one patch monitor per enrollment.  Additional stickers are not  available.   Please do not apply patch if you will be having a Nuclear Stress Test, Echocardiogram, Cardiac CT, MRI, or Chest Xray during the time frame you would be wearing the monitor. The patch cannot be worn during these tests.  You cannot remove and re-apply the ZIO XT patch monitor.   Your ZIO patch monitor will be sent USPS Priority mail from [facility] directly to your home address. The monitor may also be mailed to a PO BOX if home delivery is not available.   It may take 3-5 days to receive your monitor after you have been enrolled.   Once you have received you monitor, please review enclosed instructions.  Your monitor has already been registered assigning a specific monitor serial # to you.   Applying the monitor   Shave hair from upper left chest.   Hold abrader disc by orange tab.  Rub abrader in 40 strokes over left upper chest as indicated in your monitor instructions.   Clean area with 4 enclosed alcohol pads .  Use all pads to assure are is cleaned thoroughly.  Let dry.   Apply patch as indicated in monitor instructions.  Patch will be place under collarbone on left side of chest with arrow pointing upward.   Rub patch adhesive wings for 2 minutes.Remove white label marked "1".  Remove white label marked "2".  Rub patch adhesive wings for 2 additional minutes.   While looking in a mirror, press and release button in center of patch.  A small green light will flash 3-4 times .  This will be your only indicator the monitor has been turned on.  Do not shower for the first 24 hours.  You may shower after the first 24 hours.   Press button if you feel a symptom. You will hear a small click.  Record Date, Time and Symptom in the Patient Log Book.   When you are ready to remove patch, follow instructions on last 2 pages of Patient Log Book.  Stick patch monitor onto last page of Patient Log Book.   Place Patient Log Book in [place] box.  Use locking tab on box and  tape box closed securely.  The [employer] has [employer] on it.  Please place in mailbox as soon as possible.  Your physician should have your test results approximately 7 days after the monitor has been mailed back to [facility].   Call [place] at [phone] if you have questions regarding your ZIO XT patch monitor.  Call them immediately if you see an orange light blinking on your monitor.   If your monitor falls off in less than 4 days contact our Monitor department at [phone].  If your monitor becomes loose or falls off after 4 days call [employer] at [phone] for suggestions on securing your monitor.

## 2020-06-03 NOTE — Addendum Note (Signed)
Addended by: Antonieta Iba on: 06/03/2020 09:20 AM   Modules accepted: Orders

## 2020-06-03 NOTE — Progress Notes (Signed)
Patient ID: Emma Lopez, female   DOB: November 28, 1945, 74 y.o.   MRN: [id number] Patient enrolled for [employer] to ship a 3 day ZIO XT long term holter monitor to her home.

## 2020-06-04 DIAGNOSIS — R200 Anesthesia of skin: Secondary | ICD-10-CM | POA: Diagnosis not present

## 2020-06-04 DIAGNOSIS — E042 Nontoxic multinodular goiter: Secondary | ICD-10-CM | POA: Diagnosis not present

## 2020-06-04 DIAGNOSIS — Z7689 Persons encountering health services in other specified circumstances: Secondary | ICD-10-CM | POA: Diagnosis not present

## 2020-06-04 DIAGNOSIS — E079 Disorder of thyroid, unspecified: Secondary | ICD-10-CM | POA: Diagnosis not present

## 2020-06-08 ENCOUNTER — Telehealth (HOSPITAL_COMMUNITY): Payer: Self-pay | Admitting: Cardiology

## 2020-06-08 NOTE — Telephone Encounter (Signed)
Patient cancelled Myoview due to she is going out of town and she will callus back to reschedule per Amado Nash. Order will be removed from the [facility] and when patient calls back to reschedule we will reinstate the order.

## 2020-06-09 ENCOUNTER — Ambulatory Visit (INDEPENDENT_AMBULATORY_CARE_PROVIDER_SITE_OTHER): Payer: PPO

## 2020-06-09 DIAGNOSIS — R001 Bradycardia, unspecified: Secondary | ICD-10-CM | POA: Diagnosis not present

## 2020-06-12 ENCOUNTER — Telehealth: Payer: Self-pay | Admitting: Cardiology

## 2020-06-12 ENCOUNTER — Encounter (HOSPITAL_COMMUNITY): Payer: PPO

## 2020-06-12 DIAGNOSIS — R079 Chest pain, unspecified: Secondary | ICD-10-CM | POA: Diagnosis not present

## 2020-06-12 DIAGNOSIS — Z20822 Contact with and (suspected) exposure to covid-19: Secondary | ICD-10-CM | POA: Diagnosis not present

## 2020-06-12 DIAGNOSIS — R0789 Other chest pain: Secondary | ICD-10-CM | POA: Diagnosis not present

## 2020-06-12 DIAGNOSIS — R001 Bradycardia, unspecified: Secondary | ICD-10-CM | POA: Diagnosis not present

## 2020-06-12 DIAGNOSIS — R531 Weakness: Secondary | ICD-10-CM | POA: Diagnosis not present

## 2020-06-12 NOTE — Telephone Encounter (Signed)
Yesterday evening she started experiencing the feeling of really cold and then hot in her arms and torso. Tingling in both arms.   She is wearing Zio monitor for 3 days which is currently in place.    She has a nerve conduction testing to be scheduled.   She went to the ED in [place]. [facility] with the above symptoms and chest pain. She is currently not experiencing chest pain at this time.   Fax number was given to patient to fax the paperwork that she had from that visit at the ED 11/12.    Made appointment with Dr. Radford Pax on 06/17/20.   Will forward for further advisement if needed.

## 2020-06-12 NOTE — Telephone Encounter (Signed)
Pt c/o of Chest Pain: STAT if CP now or developed within 24 hours  1. Are you having CP right now? no  2. Are you experiencing any other symptoms (ex. SOB, nausea, vomiting, sweating)? sweating  3. How long have you been experiencing CP? Since last night   4. Is your CP continuous or coming and going?   5. Have you taken Nitroglycerin? Yes.  Pt is in [place] and had some symptoms. She went to the ER by EMS yesterday. She was told to follow-up with Dr. Radford Pax  ?

## 2020-06-14 NOTE — Telephone Encounter (Signed)
Needs stress test first

## 2020-06-15 ENCOUNTER — Telehealth: Payer: Self-pay | Admitting: Cardiology

## 2020-06-15 NOTE — Telephone Encounter (Signed)
Patient called and stated that she went to the doctor in [place] and they ran some tests on her and she said that she faxed them over to Dr. Radford Pax on Saturday. Wants to know if Dr. Radford Pax received them and if Dr. Radford Pax still wants to see her this upcoming week? Please call back

## 2020-06-16 NOTE — Telephone Encounter (Signed)
Patient had stress echocardiogram done at [facility]. Results reviewed by Dr. Radford Pax.  Patient will keep appointment tomorrow to discuss symptoms and further testing if needed.

## 2020-06-17 ENCOUNTER — Other Ambulatory Visit: Payer: Self-pay

## 2020-06-17 ENCOUNTER — Ambulatory Visit: Payer: PPO | Admitting: Cardiology

## 2020-06-17 ENCOUNTER — Encounter: Payer: Self-pay | Admitting: Cardiology

## 2020-06-17 VITALS — BP 126/70 | HR 50 | Ht 62.0 in | Wt 134.8 lb

## 2020-06-17 DIAGNOSIS — R0600 Dyspnea, unspecified: Secondary | ICD-10-CM | POA: Diagnosis not present

## 2020-06-17 DIAGNOSIS — R079 Chest pain, unspecified: Secondary | ICD-10-CM | POA: Diagnosis not present

## 2020-06-17 DIAGNOSIS — R200 Anesthesia of skin: Secondary | ICD-10-CM | POA: Diagnosis not present

## 2020-06-17 DIAGNOSIS — R001 Bradycardia, unspecified: Secondary | ICD-10-CM | POA: Diagnosis not present

## 2020-06-17 DIAGNOSIS — R202 Paresthesia of skin: Secondary | ICD-10-CM

## 2020-06-17 DIAGNOSIS — R0609 Other forms of dyspnea: Secondary | ICD-10-CM

## 2020-06-17 NOTE — Patient Instructions (Signed)
Medication Instructions:  Your physician recommends that you continue on your current medications as directed. Please refer to the Current Medication list given to you today.  *If you need a refill on your cardiac medications before your next appointment, please call your pharmacy*  Follow-Up: At [facility], you and your health needs are our priority.  As part of our continuing mission to provide you with exceptional heart care, we have created designated Provider Care Teams.  These Care Teams include your primary Cardiologist (physician) and Advanced Practice Providers (APPs -  Physician Assistants and Nurse Practitioners) who all work together to provide you with the care you need, when you need it.  Follow up with Dr. Radford Pax as needed based on results of heart monitor

## 2020-06-17 NOTE — Progress Notes (Signed)
Cardiology Office Note:    Date:  06/17/2020   ID:  Emma Lopez, DOB Nov 01, 1945, MRN [id number]  PCP:  Michael Boston, MD  Cardiologist:  Fransico Him, MD    Referring MD: Michael Boston, MD   Chief Complaint  Patient presents with  . Follow-up    DOE, bradycardia, chest pain     History of Present Illness:    Emma Lopez is a 74 y.o. female with a hx of DVT with PE, hyperlipidemia, bradycardia and GERD.  2D echo 11/2017 showed normal LV function with no valvular abnormalities and grade 1 diastolic dysfunction. Nuclear stress test showed no ischemia in 11/2017. Coronary CTA 2019 showed a Ca score of 0 and no CAD.  She was evaluated by Pulmonary for her SOB and they felt it could be related to bradycardia and also that she had mild asthma and deconditioning.   She is here today for followup of an OV on 11/3 when she had a ziopatch ordered but has not had it done.  She tells me [place] phone she felt a warm burning sensation above her torso.  She felt some mild pressure in her upper chest.  She says that she will get this burning sensation during the night and she will get the burning in her arms as well.  She went to the ER and ruled out for MI.  Stress echo was normal with no ischemia.  She has not had any problems with symptoms any further except the burning in her finger tips and her finger tips are numb.   She has chronic fatigue which has been felt to be related to fibromyalgia.  She denies any chest pain or pressure, PND, orthopnea, LE edema, dizziness, palpitations or syncope. She has chronic mild DOE, RLS, fibromyalgia and anxiety that all contribute to her sx.  She is compliant with her meds and is tolerating meds with no SE.    Past Medical History:  Diagnosis Date  . Abnormal EKG   . Anxiety   . Arthritis    oa  . Bradycardia 12/14/2017  . CIN III (cervical intraepithelial neoplasia III) 2000  . Complication of  anesthesia    did well last 2 times with procedures  . Depression   . 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 DVT (deep venous thrombosis) ([place]) 01/2009   LEFT LEG  . Dyspnea on exertion 10/2017  . Elevated triglycerides with high cholesterol   . Endometriosis   . Fibromyalgia   . GERD (gastroesophageal reflux disease)   . History of colon polyps   . History of hiatal hernia    told by some md has, some say not  . IBS (irritable bowel syndrome) 2015  . Multinodular goiter   . Osteoporosis 12/2017   T score -3.2 stable from prior study  . PE (pulmonary embolism)   . PONV (postoperative nausea and vomiting)   . Restless legs syndrome   . Sleep disorder   . Thyroid disease    HYPERTHYROIDISM  . Vitamin D deficiency     Past Surgical History:  Procedure Laterality Date  . ABDOMINAL HYSTERECTOMY  2001   TAH, partial  . APPENDECTOMY  1978  . COLONOSCOPY WITH PROPOFOL N/A 12/07/2015   Procedure: COLONOSCOPY WITH PROPOFOL;  Surgeon: Garlan Fair, MD;  Location: [facility] END[facility];  Service: Endoscopy;  Laterality: N/A;  . colonscopy  7 yrs ago  other in past  . ESOPHAGOGASTRODUODENOSCOPY (EGD) WITH PROPOFOL N/A 12/07/2015   Procedure: ESOPHAGOGASTRODUODENOSCOPY (EGD) WITH PROPOFOL;  Surgeon: Garlan Fair, MD;  Location: [facility];  Service: Endoscopy;  Laterality: N/A;  . ESOPHAGOGASTRODUODENOSCOPY ENDOSCOPY  yrs ago  . FACIAL COSMETIC SURGERY    . GYNECOLOGIC CRYOSURGERY    . MYOMECTOMY    . TONSILLECTOMY      Current Medications: Current Meds  Medication Sig  . acetaminophen (TYLENOL) 325 MG tablet Take 650 mg by mouth every 6 (six) hours as needed for moderate pain.  Marland Kitchen ALPRAZolam (XANAX) 0.25 MG tablet Take 1 tablet (0.25 mg total) by mouth every 8 (eight) hours as needed for anxiety.  . B Complex Vitamins (B COMPLEX 1 PO) Take 1 each by mouth 2 (two) times daily. sublingual  . budesonide-formoterol (SYMBICORT) 160-4.5 MCG/ACT inhaler Inhale 2 puffs into the lungs every 12 (twelve)  hours.  Marland Kitchen denosumab (PROLIA) 60 MG/ML SOLN injection Inject 60 mg into the skin every 6 (six) months. Administer in upper arm, thigh, or abdomen  . escitalopram (LEXAPRO) 20 MG tablet Take 20 mg by mouth daily.  . fluticasone (FLONASE) 50 MCG/ACT nasal spray USE 2 SPRAYS INTO EACH NOSTRIL AS NEEDED ALLERGIES.  . Multiple Vitamins-Minerals (MULTIVITAMIN PO) Take 1 tablet by mouth 2 (two) times daily.   Marland Kitchen rOPINIRole (REQUIP) 1 MG tablet Take 1 tablet (1 mg total) by mouth at bedtime.  Alveda Reasons 10 MG TABS tablet Take 10 mg by mouth daily.     Allergies:   Sulfa antibiotics   Social History   Socioeconomic History  . Marital status: Single    Spouse name: Not on file  . Number of children: Not on file  . Years of education: Not on file  . Highest education level: Not on file  Occupational History  . Not on file  Tobacco Use  . Smoking status: Former Smoker    Packs/day: 0.10    Years: 6.00    Pack years: 0.60    Types: Cigarettes    Quit date: 08/01/1978    Years since quitting: 41.9  . Smokeless tobacco: Never Used  . Tobacco comment: only smoked 4-6 yrs 1 pack per month  Vaping Use  . Vaping Use: Never used  Substance and Sexual Activity  . Alcohol use: Yes    Alcohol/week: 0.0 standard drinks    Comment: very rare  . Drug use: No  . Sexual activity: Not Currently    Birth control/protection: Surgical    Comment: HYSTERECTOMY-1st intercourse 27-Fewer than 5 partners  Other Topics Concern  . Not on file  Social History Narrative  . Not on file   Social Determinants of Health   Financial Resource Strain:   . Difficulty of Paying Living Expenses: Not on file  Food Insecurity:   . Worried About Charity fundraiser in the Last Year: Not on file  . Ran Out of Food in the Last Year: Not on file  Transportation Needs:   . Lack of Transportation (Medical): Not on file  . Lack of Transportation (Non-Medical): Not on file  Physical Activity:   . Days of Exercise per Week:  Not on file  . Minutes of Exercise per Session: Not on file  Stress:   . Feeling of Stress : Not on file  Social Connections:   . Frequency of Communication with Friends and Family: Not on file  . Frequency of Social Gatherings with Friends and Family: Not on file  .  Attends Religious Services: Not on file  . Active Member of Clubs or Organizations: Not on file  . Attends Archivist Meetings: Not on file  . Marital Status: Not on file     Family History: The patient's family history includes Alzheimer's disease in her maternal aunt and maternal aunt; Breast cancer in her maternal aunt, maternal aunt, and mother; Cancer in her father, maternal aunt, maternal aunt, and paternal aunt; Hypertension in her mother and sister; Stroke in her sister.  ROS:   Please see the history of present illness.    ROS  All other systems reviewed and negative.   EKGs/Labs/Other Studies Reviewed:    The following studies were reviewed today: 2D echo and nuclear stress test  EKG:  EKG is ordered today and showed NSR at 60bpm with PACs and LBBB  Recent Labs: No results found for requested labs within last 8760 hours.   Recent Lipid Panel No results found for: CHOL, TRIG, HDL, CHOLHDL, VLDL, LDLCALC, LDLDIRECT  Physical Exam:    VS:  BP 126/70   Pulse (!) 50   Ht 5\' 2"  (1.575 m)   Wt 134 lb 12.8 oz (61.1 kg)   SpO2 93%   BMI 24.66 kg/m     Wt Readings from Last 3 Encounters:  06/17/20 134 lb 12.8 oz (61.1 kg)  06/03/20 132 lb (59.9 kg)  12/04/19 133 lb (60.3 kg)     GEN: Well nourished, well developed in no acute distress HEENT: Normal NECK: No JVD; No carotid bruits LYMPHATICS: No lymphadenopathy CARDIAC:RRR, no murmurs, rubs, gallops RESPIRATORY:  Clear to auscultation without rales, wheezing or rhonchi  ABDOMEN: Soft, non-tender, non-distended MUSCULOSKELETAL:  No edema; No deformity  SKIN: Warm and dry NEUROLOGIC:  Alert and oriented x 3 PSYCHIATRIC:  Normal affect     ASSESSMENT:    1. DOE (dyspnea on exertion)   2. Bradycardia   3. Chest pain, unspecified type   4. Bilateral arm numbness and tingling while sleeping    PLAN:    In order of problems listed above:  1.  Dyspnea on exertion -coronary CTA 12/2017 with no CAD and Ca score of 0 -2D echo normal with G1DD -recent stress echo with no ischemia -evaluated by Pulmonary and felt bradycardia could have been contributing and possibly had some mild asthma -ziopatch ordered and she has completed it but no results are back yet -will Ziopatch is normal then will get a CPXT -I think her sx are mainly related to fibromyalgia and anxiety  2.  Bradycardia  -She has a hx of this in the past and is not on any rate slowing meds -HR on event monitor in 2019 ranged from 50-80bpm with PAC, PVCs and nonsustained atrial tach -repeat 3 day ziopatch to make sure that she does not have any significant bradycardia that could be causing her fatigue is pending  3.  Chest pain -coronary CTA 2 years ago showed no ischemia -symptoms are very atypical -Recent stress echo showed no ischemia -she has fibromyalgia and has had worsening fatigue recently so it could be related to fibromyalgia  4.  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  Concerns regarding medicines are outlined above.  No orders of the defined types were placed in this encounter.  No orders of the defined types  were placed in this encounter.   Signed, Fransico Him, MD  06/17/2020 1:31 PM    [place]

## 2020-06-22 DIAGNOSIS — R001 Bradycardia, unspecified: Secondary | ICD-10-CM | POA: Diagnosis not present

## 2020-07-03 DIAGNOSIS — R200 Anesthesia of skin: Secondary | ICD-10-CM | POA: Diagnosis not present

## 2020-07-07 ENCOUNTER — Telehealth: Payer: Self-pay

## 2020-07-07 DIAGNOSIS — R001 Bradycardia, unspecified: Secondary | ICD-10-CM

## 2020-07-07 NOTE — Telephone Encounter (Signed)
-----   Message from Sueanne Margarita, MD sent at 06/30/2020  4:42 PM EST ----- This is a stress test only to look at heart rate response to exercise.  LBBB is only an issue when looking for ischemia as EKG is already nondiagnostic but LBBB does not affect HR which is what we are looking at  [place].  ----- Message ----- From: Antonieta Iba, RN Sent: 06/30/2020   3:58 PM EST To: Sueanne Margarita, MD  The patient has been notified of the result and verbalized understanding.  All questions (if any) were answered. Antonieta Iba, RN 06/30/2020 3:54 PM   Patient states that she has been told before that she can not do exercise stress tests.  Hx of LBBB

## 2020-07-07 NOTE — Addendum Note (Signed)
Addended by: Antonieta Iba on: 07/07/2020 12:32 PM   Modules accepted: Orders

## 2020-07-07 NOTE — Telephone Encounter (Signed)
Spoke with the patient and advised her that ETT will be to look at her heart rate response to exercise rather than EKG. Patient verbalized understanding.

## 2020-07-08 ENCOUNTER — Institutional Professional Consult (permissible substitution): Payer: PPO | Admitting: Pulmonary Disease

## 2020-07-08 DIAGNOSIS — R200 Anesthesia of skin: Secondary | ICD-10-CM | POA: Diagnosis not present

## 2020-08-04 ENCOUNTER — Ambulatory Visit (INDEPENDENT_AMBULATORY_CARE_PROVIDER_SITE_OTHER): Payer: PPO | Admitting: Obstetrics and Gynecology

## 2020-08-04 ENCOUNTER — Encounter: Payer: Self-pay | Admitting: Obstetrics and Gynecology

## 2020-08-04 ENCOUNTER — Other Ambulatory Visit: Payer: Self-pay

## 2020-08-04 VITALS — BP 140/80 | Ht 62.0 in | Wt 135.0 lb

## 2020-08-04 DIAGNOSIS — Z803 Family history of malignant neoplasm of breast: Secondary | ICD-10-CM

## 2020-08-04 DIAGNOSIS — Z01419 Encounter for gynecological examination (general) (routine) without abnormal findings: Secondary | ICD-10-CM

## 2020-08-04 DIAGNOSIS — N898 Other specified noninflammatory disorders of vagina: Secondary | ICD-10-CM | POA: Diagnosis not present

## 2020-08-04 DIAGNOSIS — Z9189 Other specified personal risk factors, not elsewhere classified: Secondary | ICD-10-CM | POA: Diagnosis not present

## 2020-08-04 DIAGNOSIS — Z8041 Family history of malignant neoplasm of ovary: Secondary | ICD-10-CM

## 2020-08-04 DIAGNOSIS — Z1272 Encounter for screening for malignant neoplasm of vagina: Secondary | ICD-10-CM | POA: Diagnosis not present

## 2020-08-04 DIAGNOSIS — R300 Dysuria: Secondary | ICD-10-CM

## 2020-08-04 LAB — WET PREP FOR TRICH, YEAST, CLUE

## 2020-08-04 NOTE — Progress Notes (Signed)
Emma Lopez 1946/02/05 [id number]  SUBJECTIVE:  75 y.o. G0P0 female here for a breast and pelvic exam and Pap smear. She had been seen 03/2020 for lower abdominal pain which was thought to be of GI origin.  She did see her GI specialist and was diagnosed with IBS.  Symptoms have mostly improved when following recommended diet and treatment. Vaginal discharge and irritation with dysuria symptoms.  She has no gynecologic concerns.  Current Outpatient Medications  Medication Sig Dispense Refill  . acetaminophen (TYLENOL) 325 MG tablet Take 650 mg by mouth every 6 (six) hours as needed for moderate pain.    Marland Kitchen ALPRAZolam (XANAX) 0.25 MG tablet Take 1 tablet (0.25 mg total) by mouth every 8 (eight) hours as needed for anxiety. 30 tablet 0  . B Complex Vitamins (B COMPLEX 1 PO) Take 1 each by mouth 2 (two) times daily. sublingual    . budesonide-formoterol (SYMBICORT) 160-4.5 MCG/ACT inhaler Inhale 2 puffs into the lungs every 12 (twelve) hours. 1 Inhaler 6  . denosumab (PROLIA) 60 MG/ML SOLN injection Inject 60 mg into the skin every 6 (six) months. Administer in upper arm, thigh, or abdomen 180 mL 2  . escitalopram (LEXAPRO) 20 MG tablet Take 20 mg by mouth daily.    . fluticasone (FLONASE) 50 MCG/ACT nasal spray USE 2 SPRAYS INTO EACH NOSTRIL AS NEEDED ALLERGIES.  1  . Multiple Vitamins-Minerals (MULTIVITAMIN PO) Take 1 tablet by mouth 2 (two) times daily.     Marland Kitchen rOPINIRole (REQUIP) 1 MG tablet Take 1 tablet (1 mg total) by mouth at bedtime. 60 tablet 2  . XARELTO 10 MG TABS tablet Take 10 mg by mouth daily.     No current facility-administered medications for this visit.   Allergies: Sulfa antibiotics  No LMP recorded. Patient has had a hysterectomy.  Past medical history,surgical history, problem list, medications, allergies, family history and social history were all reviewed and documented as reviewed in the EPIC chart.  GYN ROS: no abnormal bleeding, pelvic pain or discharge, no breast  pain or new or enlarging lumps on self exam.  No dysuria, frequency, burning, pain with urination, cloudy/malodorous urine.   OBJECTIVE:  BP 140/80 (BP Location: Right Arm, Patient Position: Sitting, Cuff Size: Normal)   Ht 5\' 2"  (1.575 m)   Wt 135 lb (61.2 kg)   BMI 24.69 kg/m  The patient appears well, alert, oriented, in no distress.  BREAST EXAM: breasts appear normal, no suspicious masses, no skin or nipple changes or axillary nodes  PELVIC EXAM: VULVA: normal appearing vulva with no masses, tenderness or lesions, VAGINA: normal appearing vagina with normal color and discharge, no lesions, CERVIX: Pediatric speculum utilized, surgically absent, UTERUS: surgically absent, vaginal cuff normal, ADNEXA: no masses, nontender, PAP: Pap smear done today, thin-prep method, WET MOUNT done - results: negative for pathogens, normal epithelial cells  Chaperone: Bari Mantis Bonham present during the examination  ASSESSMENT:  75 y.o. G0P0 here for a breast and pelvic exam  PLAN:   1. Postmenopausal. Prior TAH for CIN 3.  No significant menopausal vasomotor symptoms.  She is experiencing vaginal dryness and irritation.  Vaginal wet mount negative.  Discussed menopausal vaginal discharge from estrogen deficiency is likely being the cause for her discharge symptoms.  Will collect urine sample for urinalysis.  We discussed recommendation for trying a vaginal moisturizer such as Replens twice weekly.  She had previously been on vaginal estrogen but stopped when she was no longer sexually active. 2.  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  Vaginal cuff Pap smear collected today. 3.  History of breast cancer and ovarian cancer in family.  Expanded panel genetic testing reportedly negative.  Prophylactic bilateral salpingo-oophorectomy versus surveillance with ultrasound and CA 125 has been discussed with her in the past.  Risks of missed pathology discussed along with genetic testing indicating no guarantee of not  missing pathology.  She would like to continue with annual ultrasound screening and will plan to schedule.  Will go to lab to get CA-125. 4. Mammogram 12/2019.  Normal breast exam today.  Continue with annual mammograms. 5. Colonoscopy 2019.  She will follow up at the interval recommended by her GI specialist.  6.  Osteoporosis.  DEXA 05/2020.  T score -3.3, indicating decline in BMD in the femur.  Continues on Prolia.  Will try to be more consistent with taking vitamin D which she admits she has not been doing.  Next DEXA recommended at the 1 year interval for close follow-up, which she will plan to do this coming fall. 7. Health maintenance.  Will go to lab to get CA-125 as above.  No routine labs today as she normally has these completed with her primary care elsewhere.  Return annually or sooner, prn.  Theresia Majors MD 08/04/20

## 2020-08-05 LAB — PAP IG W/ RFLX HPV ASCU

## 2020-08-05 LAB — CA 125: CA 125: 4 U/mL (ref <35)

## 2020-08-06 ENCOUNTER — Other Ambulatory Visit: Payer: Self-pay

## 2020-08-06 ENCOUNTER — Ambulatory Visit (INDEPENDENT_AMBULATORY_CARE_PROVIDER_SITE_OTHER): Payer: PPO

## 2020-08-06 ENCOUNTER — Encounter: Payer: Self-pay | Admitting: Obstetrics and Gynecology

## 2020-08-06 ENCOUNTER — Ambulatory Visit: Payer: PPO | Admitting: Obstetrics and Gynecology

## 2020-08-06 VITALS — BP 126/80

## 2020-08-06 DIAGNOSIS — Z803 Family history of malignant neoplasm of breast: Secondary | ICD-10-CM

## 2020-08-06 DIAGNOSIS — Z8041 Family history of malignant neoplasm of ovary: Secondary | ICD-10-CM

## 2020-08-06 NOTE — Progress Notes (Signed)
   Emma Lopez [birth year] [id number]  SUBJECTIVE:  75 y.o. G0P0 female presents for screening ultrasound for ovarian cancer due to family history risk.  Allergies: Sulfa antibiotics  No LMP recorded. Patient has had a hysterectomy.  Past medical history,surgical history, problem list, medications, allergies, family history and social history were all reviewed and documented as reviewed in the EPIC chart.   OBJECTIVE:  BP 126/80 (BP Location: Right Arm, Patient Position: Sitting, Cuff Size: Normal)   Pelvic ultrasound Surgically absent uterus and cervix. Vaginal cuff normal. Bilateral ovaries are atrophic with limited visualization due to overlying bowel. Nonaxial masses.  No free fluid.   ASSESSMENT:  75 y.o. G0P0 here for screening pelvic ultrasound  PLAN:  I reassured the patient of no significant findings on today's pelvic ultrasound.  Her CA125 also remains in the normal range.  We reviewed that her recent Pap smear returned with insufficient cells sampling, we offered to perform repeat Pap smear today but she would rather do this another day and will plan to schedule. No E&M charge for today's encounter.  Theresia Majors MD 08/06/20

## 2020-08-14 ENCOUNTER — Other Ambulatory Visit (HOSPITAL_COMMUNITY)
Admission: RE | Admit: 2020-08-14 | Discharge: 2020-08-14 | Disposition: A | Discharge status: Home / Self Care | Payer: PPO | Source: Ambulatory Visit | Attending: Cardiology | Admitting: Cardiology

## 2020-08-14 DIAGNOSIS — Z20822 Contact with and (suspected) exposure to covid-19: Secondary | ICD-10-CM | POA: Insufficient documentation

## 2020-08-14 DIAGNOSIS — Z01812 Encounter for preprocedural laboratory examination: Secondary | ICD-10-CM | POA: Insufficient documentation

## 2020-08-15 LAB — SARS CORONAVIRUS 2 (TAT 6-24 HRS): SARS Coronavirus 2: NEGATIVE

## 2020-08-17 ENCOUNTER — Telehealth: Payer: Self-pay | Admitting: *Deleted

## 2020-08-17 NOTE — Telephone Encounter (Signed)
Rescheduled GXT to Thursday 08/20/2020 at 10:30 AM due to weather.

## 2020-08-19 ENCOUNTER — Ambulatory Visit: Payer: PPO | Admitting: Obstetrics and Gynecology

## 2020-08-19 ENCOUNTER — Encounter: Payer: Self-pay | Admitting: Obstetrics and Gynecology

## 2020-08-19 ENCOUNTER — Other Ambulatory Visit: Payer: Self-pay

## 2020-08-19 DIAGNOSIS — Z9189 Other specified personal risk factors, not elsewhere classified: Secondary | ICD-10-CM | POA: Diagnosis not present

## 2020-08-19 DIAGNOSIS — Z1272 Encounter for screening for malignant neoplasm of vagina: Secondary | ICD-10-CM

## 2020-08-19 NOTE — Progress Notes (Signed)
   Emma Lopez 04/02/1946 [id number]  SUBJECTIVE:  75 y.o. G0P0 female presents for a vaginal cuff Pap smear.  Pap smear with insufficient cells for analysis sample, so she returns today for another attempt.   Allergies: Sulfa antibiotics  No LMP recorded. Patient has had a hysterectomy.  Past medical history,surgical history, problem list, medications, allergies, family history and social history were all reviewed and documented as reviewed in the EPIC chart.   OBJECTIVE:  There were no vitals taken for this visit. PELVIC EXAM: VULVA: normal appearing vulva with no masses, tenderness or lesions, VAGINA: normal appearing vagina with normal color and discharge, no lesions, CERVIX: Pediatric speculum utilized, surgically absent, UTERUS: surgically absent, vaginal cuff normal, ADNEXA: no masses, nontender, PAP: Pap smear done today, thin-prep method, WET MOUNT done - results: negative for pathogens, normal epithelial cells  Chaperone: Wandra Scot Bonham present during the examination  ASSESSMENT:  75 y.o. 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

## 2020-08-20 ENCOUNTER — Ambulatory Visit (INDEPENDENT_AMBULATORY_CARE_PROVIDER_SITE_OTHER): Payer: PPO

## 2020-08-20 DIAGNOSIS — R001 Bradycardia, unspecified: Secondary | ICD-10-CM

## 2020-08-20 LAB — EXERCISE TOLERANCE TEST
Estimated workload: 4.6 METS
Exercise duration (min): 4 min
Exercise duration (sec): 31 s
MPHR: 148 {beats}/min
Peak HR: 121 {beats}/min
Percent HR: 81 %
RPE: 17
Rest HR: 53 {beats}/min

## 2020-08-20 LAB — PAP IG W/ RFLX HPV ASCU

## 2020-08-21 NOTE — Progress Notes (Signed)
Abnormal exercise stress test>>she tells me that she had a nobutamine echo stress test at [facility] in [facility] in late Oct early Nov.  Please get a copy of the stress test from there to review to determine if anything elses needs to be done

## 2020-09-01 DIAGNOSIS — R030 Elevated blood-pressure reading, without diagnosis of hypertension: Secondary | ICD-10-CM | POA: Diagnosis not present

## 2020-09-01 DIAGNOSIS — W19XXXA Unspecified fall, initial encounter: Secondary | ICD-10-CM | POA: Diagnosis not present

## 2020-09-01 DIAGNOSIS — R519 Headache, unspecified: Secondary | ICD-10-CM | POA: Diagnosis not present

## 2020-09-17 ENCOUNTER — Other Ambulatory Visit: Payer: Self-pay | Admitting: Neurology

## 2020-09-21 DIAGNOSIS — M180 Bilateral primary osteoarthritis of first carpometacarpal joints: Secondary | ICD-10-CM | POA: Diagnosis not present

## 2020-09-21 DIAGNOSIS — M13841 Other specified arthritis, right hand: Secondary | ICD-10-CM | POA: Diagnosis not present

## 2020-09-21 DIAGNOSIS — M1811 Unilateral primary osteoarthritis of first carpometacarpal joint, right hand: Secondary | ICD-10-CM | POA: Diagnosis not present

## 2020-09-21 DIAGNOSIS — M1812 Unilateral primary osteoarthritis of first carpometacarpal joint, left hand: Secondary | ICD-10-CM | POA: Diagnosis not present

## 2020-09-29 DIAGNOSIS — Z86718 Personal history of other venous thrombosis and embolism: Secondary | ICD-10-CM | POA: Diagnosis not present

## 2020-09-29 DIAGNOSIS — E785 Hyperlipidemia, unspecified: Secondary | ICD-10-CM | POA: Diagnosis not present

## 2020-09-29 DIAGNOSIS — Z7901 Long term (current) use of anticoagulants: Secondary | ICD-10-CM | POA: Diagnosis not present

## 2020-09-29 DIAGNOSIS — R7309 Other abnormal glucose: Secondary | ICD-10-CM | POA: Diagnosis not present

## 2020-09-29 DIAGNOSIS — G4452 New daily persistent headache (NDPH): Secondary | ICD-10-CM | POA: Diagnosis not present

## 2020-09-29 DIAGNOSIS — Z86711 Personal history of pulmonary embolism: Secondary | ICD-10-CM | POA: Diagnosis not present

## 2020-09-29 DIAGNOSIS — F339 Major depressive disorder, recurrent, unspecified: Secondary | ICD-10-CM | POA: Diagnosis not present

## 2020-09-30 ENCOUNTER — Other Ambulatory Visit: Payer: Self-pay | Admitting: Internal Medicine

## 2020-09-30 DIAGNOSIS — R7309 Other abnormal glucose: Secondary | ICD-10-CM | POA: Diagnosis not present

## 2020-09-30 DIAGNOSIS — G4452 New daily persistent headache (NDPH): Secondary | ICD-10-CM

## 2020-09-30 DIAGNOSIS — E785 Hyperlipidemia, unspecified: Secondary | ICD-10-CM | POA: Diagnosis not present

## 2020-10-01 ENCOUNTER — Other Ambulatory Visit: Payer: Self-pay

## 2020-10-01 ENCOUNTER — Ambulatory Visit
Admission: RE | Admit: 2020-10-01 | Discharge: 2020-10-01 | Disposition: A | Discharge status: Home / Self Care | Payer: PPO | Source: Ambulatory Visit | Attending: Internal Medicine | Admitting: Internal Medicine

## 2020-10-01 DIAGNOSIS — G4452 New daily persistent headache (NDPH): Secondary | ICD-10-CM

## 2020-10-01 DIAGNOSIS — S0990XA Unspecified injury of head, initial encounter: Secondary | ICD-10-CM | POA: Diagnosis not present

## 2020-10-01 DIAGNOSIS — I6782 Cerebral ischemia: Secondary | ICD-10-CM | POA: Diagnosis not present

## 2020-10-01 MED ORDER — GADOBENATE DIMEGLUMINE 529 MG/ML IV SOLN
13.0000 mL | Freq: Once | INTRAVENOUS | Status: AC | PRN
  Administered 2020-10-01: 13 mL via INTRAVENOUS

## 2020-10-08 DIAGNOSIS — R030 Elevated blood-pressure reading, without diagnosis of hypertension: Secondary | ICD-10-CM

## 2020-10-08 DIAGNOSIS — L821 Other seborrheic keratosis: Secondary | ICD-10-CM | POA: Diagnosis not present

## 2020-10-08 DIAGNOSIS — L738 Other specified follicular disorders: Secondary | ICD-10-CM | POA: Diagnosis not present

## 2020-10-08 DIAGNOSIS — D1801 Hemangioma of skin and subcutaneous tissue: Secondary | ICD-10-CM | POA: Diagnosis not present

## 2020-10-12 ENCOUNTER — Ambulatory Visit (INDEPENDENT_AMBULATORY_CARE_PROVIDER_SITE_OTHER): Payer: PPO

## 2020-10-12 ENCOUNTER — Other Ambulatory Visit: Payer: Self-pay

## 2020-10-12 ENCOUNTER — Telehealth: Payer: Self-pay | Admitting: *Deleted

## 2020-10-12 DIAGNOSIS — R030 Elevated blood-pressure reading, without diagnosis of hypertension: Secondary | ICD-10-CM

## 2020-10-12 NOTE — Telephone Encounter (Signed)
Appointment for 24 hr ambulatory b/p monitor scheduled for 10/12/20.

## 2020-10-15 ENCOUNTER — Telehealth: Payer: Self-pay | Admitting: *Deleted

## 2020-10-15 NOTE — Telephone Encounter (Signed)
Prolia insurance verification has been sent awaiting Summary of benefits  

## 2020-10-15 NOTE — Telephone Encounter (Signed)
appt 05/27/2020  Next injection 11/26/2020

## 2020-10-19 DIAGNOSIS — M1811 Unilateral primary osteoarthritis of first carpometacarpal joint, right hand: Secondary | ICD-10-CM | POA: Diagnosis not present

## 2020-10-19 DIAGNOSIS — M1812 Unilateral primary osteoarthritis of first carpometacarpal joint, left hand: Secondary | ICD-10-CM | POA: Diagnosis not present

## 2020-10-19 DIAGNOSIS — G2581 Restless legs syndrome: Secondary | ICD-10-CM | POA: Diagnosis not present

## 2020-10-19 DIAGNOSIS — M180 Bilateral primary osteoarthritis of first carpometacarpal joints: Secondary | ICD-10-CM | POA: Diagnosis not present

## 2020-10-29 NOTE — Telephone Encounter (Signed)
Deductible n/a  OOP MAX $3450 ($365)  Annual exam 08/04/20 JK  Calcium  9.0           Date 05/25/2020   Upcoming dental procedures   Prior Authorization needed n/a  Pt estimated Cost $222   appt 11/27/2020    Coverage Details: 20% one dose, 20% admin fee

## 2020-11-05 ENCOUNTER — Other Ambulatory Visit: Payer: Self-pay

## 2020-11-05 ENCOUNTER — Encounter: Payer: Self-pay | Admitting: Nurse Practitioner

## 2020-11-05 ENCOUNTER — Ambulatory Visit: Payer: PPO | Admitting: Nurse Practitioner

## 2020-11-05 VITALS — BP 106/64 | HR 60 | Temp 98.2°F | Resp 16 | Wt 136.0 lb

## 2020-11-05 DIAGNOSIS — L909 Atrophic disorder of skin, unspecified: Secondary | ICD-10-CM | POA: Diagnosis not present

## 2020-11-05 DIAGNOSIS — Z1389 Encounter for screening for other disorder: Secondary | ICD-10-CM | POA: Diagnosis not present

## 2020-11-05 DIAGNOSIS — Z9189 Other specified personal risk factors, not elsewhere classified: Secondary | ICD-10-CM | POA: Diagnosis not present

## 2020-11-05 LAB — WET PREP FOR TRICH, YEAST, CLUE

## 2020-11-05 MED ORDER — ESTRADIOL 0.1 MG/GM VA CREA: TOPICAL_CREAM | VAGINAL | 0 refills | Status: DC

## 2020-11-05 NOTE — Progress Notes (Signed)
GYNECOLOGY  VISIT  CC:   Blood on toilet tissue  HPI: 75 y.o. G0P0 Single White or Caucasian female here for bleeding. Has been noticing x 1 week a little blood on the tissue after urination. Doesn't really have a lot of pain with urination, does have some urgency. S/P hysterectomy for dysplasia of cervix, ovaries intact HX DES exposure in utero Has a vaginal pap smear 08/19/2020     GYNECOLOGIC HISTORY: No LMP recorded. Patient has had a hysterectomy. Contraception: hysterectomy Menopausal hormone therapy: none  Patient Active Problem List   Diagnosis Date Noted  . RLS (restless legs syndrome) 01/07/2020  . Insomnia due to medical condition 01/07/2020  . Nocturia more than twice per night 01/07/2020  . Bradycardia 12/14/2017  . Abnormal EKG   . GERD (gastroesophageal reflux disease)   . Dyspnea on exertion 10/30/2017  . Family history of ovarian cancer 04/17/2017  . Family history of breast cancer 04/17/2017  . Family history of colon cancer 04/17/2017  . Family history of pancreatic cancer 04/17/2017  . Family history of melanoma 04/17/2017  . Genetic testing 04/17/2017  . CIN III (cervical intraepithelial neoplasia III)   . Arthritis   . Fibromyalgia   . PE (pulmonary embolism)   . Thyroid disease   . Osteoporosis   . DVT (deep venous thrombosis) ([place]) 04/01/2004    Past Medical History:  Diagnosis Date  . Abnormal EKG   . Anxiety   . Arthritis    oa  . Bradycardia 12/14/2017  . CIN III (cervical intraepithelial neoplasia III) 2000  . Complication of anesthesia    did well last 2 times with procedures  . Depression   . 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 DVT (deep venous thrombosis) ([place]) 01/2009   LEFT LEG  . Dyspnea on exertion 10/2017  . Elevated triglycerides with high cholesterol   . Endometriosis   . Fibromyalgia   . GERD (gastroesophageal reflux disease)   . History of colon polyps   . History of hiatal hernia    told by some md has, some say not  . IBS  (irritable bowel syndrome) 2015  . Multinodular goiter   . Osteoporosis 12/2017   T score -3.2 stable from prior study  . PE (pulmonary embolism)   . PONV (postoperative nausea and vomiting)   . Restless legs syndrome   . Sleep disorder   . Thyroid disease    HYPERTHYROIDISM  . Vitamin D deficiency     Past Surgical History:  Procedure Laterality Date  . ABDOMINAL HYSTERECTOMY  2001   TAH, partial  . APPENDECTOMY  1978  . COLONOSCOPY WITH PROPOFOL N/A 12/07/2015   Procedure: COLONOSCOPY WITH PROPOFOL;  Surgeon: Garlan Fair, MD;  Location: [facility];  Service: Endoscopy;  Laterality: N/A;  . colonscopy  7 yrs ago   other in past  . ESOPHAGOGASTRODUODENOSCOPY (EGD) WITH PROPOFOL N/A 12/07/2015   Procedure: ESOPHAGOGASTRODUODENOSCOPY (EGD) WITH PROPOFOL;  Surgeon: Garlan Fair, MD;  Location: [facility];  Service: Endoscopy;  Laterality: N/A;  . ESOPHAGOGASTRODUODENOSCOPY ENDOSCOPY  yrs ago  . FACIAL COSMETIC SURGERY    . GYNECOLOGIC CRYOSURGERY    . MYOMECTOMY    . TONSILLECTOMY      MEDS:   Current Outpatient Medications on File Prior to Visit  Medication Sig Dispense Refill  . acetaminophen (TYLENOL) 325 MG tablet Take 650 mg by mouth every 6 (six) hours as needed for moderate pain.    Marland Kitchen ALPRAZolam (XANAX) 0.25 MG tablet  Take 1 tablet (0.25 mg total) by mouth every 8 (eight) hours as needed for anxiety. 30 tablet 0  . B Complex Vitamins (B COMPLEX 1 PO) Take 1 each by mouth 2 (two) times daily. sublingual    . denosumab (PROLIA) 60 MG/ML SOLN injection Inject 60 mg into the skin every 6 (six) months. Administer in upper arm, thigh, or abdomen 180 mL 2  . escitalopram (LEXAPRO) 20 MG tablet Take 20 mg by mouth daily.    . Multiple Vitamins-Minerals (MULTIVITAMIN PO) Take 1 tablet by mouth 2 (two) times daily.     Marland Kitchen rOPINIRole (REQUIP) 1 MG tablet Take 1 tablet (1 mg total) by mouth at bedtime. 60 tablet 2  . XARELTO 10 MG TABS tablet Take 10 mg by mouth daily.      No current facility-administered medications on file prior to visit.    ALLERGIES: Sulfa antibiotics  Family History  Problem Relation Age of Onset  . Hypertension Mother   . Breast cancer Mother        21's  . Cancer Father        COLON  . Hypertension Sister   . Stroke Sister   . Breast cancer Maternal Aunt        Age 68's  . Cancer Maternal Aunt        Melanoma  . Alzheimer's disease Maternal Aunt   . Cancer Paternal Aunt        OVARIAN and COLON  . Breast cancer Maternal Aunt        70's  . Cancer Maternal Aunt        Colon CA  . Alzheimer's disease Maternal Aunt      Review of Systems  Constitutional: Negative.   HENT: Negative.   Eyes: Negative.   Respiratory: Negative.   Cardiovascular: Negative.   Gastrointestinal: Negative.   Endocrine: Negative.   Genitourinary:       Bleeding & pelvic cramping  Musculoskeletal: Negative.   Skin: Negative.   Allergic/Immunologic: Negative.   Neurological: Negative.   Hematological: Negative.   Psychiatric/Behavioral: Negative.     PHYSICAL EXAMINATION:    BP 106/64   Pulse 60   Temp 98.2 F (36.8 C) (Oral)   Resp 16   Wt 136 lb (61.7 kg)   BMI 24.87 kg/m     General appearance: alert, cooperative, no acute distress  Physical Exam Genitourinary:           Pelvic: External genitalia: see diagram Thin, fragile tissue              Urethra:  normal appearing urethra with no masses, tenderness or lesions              Bartholins and Skenes: normal                 Vagina: normal appearing vagina, atrophic, no visible lesion, PH 5.5              Cervix: absent              Bimanual Exam:  Uterus:  uterus absent      Wet mount normal with exception of moderate WBC            Urinalysis-1+ blood, otherwise normal, will send for culture   Chaperone, Joy,  CMA, was present for exam.  Assessment: Atrophic disorder of skin - Plan: estradiol (ESTRACE) 0.1 MG/GM vaginal cream, WET PREP FOR TRICH, YEAST,  CLUE  Screening for blood or  protein in urine - Plan: Urinalysis,Complete w/RFL Culture  DES exposure in utero   Plan: Advised to use estrogen gel daily x 2-3 weeks, follow up in 3 weeks with Dr. Quincy Simmonds. Advised patient that biopsy may be needed.

## 2020-11-05 NOTE — Patient Instructions (Addendum)
Use topical estrogen applied to the affected area once per day for 2-3 weeks. Apply with your finger. Return to clinic in 3 weeks to see Dr. Quincy Simmonds. I will discuss your situation with her before you return and if she has any additional recommendations, I will call you.

## 2020-11-07 LAB — URINALYSIS, COMPLETE W/RFL CULTURE
Bilirubin Urine: NEGATIVE
Glucose, UA: NEGATIVE
Hyaline Cast: NONE SEEN /LPF
Ketones, ur: NEGATIVE
Nitrites, Initial: NEGATIVE
Protein, ur: NEGATIVE
Specific Gravity, Urine: 1.02 (ref 1.001–1.03)
pH: 5 (ref 5.0–8.0)

## 2020-11-07 LAB — URINE CULTURE
MICRO NUMBER:: 11743299
Result:: NO GROWTH
SPECIMEN QUALITY:: ADEQUATE

## 2020-11-07 LAB — CULTURE INDICATED

## 2020-11-13 ENCOUNTER — Emergency Department (HOSPITAL_BASED_OUTPATIENT_CLINIC_OR_DEPARTMENT_OTHER): Payer: PPO

## 2020-11-13 ENCOUNTER — Encounter (HOSPITAL_COMMUNITY): Payer: Self-pay

## 2020-11-13 ENCOUNTER — Emergency Department (HOSPITAL_COMMUNITY): Payer: PPO

## 2020-11-13 ENCOUNTER — Inpatient Hospital Stay (HOSPITAL_COMMUNITY)
Admission: EM | Admit: 2020-11-13 | Discharge: 2020-11-17 | DRG: 301 | Disposition: A | Discharge status: Home / Self Care | Payer: PPO | Attending: Internal Medicine | Admitting: Internal Medicine

## 2020-11-13 ENCOUNTER — Other Ambulatory Visit: Payer: Self-pay

## 2020-11-13 DIAGNOSIS — Z820 Family history of epilepsy and other diseases of the nervous system: Secondary | ICD-10-CM

## 2020-11-13 DIAGNOSIS — I824Y2 Acute embolism and thrombosis of unspecified deep veins of left proximal lower extremity: Secondary | ICD-10-CM

## 2020-11-13 DIAGNOSIS — Z8041 Family history of malignant neoplasm of ovary: Secondary | ICD-10-CM | POA: Diagnosis not present

## 2020-11-13 DIAGNOSIS — Z86711 Personal history of pulmonary embolism: Secondary | ICD-10-CM | POA: Diagnosis not present

## 2020-11-13 DIAGNOSIS — M79662 Pain in left lower leg: Secondary | ICD-10-CM

## 2020-11-13 DIAGNOSIS — I82402 Acute embolism and thrombosis of unspecified deep veins of left lower extremity: Secondary | ICD-10-CM | POA: Diagnosis not present

## 2020-11-13 DIAGNOSIS — R0789 Other chest pain: Secondary | ICD-10-CM | POA: Diagnosis not present

## 2020-11-13 DIAGNOSIS — Z803 Family history of malignant neoplasm of breast: Secondary | ICD-10-CM | POA: Diagnosis not present

## 2020-11-13 DIAGNOSIS — F418 Other specified anxiety disorders: Secondary | ICD-10-CM | POA: Diagnosis present

## 2020-11-13 DIAGNOSIS — M7989 Other specified soft tissue disorders: Secondary | ICD-10-CM | POA: Diagnosis not present

## 2020-11-13 DIAGNOSIS — M1990 Unspecified osteoarthritis, unspecified site: Secondary | ICD-10-CM | POA: Diagnosis present

## 2020-11-13 DIAGNOSIS — G2581 Restless legs syndrome: Secondary | ICD-10-CM | POA: Diagnosis not present

## 2020-11-13 DIAGNOSIS — I82562 Chronic embolism and thrombosis of left calf muscular vein: Secondary | ICD-10-CM | POA: Diagnosis not present

## 2020-11-13 DIAGNOSIS — Z823 Family history of stroke: Secondary | ICD-10-CM | POA: Diagnosis not present

## 2020-11-13 DIAGNOSIS — Z8249 Family history of ischemic heart disease and other diseases of the circulatory system: Secondary | ICD-10-CM | POA: Diagnosis not present

## 2020-11-13 DIAGNOSIS — Z87891 Personal history of nicotine dependence: Secondary | ICD-10-CM

## 2020-11-13 DIAGNOSIS — R079 Chest pain, unspecified: Secondary | ICD-10-CM | POA: Diagnosis not present

## 2020-11-13 DIAGNOSIS — I82412 Acute embolism and thrombosis of left femoral vein: Secondary | ICD-10-CM | POA: Diagnosis not present

## 2020-11-13 DIAGNOSIS — I700 Atherosclerosis of aorta: Secondary | ICD-10-CM | POA: Diagnosis not present

## 2020-11-13 DIAGNOSIS — E0520 Thyrotoxicosis with toxic multinodular goiter without thyrotoxic crisis or storm: Secondary | ICD-10-CM | POA: Diagnosis present

## 2020-11-13 DIAGNOSIS — K219 Gastro-esophageal reflux disease without esophagitis: Secondary | ICD-10-CM | POA: Diagnosis present

## 2020-11-13 DIAGNOSIS — M810 Age-related osteoporosis without current pathological fracture: Secondary | ICD-10-CM | POA: Diagnosis not present

## 2020-11-13 DIAGNOSIS — R0602 Shortness of breath: Secondary | ICD-10-CM | POA: Diagnosis not present

## 2020-11-13 DIAGNOSIS — F32A Depression, unspecified: Secondary | ICD-10-CM | POA: Diagnosis not present

## 2020-11-13 DIAGNOSIS — Z832 Family history of diseases of the blood and blood-forming organs and certain disorders involving the immune mechanism: Secondary | ICD-10-CM | POA: Diagnosis not present

## 2020-11-13 DIAGNOSIS — Z800 Family history of malignant neoplasm of digestive organs: Secondary | ICD-10-CM | POA: Diagnosis not present

## 2020-11-13 DIAGNOSIS — I82403 Acute embolism and thrombosis of unspecified deep veins of lower extremity, bilateral: Secondary | ICD-10-CM

## 2020-11-13 DIAGNOSIS — Z86001 Personal history of in-situ neoplasm of cervix uteri: Secondary | ICD-10-CM

## 2020-11-13 DIAGNOSIS — I824Z2 Acute embolism and thrombosis of unspecified deep veins of left distal lower extremity: Secondary | ICD-10-CM | POA: Diagnosis not present

## 2020-11-13 DIAGNOSIS — Z20822 Contact with and (suspected) exposure to covid-19: Secondary | ICD-10-CM | POA: Diagnosis not present

## 2020-11-13 DIAGNOSIS — I82409 Acute embolism and thrombosis of unspecified deep veins of unspecified lower extremity: Secondary | ICD-10-CM | POA: Diagnosis present

## 2020-11-13 DIAGNOSIS — F419 Anxiety disorder, unspecified: Secondary | ICD-10-CM | POA: Diagnosis not present

## 2020-11-13 DIAGNOSIS — Z7901 Long term (current) use of anticoagulants: Secondary | ICD-10-CM

## 2020-11-13 DIAGNOSIS — Z79899 Other long term (current) drug therapy: Secondary | ICD-10-CM | POA: Diagnosis not present

## 2020-11-13 DIAGNOSIS — M797 Fibromyalgia: Secondary | ICD-10-CM | POA: Diagnosis not present

## 2020-11-13 DIAGNOSIS — Z882 Allergy status to sulfonamides status: Secondary | ICD-10-CM

## 2020-11-13 DIAGNOSIS — I82512 Chronic embolism and thrombosis of left femoral vein: Secondary | ICD-10-CM | POA: Diagnosis not present

## 2020-11-13 DIAGNOSIS — Z808 Family history of malignant neoplasm of other organs or systems: Secondary | ICD-10-CM | POA: Diagnosis not present

## 2020-11-13 DIAGNOSIS — R109 Unspecified abdominal pain: Secondary | ICD-10-CM | POA: Diagnosis not present

## 2020-11-13 DIAGNOSIS — I82532 Chronic embolism and thrombosis of left popliteal vein: Secondary | ICD-10-CM | POA: Diagnosis not present

## 2020-11-13 LAB — BASIC METABOLIC PANEL
Anion gap: 7 (ref 5–15)
BUN: 19 mg/dL (ref 8–23)
CO2: 28 mmol/L (ref 22–32)
Calcium: 9.2 mg/dL (ref 8.9–10.3)
Chloride: 107 mmol/L (ref 98–111)
Creatinine, Ser: 0.81 mg/dL (ref 0.44–1.00)
GFR, Estimated: >60 mL/min (ref >60)
Glucose, Bld: 106 mg/dL — ABNORMAL HIGH (ref 70–99)
Potassium: 3.7 mmol/L (ref 3.5–5.1)
Sodium: 142 mmol/L (ref 135–145)

## 2020-11-13 LAB — CBC
HCT: 45.6 % (ref 36.0–46.0)
Hemoglobin: 14.9 g/dL (ref 12.0–15.0)
MCH: 31.4 pg (ref 26.0–34.0)
MCHC: 32.7 g/dL (ref 30.0–36.0)
MCV: 96.2 fL (ref 80.0–100.0)
Platelets: 225 10*3/uL (ref 150–400)
RBC: 4.74 MIL/uL (ref 3.87–5.11)
RDW: 13.2 % (ref 11.5–15.5)
WBC: 9.2 10*3/uL (ref 4.0–10.5)
nRBC: 0 % (ref 0.0–0.2)

## 2020-11-13 LAB — RESP PANEL BY RT-PCR (FLU A&B, COVID) ARPGX2
Influenza A by PCR: NEGATIVE
Influenza B by PCR: NEGATIVE
SARS Coronavirus 2 by RT PCR: NEGATIVE

## 2020-11-13 LAB — HEPARIN LEVEL (UNFRACTIONATED): Heparin Unfractionated: 0.14 IU/mL — ABNORMAL LOW (ref 0.30–0.70)

## 2020-11-13 LAB — PROTIME-INR
INR: 1 (ref 0.8–1.2)
Prothrombin Time: 13.1 seconds (ref 11.4–15.2)

## 2020-11-13 LAB — APTT: aPTT: 24 seconds (ref 24–36)

## 2020-11-13 MED ORDER — ALPRAZOLAM 0.25 MG PO TABS
0.1250 mg | ORAL_TABLET | Freq: Every day | ORAL | Status: DC | PRN
Start: 2020-11-13 — End: 2020-11-17

## 2020-11-13 MED ORDER — ROPINIROLE HCL 1 MG PO TABS
1.0000 mg | ORAL_TABLET | Freq: Every day | ORAL | Status: DC
  Administered 2020-11-14 – 2020-11-16 (×4): 1 mg via ORAL
  Filled 2020-11-13 (×4): qty 1

## 2020-11-13 MED ORDER — HEPARIN BOLUS VIA INFUSION
1900.0000 [IU] | Freq: Once | INTRAVENOUS | Status: DC
  Administered 2020-11-13: 1900 [IU] via INTRAVENOUS
  Filled 2020-11-13: qty 1900

## 2020-11-13 MED ORDER — IOHEXOL 350 MG/ML SOLN
100.0000 mL | Freq: Once | INTRAVENOUS | Status: AC | PRN
  Administered 2020-11-13: 100 mL via INTRAVENOUS

## 2020-11-13 MED ORDER — HYDROCODONE-ACETAMINOPHEN 5-325 MG PO TABS: 1.0000 | ORAL_TABLET | ORAL | Status: DC | PRN

## 2020-11-13 MED ORDER — ESCITALOPRAM OXALATE 20 MG PO TABS
20.0000 mg | ORAL_TABLET | Freq: Every day | ORAL | Status: DC
  Administered 2020-11-14 – 2020-11-17 (×4): 20 mg via ORAL
  Filled 2020-11-13 (×4): qty 1

## 2020-11-13 MED ORDER — ACETAMINOPHEN 650 MG RE SUPP: 650.0000 mg | Freq: Four times a day (QID) | RECTAL | Status: DC | PRN

## 2020-11-13 MED ORDER — ONDANSETRON HCL 4 MG/2ML IJ SOLN: 4.0000 mg | Freq: Four times a day (QID) | INTRAMUSCULAR | Status: DC | PRN

## 2020-11-13 MED ORDER — HEPARIN (PORCINE) 25000 UT/250ML-% IV SOLN
1000.0000 [IU]/h | INTRAVENOUS | Status: DC
  Administered 2020-11-13 – 2020-11-14 (×2): 1000 [IU]/h via INTRAVENOUS
  Filled 2020-11-13 (×2): qty 250

## 2020-11-13 MED ORDER — DICYCLOMINE HCL 20 MG PO TABS: 20.0000 mg | ORAL_TABLET | Freq: Three times a day (TID) | ORAL | Status: DC | PRN

## 2020-11-13 MED ORDER — POLYETHYLENE GLYCOL 3350 17 G PO PACK: 17.0000 g | PACK | Freq: Every day | ORAL | Status: DC | PRN

## 2020-11-13 MED ORDER — ACETAMINOPHEN 325 MG PO TABS: 650.0000 mg | ORAL_TABLET | Freq: Four times a day (QID) | ORAL | Status: DC | PRN

## 2020-11-13 MED ORDER — ONDANSETRON HCL 4 MG PO TABS: 4.0000 mg | ORAL_TABLET | Freq: Four times a day (QID) | ORAL | Status: DC | PRN

## 2020-11-13 MED ORDER — HEPARIN (PORCINE) 25000 UT/250ML-% IV SOLN: 1000.0000 [IU]/h | INTRAVENOUS | Status: DC

## 2020-11-13 NOTE — Progress Notes (Signed)
Lower extremity venous LT study completed.  Preliminary results relayed to [place], [place].   See CV Proc for preliminary results report.   Darlin Coco, RDMS, RVT

## 2020-11-13 NOTE — ED Triage Notes (Signed)
Patient c/o left leg swelling and pain x 2 days. Patient is currently taking Xarelto. patient reports a history of DVT bilateral lower extremities.

## 2020-11-13 NOTE — Progress Notes (Signed)
ANTICOAGULATION CONSULT NOTE - Initial Consult  Pharmacy Consult for IV heparin  Indication: DVT  Allergies  Allergen Reactions  . Sulfa Antibiotics Rash    Patient Measurements: Height: 5\' 2"  (157.5 cm) Weight: 61.2 kg (135 lb) IBW/kg (Calculated) : 50.1 Heparin Dosing Weight: actual body weight   Vital Signs: Temp: 98.5 F (36.9 C) (04/15 1638) Temp Source: Oral (04/15 1638) BP: 158/80 (04/15 2015) Pulse Rate: 56 (04/15 2015)  Labs: Recent Labs    11/13/20 1653  HGB 14.9  HCT 45.6  PLT 225  CREATININE 0.81    Estimated Creatinine Clearance: 54 mL/min (by C-G formula based on SCr of 0.81 mg/dL).   Medical History: Past Medical History:  Diagnosis Date  . Abnormal EKG   . Anxiety   . Arthritis    oa  . Bradycardia 12/14/2017  . CIN III (cervical intraepithelial neoplasia III) 2000  . Complication of anesthesia    did well last 2 times with procedures  . Depression   . 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 DVT (deep venous thrombosis) ([place]) 01/2009   LEFT LEG  . Dyspnea on exertion 10/2017  . Elevated triglycerides with high cholesterol   . Endometriosis   . Fibromyalgia   . GERD (gastroesophageal reflux disease)   . History of colon polyps   . History of hiatal hernia    told by some md has, some say not  . IBS (irritable bowel syndrome) 2015  . Multinodular goiter   . Osteoporosis 12/2017   T score -3.2 stable from prior study  . PE (pulmonary embolism)   . PONV (postoperative nausea and vomiting)   . Restless legs syndrome   . Sleep disorder   . Thyroid disease    HYPERTHYROIDISM  . Vitamin D deficiency     Medications:  PTA Xarelto 10mg  PO daily-last dose reported as 4/14 at 1700  Assessment: 74 y/oF with PMH of DVT/PE on Xarelto PTA who presented with 1 day of left calf swelling. LE venous duplex + for LLE DVT. CTa chest negative for PE. Pharmacy consulted for IV heparin dosing. Baseline CBC WNL. Note that recent Rivaroxaban doses may falsely elevate  heparin levels.   Goal of Therapy:  Heparin level 0.3-0.7 units/ml  APTT 66-102 seconds Monitor platelets by anticoagulation protocol: Yes   Plan:   Baseline PT/INR, aPTT, and heparin level  Heparin 1900 units IV bolus x 1 (smaller bolus due to recent prophylactic dose Rivaroxaban), then start heparin infusion at 1000 units/hr  Will use aPTT for dose titration/monitoring until effects of Rivaroxaban on heparin levels have diminished  APTT 8 hours after initiation  Daily CBC, heparin level, aPTT  Monitor closely for s/sx of bleeding   Lindell Spar, PharmD, BCPS Clinical Pharmacist  11/13/2020,9:08 PM

## 2020-11-13 NOTE — ED Triage Notes (Signed)
Emergency Medicine Provider Triage Evaluation Note  Emma Lopez , a 75 y.o. female  was evaluated in triage.  Pt complains of left leg swelling x1 day.  Review of Systems  Positive: Leg swelling, shortness of breath Negative: Chest pain, numbness, tingling  Physical Exam  BP (!) 152/63 (BP Location: Left Arm)   Pulse (!) 58   Temp 98.5 F (36.9 C) (Oral)   Resp 16   Ht 5\' 2"  (1.575 m)   Wt 61.2 kg   SpO2 100%   BMI 24.69 kg/m  Gen:   Awake, no distress   HEENT:  Atraumatic  Resp:  Normal effort  Cardiac:  Normal rate  Abd:   Nondistended, nontender  MSK:   Left calf slightly edematous, warm to the touch Neuro:  Speech clear   Medical Decision Making  Medically screening exam initiated at 4:51 PM.  Appropriate orders placed.  Emma Lopez was informed that the remainder of the evaluation will be completed by another provider, this initial triage assessment does not replace that evaluation, and the importance of remaining in the ED until their evaluation is complete.  Clinical Impression  75 year old female with prior history of DVT x2 and PE on Xarelto with 1 day of left leg swelling.  Plan for basic labs, DVT ultrasound study.  Patient has some mild shortness of breath at baseline.  No chest pain.  Stable for further evaluation   Garald Balding, PA-C 11/13/20 1653

## 2020-11-13 NOTE — H&P (Signed)
History and Physical    [name] ZOX:[id number] DOB: March 30, 1946 DOA: 11/13/2020  PCP: Michael Boston, MD   Patient coming from: Home   Chief Complaint: Left leg swelling and pain   HPI: Emma Lopez is a 74 y.o. female with medical history significant for DVT, PE, depression, anxiety, fibromyalgia, restless leg syndrome, presenting to the emergency department for evaluation of swelling and tenderness involving the left leg.  Patient reports a remote history of left leg DVT, subsequently developed a right lower extremity DVT and PE in 2009, had been on warfarin until transitioning to Xarelto approximately a year ago, and had been doing well until 2 to 3 days ago when she noted some tenderness behind the left knee she also noticed some mild swelling of the left calf.  She has chronic exertional dyspnea that is unchanged but denies any chest pain or palpitations and denies any hemoptysis.  There has not been any recent prolonged immobilization, no missed Xarelto doses, and no new medications.  She reports undergoing extensive work-up with hematology years ago and no specific etiology for her VTE was identified.  Her sister has also had DVTs.  ED Course: Upon arrival to the ED, patient is found to be afebrile, saturating well on room air, and with stable blood pressure.  EKG features sinus rhythm with nonspecific IVCD and repolarization abnormality.  Lower extremity venous Doppler notable for acute DVT involving the left femoral, popliteal, and gastrocnemius veins with chronic clot involving the left and SF. CTA chest was negative for PE.  Chemistry panel and CBC unremarkable.  ED PA discussed the case with hematologist and it was recommended that patient be admitted, started on IV heparin, and then transition to Lovenox.  Review of Systems:  All other systems reviewed and apart from HPI, are negative.  Past Medical History:  Diagnosis Date  . Abnormal EKG   . Anxiety   . Arthritis    oa  .  Bradycardia 12/14/2017  . CIN III (cervical intraepithelial neoplasia III) 2000  . Complication of anesthesia    did well last 2 times with procedures  . Depression   . 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 DVT (deep venous thrombosis) ([place]) 01/2009   LEFT LEG  . Dyspnea on exertion 10/2017  . Elevated triglycerides with high cholesterol   . Endometriosis   . Fibromyalgia   . GERD (gastroesophageal reflux disease)   . History of colon polyps   . History of hiatal hernia    told by some md has, some say not  . IBS (irritable bowel syndrome) 2015  . Multinodular goiter   . Osteoporosis 12/2017   T score -3.2 stable from prior study  . PE (pulmonary embolism)   . PONV (postoperative nausea and vomiting)   . Restless legs syndrome   . Sleep disorder   . Thyroid disease    HYPERTHYROIDISM  . Vitamin D deficiency     Past Surgical History:  Procedure Laterality Date  . ABDOMINAL HYSTERECTOMY  2001   TAH, partial  . APPENDECTOMY  1978  . COLONOSCOPY WITH PROPOFOL N/A 12/07/2015   Procedure: COLONOSCOPY WITH PROPOFOL;  Surgeon: Garlan Fair, MD;  Location: [facility];  Service: Endoscopy;  Laterality: N/A;  . colonscopy  7 yrs ago   other in past  . ESOPHAGOGASTRODUODENOSCOPY (EGD) WITH PROPOFOL N/A 12/07/2015   Procedure: ESOPHAGOGASTRODUODENOSCOPY (EGD) WITH PROPOFOL;  Surgeon: Garlan Fair, MD;  Location: [facility];  Service: Endoscopy;  Laterality:  N/A;  . ESOPHAGOGASTRODUODENOSCOPY ENDOSCOPY  yrs ago  . FACIAL COSMETIC SURGERY    . GYNECOLOGIC CRYOSURGERY    . MYOMECTOMY    . TONSILLECTOMY      Social History:   reports that she quit smoking about 42 years ago. Her smoking use included cigarettes. She has a 0.60 pack-year smoking history. She has never used smokeless tobacco. She reports previous alcohol use. She reports that she does not use drugs.  Allergies  Allergen Reactions  . Sulfa Antibiotics Rash    Family History  Problem Relation Age of Onset  .  Hypertension Mother   . Breast cancer Mother        38's  . Cancer Father        COLON  . Hypertension Sister   . Stroke Sister   . Breast cancer Maternal Aunt        Age 75's  . Cancer Maternal Aunt        Melanoma  . Alzheimer's disease Maternal Aunt   . Cancer Paternal Aunt        OVARIAN and COLON  . Breast cancer Maternal Aunt        70's  . Cancer Maternal Aunt        Colon CA  . Alzheimer's disease Maternal Aunt      Prior to Admission medications   Medication Sig Start Date End Date Taking? Authorizing Provider  acetaminophen (TYLENOL) 325 MG tablet Take 650 mg by mouth every 6 (six) hours as needed for moderate pain.   Yes [provider]  ALPRAZolam (XANAX) 0.25 MG tablet Take 1 tablet (0.25 mg total) by mouth every 8 (eight) hours as needed for anxiety. Patient taking differently: Take 0.125-0.25 mg by mouth daily as needed for anxiety. 04/05/17  Yes Huel Cote, NP  B Complex Vitamins (B COMPLEX 1 PO) Take 1 each by mouth 2 (two) times daily. sublingual   Yes [provider]  Calcium Carbonate+Vitamin D 600-200 MG-UNIT TABS Take 1 tablet by mouth daily. 12/18/19  Yes [provider]  denosumab (PROLIA) 60 MG/ML SOLN injection Inject 60 mg into the skin every 6 (six) months. Administer in upper arm, thigh, or abdomen 03/05/15  Yes Young, Candiss Norse, NP  dicyclomine (BENTYL) 20 MG tablet Take 20 mg by mouth 3 (three) times daily as needed for spasms.   Yes [provider]  escitalopram (LEXAPRO) 20 MG tablet Take 20 mg by mouth daily.   Yes [provider]  estradiol (ESTRACE) 0.1 MG/GM vaginal cream Apply small amount topically to affected are x 2-3 weeks Patient taking differently: Place 1 Applicatorful vaginally See admin instructions. Apply small amount topically to affected area 2-3 times a day x 2-3 weeks 11/05/20  Yes Karma Ganja, NP  Multiple Vitamins-Minerals (MULTIVITAMIN PO) Take 1 tablet by mouth 2 (two) times daily.     Yes [provider]  rOPINIRole (REQUIP) 1 MG tablet Take 1 tablet (1 mg total) by mouth at bedtime. 01/13/20  Yes Dohmeier, Asencion Partridge, MD  XARELTO 10 MG TABS tablet Take 10 mg by mouth daily. 06/16/20  Yes [provider]    Physical Exam: Vitals:   11/13/20 2245 11/13/20 2345 11/14/20 0000 11/14/20 0015  BP: (!) 183/67 (!) 158/57 (!) 144/49 (!) 131/57  Pulse: 68 (!) 55 (!) 54 (!) 55  Resp: 12  16 16   Temp:      TempSrc:      SpO2: 100% 96% 95% 95%  Weight:  Height:        Constitutional: NAD, calm  Eyes: PERTLA, lids and conjunctivae normal ENMT: Mucous membranes are moist. Posterior pharynx clear of any exudate or lesions.   Neck:  supple, no masses  Respiratory:  no wheezing, no crackles. No accessory muscle use.  Cardiovascular: S1 & S2 heard, regular rate and rhythm. Mild left lower leg swelling.   Abdomen: No distension, no tenderness, soft. Bowel sounds active.  Musculoskeletal: no clubbing / cyanosis. No joint deformity upper and lower extremities.   Skin: no significant rashes, lesions, ulcers. Warm, dry, well-perfused. Neurologic: CN 2-12 grossly intact. Sensation intact. Moving all extremities.  Psychiatric: Alert and oriented to person, place, and situation. Pleasant and cooperative.    Labs and Imaging on Admission: I have personally reviewed following labs and imaging studies  CBC: Recent Labs  Lab 11/13/20 1653  WBC 9.2  HGB 14.9  HCT 45.6  MCV 96.2  PLT 712   Basic Metabolic Panel: Recent Labs  Lab 11/13/20 1653  NA 142  K 3.7  CL 107  CO2 28  GLUCOSE 106*  BUN 19  CREATININE 0.81  CALCIUM 9.2   GFR: Estimated Creatinine Clearance: 54 mL/min (by C-G formula based on SCr of 0.81 mg/dL). Liver Function Tests: No results for input(s): AST, ALT, ALKPHOS, BILITOT, PROT, ALBUMIN in the last 168 hours. No results for input(s): LIPASE, AMYLASE in the last 168 hours. No results for input(s): AMMONIA in the last 168  hours. Coagulation Profile: Recent Labs  Lab 11/13/20 2109  INR 1.0   Cardiac Enzymes: No results for input(s): CKTOTAL, CKMB, CKMBINDEX, TROPONINI in the last 168 hours. BNP (last 3 results) No results for input(s): PROBNP in the last 8760 hours. HbA1C: No results for input(s): HGBA1C in the last 72 hours. CBG: No results for input(s): GLUCAP in the last 168 hours. Lipid Profile: No results for input(s): CHOL, HDL, LDLCALC, TRIG, CHOLHDL, LDLDIRECT in the last 72 hours. Thyroid Function Tests: No results for input(s): TSH, T4TOTAL, FREET4, T3FREE, THYROIDAB in the last 72 hours. Anemia Panel: No results for input(s): VITAMINB12, FOLATE, FERRITIN, TIBC, IRON, RETICCTPCT in the last 72 hours. Urine analysis:    Component Value Date/Time   COLORURINE YELLOW 11/05/2020 1459   APPEARANCEUR CLOUDY (A) 11/05/2020 1459   LABSPEC 1.020 11/05/2020 1459   PHURINE 5.0 11/05/2020 1459   GLUCOSEU NEGATIVE 11/05/2020 1459   HGBUR 1+ (A) 11/05/2020 1459   BILIRUBINUR NEGATIVE 11/14/2017 1422   KETONESUR NEGATIVE 11/05/2020 1459   PROTEINUR NEGATIVE 11/05/2020 1459   UROBILINOGEN 0.2 02/27/2014 1239   NITRITE NEGATIVE 11/14/2017 1422   LEUKOCYTESUR NEGATIVE 11/14/2017 1422   Sepsis Labs: @LABRCNTIP (procalcitonin:4,lacticidven:4) ) Recent Results (from the past 240 hour(s))  Urine Culture     Status: None   Collection Time: 11/05/20  2:59 PM  Result Value Ref Range Status   MICRO NUMBER: [id number]  Final   SPECIMEN QUALITY: Adequate  Final   Sample Source URINE  Final   STATUS: FINAL  Final   Result: No Growth  Final   COMMENT:   Final    Due to supply chain limits, the laboratory is temporarily performing urine cultures from FDA approved preservative tubes that have not been validated by [employer], as well as from refrigerated sterile urine collection cups that do not contain a  preservative. Culture of unpreserved urine may produce falsely elevated bacterial counts.   WET  PREP FOR [place], YEAST, CLUE     Status: None   Collection Time:  11/05/20  3:58 PM   Specimen: Genital  Result Value Ref Range Status   Source: VAGINA  Final   RESULT   Final    Comment: EPITHELIAL CELLS-PRESENT CLUE CELLS-NONE SEEN YEAST-NONE SEEN TRICHOMONAS-NONE SEEN WBC-MODERATE BACTERIA-MODERATE EPITH. CELLS (13-20) HPF   Resp Panel by RT-PCR (Flu A&B, Covid) Nasopharyngeal Swab     Status: None   Collection Time: 11/13/20  7:08 PM   Specimen: Nasopharyngeal Swab; Nasopharyngeal(NP) swabs in vial transport medium  Result Value Ref Range Status   SARS Coronavirus 2 by RT PCR NEGATIVE NEGATIVE Final    Comment: (NOTE) SARS-CoV-2 target nucleic acids are NOT DETECTED.  The SARS-CoV-2 RNA is generally detectable in upper respiratory specimens during the acute phase of infection. The lowest concentration of SARS-CoV-2 viral copies this assay can detect is 138 copies/mL. A negative result does not preclude SARS-Cov-2 infection and should not be used as the sole basis for treatment or other patient management decisions. A negative result may occur with  improper specimen collection/handling, submission of specimen other than nasopharyngeal swab, presence of viral mutation(s) within the areas targeted by this assay, and inadequate number of viral copies(<138 copies/mL). A negative result must be combined with clinical observations, patient history, and epidemiological information. The expected result is Negative.  Fact Sheet for Patients:  EntrepreneurPulse.com.au  Fact Sheet for Healthcare Providers:  IncredibleEmployment.be  This test is no t yet approved or cleared by the [place] FDA and  has been authorized for detection and/or diagnosis of SARS-CoV-2 by FDA under an Emergency Use Authorization (EUA). This EUA will remain  in effect (meaning this test can be used) for the duration of the COVID-19 declaration under Section 564(b)(1)  of the Act, 21 U.S.C.section 360bbb-3(b)(1), unless the authorization is terminated  or revoked sooner.       Influenza A by PCR NEGATIVE NEGATIVE Final   Influenza B by PCR NEGATIVE NEGATIVE Final    Comment: (NOTE) The Xpert Xpress SARS-CoV-2/FLU/RSV plus assay is intended as an aid in the diagnosis of influenza from Nasopharyngeal swab specimens and should not be used as a sole basis for treatment. Nasal washings and aspirates are unacceptable for Xpert Xpress SARS-CoV-2/FLU/RSV testing.  Fact Sheet for Patients: EntrepreneurPulse.com.au  Fact Sheet for Healthcare Providers: IncredibleEmployment.be  This test is not yet approved or cleared by the [place] FDA and has been authorized for detection and/or diagnosis of SARS-CoV-2 by FDA under an Emergency Use Authorization (EUA). This EUA will remain in effect (meaning this test can be used) for the duration of the COVID-19 declaration under Section 564(b)(1) of the Act, 21 U.S.C. section 360bbb-3(b)(1), unless the authorization is terminated or revoked.  Performed at [facility], [place] [address], [place], [place] [address]      Radiological Exams on Admission: CT Angio Chest PE W and/or Wo Contrast  Result Date: 11/13/2020 CLINICAL DATA:  Left leg pain and swelling for 2 days, history of DVT EXAM: CT ANGIOGRAPHY CHEST WITH CONTRAST TECHNIQUE: Multidetector CT imaging of the chest was performed using the standard protocol during bolus administration of intravenous contrast. Multiplanar CT image reconstructions and MIPs were obtained to evaluate the vascular anatomy. CONTRAST:  158mL OMNIPAQUE IOHEXOL 350 MG/ML SOLN COMPARISON:  11/17/2017 FINDINGS: Cardiovascular: This is a technically adequate evaluation of the pulmonary vasculature. No filling defects or pulmonary emboli. The heart is unremarkable without pericardial effusion. Normal caliber of the thoracic aorta, with  mild atherosclerosis. Mediastinum/Nodes: No enlarged mediastinal, hilar, or axillary lymph nodes. Thyroid  gland, trachea, and esophagus demonstrate no significant findings. Lungs/Pleura: No acute airspace disease, effusion, or pneumothorax. Central airways are patent. Upper Abdomen: No acute abnormality. Musculoskeletal: No acute or destructive bony lesions. Reconstructed images demonstrate no additional findings. Review of the MIP images confirms the above findings. IMPRESSION: 1. No evidence of pulmonary embolus. 2. No acute intrathoracic process. 3.  Aortic Atherosclerosis (ICD10-I70.0). Electronically Signed   By: Randa Ngo M.D.   On: 11/13/2020 21:12   VAS [place] LOWER EXTREMITY VENOUS (DVT) (ONLY [facility] & [facility])  Result Date: 11/13/2020  Lower Venous DVT Study Indications: History of DVT/PE, pain and swelling of LT calf.  Anticoagulation: Xarelto, switched from Coumadin approximately one year ago. Comparison Study: No recent prior studies available. Performing Technologist: Darlin Coco RDMS,RVT  Examination Guidelines: A complete evaluation includes B-mode imaging, spectral Doppler, color Doppler, and power Doppler as needed of all accessible portions of each vessel. Bilateral testing is considered an integral part of a complete examination. Limited examinations for reoccurring indications may be performed as noted. The reflux portion of the exam is performed with the patient in reverse Trendelenburg.  +-----+---------------+---------+-----------+----------+--------------+ RIGHTCompressibilityPhasicitySpontaneityPropertiesThrombus Aging +-----+---------------+---------+-----------+----------+--------------+ CFV  Full           Yes      Yes                                 +-----+---------------+---------+-----------+----------+--------------+   +---------+---------------+---------+-----------+---------------+--------------+ LEFT     CompressibilityPhasicitySpontaneityProperties     Thrombus  Aging +---------+---------------+---------+-----------+---------------+--------------+ CFV      Partial        Yes      Yes        Fibrin         Chronic                                                    stranding                     +---------+---------------+---------+-----------+---------------+--------------+ SFJ      Partial        Yes      Yes        Fibrin         Chronic                                                    stranding                     +---------+---------------+---------+-----------+---------------+--------------+ FV Prox  Full                                                             +---------+---------------+---------+-----------+---------------+--------------+ FV Mid   Full                                                             +---------+---------------+---------+-----------+---------------+--------------+  FV DistalNone           No       No                        Acute          +---------+---------------+---------+-----------+---------------+--------------+ PFV      Full                                                             +---------+---------------+---------+-----------+---------------+--------------+ POP      None           No       No                        Acute          +---------+---------------+---------+-----------+---------------+--------------+ PTV      Full                                                             +---------+---------------+---------+-----------+---------------+--------------+ PERO     Full                                                             +---------+---------------+---------+-----------+---------------+--------------+ Gastroc  None           No       No                        Acute          +---------+---------------+---------+-----------+---------------+--------------+     Summary: RIGHT: - No evidence of common femoral vein obstruction.  LEFT: -  Findings consistent with acute deep vein thrombosis involving the left femoral vein, left popliteal vein, and left gastrocnemius veins. - Findings consistent with chronic deep vein thrombosis involving the left common femoral vein, and SF junction. - No cystic structure found in the popliteal fossa.  *See table(s) above for measurements and observations.    Preliminary     EKG: Independently reviewed. Sinus rhythm, non-specific IVCD, repolarization abnormality.   Assessment/Plan   1. Acute LLE DVT  - Patient with hx of recurrent DVT and PE on Xarelto presents with 2-3 days of left leg tenderness and swelling and is found to have acute DVT  - No phlegmasia; CTA chest negative for PE  - ED PA discussed with hematology and admission with IV heparin then transition to Lovenox recommended  - Continue IV heparin for now   2. Depression, anxiety  - Stable, continue Lexapro and as-needed Xanax    3. RLS  - Continue Requip    DVT prophylaxis: IV heparin  Code Status: Full   Level of Care: Level of care: Med-Surg Family Communication: None present  Disposition Plan:  Patient is from: home  Anticipated d/c is to: Home  Anticipated d/c date is: 4/16 or 11/15/20 Patient currently:  Pending transition off of IV heparin  Consults called: None  Admission status: Observation     Vianne Bulls, MD [facility]  11/14/2020, 12:25 AM

## 2020-11-13 NOTE — ED Provider Notes (Addendum)
[facility] DEPT Provider Note   CSN: [id number] Arrival date & time: 11/13/20  1630     History Chief Complaint  Patient presents with  . Leg Swelling    Emma Lopez is a 75 y.o. female.  HPI 75 year old female with prior history of DVT, PE, on Xarelto with 1 day of left calf swelling.  Patient states that she was originally on Coumadin for many years, but switched to Xarelto about a year ago.  Was doing well on it, however noticed some left calf swelling and pain over the last couple days.  She states that she is fairly short of breath at baseline, and does not feel any more short of breath than normal.  She denies any chest pain.  Denies any fevers or chills.  No cough.  No known falls or injuries to the left leg.  Denies any numbness or tingling.    Past Medical History:  Diagnosis Date  . Abnormal EKG   . Anxiety   . Arthritis    oa  . Bradycardia 12/14/2017  . CIN III (cervical intraepithelial neoplasia III) 2000  . Complication of anesthesia    did well last 2 times with procedures  . Depression   . 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 DVT (deep venous thrombosis) ([place]) 01/2009   LEFT LEG  . Dyspnea on exertion 10/2017  . Elevated triglycerides with high cholesterol   . Endometriosis   . Fibromyalgia   . GERD (gastroesophageal reflux disease)   . History of colon polyps   . History of hiatal hernia    told by some md has, some say not  . IBS (irritable bowel syndrome) 2015  . Multinodular goiter   . Osteoporosis 12/2017   T score -3.2 stable from prior study  . PE (pulmonary embolism)   . PONV (postoperative nausea and vomiting)   . Restless legs syndrome   . Sleep disorder   . Thyroid disease    HYPERTHYROIDISM  . Vitamin D deficiency     Patient Active Problem List   Diagnosis Date Noted  . RLS (restless legs syndrome) 01/07/2020  . Insomnia due to medical condition 01/07/2020  . Nocturia more than twice per night 01/07/2020  .  Bradycardia 12/14/2017  . Abnormal EKG   . GERD (gastroesophageal reflux disease)   . Dyspnea on exertion 10/30/2017  . Family history of ovarian cancer 04/17/2017  . Family history of breast cancer 04/17/2017  . Family history of colon cancer 04/17/2017  . Family history of pancreatic cancer 04/17/2017  . Family history of melanoma 04/17/2017  . Genetic testing 04/17/2017  . CIN III (cervical intraepithelial neoplasia III)   . Arthritis   . Fibromyalgia   . PE (pulmonary embolism)   . Thyroid disease   . Osteoporosis   . DVT (deep venous thrombosis) ([place]) 04/01/2004    Past Surgical History:  Procedure Laterality Date  . ABDOMINAL HYSTERECTOMY  2001   TAH, partial  . APPENDECTOMY  1978  . COLONOSCOPY WITH PROPOFOL N/A 12/07/2015   Procedure: COLONOSCOPY WITH PROPOFOL;  Surgeon: Garlan Fair, MD;  Location: [facility];  Service: Endoscopy;  Laterality: N/A;  . colonscopy  7 yrs ago   other in past  . ESOPHAGOGASTRODUODENOSCOPY (EGD) WITH PROPOFOL N/A 12/07/2015   Procedure: ESOPHAGOGASTRODUODENOSCOPY (EGD) WITH PROPOFOL;  Surgeon: Garlan Fair, MD;  Location: [facility];  Service: Endoscopy;  Laterality: N/A;  . ESOPHAGOGASTRODUODENOSCOPY ENDOSCOPY  yrs ago  . FACIAL COSMETIC  SURGERY    . GYNECOLOGIC CRYOSURGERY    . MYOMECTOMY    . TONSILLECTOMY       OB History    Gravida  0   Para  0   Term      Preterm      AB      Living        SAB      IAB      Ectopic      Multiple      Live Births              Family History  Problem Relation Age of Onset  . Hypertension Mother   . Breast cancer Mother        25's  . Cancer Father        COLON  . Hypertension Sister   . Stroke Sister   . Breast cancer Maternal Aunt        Age 10's  . Cancer Maternal Aunt        Melanoma  . Alzheimer's disease Maternal Aunt   . Cancer Paternal Aunt        OVARIAN and COLON  . Breast cancer Maternal Aunt        70's  . Cancer Maternal Aunt        Colon  CA  . Alzheimer's disease Maternal Aunt     Social History   Tobacco Use  . Smoking status: Former Smoker    Packs/day: 0.10    Years: 6.00    Pack years: 0.60    Types: Cigarettes    Quit date: 08/01/1978    Years since quitting: 42.3  . Smokeless tobacco: Never Used  . Tobacco comment: only smoked 4-6 yrs 1 pack per month  Vaping Use  . Vaping Use: Never used  Substance Use Topics  . Alcohol use: Not Currently    Alcohol/week: 0.0 standard drinks  . Drug use: No    Home Medications Prior to Admission medications   Medication Sig Start Date End Date Taking? Authorizing Provider  acetaminophen (TYLENOL) 325 MG tablet Take 650 mg by mouth every 6 (six) hours as needed for moderate pain.   Yes [provider]  ALPRAZolam (XANAX) 0.25 MG tablet Take 1 tablet (0.25 mg total) by mouth every 8 (eight) hours as needed for anxiety. Patient taking differently: Take 0.125-0.25 mg by mouth daily as needed for anxiety. 04/05/17  Yes Huel Cote, NP  B Complex Vitamins (B COMPLEX 1 PO) Take 1 each by mouth 2 (two) times daily. sublingual   Yes [provider]  Calcium Carbonate+Vitamin D 600-200 MG-UNIT TABS Take 1 tablet by mouth daily. 12/18/19  Yes [provider]  denosumab (PROLIA) 60 MG/ML SOLN injection Inject 60 mg into the skin every 6 (six) months. Administer in upper arm, thigh, or abdomen 03/05/15  Yes Young, Candiss Norse, NP  dicyclomine (BENTYL) 20 MG tablet Take 20 mg by mouth 3 (three) times daily as needed for spasms.   Yes [provider]  escitalopram (LEXAPRO) 20 MG tablet Take 20 mg by mouth daily.   Yes [provider]  estradiol (ESTRACE) 0.1 MG/GM vaginal cream Apply small amount topically to affected are x 2-3 weeks Patient taking differently: Place 1 Applicatorful vaginally See admin instructions. Apply small amount topically to affected area 2-3 times a day x 2-3 weeks 11/05/20  Yes Karma Ganja, NP  Multiple Vitamins-Minerals  (MULTIVITAMIN PO) Take 1 tablet by mouth 2 (two)  times daily.    Yes [provider]  rOPINIRole (REQUIP) 1 MG tablet Take 1 tablet (1 mg total) by mouth at bedtime. 01/13/20  Yes Dohmeier, Asencion Partridge, MD  XARELTO 10 MG TABS tablet Take 10 mg by mouth daily. 06/16/20  Yes [provider]    Allergies    Sulfa antibiotics  Review of Systems   Review of Systems  Constitutional: Negative for chills and fever.  HENT: Negative for ear pain and sore throat.   Eyes: Negative for pain and visual disturbance.  Respiratory: Negative for cough and shortness of breath.   Cardiovascular: Positive for leg swelling. Negative for chest pain and palpitations.  Gastrointestinal: Negative for abdominal pain and vomiting.  Genitourinary: Negative for dysuria and hematuria.  Musculoskeletal: Negative for arthralgias and back pain.  Skin: Positive for color change. Negative for rash.  Neurological: Negative for seizures and syncope.  All other systems reviewed and are negative.   Physical Exam Updated Vital Signs BP (!) 171/65   Pulse (!) 55   Temp 98.5 F (36.9 C) (Oral)   Resp 17   Ht 5\' 2"  (1.575 m)   Wt 61.2 kg   SpO2 98%   BMI 24.69 kg/m   Physical Exam Vitals and nursing note reviewed.  Constitutional:      General: She is not in acute distress.    Appearance: She is well-developed.  HENT:     Head: Normocephalic and atraumatic.  Eyes:     Conjunctiva/sclera: Conjunctivae normal.  Cardiovascular:     Rate and Rhythm: Normal rate and regular rhythm.     Heart sounds: No murmur heard.   Pulmonary:     Effort: Pulmonary effort is normal. No respiratory distress.     Breath sounds: Normal breath sounds.  Abdominal:     Palpations: Abdomen is soft.     Tenderness: There is no abdominal tenderness.  Musculoskeletal:        General: Swelling and tenderness present.     Cervical back: Neck supple.     Comments: LLE swollen, warm to the touch , 2+ DP pulses  Skin:     General: Skin is warm and dry.  Neurological:     General: No focal deficit present.     Mental Status: She is alert and oriented to person, place, and time.     Sensory: No sensory deficit.     Motor: No weakness.  Psychiatric:        Mood and Affect: Mood normal.        Behavior: Behavior normal.     ED Results / Procedures / Treatments   Labs (all labs ordered are listed, but only abnormal results are displayed) Labs Reviewed  BASIC METABOLIC PANEL - Abnormal; Notable for the following components:      Result Value   Glucose, Bld 106 (*)    All other components within normal limits  RESP PANEL BY RT-PCR (FLU A&B, COVID) ARPGX2  CBC  PROTIME-INR  APTT  HEPARIN LEVEL (UNFRACTIONATED)    EKG EKG Interpretation  Date/Time:  Friday November 13 2020 17:59:55 EDT Ventricular Rate:  55 PR Interval:  142 QRS Duration: 122 QT Interval:  396 QTC Calculation: 379 R Axis:   60 Text Interpretation: Sinus rhythm Nonspecific intraventricular conduction delay Repol abnrm suggests ischemia, inferior leads Minimal ST elevation, lateral leads 12 Lead; [place] similar when compared to prior 12/08/17 Artifact Confirmed by Quintella Reichert [phone]) on 11/13/2020 6:05:55 PM   Radiology CT Angio Chest  PE W and/or Wo Contrast  Result Date: 11/13/2020 CLINICAL DATA:  Left leg pain and swelling for 2 days, history of DVT EXAM: CT ANGIOGRAPHY CHEST WITH CONTRAST TECHNIQUE: Multidetector CT imaging of the chest was performed using the standard protocol during bolus administration of intravenous contrast. Multiplanar CT image reconstructions and MIPs were obtained to evaluate the vascular anatomy. CONTRAST:  181mL OMNIPAQUE IOHEXOL 350 MG/ML SOLN COMPARISON:  11/17/2017 FINDINGS: Cardiovascular: This is a technically adequate evaluation of the pulmonary vasculature. No filling defects or pulmonary emboli. The heart is unremarkable without pericardial effusion. Normal caliber of the thoracic aorta, with  mild atherosclerosis. Mediastinum/Nodes: No enlarged mediastinal, hilar, or axillary lymph nodes. Thyroid gland, trachea, and esophagus demonstrate no significant findings. Lungs/Pleura: No acute airspace disease, effusion, or pneumothorax. Central airways are patent. Upper Abdomen: No acute abnormality. Musculoskeletal: No acute or destructive bony lesions. Reconstructed images demonstrate no additional findings. Review of the MIP images confirms the above findings. IMPRESSION: 1. No evidence of pulmonary embolus. 2. No acute intrathoracic process. 3.  Aortic Atherosclerosis (ICD10-I70.0). Electronically Signed   By: Randa Ngo M.D.   On: 11/13/2020 21:12   VAS [place] LOWER EXTREMITY VENOUS (DVT) (ONLY [facility] & [facility])  Result Date: 11/13/2020  Lower Venous DVT Study Indications: History of DVT/PE, pain and swelling of LT calf.  Anticoagulation: Xarelto, switched from Coumadin approximately one year ago. Comparison Study: No recent prior studies available. Performing Technologist: Darlin Coco RDMS,RVT  Examination Guidelines: A complete evaluation includes B-mode imaging, spectral Doppler, color Doppler, and power Doppler as needed of all accessible portions of each vessel. Bilateral testing is considered an integral part of a complete examination. Limited examinations for reoccurring indications may be performed as noted. The reflux portion of the exam is performed with the patient in reverse Trendelenburg.  +-----+---------------+---------+-----------+----------+--------------+ RIGHTCompressibilityPhasicitySpontaneityPropertiesThrombus Aging +-----+---------------+---------+-----------+----------+--------------+ CFV  Full           Yes      Yes                                 +-----+---------------+---------+-----------+----------+--------------+   +---------+---------------+---------+-----------+---------------+--------------+ LEFT     CompressibilityPhasicitySpontaneityProperties     Thrombus  Aging +---------+---------------+---------+-----------+---------------+--------------+ CFV      Partial        Yes      Yes        Fibrin         Chronic                                                    stranding                     +---------+---------------+---------+-----------+---------------+--------------+ SFJ      Partial        Yes      Yes        Fibrin         Chronic                                                    stranding                     +---------+---------------+---------+-----------+---------------+--------------+  FV Prox  Full                                                             +---------+---------------+---------+-----------+---------------+--------------+ FV Mid   Full                                                             +---------+---------------+---------+-----------+---------------+--------------+ FV DistalNone           No       No                        Acute          +---------+---------------+---------+-----------+---------------+--------------+ PFV      Full                                                             +---------+---------------+---------+-----------+---------------+--------------+ POP      None           No       No                        Acute          +---------+---------------+---------+-----------+---------------+--------------+ PTV      Full                                                             +---------+---------------+---------+-----------+---------------+--------------+ PERO     Full                                                             +---------+---------------+---------+-----------+---------------+--------------+ Gastroc  None           No       No                        Acute          +---------+---------------+---------+-----------+---------------+--------------+     Summary: RIGHT: - No evidence of common femoral vein obstruction.  LEFT: -  Findings consistent with acute deep vein thrombosis involving the left femoral vein, left popliteal vein, and left gastrocnemius veins. - Findings consistent with chronic deep vein thrombosis involving the left common femoral vein, and SF junction. - No cystic structure found in the popliteal fossa.  *See table(s) above for measurements and observations.    Preliminary     Procedures Procedures   Medications Ordered in ED Medications  iohexol (OMNIPAQUE) 350 MG/ML injection 100 mL (100 mLs  Intravenous Contrast Given 11/13/20 2049)    ED Course  I have reviewed the triage vital signs and the nursing notes.  Pertinent labs & imaging results that were available during my care of the patient were reviewed by me and considered in my medical decision making (see chart for details).    MDM Rules/Calculators/A&P                           75 year old female with prior history of DVT and PE presents with left leg swelling.  Vitals on arrival overall reassuring, though she is slightly hypertensive.  She does endorse some mild shortness of breath, though not significantly more than baseline.  She does have visible swelling to her left calf.  DVT study positive for acute deep vein thrombosis involving the left femoral vein, left popliteal vein, and left gastrocnemius veins.   Spoke with Dr. Marin Olp with hematology given the patient is already on Xarelto, he recommends admission for heparin and Lovenox bridge.  CT PE study ordered given slightly increases in shortness of breath and prior history of PE.  CBC and BMP largely unremarkable.  Covid test is negative.  PE study negative.  Plan to admit.  Patient informed of the findings and the plan and is agreeable.  Consulted hospitalist team for admission.  Spoke with Dr. Myna Hidalgo who will admit the patient for further evaluation and treatment.  This was a shared visit with my supervising physician Dr. Ralene Bathe who independently saw and evaluated the patient &  provided guidance in evaluation/management/disposition ,in agreement with care  Final Clinical Impression(s) / ED Diagnoses Final diagnoses:  Acute deep vein thrombosis (DVT) of proximal vein of left lower extremity [facility])    Rx / DC Orders ED Discharge Orders    None       Lyndel Safe 11/13/20 2144    Quintella Reichert, MD 11/13/20 2234    Garald Balding, PA-C 11/24/20 1704    Quintella Reichert, MD 11/30/20 [phone]

## 2020-11-14 DIAGNOSIS — I700 Atherosclerosis of aorta: Secondary | ICD-10-CM | POA: Diagnosis present

## 2020-11-14 DIAGNOSIS — R0789 Other chest pain: Secondary | ICD-10-CM | POA: Diagnosis present

## 2020-11-14 DIAGNOSIS — Z820 Family history of epilepsy and other diseases of the nervous system: Secondary | ICD-10-CM | POA: Diagnosis not present

## 2020-11-14 DIAGNOSIS — F32A Depression, unspecified: Secondary | ICD-10-CM | POA: Diagnosis present

## 2020-11-14 DIAGNOSIS — Z86711 Personal history of pulmonary embolism: Secondary | ICD-10-CM | POA: Diagnosis not present

## 2020-11-14 DIAGNOSIS — Z8249 Family history of ischemic heart disease and other diseases of the circulatory system: Secondary | ICD-10-CM | POA: Diagnosis not present

## 2020-11-14 DIAGNOSIS — Z86001 Personal history of in-situ neoplasm of cervix uteri: Secondary | ICD-10-CM | POA: Diagnosis not present

## 2020-11-14 DIAGNOSIS — G2581 Restless legs syndrome: Secondary | ICD-10-CM | POA: Diagnosis present

## 2020-11-14 DIAGNOSIS — M1990 Unspecified osteoarthritis, unspecified site: Secondary | ICD-10-CM | POA: Diagnosis present

## 2020-11-14 DIAGNOSIS — I82402 Acute embolism and thrombosis of unspecified deep veins of left lower extremity: Secondary | ICD-10-CM | POA: Diagnosis not present

## 2020-11-14 DIAGNOSIS — Z79899 Other long term (current) drug therapy: Secondary | ICD-10-CM | POA: Diagnosis not present

## 2020-11-14 DIAGNOSIS — Z800 Family history of malignant neoplasm of digestive organs: Secondary | ICD-10-CM | POA: Diagnosis not present

## 2020-11-14 DIAGNOSIS — Z832 Family history of diseases of the blood and blood-forming organs and certain disorders involving the immune mechanism: Secondary | ICD-10-CM | POA: Diagnosis not present

## 2020-11-14 DIAGNOSIS — I824Y2 Acute embolism and thrombosis of unspecified deep veins of left proximal lower extremity: Secondary | ICD-10-CM | POA: Diagnosis present

## 2020-11-14 DIAGNOSIS — F419 Anxiety disorder, unspecified: Secondary | ICD-10-CM | POA: Diagnosis present

## 2020-11-14 DIAGNOSIS — M797 Fibromyalgia: Secondary | ICD-10-CM | POA: Diagnosis present

## 2020-11-14 DIAGNOSIS — M7989 Other specified soft tissue disorders: Secondary | ICD-10-CM | POA: Diagnosis present

## 2020-11-14 DIAGNOSIS — Z808 Family history of malignant neoplasm of other organs or systems: Secondary | ICD-10-CM | POA: Diagnosis not present

## 2020-11-14 DIAGNOSIS — E0520 Thyrotoxicosis with toxic multinodular goiter without thyrotoxic crisis or storm: Secondary | ICD-10-CM | POA: Diagnosis present

## 2020-11-14 DIAGNOSIS — M810 Age-related osteoporosis without current pathological fracture: Secondary | ICD-10-CM | POA: Diagnosis present

## 2020-11-14 DIAGNOSIS — I82512 Chronic embolism and thrombosis of left femoral vein: Secondary | ICD-10-CM | POA: Diagnosis not present

## 2020-11-14 DIAGNOSIS — R079 Chest pain, unspecified: Secondary | ICD-10-CM | POA: Diagnosis not present

## 2020-11-14 DIAGNOSIS — I82562 Chronic embolism and thrombosis of left calf muscular vein: Secondary | ICD-10-CM | POA: Diagnosis not present

## 2020-11-14 DIAGNOSIS — I824Z2 Acute embolism and thrombosis of unspecified deep veins of left distal lower extremity: Secondary | ICD-10-CM | POA: Diagnosis not present

## 2020-11-14 DIAGNOSIS — I82412 Acute embolism and thrombosis of left femoral vein: Secondary | ICD-10-CM | POA: Diagnosis present

## 2020-11-14 DIAGNOSIS — Z20822 Contact with and (suspected) exposure to covid-19: Secondary | ICD-10-CM | POA: Diagnosis present

## 2020-11-14 DIAGNOSIS — F418 Other specified anxiety disorders: Secondary | ICD-10-CM | POA: Diagnosis not present

## 2020-11-14 DIAGNOSIS — Z7901 Long term (current) use of anticoagulants: Secondary | ICD-10-CM | POA: Diagnosis not present

## 2020-11-14 DIAGNOSIS — Z803 Family history of malignant neoplasm of breast: Secondary | ICD-10-CM | POA: Diagnosis not present

## 2020-11-14 DIAGNOSIS — Z823 Family history of stroke: Secondary | ICD-10-CM | POA: Diagnosis not present

## 2020-11-14 DIAGNOSIS — Z8041 Family history of malignant neoplasm of ovary: Secondary | ICD-10-CM | POA: Diagnosis not present

## 2020-11-14 DIAGNOSIS — I82532 Chronic embolism and thrombosis of left popliteal vein: Secondary | ICD-10-CM | POA: Diagnosis not present

## 2020-11-14 DIAGNOSIS — K219 Gastro-esophageal reflux disease without esophagitis: Secondary | ICD-10-CM | POA: Diagnosis present

## 2020-11-14 LAB — BASIC METABOLIC PANEL
Anion gap: 7 (ref 5–15)
BUN: 13 mg/dL (ref 8–23)
CO2: 25 mmol/L (ref 22–32)
Calcium: 8.3 mg/dL — ABNORMAL LOW (ref 8.9–10.3)
Chloride: 108 mmol/L (ref 98–111)
Creatinine, Ser: 0.65 mg/dL (ref 0.44–1.00)
GFR, Estimated: >60 mL/min (ref >60)
Glucose, Bld: 103 mg/dL — ABNORMAL HIGH (ref 70–99)
Potassium: 4 mmol/L (ref 3.5–5.1)
Sodium: 140 mmol/L (ref 135–145)

## 2020-11-14 LAB — CBC
HCT: 40.7 % (ref 36.0–46.0)
Hemoglobin: 13.3 g/dL (ref 12.0–15.0)
MCH: 31.7 pg (ref 26.0–34.0)
MCHC: 32.7 g/dL (ref 30.0–36.0)
MCV: 96.9 fL (ref 80.0–100.0)
Platelets: 199 10*3/uL (ref 150–400)
RBC: 4.2 MIL/uL (ref 3.87–5.11)
RDW: 13.4 % (ref 11.5–15.5)
WBC: 8.3 10*3/uL (ref 4.0–10.5)
nRBC: 0 % (ref 0.0–0.2)

## 2020-11-14 LAB — TROPONIN I (HIGH SENSITIVITY)
Troponin I (High Sensitivity): 8 ng/L (ref <18)
Troponin I (High Sensitivity): 6 ng/L (ref <18)

## 2020-11-14 LAB — HEPARIN LEVEL (UNFRACTIONATED)
Heparin Unfractionated: 0.51 IU/mL (ref 0.30–0.70)
Heparin Unfractionated: 0.59 IU/mL (ref 0.30–0.70)

## 2020-11-14 LAB — APTT: aPTT: 49 seconds — ABNORMAL HIGH (ref 24–36)

## 2020-11-14 MED ORDER — ASPIRIN 325 MG PO TABS: 325.0000 mg | ORAL_TABLET | Freq: Every day | ORAL | Status: DC

## 2020-11-14 MED ORDER — NITROGLYCERIN 0.4 MG SL SUBL
0.4000 mg | SUBLINGUAL_TABLET | SUBLINGUAL | Status: DC | PRN
  Administered 2020-11-14: 0.4 mg via SUBLINGUAL
  Filled 2020-11-14: qty 1

## 2020-11-14 MED ORDER — LIP MEDEX EX OINT
TOPICAL_OINTMENT | CUTANEOUS | Status: AC
  Filled 2020-11-14: qty 7

## 2020-11-14 MED ORDER — ASPIRIN 325 MG PO TABS
325.0000 mg | ORAL_TABLET | Freq: Once | ORAL | Status: AC
  Administered 2020-11-14: 325 mg via ORAL
  Filled 2020-11-14: qty 1

## 2020-11-14 MED ORDER — PANTOPRAZOLE SODIUM 40 MG PO TBEC
40.0000 mg | DELAYED_RELEASE_TABLET | Freq: Once | ORAL | Status: AC
  Administered 2020-11-14: 40 mg via ORAL
  Filled 2020-11-14: qty 1

## 2020-11-14 NOTE — ED Notes (Signed)
ED TO INPATIENT HANDOFF REPORT  Name/Age/Gender Emma Lopez 75 y.o. female  Code Status    Code Status Orders  (From admission, onward)         Start     Ordered   11/13/20 2337  Full code  Continuous        11/13/20 2337        Code Status History    This patient has a current code status but no historical code status.   Advance Care Planning Activity    Advance Directive Documentation   Flowsheet Row Most Recent Value  Type of Advance Directive Healthcare Power of Attorney, Living will  Pre-existing out of facility DNR order (yellow form or pink MOST form) --  "MOST" Form in Place? --      Home/SNF/Other home  Chief Complaint Acute DVT (deep venous thrombosis) ([place]) [I82.409]  Level of Care/Admitting Diagnosis ED Disposition    ED Disposition Condition [place]: [place] [[id number]]  Level of Care: Med-Surg [16]  Covid Evaluation: Confirmed COVID Negative  Diagnosis: Acute DVT (deep venous thrombosis) [facility]) [[id number]]  Admitting Physician: Vianne Bulls [[id number]]  Attending Physician: Vianne Bulls [[id number]]       Medical History Past Medical History:  Diagnosis Date  . Abnormal EKG   . Anxiety   . Arthritis    oa  . Bradycardia 12/14/2017  . CIN III (cervical intraepithelial neoplasia III) 2000  . Complication of anesthesia    did well last 2 times with procedures  . Depression   . 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 DVT (deep venous thrombosis) ([place]) 01/2009   LEFT LEG  . Dyspnea on exertion 10/2017  . Elevated triglycerides with high cholesterol   . Endometriosis   . Fibromyalgia   . GERD (gastroesophageal reflux disease)   . History of colon polyps   . History of hiatal hernia    told by some md has, some say not  . IBS (irritable bowel syndrome) 2015  . Multinodular goiter   . Osteoporosis 12/2017   T score -3.2 stable from prior study  . PE (pulmonary embolism)   . PONV (postoperative nausea and  vomiting)   . Restless legs syndrome   . Sleep disorder   . Thyroid disease    HYPERTHYROIDISM  . Vitamin D deficiency     Allergies Allergies  Allergen Reactions  . Sulfa Antibiotics Rash    IV Location/Drains/Wounds Patient Lines/Drains/Airways Status    Active Line/Drains/Airways    Name Placement date Placement time Site Days   Peripheral IV 11/13/20 Left;Anterior Antecubital 11/13/20  2048  Antecubital  1   Peripheral IV 11/13/20 Right Antecubital 11/13/20  2200  Antecubital  1          Labs/Imaging Results for orders placed or performed during the hospital encounter of 11/13/20 (from the past 48 hour(s))  CBC     Status: None   Collection Time: 11/13/20  4:53 PM  Result Value Ref Range   WBC 9.2 4.0 - 10.5 K/uL   RBC 4.74 3.87 - 5.11 MIL/uL   Hemoglobin 14.9 12.0 - 15.0 g/dL   HCT 45.6 36.0 - 46.0 %   MCV 96.2 80.0 - 100.0 fL   MCH 31.4 26.0 - 34.0 pg   MCHC 32.7 30.0 - 36.0 g/dL   RDW 13.2 11.5 - 15.5 %   Platelets 225 150 - 400 K/uL   nRBC 0.0 0.0 - 0.2 %  Comment: Performed at [facility], [place] [address] [place], [place] [address]  Basic metabolic panel     Status: Abnormal   Collection Time: 11/13/20  4:53 PM  Result Value Ref Range   Sodium 142 135 - 145 mmol/L   Potassium 3.7 3.5 - 5.1 mmol/L   Chloride 107 98 - 111 mmol/L   CO2 28 22 - 32 mmol/L   Glucose, Bld 106 (H) 70 - 99 mg/dL    Comment: Glucose reference range applies only to samples taken after fasting for at least 8 hours.   BUN 19 8 - 23 mg/dL   Creatinine, Ser 0.81 0.44 - 1.00 mg/dL   Calcium 9.2 8.9 - 10.3 mg/dL   GFR, Estimated >60 >60 mL/min    Comment: (NOTE) Calculated using the CKD-EPI Creatinine Equation (2021)    Anion gap 7 5 - 15    Comment: Performed at [facility], [place] [address] [place], [place] [address]  Resp Panel by RT-PCR (Flu A&B, Covid) Nasopharyngeal Swab     Status: None   Collection Time: 11/13/20  7:08 PM    Specimen: Nasopharyngeal Swab; Nasopharyngeal(NP) swabs in vial transport medium  Result Value Ref Range   SARS Coronavirus 2 by RT PCR NEGATIVE NEGATIVE    Comment: (NOTE) SARS-CoV-2 target nucleic acids are NOT DETECTED.  The SARS-CoV-2 RNA is generally detectable in upper respiratory specimens during the acute phase of infection. The lowest concentration of SARS-CoV-2 viral copies this assay can detect is 138 copies/mL. A negative result does not preclude SARS-Cov-2 infection and should not be used as the sole basis for treatment or other patient management decisions. A negative result may occur with  improper specimen collection/handling, submission of specimen other than nasopharyngeal swab, presence of viral mutation(s) within the areas targeted by this assay, and inadequate number of viral copies(<138 copies/mL). A negative result must be combined with clinical observations, patient history, and epidemiological information. The expected result is Negative.  Fact Sheet for Patients:  EntrepreneurPulse.com.au  Fact Sheet for Healthcare Providers:  IncredibleEmployment.be  This test is no t yet approved or cleared by the [place] FDA and  has been authorized for detection and/or diagnosis of SARS-CoV-2 by FDA under an Emergency Use Authorization (EUA). This EUA will remain  in effect (meaning this test can be used) for the duration of the COVID-19 declaration under Section 564(b)(1) of the Act, 21 U.S.C.section 360bbb-3(b)(1), unless the authorization is terminated  or revoked sooner.       Influenza A by PCR NEGATIVE NEGATIVE   Influenza B by PCR NEGATIVE NEGATIVE    Comment: (NOTE) The Xpert Xpress SARS-CoV-2/FLU/RSV plus assay is intended as an aid in the diagnosis of influenza from Nasopharyngeal swab specimens and should not be used as a sole basis for treatment. Nasal washings and aspirates are unacceptable for Xpert Xpress  SARS-CoV-2/FLU/RSV testing.  Fact Sheet for Patients: EntrepreneurPulse.com.au  Fact Sheet for Healthcare Providers: IncredibleEmployment.be  This test is not yet approved or cleared by the [place] FDA and has been authorized for detection and/or diagnosis of SARS-CoV-2 by FDA under an Emergency Use Authorization (EUA). This EUA will remain in effect (meaning this test can be used) for the duration of the COVID-19 declaration under Section 564(b)(1) of the Act, 21 U.S.C. section 360bbb-3(b)(1), unless the authorization is terminated or revoked.  Performed at [facility], [place] [address] [place], [place] [address]   Protime-INR     Status: None  Collection Time: 11/13/20  9:09 PM  Result Value Ref Range   Prothrombin Time 13.1 11.4 - 15.2 seconds   INR 1.0 0.8 - 1.2    Comment: (NOTE) INR goal varies based on device and disease states. Performed at [facility], [place] [address], [place], [place] [address]   APTT     Status: None   Collection Time: 11/13/20  9:09 PM  Result Value Ref Range   aPTT 24 24 - 36 seconds    Comment: Performed at [facility], [place] [address] [place], [place] [address]  Heparin level (unfractionated)     Status: Abnormal   Collection Time: 11/13/20  9:09 PM  Result Value Ref Range   Heparin Unfractionated 0.14 (L) 0.30 - 0.70 IU/mL    Comment: (NOTE) The clinical reportable range upper limit is being lowered to >1.10 to align with the FDA approved guidance for the current laboratory assay.  If heparin results are below expected values, and patient dosage has  been confirmed, suggest follow up testing of antithrombin III levels. Performed at [facility], [place] [address], [place], [place] [address]    CT Angio Chest PE W and/or Wo Contrast  Result Date: 11/13/2020 CLINICAL DATA:  Left leg pain and swelling for 2 days, history  of DVT EXAM: CT ANGIOGRAPHY CHEST WITH CONTRAST TECHNIQUE: Multidetector CT imaging of the chest was performed using the standard protocol during bolus administration of intravenous contrast. Multiplanar CT image reconstructions and MIPs were obtained to evaluate the vascular anatomy. CONTRAST:  128mL OMNIPAQUE IOHEXOL 350 MG/ML SOLN COMPARISON:  11/17/2017 FINDINGS: Cardiovascular: This is a technically adequate evaluation of the pulmonary vasculature. No filling defects or pulmonary emboli. The heart is unremarkable without pericardial effusion. Normal caliber of the thoracic aorta, with mild atherosclerosis. Mediastinum/Nodes: No enlarged mediastinal, hilar, or axillary lymph nodes. Thyroid gland, trachea, and esophagus demonstrate no significant findings. Lungs/Pleura: No acute airspace disease, effusion, or pneumothorax. Central airways are patent. Upper Abdomen: No acute abnormality. Musculoskeletal: No acute or destructive bony lesions. Reconstructed images demonstrate no additional findings. Review of the MIP images confirms the above findings. IMPRESSION: 1. No evidence of pulmonary embolus. 2. No acute intrathoracic process. 3.  Aortic Atherosclerosis (ICD10-I70.0). Electronically Signed   By: Randa Ngo M.D.   On: 11/13/2020 21:12   VAS [place] LOWER EXTREMITY VENOUS (DVT) (ONLY [facility] & [facility])  Result Date: 11/13/2020  Lower Venous DVT Study Indications: History of DVT/PE, pain and swelling of LT calf.  Anticoagulation: Xarelto, switched from Coumadin approximately one year ago. Comparison Study: No recent prior studies available. Performing Technologist: Darlin Coco RDMS,RVT  Examination Guidelines: A complete evaluation includes B-mode imaging, spectral Doppler, color Doppler, and power Doppler as needed of all accessible portions of each vessel. Bilateral testing is considered an integral part of a complete examination. Limited examinations for reoccurring indications may be performed as noted. The  reflux portion of the exam is performed with the patient in reverse Trendelenburg.  +-----+---------------+---------+-----------+----------+--------------+ RIGHTCompressibilityPhasicitySpontaneityPropertiesThrombus Aging +-----+---------------+---------+-----------+----------+--------------+ CFV  Full           Yes      Yes                                 +-----+---------------+---------+-----------+----------+--------------+   +---------+---------------+---------+-----------+---------------+--------------+ LEFT     CompressibilityPhasicitySpontaneityProperties     Thrombus Aging +---------+---------------+---------+-----------+---------------+--------------+ CFV      Partial  Yes      Yes        Fibrin         Chronic                                                    stranding                     +---------+---------------+---------+-----------+---------------+--------------+ SFJ      Partial        Yes      Yes        Fibrin         Chronic                                                    stranding                     +---------+---------------+---------+-----------+---------------+--------------+ FV Prox  Full                                                             +---------+---------------+---------+-----------+---------------+--------------+ FV Mid   Full                                                             +---------+---------------+---------+-----------+---------------+--------------+ FV DistalNone           No       No                        Acute          +---------+---------------+---------+-----------+---------------+--------------+ PFV      Full                                                             +---------+---------------+---------+-----------+---------------+--------------+ POP      None           No       No                        Acute           +---------+---------------+---------+-----------+---------------+--------------+ PTV      Full                                                             +---------+---------------+---------+-----------+---------------+--------------+ PERO     Full                                                             +---------+---------------+---------+-----------+---------------+--------------+  Gastroc  None           No       No                        Acute          +---------+---------------+---------+-----------+---------------+--------------+     Summary: RIGHT: - No evidence of common femoral vein obstruction.  LEFT: - Findings consistent with acute deep vein thrombosis involving the left femoral vein, left popliteal vein, and left gastrocnemius veins. - Findings consistent with chronic deep vein thrombosis involving the left common femoral vein, and SF junction. - No cystic structure found in the popliteal fossa.  *See table(s) above for measurements and observations.    Preliminary     Pending Labs Unresulted Labs (From admission, onward)          Start     Ordered   11/14/20 0630  APTT  Once-Timed,   STAT       Comments: Please do not draw before 0630    11/13/20 2320   11/14/20 0500  CBC  Daily,   R      11/13/20 2208   11/14/20 0500  Heparin level (unfractionated)  Daily,   R      11/13/20 2208   11/14/20 0998  Basic metabolic panel  Tomorrow morning,   R        11/13/20 2337          Vitals/Pain Today's Vitals   11/13/20 2015 11/13/20 2115 11/13/20 2245 11/13/20 2345  BP: (!) 158/80 (!) 171/65 (!) 183/67 (!) 158/57  Pulse: (!) 56 (!) 55 68 (!) 55  Resp: 19 17 12    Temp:      TempSrc:      SpO2: 99% 98% 100% 96%  Weight:      Height:      PainSc:        Isolation Precautions No active isolations  Medications Medications  heparin bolus via infusion 1,900 Units (1,900 Units Intravenous Bolus from Bag 11/13/20 2222)    Followed by  heparin ADULT  infusion 100 units/mL (25000 units/2110mL) (1,000 Units/hr Intravenous New Bag/Given 11/13/20 2230)  ALPRAZolam (XANAX) tablet 0.125-0.25 mg (has no administration in time range)  escitalopram (LEXAPRO) tablet 20 mg (has no administration in time range)  dicyclomine (BENTYL) tablet 20 mg (has no administration in time range)  rOPINIRole (REQUIP) tablet 1 mg (1 mg Oral Given by Other 11/14/20 0009)  acetaminophen (TYLENOL) tablet 650 mg (has no administration in time range)    Or  acetaminophen (TYLENOL) suppository 650 mg (has no administration in time range)  HYDROcodone-acetaminophen (NORCO/VICODIN) 5-325 MG per tablet 1-2 tablet (has no administration in time range)  polyethylene glycol (MIRALAX / GLYCOLAX) packet 17 g (has no administration in time range)  ondansetron (ZOFRAN) tablet 4 mg (has no administration in time range)    Or  ondansetron (ZOFRAN) injection 4 mg (has no administration in time range)  iohexol (OMNIPAQUE) 350 MG/ML injection 100 mL (100 mLs Intravenous Contrast Given 11/13/20 2049)    Mobility ambulatory

## 2020-11-14 NOTE — Progress Notes (Signed)
[place] for IV heparin  Indication: DVT  See progress note from Reuel Boom, PharmD for full details.  Pharmacy dosing IV heparin for new LLE DVT in patient with hx DVT/PE on Xarelto PTA.  Heparin level therapeutic (0.59) on 1000 units/hr.  No bleeding issues reported per d/w RN.  Plan: - Continue heparin at 1000 units/hr - Daily heparin level, CBC - Monitor for signs/symptoms of bleeding  Peggyann Juba, PharmD, BCPS Pharmacy: [phone] 11/14/2020 4:39 PM

## 2020-11-14 NOTE — Plan of Care (Signed)
  Problem: Education: Goal: Knowledge of General Education information will improve Description: Including pain rating scale, medication(s)/side effects and non-pharmacologic comfort measures Outcome: Progressing   Problem: Education: Goal: Knowledge of General Education information will improve Description: Including pain rating scale, medication(s)/side effects and non-pharmacologic comfort measures Outcome: Progressing   Problem: Health Behavior/Discharge Planning: Goal: Ability to manage health-related needs will improve Outcome: Progressing   Problem: Clinical Measurements: Goal: Will remain free from infection Outcome: Progressing   Problem: Clinical Measurements: Goal: Diagnostic test results will improve Outcome: Progressing   Problem: Clinical Measurements: Goal: Respiratory complications will improve Outcome: Progressing   Problem: Clinical Measurements: Goal: Cardiovascular complication will be avoided Outcome: Progressing   Problem: Clinical Measurements: Goal: Cardiovascular complication will be avoided Outcome: Progressing   Problem: Activity: Goal: Risk for activity intolerance will decrease Outcome: Progressing   Problem: Nutrition: Goal: Adequate nutrition will be maintained Outcome: Progressing   Problem: Coping: Goal: Level of anxiety will decrease Outcome: Progressing   Problem: Elimination: Goal: Will not experience complications related to bowel motility Outcome: Progressing Goal: Will not experience complications related to urinary retention Outcome: Progressing   Problem: Pain Managment: Goal: General experience of comfort will improve Outcome: Progressing   Problem: Elimination: Goal: Will not experience complications related to urinary retention Outcome: Progressing   Problem: Safety: Goal: Ability to remain free from injury will improve Outcome: Progressing   Problem: Skin Integrity: Goal: Risk for impaired skin integrity  will decrease Outcome: Progressing

## 2020-11-14 NOTE — Progress Notes (Signed)
Patient called RN to room at 1425 complaining of chest pain and dizziness. Vital sign obtained, EKG obtained and  Dr Marthenia Rolling paged. New orders were given, patient was placed an cardiac monitoring, given ASA, Protonix and Nitro per orders. Rapid response RN alerted and conferred with. Will continue to monitor

## 2020-11-14 NOTE — Progress Notes (Signed)
PROGRESS NOTE    Emma Lopez  LSL:[id number] DOB: 03/16/1946 DOA: 11/13/2020 PCP: Michael Boston, MD  Outpatient Specialists:   Brief Narrative:  As per H&P done on admission: "Emma Lopez is a 75 y.o. female with medical history significant for DVT, PE, depression, anxiety, fibromyalgia, restless leg syndrome, presenting to the emergency department for evaluation of swelling and tenderness involving the left leg.  Patient reports a remote history of left leg DVT, subsequently developed a right lower extremity DVT and PE in 2009, had been on warfarin until transitioning to Xarelto approximately a year ago, and had been doing well until 2 to 3 days ago when she noted some tenderness behind the left knee she also noticed some mild swelling of the left calf.  She has chronic exertional dyspnea that is unchanged but denies any chest pain or palpitations and denies any hemoptysis.  There has not been any recent prolonged immobilization, no missed Xarelto doses, and no new medications.  She reports undergoing extensive work-up with hematology years ago and no specific etiology for her VTE was identified.  Her sister has also had DVTs.  ED Course: Upon arrival to the ED, patient is found to be afebrile, saturating well on room air, and with stable blood pressure.  EKG features sinus rhythm with nonspecific IVCD and repolarization abnormality.  Lower extremity venous Doppler notable for acute DVT involving the left femoral, popliteal, and gastrocnemius veins with chronic clot involving the left and SF. CTA chest was negative for PE.  Chemistry panel and CBC unremarkable.  ED PA discussed the case with hematologist and it was recommended that patient be admitted, started on IV heparin, and then transition to Lovenox".  11/14/2020: Patient seen.  Left lower extremity swelling and pain have resolved.  CTA chest is negative for pulmonary embolism.  Patient is currently on heparin drip.  Xarelto has been  discontinued.  Patient reported chest pain today.  Chronic, patient has had extensive cardiac work-up.  Cardiology attending, Dr. Harl Bowie, has been consulted (I personally discussed the case with Dr. Harl Bowie).  We will change patient to inpatient.  Will place patient on telemetry.  EKG has been ordered and reviewed.  EKG revealed poor R wave progression and sinus bradycardia.  Troponin is pending.  Patient is already on heparin.  Aspirin added.  Sublingual nitro also added.  PPI was added.  Assessment & Plan:   Principal Problem:   Acute DVT (deep venous thrombosis) (HCC) Active Problems:   Fibromyalgia   RLS (restless legs syndrome)   Depression with anxiety   1. 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  Patient is tolerating heparin.  2. Depression, anxiety  - Stable, continue Lexapro and as-needed Xanax    3. RLS  - Continue Requip   4.  Chest pain: -Patient is known to Dr. Fransico Him, local cardiologist. -Patient has undergone extensive cardiac work-up in the last 3 years according to the patient. -Cardiology team consulted.  Discussed with Dr. Harl Bowie, cardiologist. -EKG revealed poor R wave progression. -Troponin is pending. -Continue heparin drip. -Aspirin and Protonix. -Sublingual nitro  as needed. -Patient reports intermittent pressure-like chest pain without associated shortness of breath, nausea or vomiting.  DVT prophylaxis: IV heparin Code Status: Full code Family Communication:  Disposition Plan: Home eventual   Consultants:   None  Procedures:   None  Antimicrobials:    None   Subjective: Intermittent chest pain.  Objective: Vitals:   11/14/20 0045 11/14/20 0110 11/14/20 0453 11/14/20 0924  BP: (!) 141/56 (!) 162/70 (!) 132/43 129/74  Pulse: (!) 53 (!) 51 60 60  Resp: 16 18 20 16   Temp:  98.8 F (37.1 C) 97.9 F (36.6 C) 98.1 F (36.7 C)  TempSrc:  Oral Oral Oral  SpO2: 93% 94% 98%   Weight:   61.6 kg   Height:        Intake/Output Summary (Last 24 hours) at 11/14/2020 1329 Last data filed at 11/14/2020 1210 Gross per 24 hour  Intake 633.05 ml  Output --  Net 633.05 ml   Filed Weights   11/13/20 1640 11/14/20 0453  Weight: 61.2 kg 61.6 kg    Examination:  General exam: Appears calm and comfortable.  Patient is denying any distress. Respiratory system: Clear to auscultation. Respiratory effort normal. Cardiovascular system: S1 & S2 with mild bradycardia.   Gastrointestinal system: Abdomen is nondistended, soft and nontender. No organomegaly or masses felt. Normal bowel sounds heard. Central nervous system: Alert and oriented. No focal neurological deficits.  Patient moves all extremities. Extremities: No leg edema.  Data Reviewed: I have personally reviewed following labs and imaging studies  CBC: Recent Labs  Lab 11/13/20 1653 11/14/20 0653  WBC 9.2 8.3  HGB 14.9 13.3  HCT 45.6 40.7  MCV 96.2 96.9  PLT 225 678   Basic Metabolic Panel: Recent Labs  Lab 11/13/20 1653 11/14/20 0653  NA 142 140  K 3.7 4.0  CL 107 108  CO2 28 25  GLUCOSE 106* 103*  BUN 19 13  CREATININE 0.81 0.65  CALCIUM 9.2 8.3*   GFR: Estimated Creatinine Clearance: 54.9 mL/min (by C-G formula based on SCr of 0.65 mg/dL). Liver Function Tests: No results for input(s): AST, ALT, ALKPHOS, BILITOT, PROT, ALBUMIN in the last 168 hours. No results for input(s): LIPASE, AMYLASE in the last 168 hours. No results for input(s): AMMONIA in the last 168 hours. Coagulation Profile: Recent Labs  Lab 11/13/20 2109  INR 1.0   Cardiac Enzymes: No  results for input(s): CKTOTAL, CKMB, CKMBINDEX, TROPONINI in the last 168 hours. BNP (last 3 results) No results for input(s): PROBNP in the last 8760 hours. HbA1C: No results for input(s): HGBA1C in the last 72 hours. CBG: No results for input(s): GLUCAP in the last 168 hours. Lipid Profile: No results for input(s): CHOL, HDL, LDLCALC, TRIG, CHOLHDL, LDLDIRECT in the last 72 hours. Thyroid Function Tests: No results for input(s): TSH, T4TOTAL, FREET4, T3FREE, THYROIDAB in the last 72 hours. Anemia Panel: No results for input(s): VITAMINB12, FOLATE, FERRITIN, TIBC, IRON, RETICCTPCT in the last 72 hours. Urine analysis:    Component Value Date/Time   COLORURINE YELLOW 11/05/2020 1459   APPEARANCEUR CLOUDY (A) 11/05/2020 1459   LABSPEC 1.020 11/05/2020 1459   PHURINE 5.0 11/05/2020 1459   GLUCOSEU NEGATIVE 11/05/2020 1459   HGBUR 1+ (A) 11/05/2020 1459   BILIRUBINUR NEGATIVE 11/14/2017 1422   KETONESUR NEGATIVE 11/05/2020 1459   PROTEINUR NEGATIVE 11/05/2020 1459   UROBILINOGEN 0.2 02/27/2014 1239   NITRITE NEGATIVE 11/14/2017 1422   LEUKOCYTESUR NEGATIVE 11/14/2017 1422   Sepsis Labs: @LABRCNTIP (procalcitonin:4,lacticidven:4)  ) Recent Results (  from the past 240 hour(s))  Urine Culture     Status: None   Collection Time: 11/05/20  2:59 PM  Result Value Ref Range Status   MICRO NUMBER: [id number]  Final   SPECIMEN QUALITY: Adequate  Final   Sample Source URINE  Final   STATUS: FINAL  Final   Result: No Growth  Final   COMMENT:   Final    Due to supply chain limits, the laboratory is temporarily performing urine cultures from FDA approved preservative tubes that have not been validated by [employer], as well as from refrigerated sterile urine collection cups that do not contain a  preservative. Culture of unpreserved urine may produce falsely elevated bacterial counts.   WET PREP FOR [place], YEAST, CLUE     Status: None   Collection Time: 11/05/20  3:58 PM   Specimen:  Genital  Result Value Ref Range Status   Source: VAGINA  Final   RESULT   Final    Comment: EPITHELIAL CELLS-PRESENT CLUE CELLS-NONE SEEN YEAST-NONE SEEN TRICHOMONAS-NONE SEEN WBC-MODERATE BACTERIA-MODERATE EPITH. CELLS (13-20) HPF   Resp Panel by RT-PCR (Flu A&B, Covid) Nasopharyngeal Swab     Status: None   Collection Time: 11/13/20  7:08 PM   Specimen: Nasopharyngeal Swab; Nasopharyngeal(NP) swabs in vial transport medium  Result Value Ref Range Status   SARS Coronavirus 2 by RT PCR NEGATIVE NEGATIVE Final    Comment: (NOTE) SARS-CoV-2 target nucleic acids are NOT DETECTED.  The SARS-CoV-2 RNA is generally detectable in upper respiratory specimens during the acute phase of infection. The lowest concentration of SARS-CoV-2 viral copies this assay can detect is 138 copies/mL. A negative result does not preclude SARS-Cov-2 infection and should not be used as the sole basis for treatment or other patient management decisions. A negative result may occur with  improper specimen collection/handling, submission of specimen other than nasopharyngeal swab, presence of viral mutation(s) within the areas targeted by this assay, and inadequate number of viral copies(<138 copies/mL). A negative result must be combined with clinical observations, patient history, and epidemiological information. The expected result is Negative.  Fact Sheet for Patients:  EntrepreneurPulse.com.au  Fact Sheet for Healthcare Providers:  IncredibleEmployment.be  This test is no t yet approved or cleared by the [place] FDA and  has been authorized for detection and/or diagnosis of SARS-CoV-2 by FDA under an Emergency Use Authorization (EUA). This EUA will remain  in effect (meaning this test can be used) for the duration of the COVID-19 declaration under Section 564(b)(1) of the Act, 21 U.S.C.section 360bbb-3(b)(1), unless the authorization is terminated  or  revoked sooner.       Influenza A by PCR NEGATIVE NEGATIVE Final   Influenza B by PCR NEGATIVE NEGATIVE Final    Comment: (NOTE) The Xpert Xpress SARS-CoV-2/FLU/RSV plus assay is intended as an aid in the diagnosis of influenza from Nasopharyngeal swab specimens and should not be used as a sole basis for treatment. Nasal washings and aspirates are unacceptable for Xpert Xpress SARS-CoV-2/FLU/RSV testing.  Fact Sheet for Patients: EntrepreneurPulse.com.au  Fact Sheet for Healthcare Providers: IncredibleEmployment.be  This test is not yet approved or cleared by the [place] FDA and has been authorized for detection and/or diagnosis of SARS-CoV-2 by FDA under an Emergency Use Authorization (EUA). This EUA will remain in effect (meaning this test can be used) for the duration of the COVID-19 declaration under Section 564(b)(1) of the Act, 21 U.S.C. section 360bbb-3(b)(1), unless the authorization is terminated or revoked.  Performed  at [facility], [place] [address] [place], [place] [address]          Radiology Studies: CT Angio Chest PE W and/or Wo Contrast  Result Date: 11/13/2020 CLINICAL DATA:  Left leg pain and swelling for 2 days, history of DVT EXAM: CT ANGIOGRAPHY CHEST WITH CONTRAST TECHNIQUE: Multidetector CT imaging of the chest was performed using the standard protocol during bolus administration of intravenous contrast. Multiplanar CT image reconstructions and MIPs were obtained to evaluate the vascular anatomy. CONTRAST:  181mL OMNIPAQUE IOHEXOL 350 MG/ML SOLN COMPARISON:  11/17/2017 FINDINGS: Cardiovascular: This is a technically adequate evaluation of the pulmonary vasculature. No filling defects or pulmonary emboli. The heart is unremarkable without pericardial effusion. Normal caliber of the thoracic aorta, with mild atherosclerosis. Mediastinum/Nodes: No enlarged mediastinal, hilar, or axillary lymph  nodes. Thyroid gland, trachea, and esophagus demonstrate no significant findings. Lungs/Pleura: No acute airspace disease, effusion, or pneumothorax. Central airways are patent. Upper Abdomen: No acute abnormality. Musculoskeletal: No acute or destructive bony lesions. Reconstructed images demonstrate no additional findings. Review of the MIP images confirms the above findings. IMPRESSION: 1. No evidence of pulmonary embolus. 2. No acute intrathoracic process. 3.  Aortic Atherosclerosis (ICD10-I70.0). Electronically Signed   By: Randa Ngo M.D.   On: 11/13/2020 21:12   VAS [place] LOWER EXTREMITY VENOUS (DVT) (ONLY [facility] & [facility])  Result Date: 11/14/2020  Lower Venous DVT Study Indications: History of DVT/PE, pain and swelling of LT calf.  Anticoagulation: Xarelto, switched from Coumadin approximately one year ago. Comparison Study: No recent prior studies available. Performing Technologist: Darlin Coco RDMS,RVT  Examination Guidelines: A complete evaluation includes B-mode imaging, spectral Doppler, color Doppler, and power Doppler as needed of all accessible portions of each vessel. Bilateral testing is considered an integral part of a complete examination. Limited examinations for reoccurring indications may be performed as noted. The reflux portion of the exam is performed with the patient in reverse Trendelenburg.  +-----+---------------+---------+-----------+----------+--------------+ RIGHTCompressibilityPhasicitySpontaneityPropertiesThrombus Aging +-----+---------------+---------+-----------+----------+--------------+ CFV  Full           Yes      Yes                                 +-----+---------------+---------+-----------+----------+--------------+   +---------+---------------+---------+-----------+---------------+--------------+ LEFT     CompressibilityPhasicitySpontaneityProperties     Thrombus Aging +---------+---------------+---------+-----------+---------------+--------------+ CFV       Partial        Yes      Yes        Fibrin         Chronic                                                    stranding                     +---------+---------------+---------+-----------+---------------+--------------+ SFJ      Partial        Yes      Yes        Fibrin         Chronic  stranding                     +---------+---------------+---------+-----------+---------------+--------------+ FV Prox  Full                                                             +---------+---------------+---------+-----------+---------------+--------------+ FV Mid   Full                                                             +---------+---------------+---------+-----------+---------------+--------------+ FV DistalNone           No       No                        Acute          +---------+---------------+---------+-----------+---------------+--------------+ PFV      Full                                                             +---------+---------------+---------+-----------+---------------+--------------+ POP      None           No       No                        Acute          +---------+---------------+---------+-----------+---------------+--------------+ PTV      Full                                                             +---------+---------------+---------+-----------+---------------+--------------+ PERO     Full                                                             +---------+---------------+---------+-----------+---------------+--------------+ Gastroc  None           No       No                        Acute          +---------+---------------+---------+-----------+---------------+--------------+     Summary: RIGHT: - No evidence of common femoral vein obstruction.  LEFT: - Findings consistent with acute deep vein thrombosis involving the left femoral vein, left  popliteal vein, and left gastrocnemius veins. - Findings consistent with chronic deep vein thrombosis involving the left common femoral vein, and SF junction. - No cystic structure found in the popliteal fossa.  *See table(s) above for measurements and observations. Electronically signed by Servando Snare  MD on 11/14/2020 at 9:38:40 AM.    Final         Scheduled Meds: . lip balm      . escitalopram  20 mg Oral Daily  . rOPINIRole  1 mg Oral QHS   Continuous Infusions: . heparin 1,000 Units/hr (11/13/20 2230)     LOS: 0 days    Time spent: 35 minutes.    Dana Allan, MD  [facility] Pager #: [phone] 7PM-7AM contact night coverage as above

## 2020-11-14 NOTE — Progress Notes (Signed)
[place] for IV heparin  Indication: DVT  Allergies  Allergen Reactions  . Sulfa Antibiotics Rash    Patient Measurements: Height: 5\' 2"  (157.5 cm) Weight: 61.6 kg (135 lb 12.9 oz) IBW/kg (Calculated) : 50.1 Heparin Dosing Weight: TBW  Vital Signs: Temp: 97.9 F (36.6 C) (04/16 0453) Temp Source: Oral (04/16 0453) BP: 132/43 (04/16 0453) Pulse Rate: 60 (04/16 0453)  Labs: Recent Labs    11/13/20 1653 11/13/20 2109 11/14/20 0653  HGB 14.9  --  13.3  HCT 45.6  --  40.7  PLT 225  --  199  APTT  --  24 49*  LABPROT  --  13.1  --   INR  --  1.0  --   HEPARINUNFRC  --  0.14* 0.51  CREATININE 0.81  --  0.65    Estimated Creatinine Clearance: 54.9 mL/min (by C-G formula based on SCr of 0.65 mg/dL).   Medications:  PTA Xarelto 10mg  PO daily-last dose reported as 4/14 at 1700  Assessment: 74 y/oF with PMH of DVT/PE on Xarelto PTA who presented with 1 day of left calf swelling. LE venous duplex + for LLE DVT. CTa chest negative for PE. Pharmacy consulted for IV heparin dosing. Baseline CBC WNL. Note that recent Rivaroxaban doses may falsely elevate heparin levels.   Today, 11/14/2020:  CBC: stable WNL  Baseline heparin level does not appear to be significantly elevated by Xarelto, possibly d/t being on the lower prophylactic dosing  APTT subtherapeutic on 1000 units/hr  SCr stable at baseline  No bleeding or infusion issues per RN  Goal of Therapy:  Heparin level 0.3-0.7 units/ml  APTT 66-102 seconds Monitor platelets by anticoagulation protocol: Yes   Plan:   Continue heparin at 1000 units/hr - although aPTT low, given other baseline labs were unremarkable, the low baseline heparin suggests minimal interference from Xarelto, making heparin level the more reliable of the two  Recheck confirmatory heparin level in 8 hrs  Daily CBC, heparin level  Monitor closely for s/sx of bleeding  F/u plans for long-term  anticoagulation - Hematology recommending eventual transition to Lovenox, but given failure occurred on prophylactic-dose Xarelto, resuming full-dose Xarelto indefinitely could be an effective alternative option, assuming patient tolerated this dose well previously.   Reuel Boom, PharmD, BCPS [phone] 11/14/2020, 7:37 AM

## 2020-11-15 ENCOUNTER — Inpatient Hospital Stay (HOSPITAL_COMMUNITY): Payer: PPO

## 2020-11-15 DIAGNOSIS — R079 Chest pain, unspecified: Secondary | ICD-10-CM

## 2020-11-15 DIAGNOSIS — R0789 Other chest pain: Secondary | ICD-10-CM

## 2020-11-15 LAB — CBC
HCT: 41.1 % (ref 36.0–46.0)
Hemoglobin: 13.2 g/dL (ref 12.0–15.0)
MCH: 31.7 pg (ref 26.0–34.0)
MCHC: 32.1 g/dL (ref 30.0–36.0)
MCV: 98.6 fL (ref 80.0–100.0)
Platelets: 207 10*3/uL (ref 150–400)
RBC: 4.17 MIL/uL (ref 3.87–5.11)
RDW: 13.6 % (ref 11.5–15.5)
WBC: 8.7 10*3/uL (ref 4.0–10.5)
nRBC: 0 % (ref 0.0–0.2)

## 2020-11-15 LAB — HEPARIN LEVEL (UNFRACTIONATED)
Heparin Unfractionated: 1.07 IU/mL — ABNORMAL HIGH (ref 0.30–0.70)
Heparin Unfractionated: 0.91 IU/mL — ABNORMAL HIGH (ref 0.30–0.70)

## 2020-11-15 LAB — ECHOCARDIOGRAM COMPLETE
Area-P 1/2: 2.54 cm2
Height: 62 in
S' Lateral: 2.5 cm
Weight: 2172.85 oz

## 2020-11-15 LAB — APTT: aPTT: 110 seconds — ABNORMAL HIGH (ref 24–36)

## 2020-11-15 MED ORDER — HEPARIN (PORCINE) 25000 UT/250ML-% IV SOLN
700.0000 [IU]/h | INTRAVENOUS | Status: DC
  Administered 2020-11-16: 600 [IU]/h via INTRAVENOUS
  Filled 2020-11-15 (×2): qty 250

## 2020-11-15 MED ORDER — HEPARIN (PORCINE) 25000 UT/250ML-% IV SOLN
850.0000 [IU]/h | INTRAVENOUS | Status: DC
  Administered 2020-11-15: 850 [IU]/h via INTRAVENOUS
  Filled 2020-11-15: qty 250

## 2020-11-15 MED ORDER — ALUM & MAG HYDROXIDE-SIMETH 200-200-20 MG/5ML PO SUSP
30.0000 mL | Freq: Four times a day (QID) | ORAL | Status: DC | PRN
  Administered 2020-11-15: 30 mL via ORAL
  Filled 2020-11-15: qty 30

## 2020-11-15 NOTE — Progress Notes (Signed)
PROGRESS NOTE    Emma Lopez  MGQ:[id number] DOB: [birth year] DOA: 11/13/2020 PCP: Michael Boston, MD  Outpatient Specialists:   Brief Narrative:  As per H&P done on admission: "Emma Lopez is a 75 y.o. female with medical history significant for DVT, PE, depression, anxiety, fibromyalgia, restless leg syndrome, presenting to the emergency department for evaluation of swelling and tenderness involving the left leg.  Patient reports a remote history of left leg DVT, subsequently developed a right lower extremity DVT and PE in 2009, had been on warfarin until transitioning to Xarelto approximately a year ago, and had been doing well until 2 to 3 days ago when she noted some tenderness behind the left knee she also noticed some mild swelling of the left calf.  She has chronic exertional dyspnea that is unchanged but denies any chest pain or palpitations and denies any hemoptysis.  There has not been any recent prolonged immobilization, no missed Xarelto doses, and no new medications.  She reports undergoing extensive work-up with hematology years ago and no specific etiology for her VTE was identified.  Her sister has also had DVTs.  ED Course: Upon arrival to the ED, patient is found to be afebrile, saturating well on room air, and with stable blood pressure.  EKG features sinus rhythm with nonspecific IVCD and repolarization abnormality.  Lower extremity venous Doppler notable for acute DVT involving the left femoral, popliteal, and gastrocnemius veins with chronic clot involving the left and SF. CTA chest was negative for PE.  Chemistry panel and CBC unremarkable.  ED PA discussed the case with hematologist and it was recommended that patient be admitted, started on IV heparin, and then transition to Lovenox".  11/14/2020: Patient seen.  Left lower extremity swelling and pain have resolved.  CTA chest is negative for pulmonary embolism.  Patient is currently on heparin drip.  Xarelto has been  discontinued.  Patient reported chest pain today.  Chronic, patient has had extensive cardiac work-up.  Cardiology attending, Dr. Harl Bowie, has been consulted (I personally discussed the case with Dr. Harl Bowie).  We will change patient to inpatient.  Will place patient on telemetry.  EKG has been ordered and reviewed.  EKG revealed poor R wave progression and sinus bradycardia.  Troponin is pending.  Patient is already on heparin.  Aspirin added.  Sublingual nitro also added.  PPI was added.  11/15/2020: Patient seen.  Chest pain has resolved.  Troponins came back negative.  Patient remains on IV heparin.  Will discuss further care with hematology/oncology team tomorrow.  On presentation, patient's care was discussed with Dr. Marin Olp.  Assessment & Plan:   Principal Problem:   Acute DVT (deep venous thrombosis) (HCC) Active Problems:   DVT (deep venous thrombosis) (HCC)   Fibromyalgia   RLS (restless legs syndrome)   Depression with anxiety   1. 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  Patient is tolerating heparin.  2. Depression, anxiety  - Stable, continue Lexapro and as-needed Xanax    3. RLS  - Continue Requip   4.  Chest pain: -Patient is known to  Dr. Fransico Him, local cardiologist. -Patient has undergone extensive cardiac work-up in the last 3 years according to the patient. -Cardiology team consulted.  Discussed with Dr. Harl Bowie, cardiologist. -EKG revealed poor R wave progression. -Troponin is pending. -Continue heparin drip. -Aspirin and Protonix. -Sublingual nitro as needed. -Patient reports intermittent  pressure-like chest pain without associated shortness of breath, nausea or vomiting. 11/15/2020: Chest pain has resolved.  Negative troponin.  Echocardiogram revealed normal left ventricular ejection fraction, indeterminate left ventricular diastolic parameters.  DVT prophylaxis: IV heparin Code Status: Full code Family Communication:  Disposition Plan: Home eventual   Consultants:   None  Procedures:   None  Antimicrobials:   None   Subjective: No chest pain.  Objective: Vitals:   11/14/20 1428 11/14/20 2036 11/15/20 0515 11/15/20 1328  BP: (!) 148/67 (!) 153/60 (!) 124/58 (!) 123/55  Pulse: (!) 58 (!) 52 (!) 53 (!) 54  Resp: 18 20 18 16   Temp: 98 F (36.7 C) 98.4 F (36.9 C) 98.6 F (37 C) 98.8 F (37.1 C)  TempSrc: Oral Oral Oral Oral  SpO2: 98% 97% 97% 97%  Weight:      Height:        Intake/Output Summary (Last 24 hours) at 11/15/2020 1802 Last data filed at 11/15/2020 1330 Gross per 24 hour  Intake 480 ml  Output --  Net 480 ml   Filed Weights   11/13/20 1640 11/14/20 0453  Weight: 61.2 kg 61.6 kg    Examination:  General exam: Appears calm and comfortable.  Patient is denying any distress. Respiratory system: Clear to auscultation. Respiratory effort normal. Cardiovascular system: S1 & S2 with mild bradycardia.   Gastrointestinal system: Abdomen is nondistended, soft and nontender. No organomegaly or masses felt. Normal bowel sounds heard. Central nervous system: Alert and oriented. No focal neurological deficits.  Patient moves all extremities. Extremities: No leg edema.  Data Reviewed: I have personally reviewed following labs and imaging studies  CBC: Recent Labs  Lab 11/13/20 1653 11/14/20 0653 11/15/20 0442  WBC 9.2 8.3 8.7  HGB 14.9 13.3 13.2  HCT 45.6 40.7 41.1  MCV 96.2 96.9 98.6  PLT 225 199 914   Basic Metabolic Panel: Recent Labs  Lab 11/13/20 1653 11/14/20 0653  NA 142 140  K 3.7 4.0  CL 107 108  CO2 28 25  GLUCOSE  106* 103*  BUN 19 13  CREATININE 0.81 0.65  CALCIUM 9.2 8.3*   GFR: Estimated Creatinine Clearance: 54.9 mL/min (by C-G formula based on SCr of 0.65 mg/dL). Liver Function Tests: No results for input(s): AST, ALT, ALKPHOS, BILITOT, PROT, ALBUMIN in the last 168 hours. No results for input(s): LIPASE, AMYLASE in the last 168 hours. No results for input(s): AMMONIA in the last 168 hours. Coagulation Profile: Recent Labs  Lab 11/13/20 2109  INR 1.0   Cardiac Enzymes: No results for input(s): CKTOTAL, CKMB, CKMBINDEX, TROPONINI in the last 168 hours. BNP (last 3 results) No results for input(s): PROBNP in the last 8760 hours. HbA1C: No results for input(s): HGBA1C in the last 72 hours. CBG: No results for input(s): GLUCAP in the last 168 hours. Lipid Profile: No results for input(s): CHOL, HDL, LDLCALC, TRIG, CHOLHDL, LDLDIRECT in the last 72 hours. Thyroid Function Tests: No results for input(s): TSH, T4TOTAL, FREET4, T3FREE, THYROIDAB in the last 72 hours. Anemia Panel: No results for input(s): VITAMINB12, FOLATE, FERRITIN, TIBC, IRON, RETICCTPCT in the last 72 hours. Urine analysis:    Component Value Date/Time   COLORURINE YELLOW  11/05/2020 1459   APPEARANCEUR CLOUDY (A) 11/05/2020 1459   LABSPEC 1.020 11/05/2020 1459   PHURINE 5.0 11/05/2020 1459   GLUCOSEU NEGATIVE 11/05/2020 1459   HGBUR 1+ (A) 11/05/2020 1459   BILIRUBINUR NEGATIVE 11/14/2017 1422   KETONESUR NEGATIVE 11/05/2020 1459   PROTEINUR NEGATIVE 11/05/2020 1459   UROBILINOGEN 0.2 02/27/2014 1239   NITRITE NEGATIVE 11/14/2017 1422   LEUKOCYTESUR NEGATIVE 11/14/2017 1422   Sepsis Labs: @LABRCNTIP (procalcitonin:4,lacticidven:4)  ) Recent Results (from the past 240 hour(s))  Resp Panel by RT-PCR (Flu A&B, Covid) Nasopharyngeal Swab     Status: None   Collection Time: 11/13/20  7:08 PM   Specimen: Nasopharyngeal Swab; Nasopharyngeal(NP) swabs in vial transport medium  Result Value Ref Range Status    SARS Coronavirus 2 by RT PCR NEGATIVE NEGATIVE Final    Comment: (NOTE) SARS-CoV-2 target nucleic acids are NOT DETECTED.  The SARS-CoV-2 RNA is generally detectable in upper respiratory specimens during the acute phase of infection. The lowest concentration of SARS-CoV-2 viral copies this assay can detect is 138 copies/mL. A negative result does not preclude SARS-Cov-2 infection and should not be used as the sole basis for treatment or other patient management decisions. A negative result may occur with  improper specimen collection/handling, submission of specimen other than nasopharyngeal swab, presence of viral mutation(s) within the areas targeted by this assay, and inadequate number of viral copies(<138 copies/mL). A negative result must be combined with clinical observations, patient history, and epidemiological information. The expected result is Negative.  Fact Sheet for Patients:  EntrepreneurPulse.com.au  Fact Sheet for Healthcare Providers:  IncredibleEmployment.be  This test is no t yet approved or cleared by the [place] FDA and  has been authorized for detection and/or diagnosis of SARS-CoV-2 by FDA under an Emergency Use Authorization (EUA). This EUA will remain  in effect (meaning this test can be used) for the duration of the COVID-19 declaration under Section 564(b)(1) of the Act, 21 U.S.C.section 360bbb-3(b)(1), unless the authorization is terminated  or revoked sooner.       Influenza A by PCR NEGATIVE NEGATIVE Final   Influenza B by PCR NEGATIVE NEGATIVE Final    Comment: (NOTE) The Xpert Xpress SARS-CoV-2/FLU/RSV plus assay is intended as an aid in the diagnosis of influenza from Nasopharyngeal swab specimens and should not be used as a sole basis for treatment. Nasal washings and aspirates are unacceptable for Xpert Xpress SARS-CoV-2/FLU/RSV testing.  Fact Sheet for  Patients: EntrepreneurPulse.com.au  Fact Sheet for Healthcare Providers: IncredibleEmployment.be  This test is not yet approved or cleared by the [place] FDA and has been authorized for detection and/or diagnosis of SARS-CoV-2 by FDA under an Emergency Use Authorization (EUA). This EUA will remain in effect (meaning this test can be used) for the duration of the COVID-19 declaration under Section 564(b)(1) of the Act, 21 U.S.C. section 360bbb-3(b)(1), unless the authorization is terminated or revoked.  Performed at [facility], [place] [address] [place], [place] [address]          Radiology Studies: CT Angio Chest PE W and/or Wo Contrast  Result Date: 11/13/2020 CLINICAL DATA:  Left leg pain and swelling for 2 days, history of DVT EXAM: CT ANGIOGRAPHY CHEST WITH CONTRAST TECHNIQUE: Multidetector CT imaging of the chest was performed using the standard protocol during bolus administration of intravenous contrast. Multiplanar CT image reconstructions and MIPs were obtained to evaluate the vascular anatomy. CONTRAST:  118mL OMNIPAQUE IOHEXOL 350 MG/ML SOLN COMPARISON:  11/17/2017 FINDINGS: Cardiovascular: This is  a technically adequate evaluation of the pulmonary vasculature. No filling defects or pulmonary emboli. The heart is unremarkable without pericardial effusion. Normal caliber of the thoracic aorta, with mild atherosclerosis. Mediastinum/Nodes: No enlarged mediastinal, hilar, or axillary lymph nodes. Thyroid gland, trachea, and esophagus demonstrate no significant findings. Lungs/Pleura: No acute airspace disease, effusion, or pneumothorax. Central airways are patent. Upper Abdomen: No acute abnormality. Musculoskeletal: No acute or destructive bony lesions. Reconstructed images demonstrate no additional findings. Review of the MIP images confirms the above findings. IMPRESSION: 1. No evidence of pulmonary embolus. 2. No acute  intrathoracic process. 3.  Aortic Atherosclerosis (ICD10-I70.0). Electronically Signed   By: Randa Ngo M.D.   On: 11/13/2020 21:12   ECHOCARDIOGRAM COMPLETE  Result Date: 11/15/2020    ECHOCARDIOGRAM REPORT   Patient Name:   Emma Lopez Date of Exam: 11/15/2020 Medical Rec #:  [id number]     Height:       62.0 in Accession #:    [id number]    Weight:       135.8 lb Date of Birth:  09/22/1945     BSA:          1.622 m Patient Age:    77 years      BP:           124/58 mmHg Patient Gender: F             HR:           63 bpm. Exam Location:  Inpatient Procedure: 2D Echo, Cardiac Doppler and Color Doppler Indications:    R07.9* Chest pain, unspecified  History:        Patient has prior history of Echocardiogram examinations, most                 recent 12/08/2017. Risk Factors:Dyslipidemia. Pulmonary Embolus.                 Thyroid Disease. GERD.  Sonographer:    Tiffany Dance Referring Phys: [id number] [place]  1. Left ventricular ejection fraction, by estimation, is 60 to 65%. The left ventricle has normal function. The left ventricle has no regional wall motion abnormalities. Left ventricular diastolic parameters are indeterminate.  2. Right ventricular systolic function is normal. The right ventricular size is normal. There is normal pulmonary artery systolic pressure.  3. The mitral valve is normal in structure. Trivial mitral valve regurgitation. No evidence of mitral stenosis.  4. The aortic valve has an indeterminant number of cusps. Aortic valve regurgitation is not visualized. No aortic stenosis is present.  5. The inferior vena cava is normal in size with greater than 50% respiratory variability, suggesting right atrial pressure of 3 mmHg. FINDINGS  Left Ventricle: Left ventricular ejection fraction, by estimation, is 60 to 65%. The left ventricle has normal function. The left ventricle has no regional wall motion abnormalities. The left ventricular internal cavity size was normal  in size. There is  no left ventricular hypertrophy. Left ventricular diastolic parameters are indeterminate. Right Ventricle: The right ventricular size is normal. No increase in right ventricular wall thickness. Right ventricular systolic function is normal. There is normal pulmonary artery systolic pressure. The tricuspid regurgitant velocity is 2.41 m/s, and  with an assumed right atrial pressure of 8 mmHg, the estimated right ventricular systolic pressure is 47.6 mmHg. Left Atrium: Left atrial size was normal in size. Right Atrium: Right atrial size was normal in size. Pericardium: There is no evidence of pericardial effusion. Mitral Valve:  The mitral valve is normal in structure. Trivial mitral valve regurgitation. No evidence of mitral valve stenosis. Tricuspid Valve: The tricuspid valve is normal in structure. Tricuspid valve regurgitation is trivial. No evidence of tricuspid stenosis. Aortic Valve: The aortic valve has an indeterminant number of cusps. Aortic valve regurgitation is not visualized. No aortic stenosis is present. Pulmonic Valve: The pulmonic valve was not well visualized. Pulmonic valve regurgitation is trivial. No evidence of pulmonic stenosis. Aorta: The aortic root is normal in size and structure. Pulmonary Artery: Indeterminant PASP, inadequate TR jet. Venous: The inferior vena cava is normal in size with greater than 50% respiratory variability, suggesting right atrial pressure of 3 mmHg. IAS/Shunts: No atrial level shunt detected by color flow Doppler.  LEFT VENTRICLE PLAX 2D LVIDd:         3.90 cm  Diastology LVIDs:         2.50 cm  LV e' medial:    5.87 cm/s LV PW:         1.10 cm  LV E/e' medial:  14.1 LV IVS:        1.00 cm  LV e' lateral:   7.62 cm/s LVOT diam:     1.90 cm  LV E/e' lateral: 10.8 LV SV:         74 LV SV Index:   46 LVOT Area:     2.84 cm  RIGHT VENTRICLE             IVC RV Basal diam:  2.90 cm     IVC diam: 2.10 cm RV S prime:     14.70 cm/s TAPSE (M-mode): 2.3 cm  LEFT ATRIUM             Index       RIGHT ATRIUM           Index LA diam:        3.80 cm 2.34 cm/m  RA Area:     11.00 cm LA Vol (A2C):   50.3 ml 31.02 ml/m RA Volume:   20.30 ml  12.52 ml/m LA Vol (A4C):   38.8 ml 23.93 ml/m LA Biplane Vol: 44.5 ml 27.44 ml/m  AORTIC VALVE LVOT Vmax:   117.08 cm/s LVOT Vmean:  73.418 cm/s LVOT VTI:    0.262 m  AORTA Ao Root diam: 2.80 cm Ao Asc diam:  3.10 cm MITRAL VALVE               TRICUSPID VALVE MV Area (PHT): 2.54 cm    TR Peak grad:   23.2 mmHg MV Decel Time: 299 msec    TR Vmax:        241.00 cm/s MV E velocity: 82.50 cm/s MV A velocity: 82.50 cm/s  SHUNTS MV E/A ratio:  1.00        Systemic VTI:  0.26 m                            Systemic Diam: 1.90 cm Carlyle Dolly MD Electronically signed by Carlyle Dolly MD Signature Date/Time: 11/15/2020/11:29:34 AM    Final    VAS [place] LOWER EXTREMITY VENOUS (DVT) (ONLY [facility] & [facility])  Result Date: 11/14/2020  Lower Venous DVT Study Indications: History of DVT/PE, pain and swelling of LT calf.  Anticoagulation: Xarelto, switched from Coumadin approximately one year ago. Comparison Study: No recent prior studies available. Performing Technologist: Darlin Coco RDMS,RVT  Examination Guidelines: A complete evaluation includes B-mode imaging, spectral  Doppler, color Doppler, and power Doppler as needed of all accessible portions of each vessel. Bilateral testing is considered an integral part of a complete examination. Limited examinations for reoccurring indications may be performed as noted. The reflux portion of the exam is performed with the patient in reverse Trendelenburg.  +-----+---------------+---------+-----------+----------+--------------+ RIGHTCompressibilityPhasicitySpontaneityPropertiesThrombus Aging +-----+---------------+---------+-----------+----------+--------------+ CFV  Full           Yes      Yes                                  +-----+---------------+---------+-----------+----------+--------------+   +---------+---------------+---------+-----------+---------------+--------------+ LEFT     CompressibilityPhasicitySpontaneityProperties     Thrombus Aging +---------+---------------+---------+-----------+---------------+--------------+ CFV      Partial        Yes      Yes        Fibrin         Chronic                                                    stranding                     +---------+---------------+---------+-----------+---------------+--------------+ SFJ      Partial        Yes      Yes        Fibrin         Chronic                                                    stranding                     +---------+---------------+---------+-----------+---------------+--------------+ FV Prox  Full                                                             +---------+---------------+---------+-----------+---------------+--------------+ FV Mid   Full                                                             +---------+---------------+---------+-----------+---------------+--------------+ FV DistalNone           No       No                        Acute          +---------+---------------+---------+-----------+---------------+--------------+ PFV      Full                                                             +---------+---------------+---------+-----------+---------------+--------------+  POP      None           No       No                        Acute          +---------+---------------+---------+-----------+---------------+--------------+ PTV      Full                                                             +---------+---------------+---------+-----------+---------------+--------------+ PERO     Full                                                             +---------+---------------+---------+-----------+---------------+--------------+ Gastroc   None           No       No                        Acute          +---------+---------------+---------+-----------+---------------+--------------+     Summary: RIGHT: - No evidence of common femoral vein obstruction.  LEFT: - Findings consistent with acute deep vein thrombosis involving the left femoral vein, left popliteal vein, and left gastrocnemius veins. - Findings consistent with chronic deep vein thrombosis involving the left common femoral vein, and SF junction. - No cystic structure found in the popliteal fossa.  *See table(s) above for measurements and observations. Electronically signed by Servando Snare MD on 11/14/2020 at 9:38:40 AM.    Final         Scheduled Meds: . escitalopram  20 mg Oral Daily  . rOPINIRole  1 mg Oral QHS   Continuous Infusions: . heparin 600 Units/hr (11/15/20 1728)     LOS: 1 day    Time spent: 25 minutes.    Dana Allan, MD  [facility] Pager #: [phone] 7PM-7AM contact night coverage as above

## 2020-11-15 NOTE — Progress Notes (Signed)
  Echocardiogram 2D Echocardiogram has been performed.  Emma Lopez 11/15/2020, 10:42 AM

## 2020-11-15 NOTE — Consult Note (Signed)
Cardiology Consultation:   Patient ID: Emma Lopez MRN: [id number]; DOB: Dec 11, 1945  Admit date: 11/13/2020 Date of Consult: 11/15/2020  PCP:  Michael Boston, MD   [place]  Cardiologist:  Fransico Him, MD  Advanced Practice Provider:  No care team member to display Electrophysiologist:  None     Patient Profile:   Emma Lopez is a 75 y.o. female with a hx of DVT/PE, chronic chest pain who is being seen today for the evaluation of chest pain at the request of Dr Marthenia Rolling.  History of Present Illness:   Emma Lopez 75 yo female history of DVT with prior PE, HL, bradycardia, fibromyalgia, GERD, chronic chest pain admitted with recurrent DVT managed by primary team. Cardiology consulted for chest pain.   Reports episode of chest pain midchest/epigastric radiating to mid back/ 2/10 in severity without associated symptoms. Lasted several hours. Was not positional. Reports different from her prior chest pains.    - Jan 2022 GXT hypertensive respnse, increased baseline ST depression with exercise inferior leads in setting of hypertensive response.  - 06/2020 DSE: no ischemia - 12/2017 coronary CTA: no CAD, coronary calcium score of 0 - 11/2017 nuclear stress no ischemia   WBC 9.2 Hgb 14.9 Plt 225 K 3.7 Cr 0.81 COVID neg Trop 8-->6 EKG sinus brady, no acute ischenmic changes CT PE no PE, no acute process 10/2020 LE [place]: acute left DVT   Past Medical History:  Diagnosis Date  . Abnormal EKG   . Anxiety   . Arthritis    oa  . Bradycardia 12/14/2017  . CIN III (cervical intraepithelial neoplasia III) 2000  . Complication of anesthesia    did well last 2 times with procedures  . Depression   . 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 DVT (deep venous thrombosis) ([place]) 01/2009   LEFT LEG  . Dyspnea on exertion 10/2017  . Elevated triglycerides with high cholesterol   . Endometriosis   . Fibromyalgia   . GERD (gastroesophageal reflux disease)   . History of colon  polyps   . History of hiatal hernia    told by some md has, some say not  . IBS (irritable bowel syndrome) 2015  . Multinodular goiter   . Osteoporosis 12/2017   T score -3.2 stable from prior study  . PE (pulmonary embolism)   . PONV (postoperative nausea and vomiting)   . Restless legs syndrome   . Sleep disorder   . Thyroid disease    HYPERTHYROIDISM  . Vitamin D deficiency     Past Surgical History:  Procedure Laterality Date  . ABDOMINAL HYSTERECTOMY  2001   TAH, partial  . APPENDECTOMY  1978  . COLONOSCOPY WITH PROPOFOL N/A 12/07/2015   Procedure: COLONOSCOPY WITH PROPOFOL;  Surgeon: Garlan Fair, MD;  Location: [facility];  Service: Endoscopy;  Laterality: N/A;  . colonscopy  7 yrs ago   other in past  . ESOPHAGOGASTRODUODENOSCOPY (EGD) WITH PROPOFOL N/A 12/07/2015   Procedure: ESOPHAGOGASTRODUODENOSCOPY (EGD) WITH PROPOFOL;  Surgeon: Garlan Fair, MD;  Location: [facility];  Service: Endoscopy;  Laterality: N/A;  . ESOPHAGOGASTRODUODENOSCOPY ENDOSCOPY  yrs ago  . FACIAL COSMETIC SURGERY    . GYNECOLOGIC CRYOSURGERY    . MYOMECTOMY    . TONSILLECTOMY       Inpatient Medications: Scheduled Meds: . escitalopram  20 mg Oral Daily  . rOPINIRole  1 mg Oral QHS   Continuous Infusions: . heparin 850 Units/hr (11/15/20 0703)  PRN Meds: acetaminophen **OR** acetaminophen, ALPRAZolam, dicyclomine, HYDROcodone-acetaminophen, nitroGLYCERIN, ondansetron **OR** ondansetron (ZOFRAN) IV, polyethylene glycol  Allergies:    Allergies  Allergen Reactions  . Sulfa Antibiotics Rash    Social History:   Social History   Socioeconomic History  . Marital status: Single    Spouse name: Not on file  . Number of children: Not on file  . Years of education: Not on file  . Highest education level: Not on file  Occupational History  . Not on file  Tobacco Use  . Smoking status: Former Smoker    Packs/day: 0.10    Years: 6.00    Pack years: 0.60    Types:  Cigarettes    Quit date: 08/01/1978    Years since quitting: 42.3  . Smokeless tobacco: Never Used  . Tobacco comment: only smoked 4-6 yrs 1 pack per month  Vaping Use  . Vaping Use: Never used  Substance and Sexual Activity  . Alcohol use: Not Currently    Alcohol/week: 0.0 standard drinks  . Drug use: No  . Sexual activity: Not Currently    Birth control/protection: Surgical    Comment: HYSTERECTOMY-1st intercourse 27-Fewer than 5 partners  Other Topics Concern  . Not on file  Social History Narrative  . Not on file   Social Determinants of Health   Financial Resource Strain: Not on file  Food Insecurity: Not on file  Transportation Needs: Not on file  Physical Activity: Not on file  Stress: Not on file  Social Connections: Not on file  Intimate Partner Violence: Not on file    Family History:   Family History  Problem Relation Age of Onset  . Hypertension Mother   . Breast cancer Mother        23's  . Cancer Father        COLON  . Hypertension Sister   . Stroke Sister   . Breast cancer Maternal Aunt        Age 50's  . Cancer Maternal Aunt        Melanoma  . Alzheimer's disease Maternal Aunt   . Cancer Paternal Aunt        OVARIAN and COLON  . Breast cancer Maternal Aunt        70's  . Cancer Maternal Aunt        Colon CA  . Alzheimer's disease Maternal Aunt      ROS:  Please see the history of present illness.  All other ROS reviewed and negative.     Physical Exam/Data:   Vitals:   11/14/20 0924 11/14/20 1428 11/14/20 2036 11/15/20 0515  BP: 129/74 (!) 148/67 (!) 153/60 (!) 124/58  Pulse: 60 (!) 58 (!) 52 (!) 53  Resp: 16 18 20 18   Temp: 98.1 F (36.7 C) 98 F (36.7 C) 98.4 F (36.9 C) 98.6 F (37 C)  TempSrc: Oral Oral Oral Oral  SpO2:  98% 97% 97%  Weight:      Height:        Intake/Output Summary (Last 24 hours) at 11/15/2020 0737 Last data filed at 11/14/2020 1210 Gross per 24 hour  Intake 480 ml  Output --  Net 480 ml   Last 3  Weights 11/14/2020 11/13/2020 11/05/2020  Weight (lbs) 135 lb 12.9 oz 135 lb 136 lb  Weight (kg) 61.6 kg 61.236 kg 61.689 kg     Body mass index is 24.84 kg/m.  General:  Well nourished, well developed, in no acute distress HEENT:  normal Lymph: no adenopathy Neck: no JVD Endocrine:  No thryomegaly Vascular: No carotid bruits; FA pulses 2+ bilaterally without bruits  Cardiac:  normal S1, S2; RRR; no murmur  Lungs:  clear to auscultation bilaterally, no wheezing, rhonchi or rales  Abd: soft, nontender, no hepatomegaly  Ext: no edema Musculoskeletal:  No deformities, BUE and BLE strength normal and equal Skin: warm and dry  Neuro:  CNs 2-12 intact, no focal abnormalities noted Psych:  Normal affect     Laboratory Data:  High Sensitivity Troponin:   Recent Labs  Lab 11/14/20 1502 11/14/20 1645  TROPONINIHS 8 6     Chemistry Recent Labs  Lab 11/13/20 1653 11/14/20 0653  NA 142 140  K 3.7 4.0  CL 107 108  CO2 28 25  GLUCOSE 106* 103*  BUN 19 13  CREATININE 0.81 0.65  CALCIUM 9.2 8.3*  GFRNONAA >60 >60  ANIONGAP 7 7    No results for input(s): PROT, ALBUMIN, AST, ALT, ALKPHOS, BILITOT in the last 168 hours. Hematology Recent Labs  Lab 11/13/20 1653 11/14/20 0653 11/15/20 0442  WBC 9.2 8.3 8.7  RBC 4.74 4.20 4.17  HGB 14.9 13.3 13.2  HCT 45.6 40.7 41.1  MCV 96.2 96.9 98.6  MCH 31.4 31.7 31.7  MCHC 32.7 32.7 32.1  RDW 13.2 13.4 13.6  PLT 225 199 207   BNPNo results for input(s): BNP, PROBNP in the last 168 hours.  DDimer No results for input(s): DDIMER in the last 168 hours.   Radiology/Studies:  CT Angio Chest PE W and/or Wo Contrast  Result Date: 11/13/2020 CLINICAL DATA:  Left leg pain and swelling for 2 days, history of DVT EXAM: CT ANGIOGRAPHY CHEST WITH CONTRAST TECHNIQUE: Multidetector CT imaging of the chest was performed using the standard protocol during bolus administration of intravenous contrast. Multiplanar CT image reconstructions and MIPs  were obtained to evaluate the vascular anatomy. CONTRAST:  150mL OMNIPAQUE IOHEXOL 350 MG/ML SOLN COMPARISON:  11/17/2017 FINDINGS: Cardiovascular: This is a technically adequate evaluation of the pulmonary vasculature. No filling defects or pulmonary emboli. The heart is unremarkable without pericardial effusion. Normal caliber of the thoracic aorta, with mild atherosclerosis. Mediastinum/Nodes: No enlarged mediastinal, hilar, or axillary lymph nodes. Thyroid gland, trachea, and esophagus demonstrate no significant findings. Lungs/Pleura: No acute airspace disease, effusion, or pneumothorax. Central airways are patent. Upper Abdomen: No acute abnormality. Musculoskeletal: No acute or destructive bony lesions. Reconstructed images demonstrate no additional findings. Review of the MIP images confirms the above findings. IMPRESSION: 1. No evidence of pulmonary embolus. 2. No acute intrathoracic process. 3.  Aortic Atherosclerosis (ICD10-I70.0). Electronically Signed   By: Randa Ngo M.D.   On: 11/13/2020 21:12   VAS [place] LOWER EXTREMITY VENOUS (DVT) (ONLY [facility] & [facility])  Result Date: 11/14/2020  Lower Venous DVT Study Indications: History of DVT/PE, pain and swelling of LT calf.  Anticoagulation: Xarelto, switched from Coumadin approximately one year ago. Comparison Study: No recent prior studies available. Performing Technologist: Darlin Coco RDMS,RVT  Examination Guidelines: A complete evaluation includes B-mode imaging, spectral Doppler, color Doppler, and power Doppler as needed of all accessible portions of each vessel. Bilateral testing is considered an integral part of a complete examination. Limited examinations for reoccurring indications may be performed as noted. The reflux portion of the exam is performed with the patient in reverse Trendelenburg.  +-----+---------------+---------+-----------+----------+--------------+ RIGHTCompressibilityPhasicitySpontaneityPropertiesThrombus Aging  +-----+---------------+---------+-----------+----------+--------------+ CFV  Full           Yes      Yes                                 +-----+---------------+---------+-----------+----------+--------------+   +---------+---------------+---------+-----------+---------------+--------------+  LEFT     CompressibilityPhasicitySpontaneityProperties     Thrombus Aging +---------+---------------+---------+-----------+---------------+--------------+ CFV      Partial        Yes      Yes        Fibrin         Chronic                                                    stranding                     +---------+---------------+---------+-----------+---------------+--------------+ SFJ      Partial        Yes      Yes        Fibrin         Chronic                                                    stranding                     +---------+---------------+---------+-----------+---------------+--------------+ FV Prox  Full                                                             +---------+---------------+---------+-----------+---------------+--------------+ FV Mid   Full                                                             +---------+---------------+---------+-----------+---------------+--------------+ FV DistalNone           No       No                        Acute          +---------+---------------+---------+-----------+---------------+--------------+ PFV      Full                                                             +---------+---------------+---------+-----------+---------------+--------------+ POP      None           No       No                        Acute          +---------+---------------+---------+-----------+---------------+--------------+ PTV      Full                                                             +---------+---------------+---------+-----------+---------------+--------------+  PERO     Full                                                              +---------+---------------+---------+-----------+---------------+--------------+ Gastroc  None           No       No                        Acute          +---------+---------------+---------+-----------+---------------+--------------+     Summary: RIGHT: - No evidence of common femoral vein obstruction.  LEFT: - Findings consistent with acute deep vein thrombosis involving the left femoral vein, left popliteal vein, and left gastrocnemius veins. - Findings consistent with chronic deep vein thrombosis involving the left common femoral vein, and SF junction. - No cystic structure found in the popliteal fossa.  *See table(s) above for measurements and observations. Electronically signed by Servando Snare MD on 11/14/2020 at 9:38:40 AM.    Final      Assessment and Plan:   1. Chest pain - history of chronic chest pain - recent episode different from prior symptoms but are atypical and not overally consistent with cardiac chest pain. - negative coronary CTA, nuclear stress, DSE all within the last 3 years, with a neg DSE in 06/2020. Her coronary calcium score from 2019 was actually 0 - nonspecific findings on GXT in setting of baseline ST/T changes and hypertensive response  - in absence of objective evidence of ischemia would not pursue repeat ischemic testing.  - f/u echo , if benign then no further cardiac inpatient workup and cardiogy would sign off.     For questions or updates, please contact [place] Please consult [email or web address] for contact info under    Signed, Carlyle Dolly, MD  11/15/2020 7:37 AM

## 2020-11-15 NOTE — Progress Notes (Signed)
ANTICOAGULATION CONSULT NOTE - Brief Note  Pharmacy Consult for IV heparin  Indication: DVT  Patient Measurements: Height: 5\' 2"  (157.5 cm) Weight: 61.6 kg (135 lb 12.9 oz) IBW/kg (Calculated) : 50.1 Heparin Dosing Weight: TBW  Assessment: See full assessment earlier today by Leone Haven PharmD Briefly:  79 y/oF on heparin for DVT.  PMH of DVT/PE on low dose Xarelto 10mg  PTA.  Heparin level 0.91, remains elevated despite previous hold and rate reduction. --- Added on aPTT returns at 110, also supratherapeutic and correlating with elevated heparin level.  OK to continue using heparin levels for monitoring. Heparin level was drawn from right arm (Heparin infusing through left arm) No bleeding or complications reported.  Goal of Therapy:  Heparin level 0.3-0.7 units/ml  Monitor platelets by anticoagulation protocol: Yes   Plan:   Hold heparin gtt x 1 hr then decrease to heparin gtt 600 units/hr  Check heparin level 8 hrs after heparin resumed at decreased rate  Daily CBC, heparin level   Gretta Arab PharmD, BCPS Clinical Pharmacist [facility] main pharmacy [phone] 11/15/2020 4:04 PM

## 2020-11-15 NOTE — Progress Notes (Signed)
[place] for IV heparin  Indication: DVT  Allergies  Allergen Reactions  . Sulfa Antibiotics Rash    Patient Measurements: Height: 5\' 2"  (157.5 cm) Weight: 61.6 kg (135 lb 12.9 oz) IBW/kg (Calculated) : 50.1 Heparin Dosing Weight: TBW  Vital Signs: Temp: 98.6 F (37 C) (04/17 0515) Temp Source: Oral (04/17 0515) BP: 124/58 (04/17 0515) Pulse Rate: 53 (04/17 0515)  Labs: Recent Labs    11/13/20 1653 11/13/20 2109 11/13/20 2109 11/14/20 0653 11/14/20 1502 11/14/20 1645 11/15/20 0442  HGB 14.9  --   --  13.3  --   --  13.2  HCT 45.6  --   --  40.7  --   --  41.1  PLT 225  --   --  199  --   --  207  APTT  --  24  --  49*  --   --   --   LABPROT  --  13.1  --   --   --   --   --   INR  --  1.0  --   --   --   --   --   HEPARINUNFRC  --  0.14*   < > 0.51 0.59  --  1.07*  CREATININE 0.81  --   --  0.65  --   --   --   TROPONINIHS  --   --   --   --  8 6  --    < > = values in this interval not displayed.    Estimated Creatinine Clearance: 54.9 mL/min (by C-G formula based on SCr of 0.65 mg/dL).   Medications:  PTA Xarelto 10mg  PO daily-last dose reported as 4/14 at 1700  Assessment: 74 y/oF with PMH of DVT/PE on Xarelto PTA who presented with 1 day of left calf swelling. LE venous duplex + for LLE DVT. CTa chest negative for PE. Pharmacy consulted for IV heparin dosing. Baseline CBC WNL. Note that recent Rivaroxaban doses may falsely elevate heparin levels.   Today, 11/15/2020:  Heparin level = 1.07 with heparin gtt @1000  units/hr (patient previously therapeutic on this rate)  Spoke with RN who confirmed with patient that the heparin level was drawn from opposite arm from where heparin was infusing  CBC: stable WNL  No bleeding or infusion issues per RN  Goal of Therapy:  Heparin level 0.3-0.7 units/ml  Monitor platelets by anticoagulation protocol: Yes   Plan:   Hold heparin gtt x 1 hr then resumed heparin gtt @ 850  units/hr  Check heparin level 8 hrs after heparin resumed at decreased rate  Daily CBC, heparin level  Monitor closely for s/sx of bleeding  F/u plans for long-term anticoagulation - Hematology recommending eventual transition to Lovenox, but given failure occurred on prophylactic-dose Xarelto, resuming full-dose Xarelto indefinitely could be an effective alternative option, assuming patient tolerated this dose well previously.   Leone Haven, PharmD 11/15/2020, 5:52 AM

## 2020-11-16 DIAGNOSIS — R079 Chest pain, unspecified: Secondary | ICD-10-CM | POA: Diagnosis not present

## 2020-11-16 LAB — HEPARIN LEVEL (UNFRACTIONATED)
Heparin Unfractionated: 0.42 IU/mL (ref 0.30–0.70)
Heparin Unfractionated: 0.49 IU/mL (ref 0.30–0.70)

## 2020-11-16 LAB — CBC
HCT: 41.4 % (ref 36.0–46.0)
Hemoglobin: 13.6 g/dL (ref 12.0–15.0)
MCH: 31.4 pg (ref 26.0–34.0)
MCHC: 32.9 g/dL (ref 30.0–36.0)
MCV: 95.6 fL (ref 80.0–100.0)
Platelets: 225 10*3/uL (ref 150–400)
RBC: 4.33 MIL/uL (ref 3.87–5.11)
RDW: 13.3 % (ref 11.5–15.5)
WBC: 7.4 10*3/uL (ref 4.0–10.5)
nRBC: 0 % (ref 0.0–0.2)

## 2020-11-16 NOTE — Progress Notes (Signed)
Emma Lopez for IV heparin  Indication: DVT  Allergies  Allergen Reactions  . Sulfa Antibiotics Rash    Patient Measurements: Height: 5\' 2"  (157.5 cm) Weight: 61.6 kg (135 lb 12.9 oz) IBW/kg (Calculated) : 50.1 Heparin Dosing Weight: TBW  Vital Signs: Temp: 98.2 F (36.8 C) (04/18 0620) Temp Source: Oral (04/18 0620) BP: 177/91 (04/18 0620) Pulse Rate: 52 (04/18 0620)  Labs: Recent Labs    11/13/20 1653 11/13/20 1653 11/13/20 2109 11/14/20 0653 11/14/20 1502 11/14/20 1645 11/15/20 0442 11/15/20 1506 11/16/20 0207 11/16/20 1021  HGB 14.9  --   --  13.3  --   --  13.2  --  13.6  --   HCT 45.6  --   --  40.7  --   --  41.1  --  41.4  --   PLT 225  --   --  199  --   --  207  --  225  --   APTT  --   --  24 49*  --   --   --  110*  --   --   LABPROT  --   --  13.1  --   --   --   --   --   --   --   INR  --   --  1.0  --   --   --   --   --   --   --   HEPARINUNFRC  --    < > 0.14* 0.51 0.59  --  1.07* 0.91* 0.42 0.49  CREATININE 0.81  --   --  0.65  --   --   --   --   --   --   TROPONINIHS  --   --   --   --  8 6  --   --   --   --    < > = values in this interval not displayed.    Estimated Creatinine Clearance: 54.9 mL/min (by C-G formula based on SCr of 0.65 mg/dL).   Medications:  PTA Xarelto 10mg  PO daily-last dose reported as 4/14 at 1700  Assessment: 74 y/oF with PMH of DVT/PE on Xarelto PTA who presented with 1 day of left calf swelling. LE venous duplex + for LLE DVT. CTa chest negative for PE. Pharmacy consulted for IV heparin dosing. Baseline CBC WNL. Note that recent Rivaroxaban doses may falsely elevate heparin levels.   Today, 11/16/2020:  0207 amHeparin level = 0.42 (therapeutic) with heparin gtt @ 600 units/hr   1021 am Confirmatory heparin level = 0.49 (therapeutic) with heparin @ 600 units/hr  CBC: WNL  No bleeding or infusion issues noted  Goal of Therapy:  Heparin level 0.3-0.7 units/ml  Monitor  platelets by anticoagulation protocol: Yes   Plan:   Continue heparin gtt @ 600 units/hr  Daily CBC, heparin level  Monitor closely for s/sx of bleeding  F/u plans for long-term anticoagulation - Hematology recommending eventual transition to Lovenox, but given failure occurred on prophylactic-dose Xarelto, resuming full-dose Xarelto indefinitely could be an effective alternative option, assuming patient tolerated this dose well previously.  Eudelia Bunch, Pharm.D 11/16/2020 10:53 AM

## 2020-11-16 NOTE — Progress Notes (Signed)
PROGRESS NOTE    [name]  EGB:[id number] DOB: Sep 28, 1945 DOA: 11/13/2020 PCP: Michael Boston, MD  Outpatient Specialists:   Brief Narrative:  As per H&P done on admission: "Emma Lopez is a 75 y.o. female with medical history significant for DVT, PE, depression, anxiety, fibromyalgia, restless leg syndrome, presenting to the emergency department for evaluation of swelling and tenderness involving the left leg.  Patient reports a remote history of left leg DVT, subsequently developed a right lower extremity DVT and PE in 2009, had been on warfarin until transitioning to Xarelto approximately a year ago, and had been doing well until 2 to 3 days ago when she noted some tenderness behind the left knee she also noticed some mild swelling of the left calf.  She has chronic exertional dyspnea that is unchanged but denies any chest pain or palpitations and denies any hemoptysis.  There has not been any recent prolonged immobilization, no missed Xarelto doses, and no new medications.  She reports undergoing extensive work-up with hematology years ago and no specific etiology for her VTE was identified.  Her sister has also had DVTs.  ED Course: Upon arrival to the ED, patient is found to be afebrile, saturating well on room air, and with stable blood pressure.  EKG features sinus rhythm with nonspecific IVCD and repolarization abnormality.  Lower extremity venous Doppler notable for acute DVT involving the left femoral, popliteal, and gastrocnemius veins with chronic clot involving the left and SF. CTA chest was negative for PE.  Chemistry panel and CBC unremarkable.  ED PA discussed the case with hematologist and it was recommended that patient be admitted, started on IV heparin, and then transition to Lovenox".  11/14/2020: Patient seen.  Left lower extremity swelling and pain have resolved.  CTA chest is negative for pulmonary embolism.  Patient is currently on heparin drip.  Xarelto has been  discontinued.  Patient reported chest pain today.  Chronic, patient has had extensive cardiac work-up.  Cardiology attending, Dr. Harl Bowie, has been consulted (I personally discussed the case with Dr. Harl Bowie).  We will change patient to inpatient.  Will place patient on telemetry.  EKG has been ordered and reviewed.  EKG revealed poor R wave progression and sinus bradycardia.  Troponin is pending.  Patient is already on heparin.  Aspirin added.  Sublingual nitro also added.  PPI was added.  11/15/2020: Patient seen.  Chest pain has resolved.  Troponins came back negative.  Patient remains on IV heparin.  Will discuss further care with hematology/oncology team tomorrow.  On presentation, patient's care was discussed with Dr. Marin Olp.  11/16/2020: Patient seen.  No new complaints.  Patient has remained stable.  Awaiting hematology input.  Communicated with Dr. Marin Olp.  Hopefully, Dr. Antonieta Pert partner will see patient tomorrow.  Assessment & Plan:   Principal Problem:   Acute DVT (deep venous thrombosis) (HCC) Active Problems:   DVT (deep venous thrombosis) (HCC)   Fibromyalgia   RLS (restless legs syndrome)   Depression with anxiety   1. 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  Patient is tolerating heparin.  11/16/2020: Hematology team to direct further care.   2. Depression, anxiety  - Stable, continue Lexapro and as-needed Xanax    3. RLS  - Continue Requip   4.  Chest pain: -Patient is known to Dr. Fransico Him, local cardiologist. -Patient has undergone extensive cardiac work-up in the last 3 years according to the  patient. -Cardiology team consulted.  Discussed with Dr. Harl Bowie, cardiologist. -EKG revealed poor R wave progression. -Troponin is pending. -Continue heparin drip. -Aspirin and Protonix. -Sublingual nitro as needed. -Patient reports intermittent pressure-like chest pain without associated shortness of breath, nausea or vomiting. 11/15/2020: Chest pain has resolved.  Negative troponin.  Echocardiogram revealed normal left ventricular ejection fraction, indeterminate left ventricular diastolic parameters.  DVT prophylaxis: IV heparin Code Status: Full code Family Communication:  Disposition Plan: Home eventual   Consultants:   None  Procedures:   None  Antimicrobials:   None   Subjective: No chest pain. No leg pain  Objective: Vitals:   11/15/20 1328 11/15/20 2228 11/16/20 0620 11/16/20 1339  BP: (!) 123/55 (!) 152/72 (!) 177/91 135/68  Pulse: (!) 54 (!) 59 (!) 52 (!) 54  Resp: 16  17 14   Temp: 98.8 F (37.1 C) 98 F (36.7 C) 98.2 F (36.8 C) 98.1 F (36.7 C)  TempSrc: Oral Oral Oral Oral  SpO2: 97% 94% 98% 97%  Weight:      Height:       No intake or output data in the 24 hours ending 11/16/20 1621 Filed Weights   11/13/20 1640 11/14/20 0453  Weight: 61.2 kg 61.6 kg    Examination:  General exam: Appears calm and comfortable.  Patient is denying any distress. Respiratory system: Clear to auscultation. Respiratory effort normal. Cardiovascular system: S1 & S2 with mild bradycardia.   Gastrointestinal system: Abdomen is nondistended, soft and nontender. No organomegaly or masses felt. Normal bowel sounds heard. Central nervous system: Alert and oriented. No focal neurological deficits.  Patient moves all extremities. Extremities: No leg edema.  Data Reviewed: I have personally reviewed following labs and imaging studies  CBC: Recent Labs  Lab 11/13/20 1653 11/14/20 0653 11/15/20 0442 11/16/20 0207  WBC 9.2 8.3 8.7 7.4  HGB 14.9 13.3 13.2 13.6   HCT 45.6 40.7 41.1 41.4  MCV 96.2 96.9 98.6 95.6  PLT 225 199 207 130   Basic Metabolic Panel: Recent Labs  Lab 11/13/20 1653 11/14/20 0653  NA 142 140  K 3.7 4.0  CL 107 108  CO2 28 25  GLUCOSE 106* 103*  BUN 19 13  CREATININE 0.81 0.65  CALCIUM 9.2 8.3*   GFR: Estimated Creatinine Clearance: 54.9 mL/min (by C-G formula based on SCr of 0.65 mg/dL). Liver Function Tests: No results for input(s): AST, ALT, ALKPHOS, BILITOT, PROT, ALBUMIN in the last 168 hours. No results for input(s): LIPASE, AMYLASE in the last 168 hours. No results for input(s): AMMONIA in the last 168 hours. Coagulation Profile: Recent Labs  Lab 11/13/20 2109  INR 1.0   Cardiac Enzymes: No results for input(s): CKTOTAL, CKMB, CKMBINDEX, TROPONINI in the last 168 hours. BNP (last 3 results) No results for input(s): PROBNP in the last 8760 hours. HbA1C: No results for input(s): HGBA1C in the last 72 hours. CBG: No results for input(s): GLUCAP in the last 168 hours. Lipid Profile: No results for input(s): CHOL, HDL, LDLCALC, TRIG, CHOLHDL, LDLDIRECT in the last 72 hours. Thyroid Function Tests: No results for input(s): TSH, T4TOTAL, FREET4, T3FREE, THYROIDAB in the last 72 hours. Anemia  Panel: No results for input(s): VITAMINB12, FOLATE, FERRITIN, TIBC, IRON, RETICCTPCT in the last 72 hours. Urine analysis:    Component Value Date/Time   COLORURINE YELLOW 11/05/2020 1459   APPEARANCEUR CLOUDY (A) 11/05/2020 1459   LABSPEC 1.020 11/05/2020 1459   PHURINE 5.0 11/05/2020 1459   GLUCOSEU NEGATIVE 11/05/2020 1459   HGBUR 1+ (A) 11/05/2020 1459   BILIRUBINUR NEGATIVE 11/14/2017 1422   KETONESUR NEGATIVE 11/05/2020 1459   PROTEINUR NEGATIVE 11/05/2020 1459   UROBILINOGEN 0.2 02/27/2014 1239   NITRITE NEGATIVE 11/14/2017 1422   LEUKOCYTESUR NEGATIVE 11/14/2017 1422   Sepsis Labs: @LABRCNTIP (procalcitonin:4,lacticidven:4)  ) Recent Results (from the past 240 hour(s))  Resp Panel by RT-PCR (Flu  A&B, Covid) Nasopharyngeal Swab     Status: None   Collection Time: 11/13/20  7:08 PM   Specimen: Nasopharyngeal Swab; Nasopharyngeal(NP) swabs in vial transport medium  Result Value Ref Range Status   SARS Coronavirus 2 by RT PCR NEGATIVE NEGATIVE Final    Comment: (NOTE) SARS-CoV-2 target nucleic acids are NOT DETECTED.  The SARS-CoV-2 RNA is generally detectable in upper respiratory specimens during the acute phase of infection. The lowest concentration of SARS-CoV-2 viral copies this assay can detect is 138 copies/mL. A negative result does not preclude SARS-Cov-2 infection and should not be used as the sole basis for treatment or other patient management decisions. A negative result may occur with  improper specimen collection/handling, submission of specimen other than nasopharyngeal swab, presence of viral mutation(s) within the areas targeted by this assay, and inadequate number of viral copies(<138 copies/mL). A negative result must be combined with clinical observations, patient history, and epidemiological information. The expected result is Negative.  Fact Sheet for Patients:  EntrepreneurPulse.com.au  Fact Sheet for Healthcare Providers:  IncredibleEmployment.be  This test is no t yet approved or cleared by the [place] FDA and  has been authorized for detection and/or diagnosis of SARS-CoV-2 by FDA under an Emergency Use Authorization (EUA). This EUA will remain  in effect (meaning this test can be used) for the duration of the COVID-19 declaration under Section 564(b)(1) of the Act, 21 U.S.C.section 360bbb-3(b)(1), unless the authorization is terminated  or revoked sooner.       Influenza A by PCR NEGATIVE NEGATIVE Final   Influenza B by PCR NEGATIVE NEGATIVE Final    Comment: (NOTE) The Xpert Xpress SARS-CoV-2/FLU/RSV plus assay is intended as an aid in the diagnosis of influenza from Nasopharyngeal swab specimens  and should not be used as a sole basis for treatment. Nasal washings and aspirates are unacceptable for Xpert Xpress SARS-CoV-2/FLU/RSV testing.  Fact Sheet for Patients: EntrepreneurPulse.com.au  Fact Sheet for Healthcare Providers: IncredibleEmployment.be  This test is not yet approved or cleared by the [place] FDA and has been authorized for detection and/or diagnosis of SARS-CoV-2 by FDA under an Emergency Use Authorization (EUA). This EUA will remain in effect (meaning this test can be used) for the duration of the COVID-19 declaration under Section 564(b)(1) of the Act, 21 U.S.C. section 360bbb-3(b)(1), unless the authorization is terminated or revoked.  Performed at [facility], [place] [address], [place], [place] [address]          Radiology Studies: ECHOCARDIOGRAM COMPLETE  Result Date: 11/15/2020    ECHOCARDIOGRAM REPORT   Patient Name:   Emma Lopez Date of Exam: 11/15/2020 Medical Rec #:  [id number]     Height:       62.0 in Accession #:    [id number]  Weight:       135.8 lb Date of Birth:  06-Mar-1946     BSA:          1.622 m Patient Age:    64 years      BP:           124/58 mmHg Patient Gender: F             HR:           63 bpm. Exam Location:  Inpatient Procedure: 2D Echo, Cardiac Doppler and Color Doppler Indications:    R07.9* Chest pain, unspecified  History:        Patient has prior history of Echocardiogram examinations, most                 recent 12/08/2017. Risk Factors:Dyslipidemia. Pulmonary Embolus.                 Thyroid Disease. GERD.  Sonographer:    Tiffany Dance Referring Phys: [id number] [place]  1. Left ventricular ejection fraction, by estimation, is 60 to 65%. The left ventricle has normal function. The left ventricle has no regional wall motion abnormalities. Left ventricular diastolic parameters are indeterminate.  2. Right ventricular systolic function is normal. The  right ventricular size is normal. There is normal pulmonary artery systolic pressure.  3. The mitral valve is normal in structure. Trivial mitral valve regurgitation. No evidence of mitral stenosis.  4. The aortic valve has an indeterminant number of cusps. Aortic valve regurgitation is not visualized. No aortic stenosis is present.  5. The inferior vena cava is normal in size with greater than 50% respiratory variability, suggesting right atrial pressure of 3 mmHg. FINDINGS  Left Ventricle: Left ventricular ejection fraction, by estimation, is 60 to 65%. The left ventricle has normal function. The left ventricle has no regional wall motion abnormalities. The left ventricular internal cavity size was normal in size. There is  no left ventricular hypertrophy. Left ventricular diastolic parameters are indeterminate. Right Ventricle: The right ventricular size is normal. No increase in right ventricular wall thickness. Right ventricular systolic function is normal. There is normal pulmonary artery systolic pressure. The tricuspid regurgitant velocity is 2.41 m/s, and  with an assumed right atrial pressure of 8 mmHg, the estimated right ventricular systolic pressure is 29.9 mmHg. Left Atrium: Left atrial size was normal in size. Right Atrium: Right atrial size was normal in size. Pericardium: There is no evidence of pericardial effusion. Mitral Valve: The mitral valve is normal in structure. Trivial mitral valve regurgitation. No evidence of mitral valve stenosis. Tricuspid Valve: The tricuspid valve is normal in structure. Tricuspid valve regurgitation is trivial. No evidence of tricuspid stenosis. Aortic Valve: The aortic valve has an indeterminant number of cusps. Aortic valve regurgitation is not visualized. No aortic stenosis is present. Pulmonic Valve: The pulmonic valve was not well visualized. Pulmonic valve regurgitation is trivial. No evidence of pulmonic stenosis. Aorta: The aortic root is normal in size and  structure. Pulmonary Artery: Indeterminant PASP, inadequate TR jet. Venous: The inferior vena cava is normal in size with greater than 50% respiratory variability, suggesting right atrial pressure of 3 mmHg. IAS/Shunts: No atrial level shunt detected by color flow Doppler.  LEFT VENTRICLE PLAX 2D LVIDd:         3.90 cm  Diastology LVIDs:         2.50 cm  LV e' medial:    5.87 cm/s LV PW:  1.10 cm  LV E/e' medial:  14.1 LV IVS:        1.00 cm  LV e' lateral:   7.62 cm/s LVOT diam:     1.90 cm  LV E/e' lateral: 10.8 LV SV:         74 LV SV Index:   46 LVOT Area:     2.84 cm  RIGHT VENTRICLE             IVC RV Basal diam:  2.90 cm     IVC diam: 2.10 cm RV S prime:     14.70 cm/s TAPSE (M-mode): 2.3 cm LEFT ATRIUM             Index       RIGHT ATRIUM           Index LA diam:        3.80 cm 2.34 cm/m  RA Area:     11.00 cm LA Vol (A2C):   50.3 ml 31.02 ml/m RA Volume:   20.30 ml  12.52 ml/m LA Vol (A4C):   38.8 ml 23.93 ml/m LA Biplane Vol: 44.5 ml 27.44 ml/m  AORTIC VALVE LVOT Vmax:   117.08 cm/s LVOT Vmean:  73.418 cm/s LVOT VTI:    0.262 m  AORTA Ao Root diam: 2.80 cm Ao Asc diam:  3.10 cm MITRAL VALVE               TRICUSPID VALVE MV Area (PHT): 2.54 cm    TR Peak grad:   23.2 mmHg MV Decel Time: 299 msec    TR Vmax:        241.00 cm/s MV E velocity: 82.50 cm/s MV A velocity: 82.50 cm/s  SHUNTS MV E/A ratio:  1.00        Systemic VTI:  0.26 m                            Systemic Diam: 1.90 cm Carlyle Dolly MD Electronically signed by Carlyle Dolly MD Signature Date/Time: 11/15/2020/11:29:34 AM    Final         Scheduled Meds: . escitalopram  20 mg Oral Daily  . rOPINIRole  1 mg Oral QHS   Continuous Infusions: . heparin 600 Units/hr (11/16/20 1149)     LOS: 2 days    Time spent: 25 minutes.    Dana Allan, MD  [facility] Pager #: [phone] 7PM-7AM contact night coverage as above

## 2020-11-16 NOTE — Progress Notes (Signed)
[place] for IV heparin  Indication: DVT  Allergies  Allergen Reactions  . Sulfa Antibiotics Rash    Patient Measurements: Height: 5\' 2"  (157.5 cm) Weight: 61.6 kg (135 lb 12.9 oz) IBW/kg (Calculated) : 50.1 Heparin Dosing Weight: TBW  Vital Signs: Temp: 98 F (36.7 C) (04/17 2228) Temp Source: Oral (04/17 2228) BP: 152/72 (04/17 2228) Pulse Rate: 59 (04/17 2228)  Labs: Recent Labs    11/13/20 1653 11/13/20 1653 11/13/20 2109 11/14/20 0653 11/14/20 1502 11/14/20 1645 11/15/20 0442 11/15/20 1506 11/16/20 0207  HGB 14.9  --   --  13.3  --   --  13.2  --  13.6  HCT 45.6  --   --  40.7  --   --  41.1  --  41.4  PLT 225  --   --  199  --   --  207  --  225  APTT  --   --  24 49*  --   --   --  110*  --   LABPROT  --   --  13.1  --   --   --   --   --   --   INR  --   --  1.0  --   --   --   --   --   --   HEPARINUNFRC  --    < > 0.14* 0.51 0.59  --  1.07* 0.91* 0.42  CREATININE 0.81  --   --  0.65  --   --   --   --   --   TROPONINIHS  --   --   --   --  8 6  --   --   --    < > = values in this interval not displayed.    Estimated Creatinine Clearance: 54.9 mL/min (by C-G formula based on SCr of 0.65 mg/dL).   Medications:  PTA Xarelto 10mg  PO daily-last dose reported as 4/14 at 1700  Assessment: 74 y/oF with PMH of DVT/PE on Xarelto PTA who presented with 1 day of left calf swelling. LE venous duplex + for LLE DVT. CTa chest negative for PE. Pharmacy consulted for IV heparin dosing. Baseline CBC WNL. Note that recent Rivaroxaban doses may falsely elevate heparin levels.   Today, 11/16/2020:  Heparin level = 0.42 (therapeutic) with heparin gtt @ 800 units/hr   CBC: WNL  No bleeding or infusion issues noted  Goal of Therapy:  Heparin level 0.3-0.7 units/ml  Monitor platelets by anticoagulation protocol: Yes   Plan:   Continue heparin gtt @ 800 units/hr  Recheck heparin level in 8 hrs to confirm therapeutic dose    Daily CBC, heparin level  Monitor closely for s/sx of bleeding  F/u plans for long-term anticoagulation - Hematology recommending eventual transition to Lovenox, but given failure occurred on prophylactic-dose Xarelto, resuming full-dose Xarelto indefinitely could be an effective alternative option, assuming patient tolerated this dose well previously.   Leone Haven, PharmD 11/16/2020, 4:37 AM

## 2020-11-16 NOTE — Progress Notes (Addendum)
Progress Note  Patient Name: Emma Lopez Date of Encounter: 11/16/2020  [facility] Cardiologist: Fransico Him, MD   Subjective   Has a little pressure, wonders if these sx are from reflux or [facility]  Inpatient Medications    Scheduled Meds: . escitalopram  20 mg Oral Daily  . rOPINIRole  1 mg Oral QHS   Continuous Infusions: . heparin 600 Units/hr (11/15/20 1728)   PRN Meds: acetaminophen **OR** acetaminophen, ALPRAZolam, alum & mag hydroxide-simeth, dicyclomine, HYDROcodone-acetaminophen, nitroGLYCERIN, ondansetron **OR** ondansetron (ZOFRAN) IV, polyethylene glycol   Vital Signs    Vitals:   11/15/20 0515 11/15/20 1328 11/15/20 2228 11/16/20 0620  BP: (!) 124/58 (!) 123/55 (!) 152/72 (!) 177/91  Pulse: (!) 53 (!) 54 (!) 59 (!) 52  Resp: 18 16  17   Temp: 98.6 F (37 C) 98.8 F (37.1 C) 98 F (36.7 C) 98.2 F (36.8 C)  TempSrc: Oral Oral Oral Oral  SpO2: 97% 97% 94% 98%  Weight:      Height:        Intake/Output Summary (Last 24 hours) at 11/16/2020 0756 Last data filed at 11/15/2020 1330 Gross per 24 hour  Intake 480 ml  Output --  Net 480 ml   Last 3 Weights 11/14/2020 11/13/2020 11/05/2020  Weight (lbs) 135 lb 12.9 oz 135 lb 136 lb  Weight (kg) 61.6 kg 61.236 kg 61.689 kg      Telemetry    SR - Personally Reviewed  ECG    None today - Personally Reviewed  Physical Exam   GEN: No acute distress.   Neck: No JVD Cardiac: RRR, no murmurs, rubs, or gallops.  Respiratory: Clear to auscultation bilaterally. GI: Soft, nontender, non-distended  MS: No edema; No deformity. Neuro:  Nonfocal  Psych: Normal affect   Labs    High Sensitivity Troponin:   Recent Labs  Lab 11/14/20 1502 11/14/20 1645  TROPONINIHS 8 6      Chemistry Recent Labs  Lab 11/13/20 1653 11/14/20 0653  NA 142 140  K 3.7 4.0  CL 107 108  CO2 28 25  GLUCOSE 106* 103*  BUN 19 13  CREATININE 0.81 0.65  CALCIUM 9.2 8.3*  GFRNONAA >60 >60  ANIONGAP 7 7      Hematology Recent Labs  Lab 11/14/20 0653 11/15/20 0442 11/16/20 0207  WBC 8.3 8.7 7.4  RBC 4.20 4.17 4.33  HGB 13.3 13.2 13.6  HCT 40.7 41.1 41.4  MCV 96.9 98.6 95.6  MCH 31.7 31.7 31.4  MCHC 32.7 32.1 32.9  RDW 13.4 13.6 13.3  PLT 199 207 225    BNPNo results for input(s): BNP, PROBNP in the last 168 hours.   DDimer No results for input(s): DDIMER in the last 168 hours.   No results found for: CHOL, HDL, LDLCALC, LDLDIRECT, TRIG, CHOLHDL Lab Results  Component Value Date   ALT 21 02/27/2009   AST 25 02/27/2009   ALKPHOS 83 02/27/2009   BILITOT 0.9 02/27/2009   No results found for: TSH No results found for: HGBA1C   Radiology    ECHOCARDIOGRAM COMPLETE  Result Date: 11/15/2020    ECHOCARDIOGRAM REPORT   Patient Name:   Emma Lopez Date of Exam: 11/15/2020 Medical Rec #:  [id number]     Height:       62.0 in Accession #:    [id number]    Weight:       135.8 lb Date of Birth:  1945-10-09     BSA:  1.622 m Patient Age:    75 years      BP:           124/58 mmHg Patient Gender: F             HR:           63 bpm. Exam Location:  Inpatient Procedure: 2D Echo, Cardiac Doppler and Color Doppler Indications:    R07.9* Chest pain, unspecified  History:        Patient has prior history of Echocardiogram examinations, most                 recent 12/08/2017. Risk Factors:Dyslipidemia. Pulmonary Embolus.                 Thyroid Disease. GERD.  Sonographer:    Tiffany Dance Referring Phys: [id number] [place]  1. Left ventricular ejection fraction, by estimation, is 60 to 65%. The left ventricle has normal function. The left ventricle has no regional wall motion abnormalities. Left ventricular diastolic parameters are indeterminate.  2. Right ventricular systolic function is normal. The right ventricular size is normal. There is normal pulmonary artery systolic pressure.  3. The mitral valve is normal in structure. Trivial mitral valve regurgitation. No evidence  of mitral stenosis.  4. The aortic valve has an indeterminant number of cusps. Aortic valve regurgitation is not visualized. No aortic stenosis is present.  5. The inferior vena cava is normal in size with greater than 50% respiratory variability, suggesting right atrial pressure of 3 mmHg. FINDINGS  Left Ventricle: Left ventricular ejection fraction, by estimation, is 60 to 65%. The left ventricle has normal function. The left ventricle has no regional wall motion abnormalities. The left ventricular internal cavity size was normal in size. There is  no left ventricular hypertrophy. Left ventricular diastolic parameters are indeterminate. Right Ventricle: The right ventricular size is normal. No increase in right ventricular wall thickness. Right ventricular systolic function is normal. There is normal pulmonary artery systolic pressure. The tricuspid regurgitant velocity is 2.41 m/s, and  with an assumed right atrial pressure of 8 mmHg, the estimated right ventricular systolic pressure is 32.3 mmHg. Left Atrium: Left atrial size was normal in size. Right Atrium: Right atrial size was normal in size. Pericardium: There is no evidence of pericardial effusion. Mitral Valve: The mitral valve is normal in structure. Trivial mitral valve regurgitation. No evidence of mitral valve stenosis. Tricuspid Valve: The tricuspid valve is normal in structure. Tricuspid valve regurgitation is trivial. No evidence of tricuspid stenosis. Aortic Valve: The aortic valve has an indeterminant number of cusps. Aortic valve regurgitation is not visualized. No aortic stenosis is present. Pulmonic Valve: The pulmonic valve was not well visualized. Pulmonic valve regurgitation is trivial. No evidence of pulmonic stenosis. Aorta: The aortic root is normal in size and structure. Pulmonary Artery: Indeterminant PASP, inadequate TR jet. Venous: The inferior vena cava is normal in size with greater than 50% respiratory variability, suggesting  right atrial pressure of 3 mmHg. IAS/Shunts: No atrial level shunt detected by color flow Doppler.  LEFT VENTRICLE PLAX 2D LVIDd:         3.90 cm  Diastology LVIDs:         2.50 cm  LV e' medial:    5.87 cm/s LV PW:         1.10 cm  LV E/e' medial:  14.1 LV IVS:        1.00 cm  LV e' lateral:  7.62 cm/s LVOT diam:     1.90 cm  LV E/e' lateral: 10.8 LV SV:         74 LV SV Index:   46 LVOT Area:     2.84 cm  RIGHT VENTRICLE             IVC RV Basal diam:  2.90 cm     IVC diam: 2.10 cm RV S prime:     14.70 cm/s TAPSE (M-mode): 2.3 cm LEFT ATRIUM             Index       RIGHT ATRIUM           Index LA diam:        3.80 cm 2.34 cm/m  RA Area:     11.00 cm LA Vol (A2C):   50.3 ml 31.02 ml/m RA Volume:   20.30 ml  12.52 ml/m LA Vol (A4C):   38.8 ml 23.93 ml/m LA Biplane Vol: 44.5 ml 27.44 ml/m  AORTIC VALVE LVOT Vmax:   117.08 cm/s LVOT Vmean:  73.418 cm/s LVOT VTI:    0.262 m  AORTA Ao Root diam: 2.80 cm Ao Asc diam:  3.10 cm MITRAL VALVE               TRICUSPID VALVE MV Area (PHT): 2.54 cm    TR Peak grad:   23.2 mmHg MV Decel Time: 299 msec    TR Vmax:        241.00 cm/s MV E velocity: 82.50 cm/s MV A velocity: 82.50 cm/s  SHUNTS MV E/A ratio:  1.00        Systemic VTI:  0.26 m                            Systemic Diam: 1.90 cm Carlyle Dolly MD Electronically signed by Carlyle Dolly MD Signature Date/Time: 11/15/2020/11:29:34 AM    Final     Cardiac Studies   ECHO: 11/15/2020 1. Left ventricular ejection fraction, by estimation, is 60 to 65%. The  left ventricle has normal function. The left ventricle has no regional  wall motion abnormalities. Left ventricular diastolic parameters are  indeterminate.  2. Right ventricular systolic function is normal. The right ventricular  size is normal. There is normal pulmonary artery systolic pressure.  3. The mitral valve is normal in structure. Trivial mitral valve  regurgitation. No evidence of mitral stenosis.  4. The aortic valve has an  indeterminant number of cusps. Aortic valve  regurgitation is not visualized. No aortic stenosis is present.  5. The inferior vena cava is normal in size with greater than 50%  respiratory variability, suggesting right atrial pressure of 3 mmHg.   Patient Profile     75 y.o. female w/ hx DVT/PE, chronic chest pain w/ cardiac CT w/ no CAD and calcium score 0 in 2019, GXT 08/2020 w/ HTN response causing increased ST depression, fibromyalgia, HLD, bradycardia, was admitted 04/15 w/ CP. Per Dr Harl Bowie, no further workup if echo nl.  Assessment & Plan    1. Chest pain - had nl MV 06/2020 - ez neg MI - Echo normal - atypical sx - no further workup indicated - possible GI origin for sx, ok to try Mylanta if sx recur  [facility] will sign off.   Medication Recommendations:  Continue current therapy Other recommendations (labs, testing, etc):  None Follow up as an outpatient:  With Dr Radford Pax as scheduled  For questions or updates, please contact [place] Please consult [email or web address] for contact info under        Signed, Rosaria Ferries, PA-C  11/16/2020, 7:56 AM    Patient seen and examined with Rosaria Ferries PA-C.  Agree as above, with the following exceptions and changes as noted below. She feels well overall and feels she is stable from cardiac perspective.. Gen: NAD, CV: RRR, Lungs: clear, Abd: soft, Extrem: no edema, Neuro/Psych: alert and oriented x 3, normal mood and affect. All available labs, radiology testing, previous records reviewed. Agree with recommendations as above, CV will sign off and will arrange follow up.  Elouise Munroe, MD 11/16/20 2:17 PM

## 2020-11-17 ENCOUNTER — Inpatient Hospital Stay (HOSPITAL_COMMUNITY): Payer: PPO

## 2020-11-17 ENCOUNTER — Encounter (HOSPITAL_COMMUNITY): Payer: Self-pay | Admitting: Internal Medicine

## 2020-11-17 DIAGNOSIS — I82532 Chronic embolism and thrombosis of left popliteal vein: Secondary | ICD-10-CM | POA: Diagnosis not present

## 2020-11-17 DIAGNOSIS — I824Z2 Acute embolism and thrombosis of unspecified deep veins of left distal lower extremity: Secondary | ICD-10-CM

## 2020-11-17 DIAGNOSIS — I82512 Chronic embolism and thrombosis of left femoral vein: Secondary | ICD-10-CM

## 2020-11-17 DIAGNOSIS — I82562 Chronic embolism and thrombosis of left calf muscular vein: Secondary | ICD-10-CM | POA: Diagnosis not present

## 2020-11-17 DIAGNOSIS — Z803 Family history of malignant neoplasm of breast: Secondary | ICD-10-CM

## 2020-11-17 DIAGNOSIS — Z832 Family history of diseases of the blood and blood-forming organs and certain disorders involving the immune mechanism: Secondary | ICD-10-CM | POA: Diagnosis not present

## 2020-11-17 DIAGNOSIS — Z800 Family history of malignant neoplasm of digestive organs: Secondary | ICD-10-CM

## 2020-11-17 DIAGNOSIS — F418 Other specified anxiety disorders: Secondary | ICD-10-CM

## 2020-11-17 DIAGNOSIS — I700 Atherosclerosis of aorta: Secondary | ICD-10-CM

## 2020-11-17 DIAGNOSIS — Z8041 Family history of malignant neoplasm of ovary: Secondary | ICD-10-CM

## 2020-11-17 DIAGNOSIS — Z7901 Long term (current) use of anticoagulants: Secondary | ICD-10-CM

## 2020-11-17 DIAGNOSIS — I82402 Acute embolism and thrombosis of unspecified deep veins of left lower extremity: Secondary | ICD-10-CM

## 2020-11-17 LAB — CBC
HCT: 41.6 % (ref 36.0–46.0)
Hemoglobin: 13.8 g/dL (ref 12.0–15.0)
MCH: 31.9 pg (ref 26.0–34.0)
MCHC: 33.2 g/dL (ref 30.0–36.0)
MCV: 96.3 fL (ref 80.0–100.0)
Platelets: 247 10*3/uL (ref 150–400)
RBC: 4.32 MIL/uL (ref 3.87–5.11)
RDW: 13.4 % (ref 11.5–15.5)
WBC: 7.4 10*3/uL (ref 4.0–10.5)
nRBC: 0 % (ref 0.0–0.2)

## 2020-11-17 LAB — TSH: TSH: 0.411 u[IU]/mL (ref 0.350–4.500)

## 2020-11-17 LAB — HEPARIN LEVEL (UNFRACTIONATED): Heparin Unfractionated: 0.23 IU/mL — ABNORMAL LOW (ref 0.30–0.70)

## 2020-11-17 MED ORDER — RIVAROXABAN 20 MG PO TABS: 20.0000 mg | ORAL_TABLET | Freq: Every day | ORAL | 0 refills | Status: DC

## 2020-11-17 MED ORDER — RIVAROXABAN 20 MG PO TABS
20.0000 mg | ORAL_TABLET | Freq: Every day | ORAL | Status: DC
  Administered 2020-11-17: 20 mg via ORAL
  Filled 2020-11-17: qty 1

## 2020-11-17 MED ORDER — IOHEXOL 300 MG/ML  SOLN
100.0000 mL | Freq: Once | INTRAMUSCULAR | Status: AC | PRN
  Administered 2020-11-17: 100 mL via INTRAVENOUS

## 2020-11-17 MED ORDER — PANTOPRAZOLE SODIUM 40 MG PO TBEC: 40.0000 mg | DELAYED_RELEASE_TABLET | Freq: Every day | ORAL | Status: DC

## 2020-11-17 MED ORDER — IOHEXOL 9 MG/ML PO SOLN
ORAL | Status: AC
  Filled 2020-11-17: qty 1000

## 2020-11-17 MED ORDER — IOHEXOL 9 MG/ML PO SOLN
500.0000 mL | ORAL | Status: AC
  Administered 2020-11-17 (×2): 500 mL via ORAL

## 2020-11-17 NOTE — Progress Notes (Signed)
[place] for IV heparin  Indication: DVT  Allergies  Allergen Reactions  . Sulfa Antibiotics Rash    Patient Measurements: Height: 5\' 2"  (157.5 cm) Weight: 61.6 kg (135 lb 12.9 oz) IBW/kg (Calculated) : 50.1 Heparin Dosing Weight: TBW  Vital Signs: Temp: 98.2 F (36.8 C) (04/19 0537) Temp Source: Oral (04/18 2239) BP: 123/66 (04/19 0537) Pulse Rate: 52 (04/19 0537)  Labs: Recent Labs    11/14/20 1502 11/14/20 1502 11/14/20 1645 11/15/20 0442 11/15/20 1506 11/16/20 0207 11/16/20 1021 11/17/20 0611  HGB  --    < >  --  13.2  --  13.6  --  13.8  HCT  --   --   --  41.1  --  41.4  --  41.6  PLT  --   --   --  207  --  225  --  247  APTT  --   --   --   --  110*  --   --   --   HEPARINUNFRC 0.59  --   --  1.07* 0.91* 0.42 0.49 0.23*  TROPONINIHS 8  --  6  --   --   --   --   --    < > = values in this interval not displayed.    Estimated Creatinine Clearance: 54.9 mL/min (by C-G formula based on SCr of 0.65 mg/dL).   Medications:  PTA Xarelto 10mg  PO daily-last dose reported as 4/14 at 1700  Assessment: 74 y/oF with PMH of DVT/PE on Xarelto PTA who presented with 1 day of left calf swelling. LE venous duplex + for LLE DVT. CTa chest negative for PE. Pharmacy consulted for IV heparin dosing. Baseline CBC WNL. Note that recent Rivaroxaban doses may falsely elevate heparin levels.   Today, 11/17/2020:  Heparin level = 0.23 (subtherapeutic) with heparin gtt @ 600 units/hr   CBC: WNL  No bleeding or infusion issues noted  Goal of Therapy:  Heparin level 0.3-0.7 units/ml  Monitor platelets by anticoagulation protocol: Yes   Plan:   increase heparin to 700 units/hr and check 8 hr heparin level  Daily CBC, heparin level  Monitor closely for s/sx of bleeding  F/u plans for long-term anticoagulation - Hematology recommending eventual transition to Lovenox, but given failure occurred on prophylactic-dose Xarelto, resuming  full-dose Xarelto indefinitely could be an effective alternative option, assuming patient tolerated this dose well previously.  Eudelia Bunch, Pharm.D 11/17/2020 7:09 AM

## 2020-11-17 NOTE — Discharge Summary (Addendum)
Physician Discharge Summary  Emma Lopez PPI:[id number] DOB: 06-20-1946   PCP: Michael Boston, MD  Admit date: 11/13/2020 Discharge date: 11/17/2020 Length of Stay: 3 days   Code Status: Full Code  Admitted From:  Home Discharged to:   [address]:  None  Equipment/Devices:  None Discharge Condition:  Stable  Recommendations for Outpatient Follow-up   1. Follow up with hematology/oncology 2. Consider repeat lower extremity US in 3 months  3. Recommend outpatient lipid panel - aortic atherosclerosis on CT prior to discharge  Hospital Summary  This is a 75 year old female with a past medical history of DVT/PE on chronic Xarelto 10 mg, depression, anxiety, fibromyalgia, restless leg syndrome who presented to the ED with several days of left lower extremity swelling and tenderness.  She did not have any prolonged immobilization, did not miss any Xarelto doses and has not had any new medications.  ED Course:Upon arrival to the ED, patient is found to be afebrile, saturating well on room air, and with stable blood pressure. EKG features sinus rhythm with nonspecific IVCD and repolarization abnormality. Lower extremity venous Doppler notable for acute DVT involving the left femoral, popliteal, and gastrocnemius veins with chronic clot involving the left and SF.CTA chest was negative for PE. Chemistry panel and CBC unremarkable.  CTA chest negative for PE.  On 4/16 the patient complained of chest pain.  EKG revealed poor R wave progression and sinus bradycardia and aspirin was added along with sublingual nitro and a PPI.  Troponins were negative and cardiology was consulted.  Echo was obtained and was unremarkable.  Cardiology felt her chest pain was less likely cardiac in origin.  She was seen by Dr. Marin Olp, hematology/oncology who recommended increasing her Xarelto to 20 mg daily with close outpatient follow-up and possible repeat lower extremity ultrasound in 3 months at the earliest.   He also ordered a CT abdomen pelvis given the patient's extensive family history of cancer which fortunately was unrevealing.  A & P   Principal Problem:   Acute DVT (deep venous thrombosis) (HCC) Active Problems:   DVT (deep venous thrombosis) (HCC)   Fibromyalgia   RLS (restless legs syndrome)   Depression with anxiety   Aortic atherosclerosis (HCC)   Acute left lower extremity DVT with past medical history of recurrent DVT -Occurred despite Xarelto 10 mg daily -Increase Xarelto to 20 mg per hematology recommendations with close outpatient follow-up  Atypical Chest pain, likely GI in origin -Evaluated by cardiology and felt to be noncardiac -Echo unremarkable with unremarkable troponins -Has a history of acid reflux that she takes Tums for -Advised to try over-the-counter PPI versus H2 blockers and avoid Tums as this can cause rebound GERD  Aortic atherosclerosis -outpatient lipid panel  Anxiety/depression -Continue home Lexapro and as needed Xanax  Restless leg syndrome -Continue Requip    Consultants  . Oncology . Cardiology  Procedures  . None  Antibiotics   Anti-infectives (From admission, onward)   None       Subjective  Patient seen and examined at bedside no acute distress and resting comfortably.  No events overnight.  Tolerating diet. In good spirits and anticipating discharge.   Denies any chest pain, shortness of breath, fever, nausea, vomiting, urinary or bowel complaints. Otherwise ROS negative    Objective   Discharge Exam: Vitals:   11/17/20 0537 11/17/20 1251  BP: 123/66 (!) 132/56  Pulse: (!) 52 (!) 49  Resp:    Temp: 98.2 F (36.8 C) 98 F (  36.7 C)  SpO2: 99% 100%   Vitals:   11/16/20 1339 11/16/20 2239 11/17/20 0537 11/17/20 1251  BP: 135/68 (!) 130/53 123/66 (!) 132/56  Pulse: (!) 54 (!) 41 (!) 52 (!) 49  Resp: 14 18    Temp: 98.1 F (36.7 C) 98.3 F (36.8 C) 98.2 F (36.8 C) 98 F (36.7 C)  TempSrc: Oral Oral  Oral   SpO2: 97% 96% 99% 100%  Weight:      Height:        Physical Exam Vitals and nursing note reviewed.  Constitutional:      Appearance: Normal appearance.  HENT:     Head: Normocephalic and atraumatic.  Eyes:     Conjunctiva/sclera: Conjunctivae normal.  Cardiovascular:     Rate and Rhythm: Normal rate and regular rhythm.  Pulmonary:     Effort: Pulmonary effort is normal.     Breath sounds: Normal breath sounds.  Abdominal:     General: Abdomen is flat.     Palpations: Abdomen is soft.  Musculoskeletal:        General: No swelling or tenderness.  Skin:    Coloration: Skin is not jaundiced or pale.  Neurological:     Mental Status: She is alert. Mental status is at baseline.  Psychiatric:        Mood and Affect: Mood normal.        Behavior: Behavior normal.       The results of significant diagnostics from this hospitalization (including imaging, microbiology, ancillary and laboratory) are listed below for reference.     Microbiology: Recent Results (from the past 240 hour(s))  Resp Panel by RT-PCR (Flu A&B, Covid) Nasopharyngeal Swab     Status: None   Collection Time: 11/13/20  7:08 PM   Specimen: Nasopharyngeal Swab; Nasopharyngeal(NP) swabs in vial transport medium  Result Value Ref Range Status   SARS Coronavirus 2 by RT PCR NEGATIVE NEGATIVE Final    Comment: (NOTE) SARS-CoV-2 target nucleic acids are NOT DETECTED.  The SARS-CoV-2 RNA is generally detectable in upper respiratory specimens during the acute phase of infection. The lowest concentration of SARS-CoV-2 viral copies this assay can detect is 138 copies/mL. A negative result does not preclude SARS-Cov-2 infection and should not be used as the sole basis for treatment or other patient management decisions. A negative result may occur with  improper specimen collection/handling, submission of specimen other than nasopharyngeal swab, presence of viral mutation(s) within the areas targeted by this  assay, and inadequate number of viral copies(<138 copies/mL). A negative result must be combined with clinical observations, patient history, and epidemiological information. The expected result is Negative.  Fact Sheet for Patients:  EntrepreneurPulse.com.au  Fact Sheet for Healthcare Providers:  IncredibleEmployment.be  This test is no t yet approved or cleared by the [place] FDA and  has been authorized for detection and/or diagnosis of SARS-CoV-2 by FDA under an Emergency Use Authorization (EUA). This EUA will remain  in effect (meaning this test can be used) for the duration of the COVID-19 declaration under Section 564(b)(1) of the Act, 21 U.S.C.section 360bbb-3(b)(1), unless the authorization is terminated  or revoked sooner.       Influenza A by PCR NEGATIVE NEGATIVE Final   Influenza B by PCR NEGATIVE NEGATIVE Final    Comment: (NOTE) The Xpert Xpress SARS-CoV-2/FLU/RSV plus assay is intended as an aid in the diagnosis of influenza from Nasopharyngeal swab specimens and should not be used as a sole basis for treatment. Nasal  washings and aspirates are unacceptable for Xpert Xpress SARS-CoV-2/FLU/RSV testing.  Fact Sheet for Patients: EntrepreneurPulse.com.au  Fact Sheet for Healthcare Providers: IncredibleEmployment.be  This test is not yet approved or cleared by the [place] FDA and has been authorized for detection and/or diagnosis of SARS-CoV-2 by FDA under an Emergency Use Authorization (EUA). This EUA will remain in effect (meaning this test can be used) for the duration of the COVID-19 declaration under Section 564(b)(1) of the Act, 21 U.S.C. section 360bbb-3(b)(1), unless the authorization is terminated or revoked.  Performed at [facility], [place] [address] [place], [place] [address]      Labs: BNP (last 3 results) No results for input(s): BNP in the  last 8760 hours. Basic Metabolic Panel: Recent Labs  Lab 11/13/20 1653 11/14/20 0653  NA 142 140  K 3.7 4.0  CL 107 108  CO2 28 25  GLUCOSE 106* 103*  BUN 19 13  CREATININE 0.81 0.65  CALCIUM 9.2 8.3*   Liver Function Tests: No results for input(s): AST, ALT, ALKPHOS, BILITOT, PROT, ALBUMIN in the last 168 hours. No results for input(s): LIPASE, AMYLASE in the last 168 hours. No results for input(s): AMMONIA in the last 168 hours. CBC: Recent Labs  Lab 11/13/20 1653 11/14/20 0653 11/15/20 0442 11/16/20 0207 11/17/20 0611  WBC 9.2 8.3 8.7 7.4 7.4  HGB 14.9 13.3 13.2 13.6 13.8  HCT 45.6 40.7 41.1 41.4 41.6  MCV 96.2 96.9 98.6 95.6 96.3  PLT 225 199 207 225 247   Cardiac Enzymes: No results for input(s): CKTOTAL, CKMB, CKMBINDEX, TROPONINI in the last 168 hours. BNP: Invalid input(s): POCBNP CBG: No results for input(s): GLUCAP in the last 168 hours. D-Dimer No results for input(s): DDIMER in the last 72 hours. Hgb A1c No results for input(s): HGBA1C in the last 72 hours. Lipid Profile No results for input(s): CHOL, HDL, LDLCALC, TRIG, CHOLHDL, LDLDIRECT in the last 72 hours. Thyroid function studies Recent Labs    11/17/20 1103  TSH 0.411   Anemia work up No results for input(s): VITAMINB12, FOLATE, FERRITIN, TIBC, IRON, RETICCTPCT in the last 72 hours. Urinalysis    Component Value Date/Time   COLORURINE YELLOW 11/05/2020 1459   APPEARANCEUR CLOUDY (A) 11/05/2020 1459   LABSPEC 1.020 11/05/2020 1459   PHURINE 5.0 11/05/2020 1459   GLUCOSEU NEGATIVE 11/05/2020 1459   HGBUR 1+ (A) 11/05/2020 1459   BILIRUBINUR NEGATIVE 11/14/2017 1422   KETONESUR NEGATIVE 11/05/2020 1459   PROTEINUR NEGATIVE 11/05/2020 1459   UROBILINOGEN 0.2 02/27/2014 1239   NITRITE NEGATIVE 11/14/2017 1422   LEUKOCYTESUR NEGATIVE 11/14/2017 1422   Sepsis Labs Invalid input(s): PROCALCITONIN,  WBC,  LACTICIDVEN Microbiology Recent Results (from the past 240 hour(s))  Resp Panel  by RT-PCR (Flu A&B, Covid) Nasopharyngeal Swab     Status: None   Collection Time: 11/13/20  7:08 PM   Specimen: Nasopharyngeal Swab; Nasopharyngeal(NP) swabs in vial transport medium  Result Value Ref Range Status   SARS Coronavirus 2 by RT PCR NEGATIVE NEGATIVE Final    Comment: (NOTE) SARS-CoV-2 target nucleic acids are NOT DETECTED.  The SARS-CoV-2 RNA is generally detectable in upper respiratory specimens during the acute phase of infection. The lowest concentration of SARS-CoV-2 viral copies this assay can detect is 138 copies/mL. A negative result does not preclude SARS-Cov-2 infection and should not be used as the sole basis for treatment or other patient management decisions. A negative result may occur with  improper specimen collection/handling, submission of specimen other than  nasopharyngeal swab, presence of viral mutation(s) within the areas targeted by this assay, and inadequate number of viral copies(<138 copies/mL). A negative result must be combined with clinical observations, patient history, and epidemiological information. The expected result is Negative.  Fact Sheet for Patients:  EntrepreneurPulse.com.au  Fact Sheet for Healthcare Providers:  IncredibleEmployment.be  This test is no t yet approved or cleared by the [place] FDA and  has been authorized for detection and/or diagnosis of SARS-CoV-2 by FDA under an Emergency Use Authorization (EUA). This EUA will remain  in effect (meaning this test can be used) for the duration of the COVID-19 declaration under Section 564(b)(1) of the Act, 21 U.S.C.section 360bbb-3(b)(1), unless the authorization is terminated  or revoked sooner.       Influenza A by PCR NEGATIVE NEGATIVE Final   Influenza B by PCR NEGATIVE NEGATIVE Final    Comment: (NOTE) The Xpert Xpress SARS-CoV-2/FLU/RSV plus assay is intended as an aid in the diagnosis of influenza from Nasopharyngeal swab  specimens and should not be used as a sole basis for treatment. Nasal washings and aspirates are unacceptable for Xpert Xpress SARS-CoV-2/FLU/RSV testing.  Fact Sheet for Patients: EntrepreneurPulse.com.au  Fact Sheet for Healthcare Providers: IncredibleEmployment.be  This test is not yet approved or cleared by the [place] FDA and has been authorized for detection and/or diagnosis of SARS-CoV-2 by FDA under an Emergency Use Authorization (EUA). This EUA will remain in effect (meaning this test can be used) for the duration of the COVID-19 declaration under Section 564(b)(1) of the Act, 21 U.S.C. section 360bbb-3(b)(1), unless the authorization is terminated or revoked.  Performed at [facility], [place] [address], [place], [place] [address]     Discharge Instructions     Discharge Instructions    Diet - low sodium heart healthy   Complete by: As directed    Discharge instructions   Complete by: As directed    You were seen in the hospital for a left leg deep vein thrombosis.  You were seen by the hematologist and you were also seen by the cardiologist for chest pain which was not thought to be cardiac in origin and more likely GI.  Upon discharge: -Increase Xarelto from 10 mg to 20 mg daily.  This is a blood thinner.  Check your stool regularly for any bleeding or changing consistency and be very careful when standing and walking so as not to trip/fall -Follow-up with Dr. Marin Olp, hematology/oncology, in the office -You can take over-the-counter Pepcid, omeprazole or Maalox for acid reflux (not at the same time).  Try to stay away from Tums if possible as this can cause rebound acid reflux and elevated calcium levels  If you have any significant change or worsening of your symptoms do not hesitate to contact your primary care physician or return to the ED   Increase activity slowly   Complete by: As directed       Allergies as of 11/17/2020      Reactions   Sulfa Antibiotics Rash      Medication List    TAKE these medications   acetaminophen 325 MG tablet Commonly known as: TYLENOL Take 650 mg by mouth every 6 (six) hours as needed for moderate pain.   ALPRAZolam 0.25 MG tablet Commonly known as: XANAX Take 1 tablet (0.25 mg total) by mouth every 8 (eight) hours as needed for anxiety. What changed:   how much to take  when to take this   B COMPLEX  1 PO Take 1 each by mouth 2 (two) times daily. sublingual   Calcium Carbonate+Vitamin D 600-200 MG-UNIT Tabs Take 1 tablet by mouth daily.   denosumab 60 MG/ML Soln injection Commonly known as: PROLIA Inject 60 mg into the skin every 6 (six) months. Administer in upper arm, thigh, or abdomen   dicyclomine 20 MG tablet Commonly known as: BENTYL Take 20 mg by mouth 3 (three) times daily as needed for spasms.   escitalopram 20 MG tablet Commonly known as: LEXAPRO Take 20 mg by mouth daily.   estradiol 0.1 MG/GM vaginal cream Commonly known as: ESTRACE Apply small amount topically to affected are x 2-3 weeks What changed:   how much to take  how to take this  when to take this  additional instructions   MULTIVITAMIN PO Take 1 tablet by mouth 2 (two) times daily.   rivaroxaban 20 MG Tabs tablet Commonly known as: XARELTO Take 1 tablet (20 mg total) by mouth daily. Start taking on: November 18, 2020 What changed:   medication strength  how much to take   rOPINIRole 1 MG tablet Commonly known as: REQUIP Take 1 tablet (1 mg total) by mouth at bedtime.       Allergies  Allergen Reactions  . Sulfa Antibiotics Rash     Dispo: The patient is from: Home              Anticipated d/c is to: Home              Patient currently is medically stable to d/c.       Time coordinating discharge: Over 30 minutes   SIGNED:   Harold Hedge, D.O. [facility] Pager: [phone]  11/17/2020, 3:37 PM

## 2020-11-17 NOTE — Care Management Important Message (Signed)
Important Message  Patient Details IM Letter given to the Patient. Name: Emma Lopez MRN: [id number] Date of Birth: 1946/03/13   Medicare Important Message Given:  Yes     Kerin Salen 11/17/2020, 10:03 AM

## 2020-11-17 NOTE — Consult Note (Signed)
Referral MD  Reason for Referral: Recurrent thromboembolic disease of the left leg.  Chief Complaint  Patient presents with  . Leg Swelling  : I have another blood clot my leg.  HPI: [name] is a very charming 75 year old white female.  She looks a lot younger.  She is originally from [place].  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  She is able to retire in 2002.  She has a history of blood clots.  There is a history of blood clots in the family.  She has been on anticoagulation chronically.  She was last seen by a hematologist, Dr. Ralene Ok, probably about 14 years ago.  She had been on Coumadin.  About a year ago she was switched over to Xarelto.  She has not traveled anywhere recently.  She has had no history of tobacco use or alcohol use.  She has had no change in bowel or bladder habits.  There is a strong history of cancer in her family.  There is a history of breast cancer, ovarian cancer, colon cancer.  She is up-to-date with all of her preventive studies.  She recently had pain in her left calf.  There is little bit of swelling.  She subsequently went to the emergency room.  This was on 11/13/2020.  She had a Doppler done.  Surprisingly, this showed acute thrombus in the left femoral vein, left popliteal vein, and left gastrocnemius vein.  She does have chronic thrombus in these veins.  She had a CT angiogram that was done.  I think this was unremarkable.  She was placed on heparin.  Of note, she is only been on Xarelto 10 mg.  She has had no bleeding.  She says her left leg feels a little bit better.  Currently, her performance status is ECOG 1.   Past Medical History:  Diagnosis Date  . Abnormal EKG   . Anxiety   . Arthritis    oa  . Bradycardia 12/14/2017  . CIN III (cervical intraepithelial neoplasia III) 2000  . Complication of anesthesia    did well last 2 times with procedures  . Depression   . 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 DVT (deep venous  thrombosis) ([place]) 01/2009   LEFT LEG  . Dyspnea on exertion 10/2017  . Elevated triglycerides with high cholesterol   . Endometriosis   . Fibromyalgia   . GERD (gastroesophageal reflux disease)   . History of colon polyps   . History of hiatal hernia    told by some md has, some say not  . IBS (irritable bowel syndrome) 2015  . Multinodular goiter   . Osteoporosis 12/2017   T score -3.2 stable from prior study  . PE (pulmonary embolism)   . PONV (postoperative nausea and vomiting)   . Restless legs syndrome   . Sleep disorder   . Thyroid disease    HYPERTHYROIDISM  . Vitamin D deficiency   :  Past Surgical History:  Procedure Laterality Date  . ABDOMINAL HYSTERECTOMY  2001   TAH, partial  . APPENDECTOMY  1978  . COLONOSCOPY WITH PROPOFOL N/A 12/07/2015   Procedure: COLONOSCOPY WITH PROPOFOL;  Surgeon: Garlan Fair, MD;  Location: [facility];  Service: Endoscopy;  Laterality: N/A;  . colonscopy  7 yrs ago   other in past  . ESOPHAGOGASTRODUODENOSCOPY (EGD) WITH PROPOFOL N/A 12/07/2015   Procedure: ESOPHAGOGASTRODUODENOSCOPY (EGD) WITH PROPOFOL;  Surgeon: Garlan Fair, MD;  Location: [facility];  Service: Endoscopy;  Laterality: N/A;  . ESOPHAGOGASTRODUODENOSCOPY ENDOSCOPY  yrs ago  . FACIAL COSMETIC SURGERY    . GYNECOLOGIC CRYOSURGERY    . MYOMECTOMY    . TONSILLECTOMY    :   Current Facility-Administered Medications:  .  acetaminophen (TYLENOL) tablet 650 mg, 650 mg, Oral, Q6H PRN **OR** acetaminophen (TYLENOL) suppository 650 mg, 650 mg, Rectal, Q6H PRN, Opyd, Timothy S, MD .  ALPRAZolam Duanne Moron) tablet 0.125-0.25 mg, 0.125-0.25 mg, Oral, Daily PRN, Opyd, Ilene Qua, MD .  alum & mag hydroxide-simeth (MAALOX/MYLANTA) 200-200-20 MG/5ML suspension 30 mL, 30 mL, Oral, Q6H PRN, Dana Allan I, MD, 30 mL at 11/15/20 2007 .  dicyclomine (BENTYL) tablet 20 mg, 20 mg, Oral, TID PRN, Opyd, Timothy S, MD .  escitalopram (LEXAPRO) tablet 20 mg, 20 mg, Oral, Daily,  Opyd, Ilene Qua, MD, 20 mg at 11/17/20 0818 .  HYDROcodone-acetaminophen (NORCO/VICODIN) 5-325 MG per tablet 1-2 tablet, 1-2 tablet, Oral, Q4H PRN, Opyd, Timothy S, MD .  iohexol (OMNIPAQUE) 9 MG/ML oral solution, , , ,  .  nitroGLYCERIN (NITROSTAT) SL tablet 0.4 mg, 0.4 mg, Sublingual, Q5 min PRN, Dana Allan I, MD, 0.4 mg at 11/14/20 1531 .  ondansetron (ZOFRAN) tablet 4 mg, 4 mg, Oral, Q6H PRN **OR** ondansetron (ZOFRAN) injection 4 mg, 4 mg, Intravenous, Q6H PRN, Opyd, Timothy S, MD .  polyethylene glycol (MIRALAX / GLYCOLAX) packet 17 g, 17 g, Oral, Daily PRN, Opyd, Ilene Qua, MD .  rivaroxaban (XARELTO) tablet 20 mg, 20 mg, Oral, Daily, Volanda Napoleon, MD, 20 mg at 11/17/20 0953 .  rOPINIRole (REQUIP) tablet 1 mg, 1 mg, Oral, QHS, Opyd, Ilene Qua, MD, 1 mg at 11/16/20 2112:  . escitalopram  20 mg Oral Daily  . iohexol      . rivaroxaban  20 mg Oral Daily  . rOPINIRole  1 mg Oral QHS  :  Allergies  Allergen Reactions  . Sulfa Antibiotics Rash  :  Family History  Problem Relation Age of Onset  . Hypertension Mother   . Breast cancer Mother        24's  . Cancer Father        COLON  . Hypertension Sister   . Stroke Sister   . Breast cancer Maternal Aunt        Age 42's  . Cancer Maternal Aunt        Melanoma  . Alzheimer's disease Maternal Aunt   . Cancer Paternal Aunt        OVARIAN and COLON  . Breast cancer Maternal Aunt        70's  . Cancer Maternal Aunt        Colon CA  . Alzheimer's disease Maternal Aunt   :  Social History   Socioeconomic History  . Marital status: Single    Spouse name: Not on file  . Number of children: Not on file  . Years of education: Not on file  . Highest education level: Not on file  Occupational History  . Not on file  Tobacco Use  . Smoking status: Former Smoker    Packs/day: 0.10    Years: 6.00    Pack years: 0.60    Types: Cigarettes    Quit date: 08/01/1978    Years since quitting: 42.3  . Smokeless tobacco:  Never Used  . Tobacco comment: only smoked 4-6 yrs 1 pack per month  Vaping Use  . Vaping Use: Never used  Substance and Sexual Activity  .  Alcohol use: Not Currently    Alcohol/week: 0.0 standard drinks  . Drug use: No  . Sexual activity: Not Currently    Birth control/protection: Surgical    Comment: HYSTERECTOMY-1st intercourse 27-Fewer than 5 partners  Other Topics Concern  . Not on file  Social History Narrative  . Not on file   Social Determinants of Health   Financial Resource Strain: Not on file  Food Insecurity: Not on file  Transportation Needs: Not on file  Physical Activity: Not on file  Stress: Not on file  Social Connections: Not on file  Intimate Partner Violence: Not on file  :  Review of Systems  Constitutional: Negative.   HENT: Negative.   Eyes: Negative.   Respiratory: Negative.   Cardiovascular: Negative.   Gastrointestinal: Negative.   Genitourinary: Negative.   Musculoskeletal: Positive for myalgias.  Skin: Negative.   Neurological: Negative.   Endo/Heme/Allergies: Negative.   Psychiatric/Behavioral: Negative.       Exam:  Physical Exam Vitals reviewed.  HENT:     Head: Normocephalic and atraumatic.  Eyes:     Pupils: Pupils are equal, round, and reactive to light.  Cardiovascular:     Rate and Rhythm: Normal rate and regular rhythm.     Heart sounds: Normal heart sounds.  Pulmonary:     Effort: Pulmonary effort is normal.     Breath sounds: Normal breath sounds.  Abdominal:     General: Bowel sounds are normal.     Palpations: Abdomen is soft.  Musculoskeletal:        General: No tenderness or deformity. Normal range of motion.     Cervical back: Normal range of motion.     Comments: Extremities show some slight nonpitting edema of the left leg.  She has good pulses in her distal extremities.  I cannot palpate a venous cord in her left leg.  There may be a little bit of fullness behind the left knee.  Lymphadenopathy:      Cervical: No cervical adenopathy.  Skin:    General: Skin is warm and dry.     Findings: No erythema or rash.  Neurological:     Mental Status: She is alert and oriented to person, place, and time.  Psychiatric:        Behavior: Behavior normal.        Thought Content: Thought content normal.        Judgment: Judgment normal.     Patient Vitals for the past 24 hrs:  BP Temp Temp src Pulse Resp SpO2  11/17/20 1251 (!) 132/56 98 F (36.7 C) Oral (!) 49 -- 100 %  11/17/20 0537 123/66 98.2 F (36.8 C) -- (!) 52 -- 99 %  11/16/20 2239 (!) 130/53 98.3 F (36.8 C) Oral (!) 41 18 96 %  11/16/20 1339 135/68 98.1 F (36.7 C) Oral (!) 54 14 97 %     Recent Labs    11/16/20 0207 11/17/20 0611  WBC 7.4 7.4  HGB 13.6 13.8  HCT 41.4 41.6  PLT 225 247   No results for input(s): NA, K, CL, CO2, GLUCOSE, BUN, CREATININE, CALCIUM in the last 72 hours.  Blood smear review: None  Pathology: None    Assessment and Plan: [name] is a very charming 75 year old white female.  She has a strong family history of thromboembolic disease.  She has had past thrombus herself.  She says she was tested for this.  It sounded like she was tested about 20  years ago.  We clearly are going to have to do some more thrombophilic studies on her.  I would do these as an outpatient.  I do think that we should try her on full dose Xarelto.  The other option would be Coumadin.  This would be quite a inconvenience for her.  [place] I would hate to have to be tested for Coumadin levels.  I think that if we can do full dose Xarelto, I think this would be helpful and manageable.  I would do a CT scan of her abdomen and pelvis.  I would like to make sure that she does not have any underlying malignancy in that area.  Again there is a strong family history of malignancy.  We will probably have her come back to the office to be seen in a couple weeks.   When she comes out to the office, we can then do a thrombophilia panel on her.  I would wait probably about 3 months before I would do another Doppler of her left leg so we can see how this thrombus has responded.  She is incredibly delightful.  I really enjoyed talking to her.  She really was a lot of fun to talk with.

## 2020-11-19 ENCOUNTER — Telehealth: Payer: Self-pay | Admitting: *Deleted

## 2020-11-19 NOTE — Telephone Encounter (Signed)
Per staff message 11/18/20 Roselyn Reef - gave patient upcoming appointments

## 2020-11-22 ENCOUNTER — Other Ambulatory Visit: Payer: Self-pay | Admitting: Neurology

## 2020-11-23 ENCOUNTER — Other Ambulatory Visit: Payer: Self-pay | Admitting: Neurology

## 2020-11-23 ENCOUNTER — Encounter: Payer: Self-pay | Admitting: Neurology

## 2020-11-24 ENCOUNTER — Encounter: Payer: Self-pay | Admitting: Obstetrics and Gynecology

## 2020-11-24 ENCOUNTER — Other Ambulatory Visit: Payer: Self-pay

## 2020-11-24 ENCOUNTER — Ambulatory Visit: Payer: PPO | Admitting: Obstetrics and Gynecology

## 2020-11-24 ENCOUNTER — Telehealth: Payer: Self-pay | Admitting: Obstetrics and Gynecology

## 2020-11-24 VITALS — BP 118/64 | HR 55 | Wt 136.0 lb

## 2020-11-24 DIAGNOSIS — N9089 Other specified noninflammatory disorders of vulva and perineum: Secondary | ICD-10-CM

## 2020-11-24 NOTE — Progress Notes (Signed)
GYNECOLOGY  VISIT   HPI: 75 y.o.   Single  Caucasian  female   G0P0 with No LMP recorded. Patient has had a hysterectomy.   here for 3 week follow up and possible vulvar biopsy.  Has noticed yellow staining in her underwear for a while.  Has vaginal dryness.  Saw Dr. Delilah Shan last year and had a normal pap.  She has a history of DES.   Seen by Karma Ganja on 4/722 and was noted to have fragile tissue of the infraclitoral region.  Her visit was prompted by bleeding with wiping with toilet tissue. She was taking Xarelto 10 mg at the time.  Hx of DVTs. Recently in hospital for her 3rd DVT, second time in her left leg. She is on Xarelto and is now on a doubled dosage of 20 mg. She will see Dr. Marin Olp tomorrow for further evaluation.   She has been feeling very fatigued.   GYNECOLOGIC HISTORY: No LMP recorded. Patient has had a hysterectomy. Contraception:  Hyst--Hx dysplasia of cx--ovaries remain(Hx DES exposure) Menopausal hormone therapy:  none Last mammogram: 01-15-20 3D/Neg/BiRads1 Last pap smear: 08-19-20 Neg, 07-03-19 Neg, 06-07-18 Neg        OB History    Gravida  0   Para  0   Term      Preterm      AB      Living        SAB      IAB      Ectopic      Multiple      Live Births                 Patient Active Problem List   Diagnosis Date Noted  . Aortic atherosclerosis ([place]) 11/17/2020  . Acute DVT (deep venous thrombosis) ([place]) 11/13/2020  . Depression with anxiety 11/13/2020  . RLS (restless legs syndrome) 01/07/2020  . Insomnia due to medical condition 01/07/2020  . Nocturia more than twice per night 01/07/2020  . Bradycardia 12/14/2017  . Abnormal EKG   . GERD (gastroesophageal reflux disease)   . Dyspnea on exertion 10/30/2017  . Family history of ovarian cancer 04/17/2017  . Family history of breast cancer 04/17/2017  . Family history of colon cancer 04/17/2017  . Family history of pancreatic cancer 04/17/2017  . Family history of  melanoma 04/17/2017  . Genetic testing 04/17/2017  . CIN III (cervical intraepithelial neoplasia III)   . Arthritis   . Fibromyalgia   . PE (pulmonary embolism)   . Thyroid disease   . Osteoporosis   . DVT (deep venous thrombosis) ([place]) 04/01/2004    Past Medical History:  Diagnosis Date  . Abnormal EKG   . Anxiety   . Arthritis    oa  . Bradycardia 12/14/2017  . CIN III (cervical intraepithelial neoplasia III) 2000  . Complication of anesthesia    did well last 2 times with procedures  . Depression   . 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 DVT (deep venous thrombosis) ([place]) 01/2009   LEFT LEG  . Dyspnea on exertion 10/2017  . Elevated triglycerides with high cholesterol   . Endometriosis   . Fibromyalgia   . GERD (gastroesophageal reflux disease)   . History of colon polyps   . History of hiatal hernia    told by some md has, some say not  . IBS (irritable bowel syndrome) 2015  . Multinodular goiter   . Osteoporosis 12/2017   T score -3.2  stable from prior study  . PE (pulmonary embolism)   . PONV (postoperative nausea and vomiting)   . Restless legs syndrome   . Sleep disorder   . Thyroid disease    HYPERTHYROIDISM  . Vitamin D deficiency     Past Surgical History:  Procedure Laterality Date  . ABDOMINAL HYSTERECTOMY  2001   TAH, partial  . APPENDECTOMY  1978  . COLONOSCOPY WITH PROPOFOL N/A 12/07/2015   Procedure: COLONOSCOPY WITH PROPOFOL;  Surgeon: Garlan Fair, MD;  Location: [facility];  Service: Endoscopy;  Laterality: N/A;  . colonscopy  7 yrs ago   other in past  . ESOPHAGOGASTRODUODENOSCOPY (EGD) WITH PROPOFOL N/A 12/07/2015   Procedure: ESOPHAGOGASTRODUODENOSCOPY (EGD) WITH PROPOFOL;  Surgeon: Garlan Fair, MD;  Location: [facility];  Service: Endoscopy;  Laterality: N/A;  . ESOPHAGOGASTRODUODENOSCOPY ENDOSCOPY  yrs ago  . FACIAL COSMETIC SURGERY    . GYNECOLOGIC CRYOSURGERY    . MYOMECTOMY    . TONSILLECTOMY      Current Outpatient Medications   Medication Sig Dispense Refill  . acetaminophen (TYLENOL) 325 MG tablet Take 650 mg by mouth every 6 (six) hours as needed for moderate pain.    Marland Kitchen ALPRAZolam (XANAX) 0.25 MG tablet Take 1 tablet (0.25 mg total) by mouth every 8 (eight) hours as needed for anxiety. (Patient taking differently: Take 0.125-0.25 mg by mouth daily as needed for anxiety.) 30 tablet 0  . B Complex Vitamins (B COMPLEX 1 PO) Take 1 each by mouth 2 (two) times daily. sublingual    . Calcium Carbonate+Vitamin D 600-200 MG-UNIT TABS Take 1 tablet by mouth daily.    Marland Kitchen denosumab (PROLIA) 60 MG/ML SOLN injection Inject 60 mg into the skin every 6 (six) months. Administer in upper arm, thigh, or abdomen 180 mL 2  . dicyclomine (BENTYL) 20 MG tablet Take 20 mg by mouth 3 (three) times daily as needed for spasms.    Marland Kitchen escitalopram (LEXAPRO) 20 MG tablet Take 20 mg by mouth daily.    Marland Kitchen estradiol (ESTRACE) 0.1 MG/GM vaginal cream Apply small amount topically to affected are x 2-3 weeks (Patient taking differently: Place 1 Applicatorful vaginally See admin instructions. Apply small amount topically to affected area 2-3 times a day x 2-3 weeks) 42.5 g 0  . Multiple Vitamins-Minerals (MULTIVITAMIN PO) Take 1 tablet by mouth 2 (two) times daily.     . rivaroxaban (XARELTO) 20 MG TABS tablet Take 1 tablet (20 mg total) by mouth daily. 90 tablet 0  . rOPINIRole (REQUIP) 1 MG tablet Take 1 tablet (1 mg total) by mouth at bedtime. 60 tablet 2   No current facility-administered medications for this visit.     ALLERGIES: Sulfa antibiotics  Family History  Problem Relation Age of Onset  . Hypertension Mother   . Breast cancer Mother        73's  . Cancer Father        COLON  . Hypertension Sister   . Stroke Sister   . Breast cancer Maternal Aunt        Age 61's  . Cancer Maternal Aunt        Melanoma  . Alzheimer's disease Maternal Aunt   . Cancer Paternal Aunt        OVARIAN and COLON  . Breast cancer Maternal Aunt         70's  . Cancer Maternal Aunt        Colon CA  . Alzheimer's disease Maternal Aunt  Social History   Socioeconomic History  . Marital status: Single    Spouse name: Not on file  . Number of children: Not on file  . Years of education: Not on file  . Highest education level: Not on file  Occupational History  . Not on file  Tobacco Use  . Smoking status: Former Smoker    Packs/day: 0.10    Years: 6.00    Pack years: 0.60    Types: Cigarettes    Quit date: 08/01/1978    Years since quitting: 42.3  . Smokeless tobacco: Never Used  . Tobacco comment: only smoked 4-6 yrs 1 pack per month  Vaping Use  . Vaping Use: Never used  Substance and Sexual Activity  . Alcohol use: Not Currently    Alcohol/week: 0.0 standard drinks  . Drug use: No  . Sexual activity: Not Currently    Birth control/protection: Surgical    Comment: HYSTERECTOMY-1st intercourse 27-Fewer than 5 partners  Other Topics Concern  . Not on file  Social History Narrative  . Not on file   Social Determinants of Health   Financial Resource Strain: Not on file  Food Insecurity: Not on file  Transportation Needs: Not on file  Physical Activity: Not on file  Stress: Not on file  Social Connections: Not on file  Intimate Partner Violence: Not on file    Review of Systems  All other systems reviewed and are negative.   PHYSICAL EXAMINATION:    BP 118/64   Pulse (!) 55   Wt 136 lb (61.7 kg)   SpO2 98%   BMI 24.87 kg/m     General appearance: alert, cooperative and appears stated age  Pelvic: External genitalia:  Mild erythema of tissue below the clitoris.  No lesions. No ulceration.               Urethra:  normal appearing urethra with no masses, tenderness or lesions              Bartholins and Skenes: normal                 Vagina: normal appearing vagina with normal color and discharge, no lesions              Cervix: no lesions                Bimanual Exam:  Uterus:  absent               Adnexa: no mass, fullness, tenderness              Chaperone was present for exam.  ASSESSMENT  Status post TAH.  Ovaries remain.  Recurrent DVT.  On Xarelto.  FH breast cancer.  Vulvar bleeding, resolved.  PLAN  No biopsy needed today.  I recommend she use just Vaseline on her vulvar tissue to provide moisture to the tissue. Stop vaginal estrogen cream. She will call if the bleeding resumes. FU with Dr. Marin Olp for DVT care.    30 min  total time was spent for this patient encounter, including preparation, face-to-face counseling with the patient, coordination of care, and documentation of the encounter.

## 2020-11-24 NOTE — Telephone Encounter (Signed)
Please contact patient in follow up to her visit today.   I would like her to discontinue her use of the vaginal estrogen cream. I have removed this from her medication list.   I do recommend using Vaseline if she needs to hydrate the vulvar tissue.

## 2020-11-25 ENCOUNTER — Inpatient Hospital Stay: Payer: PPO | Attending: Hematology & Oncology

## 2020-11-25 ENCOUNTER — Encounter: Payer: Self-pay | Admitting: Hematology & Oncology

## 2020-11-25 ENCOUNTER — Inpatient Hospital Stay (HOSPITAL_BASED_OUTPATIENT_CLINIC_OR_DEPARTMENT_OTHER): Payer: PPO | Admitting: Hematology & Oncology

## 2020-11-25 VITALS — BP 148/51 | HR 50 | Temp 97.9°F | Resp 18 | Wt 136.0 lb

## 2020-11-25 DIAGNOSIS — I82462 Acute embolism and thrombosis of left calf muscular vein: Secondary | ICD-10-CM | POA: Diagnosis not present

## 2020-11-25 DIAGNOSIS — I82432 Acute embolism and thrombosis of left popliteal vein: Secondary | ICD-10-CM | POA: Diagnosis not present

## 2020-11-25 DIAGNOSIS — Z7901 Long term (current) use of anticoagulants: Secondary | ICD-10-CM | POA: Insufficient documentation

## 2020-11-25 DIAGNOSIS — I82412 Acute embolism and thrombosis of left femoral vein: Secondary | ICD-10-CM | POA: Insufficient documentation

## 2020-11-25 DIAGNOSIS — I82403 Acute embolism and thrombosis of unspecified deep veins of lower extremity, bilateral: Secondary | ICD-10-CM

## 2020-11-25 LAB — LACTATE DEHYDROGENASE: LDH: 214 U/L — ABNORMAL HIGH (ref 98–192)

## 2020-11-25 LAB — CMP (CANCER CENTER ONLY)
ALT: 16 U/L (ref 0–44)
AST: 17 U/L (ref 15–41)
Albumin: 3.7 g/dL (ref 3.5–5.0)
Alkaline Phosphatase: 81 U/L (ref 38–126)
Anion gap: 6 (ref 5–15)
BUN: 21 mg/dL (ref 8–23)
CO2: 29 mmol/L (ref 22–32)
Calcium: 8.8 mg/dL — ABNORMAL LOW (ref 8.9–10.3)
Chloride: 105 mmol/L (ref 98–111)
Creatinine: 0.75 mg/dL (ref 0.44–1.00)
GFR, Estimated: >60 mL/min (ref >60)
Glucose, Bld: 91 mg/dL (ref 70–99)
Potassium: 3.9 mmol/L (ref 3.5–5.1)
Sodium: 140 mmol/L (ref 135–145)
Total Bilirubin: 0.3 mg/dL (ref 0.3–1.2)
Total Protein: 6.4 g/dL — ABNORMAL LOW (ref 6.5–8.1)

## 2020-11-25 LAB — CBC WITH DIFFERENTIAL (CANCER CENTER ONLY)
Abs Immature Granulocytes: 0.07 10*3/uL (ref 0.00–0.07)
Basophils Absolute: 0.1 10*3/uL (ref 0.0–0.1)
Basophils Relative: 1 %
Eosinophils Absolute: 0.1 10*3/uL (ref 0.0–0.5)
Eosinophils Relative: 2 %
HCT: 41.9 % (ref 36.0–46.0)
Hemoglobin: 13.6 g/dL (ref 12.0–15.0)
Immature Granulocytes: 1 %
Lymphocytes Relative: 21 %
Lymphs Abs: 1.6 10*3/uL (ref 0.7–4.0)
MCH: 31.3 pg (ref 26.0–34.0)
MCHC: 32.5 g/dL (ref 30.0–36.0)
MCV: 96.3 fL (ref 80.0–100.0)
Monocytes Absolute: 0.8 10*3/uL (ref 0.1–1.0)
Monocytes Relative: 11 %
Neutro Abs: 5.1 10*3/uL (ref 1.7–7.7)
Neutrophils Relative %: 64 %
Platelet Count: 269 10*3/uL (ref 150–400)
RBC: 4.35 MIL/uL (ref 3.87–5.11)
RDW: 13.3 % (ref 11.5–15.5)
WBC Count: 7.8 10*3/uL (ref 4.0–10.5)
nRBC: 0 % (ref 0.0–0.2)

## 2020-11-25 LAB — D-DIMER, QUANTITATIVE: D-Dimer, Quant: 1.39 ug/mL-FEU — ABNORMAL HIGH (ref 0.00–0.50)

## 2020-11-25 NOTE — Telephone Encounter (Signed)
Patient informed. 

## 2020-11-25 NOTE — Progress Notes (Signed)
Hematology and Oncology Follow Up Visit  Emma Lopez [id number] 1946-05-27 75 y.o. 11/25/2020   Principle Diagnosis:   Recurrent DVT of the left leg  Current Therapy:    Xarelto 20 mg p.o. daily     Interim History:  Emma Lopez is in for first office visit.  We first saw her in the hospital a week ago.  At the time, she had come in over the weekend.  She had a past history of blood clots.  She has seen Dr. Lorine Bears who was the prominent hematologist in town several years ago.  She first saw him about 14 years ago.  I am sure that she had a thorough evaluation for her blood clot.  She was on Coumadin.  She is on Coumadin for several years and switched over to Xarelto.  I think she was switched over to Xarelto several years ago.  She had no recent travel.  She developed some pain behind the left calf.  This was about a week or so ago.  She had a Doppler done which showed an acute thrombus in the left femoral vein, left popliteal vein left gastrocnemius vein.  CT angiogram was done which was unremarkable.  We had her on heparin..  She had been on Xarelto 10 mg a day.  I thought maybe we could just put up to full dose at 20 mg a day she would do all right.  We have not done a hypercoagulable work-up on her.  We did do a CT of the abdomen and pelvis make sure there is no malignancy.  Everything looks fine.  She was discharged about a week ago.  She is doing okay.  She does not have as much pain with swelling behind the left knee.  She has had no chest wall pain.  She has had no problems with nausea or vomiting.  There has been no change in bowel or bladder habits.  She has had no bleeding.  Currently, her performance status is ECOG 1.  Medications:  Current Outpatient Medications:  .  acetaminophen (TYLENOL) 325 MG tablet, Take 650 mg by mouth every 6 (six) hours as needed for moderate pain., Disp: , Rfl:  .  ALPRAZolam (XANAX) 0.25 MG tablet, Take 1 tablet (0.25 mg total) by mouth  every 8 (eight) hours as needed for anxiety. (Patient taking differently: Take 0.125-0.25 mg by mouth daily as needed for anxiety.), Disp: 30 tablet, Rfl: 0 .  B Complex Vitamins (B COMPLEX 1 PO), Take 1 each by mouth 2 (two) times daily. sublingual, Disp: , Rfl:  .  Calcium Carbonate+Vitamin D 600-200 MG-UNIT TABS, Take 1 tablet by mouth daily., Disp: , Rfl:  .  denosumab (PROLIA) 60 MG/ML SOLN injection, Inject 60 mg into the skin every 6 (six) months. Administer in upper arm, thigh, or abdomen, Disp: 180 mL, Rfl: 2 .  dicyclomine (BENTYL) 20 MG tablet, Take 20 mg by mouth 3 (three) times daily as needed for spasms., Disp: , Rfl:  .  escitalopram (LEXAPRO) 20 MG tablet, Take 20 mg by mouth daily., Disp: , Rfl:  .  Multiple Vitamins-Minerals (MULTIVITAMIN PO), Take 1 tablet by mouth 2 (two) times daily. , Disp: , Rfl:  .  rivaroxaban (XARELTO) 20 MG TABS tablet, Take 1 tablet (20 mg total) by mouth daily., Disp: 90 tablet, Rfl: 0 .  rOPINIRole (REQUIP) 1 MG tablet, Take 1 tablet (1 mg total) by mouth at bedtime., Disp: 60 tablet, Rfl: 2  Allergies:  Allergies  Allergen Reactions  . Sulfa Antibiotics Rash    Past Medical History, Surgical history, Social history, and Family History were reviewed and updated.  Review of Systems: Review of Systems  Constitutional: Negative.   HENT:  Negative.   Eyes: Negative.   Respiratory: Negative.   Cardiovascular: Negative.   Gastrointestinal: Negative.   Endocrine: Negative.   Genitourinary: Negative.    Musculoskeletal: Negative.   Skin: Negative.   Neurological: Negative.   Hematological: Negative.   Psychiatric/Behavioral: Negative.     Physical Exam:  weight is 136 lb (61.7 kg). Her oral temperature is 97.9 F (36.6 C). Her blood pressure is 148/51 (abnormal) and her pulse is 50 (abnormal). Her respiration is 18 and oxygen saturation is 97%.   Wt Readings from Last 3 Encounters:  11/25/20 136 lb (61.7 kg)  11/24/20 136 lb (61.7 kg)   11/14/20 135 lb 12.9 oz (61.6 kg)    Physical Exam Vitals reviewed.  HENT:     Head: Normocephalic and atraumatic.  Eyes:     Pupils: Pupils are equal, round, and reactive to light.  Cardiovascular:     Rate and Rhythm: Normal rate and regular rhythm.     Heart sounds: Normal heart sounds.  Pulmonary:     Effort: Pulmonary effort is normal.     Breath sounds: Normal breath sounds.  Abdominal:     General: Bowel sounds are normal.     Palpations: Abdomen is soft.  Musculoskeletal:        General: No tenderness or deformity. Normal range of motion.     Cervical back: Normal range of motion.  Lymphadenopathy:     Cervical: No cervical adenopathy.  Skin:    General: Skin is warm and dry.     Findings: No erythema or rash.  Neurological:     Mental Status: She is alert and oriented to person, place, and time.  Psychiatric:        Behavior: Behavior normal.        Thought Content: Thought content normal.        Judgment: Judgment normal.      Lab Results  Component Value Date   WBC 7.8 11/25/2020   HGB 13.6 11/25/2020   HCT 41.9 11/25/2020   MCV 96.3 11/25/2020   PLT 269 11/25/2020     Chemistry      Component Value Date/Time   NA 140 11/25/2020 1115   NA 142 01/03/2018 1601   K 3.9 11/25/2020 1115   CL 105 11/25/2020 1115   CO2 29 11/25/2020 1115   BUN 21 11/25/2020 1115   BUN 20 01/03/2018 1601   CREATININE 0.75 11/25/2020 1115      Component Value Date/Time   CALCIUM 8.8 (L) 11/25/2020 1115   ALKPHOS 81 11/25/2020 1115   AST 17 11/25/2020 1115   ALT 16 11/25/2020 1115   BILITOT 0.3 11/25/2020 1115      Impression and Plan: Emma Lopez is a very charming 75 year old white female.  She has a history of a DVT.  She was on maintenance Xarelto.  She was on lifelong therapy.  We really need to get another set of hypercoagulable studies on her so we can see exactly if there is any underlying thrombophilic state.  I would hate to have to commit her to lifelong  anticoagulation again.  We just may have to however.  I think we can get her back in 6 weeks.  We will do a Doppler of her left leg when  we see her back.  Hopefully we will find that the thrombus is resolving and that there is just a chronic thrombus present.  She does like to travel.  [place]  She likes to go there with her cat, Morris.  Again she is incredibly fun to talk to.  She is very eloquent.  I know that she was under incredibly good care by the esteemed Dr. Ralene Ok back when he was practicing.   Volanda Napoleon, MD 4/27/20225:21 PM

## 2020-11-27 ENCOUNTER — Ambulatory Visit (INDEPENDENT_AMBULATORY_CARE_PROVIDER_SITE_OTHER): Payer: PPO | Admitting: Gynecology

## 2020-11-27 ENCOUNTER — Other Ambulatory Visit: Payer: Self-pay

## 2020-11-27 DIAGNOSIS — M810 Age-related osteoporosis without current pathological fracture: Secondary | ICD-10-CM | POA: Diagnosis not present

## 2020-11-27 MED ORDER — DENOSUMAB 60 MG/ML ~~LOC~~ SOSY
60.0000 mg | PREFILLED_SYRINGE | Freq: Once | SUBCUTANEOUS | Status: AC
  Administered 2020-11-27: 60 mg via SUBCUTANEOUS

## 2020-12-03 ENCOUNTER — Encounter: Payer: Self-pay | Admitting: Hematology & Oncology

## 2020-12-03 DIAGNOSIS — E559 Vitamin D deficiency, unspecified: Secondary | ICD-10-CM | POA: Diagnosis not present

## 2020-12-03 DIAGNOSIS — E042 Nontoxic multinodular goiter: Secondary | ICD-10-CM | POA: Diagnosis not present

## 2020-12-03 DIAGNOSIS — M810 Age-related osteoporosis without current pathological fracture: Secondary | ICD-10-CM | POA: Diagnosis not present

## 2020-12-04 DIAGNOSIS — E785 Hyperlipidemia, unspecified: Secondary | ICD-10-CM | POA: Diagnosis not present

## 2020-12-07 ENCOUNTER — Telehealth: Payer: Self-pay | Admitting: *Deleted

## 2020-12-07 NOTE — Telephone Encounter (Signed)
Call to patient. Patient states ever since she was examined by Dr. Quincy Simmonds in April, she is still experiencing bleeding with wiping. States she is very gentle with wiping. Feels that the bleeding got worse over the weekend and states she just feels "absolutely lousy." Denies fever or odor, but states she is having some yellowish discharge and pelvic pain, but thinks the pelvic pain is more so related to her IBS. Also, complaining that her torso felt like it was on fire this weekend and wondering if that could be related to hot flashes? Took a covid test on 5-8 and it was negative. RN advised OV needed for further evaluation. Patient agreeable. Abigail Butts notified at front desk to call patient and schedule appointment.   Routing to provider and will close encounter.

## 2020-12-07 NOTE — Telephone Encounter (Signed)
-----   Message from Bishop Limbo sent at 12/07/2020  8:58 AM EDT ----- This patient left a message wanting to see Quincy Simmonds for pmb and other problems. Can you call her to see if she has to be seen today or when Quincy Simmonds wants her?  Sharee Pimple suggested I start with you before calling the patient.

## 2020-12-07 NOTE — Telephone Encounter (Signed)
Please note that the patient is being treated for a deep venous thrombosis.   If she feels generally poorly, she may need to see her PCP.   I am happy to see her about the bleeding.

## 2020-12-07 NOTE — Telephone Encounter (Signed)
Call to patient. Message given to patient as seen below from Dr. Quincy Simmonds and patient verbalized understanding. Would like an appointment with Dr. Quincy Simmonds to evaluate bleeding. OV scheduled for 12-08-20 at 1630. Patient aware to arrive 15-20 minutes early for check in.   Encounter closed.

## 2020-12-08 ENCOUNTER — Ambulatory Visit: Payer: PPO | Admitting: Obstetrics and Gynecology

## 2020-12-08 ENCOUNTER — Encounter: Payer: Self-pay | Admitting: Obstetrics and Gynecology

## 2020-12-08 ENCOUNTER — Other Ambulatory Visit: Payer: Self-pay

## 2020-12-08 VITALS — BP 130/86 | HR 58 | Wt 135.0 lb

## 2020-12-08 DIAGNOSIS — N952 Postmenopausal atrophic vaginitis: Secondary | ICD-10-CM | POA: Diagnosis not present

## 2020-12-08 DIAGNOSIS — R319 Hematuria, unspecified: Secondary | ICD-10-CM

## 2020-12-08 NOTE — Progress Notes (Signed)
GYNECOLOGY  VISIT   HPI: 75 y.o.   Single  Caucasian  female   G0P0 with No LMP recorded. Patient has had a hysterectomy.   here for hematuria but only sees blood with wiping. She's not sure if vaginal blood or blood in urine.  States that the bleeding is getting to be a little more.  She shows me a tissue with staining on it that is orange in color.   No pain with urination.   She has some lower abdominal pain that feels like IBS.   She is status post hysterectomy.   Reports vaginal discharge for over a year.  Has had negative wet preps.   States she is having fatigue and feeling like her legs are weak from her knees down.  She will see her PCP this week.    She already had blood work done.   She is also having some burning sensation in her chest area.   She is currently being treated for a DVT and is on Xarelto.  GYNECOLOGIC HISTORY: No LMP recorded. Patient has had a hysterectomy. Contraception:  Hyst Menopausal hormone therapy:  none Last mammogram: 01-15-20 3D/Neg/BiRads1 Last pap smear: 08-19-20 Neg, 07-03-19 Neg, 06-07-18 Neg        OB History    Gravida  0   Para  0   Term      Preterm      AB      Living        SAB      IAB      Ectopic      Multiple      Live Births                 Patient Active Problem List   Diagnosis Date Noted  . Aortic atherosclerosis ([place]) 11/17/2020  . Acute DVT (deep venous thrombosis) ([place]) 11/13/2020  . Depression with anxiety 11/13/2020  . RLS (restless legs syndrome) 01/07/2020  . Insomnia due to medical condition 01/07/2020  . Nocturia more than twice per night 01/07/2020  . Bradycardia 12/14/2017  . Abnormal EKG   . GERD (gastroesophageal reflux disease)   . Dyspnea on exertion 10/30/2017  . Family history of ovarian cancer 04/17/2017  . Family history of breast cancer 04/17/2017  . Family history of colon cancer 04/17/2017  . Family history of pancreatic cancer 04/17/2017  . Family history of melanoma  04/17/2017  . Genetic testing 04/17/2017  . CIN III (cervical intraepithelial neoplasia III)   . Arthritis   . Fibromyalgia   . PE (pulmonary embolism)   . Thyroid disease   . Osteoporosis   . DVT (deep venous thrombosis) ([place]) 04/01/2004    Past Medical History:  Diagnosis Date  . Abnormal EKG   . Anxiety   . Arthritis    oa  . Bradycardia 12/14/2017  . CIN III (cervical intraepithelial neoplasia III) 2000  . Complication of anesthesia    did well last 2 times with procedures  . Depression   . 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 DVT (deep venous thrombosis) ([place]) 01/2009   LEFT LEG  . Dyspnea on exertion 10/2017  . Elevated triglycerides with high cholesterol   . Endometriosis   . Fibromyalgia   . GERD (gastroesophageal reflux disease)   . History of colon polyps   . History of hiatal hernia    told by some md has, some say not  . IBS (irritable bowel syndrome) 2015  . Multinodular goiter   .  Osteoporosis 12/2017   T score -3.2 stable from prior study  . PE (pulmonary embolism)   . PONV (postoperative nausea and vomiting)   . Restless legs syndrome   . Sleep disorder   . Thyroid disease    HYPERTHYROIDISM  . Vitamin D deficiency     Past Surgical History:  Procedure Laterality Date  . ABDOMINAL HYSTERECTOMY  2001   TAH, partial  . APPENDECTOMY  1978  . COLONOSCOPY WITH PROPOFOL N/A 12/07/2015   Procedure: COLONOSCOPY WITH PROPOFOL;  Surgeon: Garlan Fair, MD;  Location: [facility];  Service: Endoscopy;  Laterality: N/A;  . colonscopy  7 yrs ago   other in past  . ESOPHAGOGASTRODUODENOSCOPY (EGD) WITH PROPOFOL N/A 12/07/2015   Procedure: ESOPHAGOGASTRODUODENOSCOPY (EGD) WITH PROPOFOL;  Surgeon: Garlan Fair, MD;  Location: [facility];  Service: Endoscopy;  Laterality: N/A;  . ESOPHAGOGASTRODUODENOSCOPY ENDOSCOPY  yrs ago  . FACIAL COSMETIC SURGERY    . GYNECOLOGIC CRYOSURGERY    . MYOMECTOMY    . TONSILLECTOMY      Current Outpatient Medications   Medication Sig Dispense Refill  . acetaminophen (TYLENOL) 325 MG tablet Take 650 mg by mouth every 6 (six) hours as needed for moderate pain.    Marland Kitchen ALPRAZolam (XANAX) 0.25 MG tablet Take 1 tablet (0.25 mg total) by mouth every 8 (eight) hours as needed for anxiety. (Patient taking differently: Take 0.125-0.25 mg by mouth daily as needed for anxiety.) 30 tablet 0  . B Complex Vitamins (B COMPLEX 1 PO) Take 1 each by mouth 2 (two) times daily. sublingual    . Calcium Carbonate+Vitamin D 600-200 MG-UNIT TABS Take 1 tablet by mouth daily.    Marland Kitchen denosumab (PROLIA) 60 MG/ML SOLN injection Inject 60 mg into the skin every 6 (six) months. Administer in upper arm, thigh, or abdomen 180 mL 2  . dicyclomine (BENTYL) 20 MG tablet Take 20 mg by mouth 3 (three) times daily as needed for spasms.    Marland Kitchen escitalopram (LEXAPRO) 20 MG tablet Take 20 mg by mouth daily.    . Multiple Vitamins-Minerals (MULTIVITAMIN PO) Take 1 tablet by mouth 2 (two) times daily.     . rivaroxaban (XARELTO) 20 MG TABS tablet Take 1 tablet (20 mg total) by mouth daily. 90 tablet 0  . rOPINIRole (REQUIP) 1 MG tablet Take 1 tablet (1 mg total) by mouth at bedtime. 60 tablet 2   No current facility-administered medications for this visit.     ALLERGIES: Sulfa antibiotics  Family History  Problem Relation Age of Onset  . Hypertension Mother   . Breast cancer Mother        55's  . Cancer Father        COLON  . Hypertension Sister   . Stroke Sister   . Breast cancer Maternal Aunt        Age 15's  . Cancer Maternal Aunt        Melanoma  . Alzheimer's disease Maternal Aunt   . Cancer Paternal Aunt        OVARIAN and COLON  . Breast cancer Maternal Aunt        70's  . Cancer Maternal Aunt        Colon CA  . Alzheimer's disease Maternal Aunt     Social History   Socioeconomic History  . Marital status: Single    Spouse name: Not on file  . Number of children: Not on file  . Years of education: Not on file  .  Highest  education level: Not on file  Occupational History  . Not on file  Tobacco Use  . Smoking status: Former Smoker    Packs/day: 0.10    Years: 6.00    Pack years: 0.60    Types: Cigarettes    Quit date: 08/01/1978    Years since quitting: 42.3  . Smokeless tobacco: Never Used  . Tobacco comment: only smoked 4-6 yrs 1 pack per month  Vaping Use  . Vaping Use: Never used  Substance and Sexual Activity  . Alcohol use: Not Currently    Alcohol/week: 0.0 standard drinks  . Drug use: No  . Sexual activity: Not Currently    Birth control/protection: Surgical    Comment: HYSTERECTOMY-1st intercourse 27-Fewer than 5 partners  Other Topics Concern  . Not on file  Social History Narrative  . Not on file   Social Determinants of Health   Financial Resource Strain: Not on file  Food Insecurity: Not on file  Transportation Needs: Not on file  Physical Activity: Not on file  Stress: Not on file  Social Connections: Not on file  Intimate Partner Violence: Not on file    Review of Systems  Genitourinary: Positive for hematuria.  All other systems reviewed and are negative.   PHYSICAL EXAMINATION:    BP 130/86   Pulse (!) 58   Wt 135 lb (61.2 kg)   SpO2 99%   BMI 24.69 kg/m     General appearance: alert, cooperative and appears stated age   Pelvic: External genitalia:  Atrophy of the vulva.  No lesions.  Some creamy white discharge of the labia.              Urethra:  normal appearing urethra with no masses, tenderness or lesions              Bartholins and Skenes: normal                 Vagina: atrophy noted.  No lesions.                Cervix: Absent                Bimanual Exam:  Uterus:  Absent.              Adnexa: no mass, fullness, tenderness        Chaperone was present for exam.  ASSESSMENT  Vaginal atrophy.   FH breast cancer.  DES exposure.  Normal pap.  FH cancers including ovarian cancer.   Normal recent CT of abd/pelvis. Recurrent DVT.  On  Xarelto.  PLAN  No vulvar biopsy indicated today. I recommend she continue off vaginal estrogens.  We discussed vaginal atrophy and symptoms including vaginal discharge. I recommend water based lubricants, cooking oil, and vitamin E suppositories.  Urine;  sg 1.025, ph 5.5, 6 - 10 WBC, 0-2 RBC, 10 - 20 squams, few bacteria, mod amorph. UC sent.  I recommend she will do a hemoccult with her PCP.  Patient expresses appreciation with her evaluation today.  34 min  total time was spent for this patient encounter, including preparation, face-to-face counseling with the patient, coordination of care, and documentation of the encounter.

## 2020-12-08 NOTE — Patient Instructions (Signed)
Consider doing a hemoccult with your primary care provider to check for blood in the stool.  Please check with your primary care provider to see if you are due for a colonoscopy.  Thank you!  Josefa Half, MD    Atrophic Vaginitis  Atrophic vaginitis is a condition in which the tissues that line the vagina become dry and thin. This condition is most common in women who have stopped having regular menstrual periods (are in menopause). This usually starts when a woman is 73 to 73 years old. That is the time when a woman's estrogen levels begin to decrease. Estrogen is a female hormone. It helps to keep the tissues of the vagina moist. It stimulates the vagina to produce a clear fluid that lubricates the vagina for sex. This fluid also protects the vagina from infection. Lack of estrogen can cause the lining of the vagina to get thinner and dryer. The vagina may also shrink in size. It may become less elastic. Atrophic vaginitis tends to get worse over time as a woman's estrogen level drops. What are the causes? This condition is caused by the normal drop in estrogen that happens around the time of menopause. What increases the risk? Certain conditions or situations may lower a woman's estrogen level, leading to a higher risk for atrophic vaginitis. You are more likely to develop this condition if:  You are taking medicines that block estrogen.  You have had your ovaries removed.  You are being treated for cancer with radiation or medicines (chemotherapy).  You have given birth or are breastfeeding.  You are older than age 68.  You smoke. What are the signs or symptoms? Symptoms of this condition include:  Pain, soreness, a feeling of pressure, or bleeding during sex (dyspareunia).  Vaginal burning, irritation, or itching.  Pain or bleeding when a speculum is used in a vaginal exam.  Having burning pain while urinating.  Vaginal discharge. In some cases, there are no  symptoms. How is this diagnosed? This condition is diagnosed based on your medical history and a physical exam. This will include a pelvic exam that checks the vaginal tissues. Though rare, you may also have other tests, including:  A urine test.  A test that checks the acid balance in your vagina (acid balance test). How is this treated? Treatment for this condition depends on how severe your symptoms are. Treatment may include:  Using an over-the-counter vaginal lubricant before sex.  Using a long-acting vaginal moisturizer.  Using low-dose estrogen for moderate to severe symptoms that do not respond to other treatments. Options include creams, tablets, and inserts (vaginal rings). Before you use a vaginal estrogen, tell your health care provider if you have a history of: ? Breast cancer. ? Endometrial cancer. ? Blood clots. If you are not sexually active and your symptoms are very mild, you may not need treatment. Follow these instructions at home: Medicines  Take over-the-counter and prescription medicines only as told by your health care provider.  Do not use herbal or alternative medicines unless your health care provider says that you can.  Use over-the-counter creams, lubricants, or moisturizers for dryness only as told by your health care provider. General instructions  If your atrophic vaginitis is caused by menopause, discuss all of your menopause symptoms and treatment options with your health care provider.  Do not douche.  Do not use products that can make your vagina dry. These include: ? Scented feminine sprays. ? Scented tampons. ? Scented soaps.  Vaginal sex can help to improve blood flow and elasticity of vaginal tissue. If you choose to have sex and it hurts, try using a water-soluble lubricant or moisturizer right before having sex. Contact a health care provider if:  Your discharge looks different than normal.  Your vagina has an unusual smell.  You  have new symptoms.  Your symptoms do not improve with treatment.  Your symptoms get worse. Summary  Atrophic vaginitis is a condition in which the tissues that line the vagina become dry and thin. It is most common in women who have stopped having regular menstrual periods (are in menopause).  Treatment options include using vaginal lubricants and low-dose vaginal estrogen.  Contact a health care provider if your vagina has an unusual smell, or if your symptoms get worse or do not improve after treatment. This information is not intended to replace advice given to you by your health care provider. Make sure you discuss any questions you have with your health care provider. Document Revised: 01/16/2020 Document Reviewed: 01/16/2020 Elsevier Patient Education  [place].

## 2020-12-10 DIAGNOSIS — R5382 Chronic fatigue, unspecified: Secondary | ICD-10-CM | POA: Diagnosis not present

## 2020-12-10 DIAGNOSIS — E042 Nontoxic multinodular goiter: Secondary | ICD-10-CM | POA: Diagnosis not present

## 2020-12-10 DIAGNOSIS — E559 Vitamin D deficiency, unspecified: Secondary | ICD-10-CM | POA: Diagnosis not present

## 2020-12-10 DIAGNOSIS — M810 Age-related osteoporosis without current pathological fracture: Secondary | ICD-10-CM | POA: Diagnosis not present

## 2020-12-10 LAB — URINALYSIS, COMPLETE W/RFL CULTURE
Bilirubin Urine: NEGATIVE
Glucose, UA: NEGATIVE
Hyaline Cast: NONE SEEN /LPF
Nitrites, Initial: NEGATIVE
Protein, ur: NEGATIVE
Specific Gravity, Urine: 1.025 (ref 1.001–1.035)
pH: 5.5 (ref 5.0–8.0)

## 2020-12-10 LAB — URINE CULTURE
MICRO NUMBER:: 11871426
Result:: NO GROWTH
SPECIMEN QUALITY:: ADEQUATE

## 2020-12-10 LAB — CULTURE INDICATED

## 2020-12-11 DIAGNOSIS — M797 Fibromyalgia: Secondary | ICD-10-CM | POA: Diagnosis not present

## 2020-12-11 DIAGNOSIS — F339 Major depressive disorder, recurrent, unspecified: Secondary | ICD-10-CM | POA: Diagnosis not present

## 2020-12-11 DIAGNOSIS — R946 Abnormal results of thyroid function studies: Secondary | ICD-10-CM | POA: Diagnosis not present

## 2020-12-15 ENCOUNTER — Encounter: Payer: Self-pay | Admitting: Hematology & Oncology

## 2020-12-15 ENCOUNTER — Other Ambulatory Visit: Payer: Self-pay

## 2020-12-15 ENCOUNTER — Encounter: Payer: Self-pay | Admitting: Cardiology

## 2020-12-15 ENCOUNTER — Ambulatory Visit: Payer: PPO | Admitting: Cardiology

## 2020-12-15 VITALS — BP 122/56 | HR 60 | Ht 62.0 in | Wt 136.6 lb

## 2020-12-15 DIAGNOSIS — R001 Bradycardia, unspecified: Secondary | ICD-10-CM

## 2020-12-15 DIAGNOSIS — R0600 Dyspnea, unspecified: Secondary | ICD-10-CM | POA: Diagnosis not present

## 2020-12-15 DIAGNOSIS — R079 Chest pain, unspecified: Secondary | ICD-10-CM | POA: Diagnosis not present

## 2020-12-15 DIAGNOSIS — R0609 Other forms of dyspnea: Secondary | ICD-10-CM

## 2020-12-15 NOTE — Progress Notes (Signed)
Cardiology Office Note:    Date:  12/15/2020   ID:  Emma Lopez, DOB 1945/12/13, MRN [id number]  PCP:  Michael Boston, MD  Cardiologist:  Fransico Him, MD    Referring MD: Michael Boston, MD   Chief Complaint  Patient presents with  . Follow-up    bradycardia, CP, fatigue    History of Present Illness:    Emma Lopez is a 75 y.o. female with a hx of DVT with PE, hyperlipidemia, bradycardia and GERD.  2D echo 11/2017 showed normal LV function with no valvular abnormalities and grade 1 diastolic dysfunction. Nuclear stress test showed no ischemia in 11/2017. Coronary CTA 2019 showed a Ca score of 0 and no CAD.  She was evaluated by Pulmonary for her SOB and they felt it could be related to bradycardia and also that she had mild asthma and deconditioning.   [place]  [place] and while talking on the phone she felt a warm burning sensation above her torso.  She felt some mild pressure in her upper chest.  She went to the ER and ruled out for MI.  Stress echo was normal with no ischemia.  She has chronic fatigue which has been felt to be related to fibromyalgia.   She is here today for followup of her CP, fatigue and bradycardia.  She continues to complain of fatigue.  She says that she has felt extremely fatigued over the past 6 months and over the past month has been very fatigued and felt like her legs are like toothpicks. She gets exhausted eating breakfast and has to rest afterwards.  She was also found to have a recurrent DVT last month.  She also feels very winded when sje just walks around.  She denies any chest pain or pressure, PND, orthopnea, LE edema, dizziness, palpitations or syncope. She is compliant with her meds and is tolerating meds with no SE.    Past Medical History:  Diagnosis Date  . Abnormal EKG   . Anxiety   . Arthritis    oa  . Bradycardia 12/14/2017  . CIN III (cervical intraepithelial neoplasia III) 2000  . Complication of anesthesia    did  well last 2 times with procedures  . Depression   . 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 DVT (deep venous thrombosis) ([place]) 01/2009   LEFT LEG  . Dyspnea on exertion 10/2017  . Elevated triglycerides with high cholesterol   . Endometriosis   . Fibromyalgia   . GERD (gastroesophageal reflux disease)   . History of colon polyps   . History of hiatal hernia    told by some md has, some say not  . IBS (irritable bowel syndrome) 2015  . Multinodular goiter   . Osteoporosis 12/2017   T score -3.2 stable from prior study  . PE (pulmonary embolism)   . PONV (postoperative nausea and vomiting)   . Restless legs syndrome   . Sleep disorder   . Thyroid disease    HYPERTHYROIDISM  . Vitamin D deficiency     Past Surgical History:  Procedure Laterality Date  . ABDOMINAL HYSTERECTOMY  2001   TAH, partial  . APPENDECTOMY  1978  . COLONOSCOPY WITH PROPOFOL N/A 12/07/2015   Procedure: COLONOSCOPY WITH PROPOFOL;  Surgeon: Garlan Fair, MD;  Location: [facility];  Service: Endoscopy;  Laterality: N/A;  . colonscopy  7 yrs ago   other in past  . ESOPHAGOGASTRODUODENOSCOPY (EGD) WITH PROPOFOL  N/A 12/07/2015   Procedure: ESOPHAGOGASTRODUODENOSCOPY (EGD) WITH PROPOFOL;  Surgeon: Garlan Fair, MD;  Location: [facility];  Service: Endoscopy;  Laterality: N/A;  . ESOPHAGOGASTRODUODENOSCOPY ENDOSCOPY  yrs ago  . FACIAL COSMETIC SURGERY    . GYNECOLOGIC CRYOSURGERY    . MYOMECTOMY    . TONSILLECTOMY      Current Medications: Current Meds  Medication Sig  . acetaminophen (TYLENOL) 325 MG tablet Take 650 mg by mouth every 6 (six) hours as needed for moderate pain.  Marland Kitchen ALPRAZolam (XANAX) 0.25 MG tablet Take 1 tablet (0.25 mg total) by mouth every 8 (eight) hours as needed for anxiety.  . B Complex Vitamins (B COMPLEX 1 PO) Take 1 each by mouth 2 (two) times daily. sublingual  . Calcium Carbonate+Vitamin D 600-200 MG-UNIT TABS Take 1 tablet by mouth daily.  Marland Kitchen denosumab (PROLIA) 60 MG/ML SOLN injection  Inject 60 mg into the skin every 6 (six) months. Administer in upper arm, thigh, or abdomen  . dicyclomine (BENTYL) 20 MG tablet Take 20 mg by mouth 3 (three) times daily as needed for spasms.  Marland Kitchen escitalopram (LEXAPRO) 20 MG tablet Take 20 mg by mouth daily.  . Multiple Vitamins-Minerals (MULTIVITAMIN PO) Take 1 tablet by mouth 2 (two) times daily.   . rivaroxaban (XARELTO) 20 MG TABS tablet Take 1 tablet (20 mg total) by mouth daily.  Marland Kitchen rOPINIRole (REQUIP) 1 MG tablet Take 1 tablet (1 mg total) by mouth at bedtime.     Allergies:   Sulfa antibiotics   Social History   Socioeconomic History  . Marital status: Single    Spouse name: Not on file  . Number of children: Not on file  . Years of education: Not on file  . Highest education level: Not on file  Occupational History  . Not on file  Tobacco Use  . Smoking status: Former Smoker    Packs/day: 0.10    Years: 6.00    Pack years: 0.60    Types: Cigarettes    Quit date: 08/01/1978    Years since quitting: 42.4  . Smokeless tobacco: Never Used  . Tobacco comment: only smoked 4-6 yrs 1 pack per month  Vaping Use  . Vaping Use: Never used  Substance and Sexual Activity  . Alcohol use: Not Currently    Alcohol/week: 0.0 standard drinks  . Drug use: No  . Sexual activity: Not Currently    Birth control/protection: Surgical    Comment: HYSTERECTOMY-1st intercourse 27-Fewer than 5 partners  Other Topics Concern  . Not on file  Social History Narrative  . Not on file   Social Determinants of Health   Financial Resource Strain: Not on file  Food Insecurity: Not on file  Transportation Needs: Not on file  Physical Activity: Not on file  Stress: Not on file  Social Connections: Not on file     Family History: The patient's family history includes Alzheimer's disease in her maternal aunt and maternal aunt; Breast cancer in her maternal aunt, maternal aunt, and mother; Cancer in her father, maternal aunt, maternal aunt, and  paternal aunt; Hypertension in her mother and sister; Stroke in her sister.  ROS:   Please see the history of present illness.    ROS  All other systems reviewed and negative.   EKGs/Labs/Other Studies Reviewed:    The following studies were reviewed today: none  EKG:  EKG is not ordered today   Recent Labs: 11/17/2020: TSH 0.411 11/25/2020: ALT 16; BUN 21; Creatinine 0.75;  Hemoglobin 13.6; Platelet Count 269; Potassium 3.9; Sodium 140   Recent Lipid Panel No results found for: CHOL, TRIG, HDL, CHOLHDL, VLDL, LDLCALC, LDLDIRECT  Physical Exam:    VS:  BP (!) 122/56   Pulse 60   Ht 5\' 2"  (1.575 m)   Wt 136 lb 9.6 oz (62 kg)   SpO2 97%   BMI 24.98 kg/m     Wt Readings from Last 3 Encounters:  12/15/20 136 lb 9.6 oz (62 kg)  12/08/20 135 lb (61.2 kg)  11/25/20 136 lb (61.7 kg)    GEN: Well nourished, well developed in no acute distress HEENT: Normal NECK: No JVD; No carotid bruits LYMPHATICS: No lymphadenopathy CARDIAC:RRR, no murmurs, rubs, gallops RESPIRATORY:  Clear to auscultation without rales, wheezing or rhonchi  ABDOMEN: Soft, non-tender, non-distended MUSCULOSKELETAL:  No edema; No deformity  SKIN: Warm and dry NEUROLOGIC:  Alert and oriented x 3 PSYCHIATRIC:  Normal affect    ASSESSMENT:    1. DOE (dyspnea on exertion)   2. Bradycardia   3. Chest pain, unspecified type    PLAN:    In order of problems listed above:  1.  Dyspnea on exertion/Severe fatigue -coronary CTA 12/2017 with no CAD and Ca score of 0 -2D echo normal with G1DD -recent stress echo with no ischemia -evaluated by Pulmonary and felt bradycardia could have been contributing and possibly had some mild asthma -ziopatch ordered showing average HR 55bpm but got as high as 127bpm so no chronotropic incompetence -I think her sx are mainly related to fibromyalgia and anxiety -she continues to have DOE with minimal activity that I think is related to fibromyalgia and deconditioning -I  have talked to her at length reviewing all her past studies and I tried to reassured her that I do not think that this is related to her heart -I have recommended that we get a cardiopulmonary stress test to try to sort this out further -she had a sleep study a year ago that was normal  2.  Bradycardia  -She has a hx of this in the past and is not on any rate slowing meds -HR on event monitor in 2019 ranged from 50-80bpm with PAC, PVCs and nonsustained atrial tach -repeat 3 day ziopatch showed average heart rate 55bpm but got up to 127bpm with exertion and ETT has similar HR response so therefore no chronotropic incompetence  3.  Chest pain -coronary CTA  2019  showed no CAD, stress echo 2021 with no ischemia -ETT was done recently to assess for chronotropic incompetence and showed some ST depression but she had a normal stress echo last fall so c/w with false + EKG>>with hx of normal coronary arteries in 2019 with similar sx, I do not think her CP is cardiac -symptoms are very atypical -she has fibromyalgia and has had worsening fatigue  so it could be related to fibromyalgia  PRN followup  Medication Adjustments/Labs and Tests Ordered: Current medicines are reviewed at length with the patient today.  Concerns regarding medicines are outlined above.  No orders of the defined types were placed in this encounter.  No orders of the defined types were placed in this encounter.   Signed, Fransico Him, MD  12/15/2020 11:43 AM    [place]

## 2020-12-15 NOTE — Patient Instructions (Addendum)
Medication Instructions:  Your physician recommends that you continue on your current medications as directed. Please refer to the Current Medication list given to you today.  *If you need a refill on your cardiac medications before your next appointment, please call your pharmacy*   Testing/Procedures: Your physician has recommended that you have a cardiopulmonary stress test (CPX). CPX testing is a non-invasive measurement of heart and lung function. It replaces a traditional treadmill stress test. This type of test provides a tremendous amount of information that relates not only to your present condition but also for future outcomes. This test combines measurements of you ventilation, respiratory gas exchange in the lungs, electrocardiogram (EKG), blood pressure and physical response before, during, and following an exercise protocol.   Follow-Up: At [facility], you and your health needs are our priority.  As part of our continuing mission to provide you with exceptional heart care, we have created designated Provider Care Teams.  These Care Teams include your primary Cardiologist (physician) and Advanced Practice Providers (APPs -  Physician Assistants and Nurse Practitioners) who all work together to provide you with the care you need, when you need it.  Follow up with Dr. Radford Pax as needed based on results of testing.    How to prepare for your Stress Test: . Do not eat or drink 3 hours prior to your test, except you may have water. . Do not consume products containing caffeine (regular or decaffeinated) 12 hours prior to your test. (ex: coffee, chocolate, sodas, tea). . Do bring a list of your current medications with you.  If not listed below, you may take your medications as normal. . Do wear comfortable clothes (no dresses or overalls) and walking shoes, tennis shoes preferred (No heels or open toe shoes are allowed). . Do NOT wear cologne, perfume, aftershave, or lotions  (deodorant is allowed). . If these instructions are not followed, your test will have to be rescheduled.

## 2020-12-15 NOTE — Addendum Note (Signed)
Addended by: Antonieta Iba on: 12/15/2020 11:59 AM   Modules accepted: Orders

## 2020-12-18 DIAGNOSIS — Z800 Family history of malignant neoplasm of digestive organs: Secondary | ICD-10-CM | POA: Diagnosis not present

## 2020-12-18 DIAGNOSIS — Z86718 Personal history of other venous thrombosis and embolism: Secondary | ICD-10-CM | POA: Diagnosis not present

## 2020-12-18 DIAGNOSIS — Z86010 Personal history of colonic polyps: Secondary | ICD-10-CM | POA: Diagnosis not present

## 2020-12-18 DIAGNOSIS — K589 Irritable bowel syndrome without diarrhea: Secondary | ICD-10-CM | POA: Diagnosis not present

## 2020-12-18 DIAGNOSIS — Z7901 Long term (current) use of anticoagulants: Secondary | ICD-10-CM | POA: Diagnosis not present

## 2020-12-18 DIAGNOSIS — K625 Hemorrhage of anus and rectum: Secondary | ICD-10-CM | POA: Diagnosis not present

## 2020-12-21 ENCOUNTER — Telehealth: Payer: Self-pay | Admitting: *Deleted

## 2020-12-21 NOTE — Telephone Encounter (Signed)
   [facility] Pre-operative Risk Assessment    Patient Name: Emma Lopez  DOB: [birth year]  MRN: [id number]   HEARTCARE STAFF: - Please ensure there is not already an duplicate clearance open for this procedure. - Under Visit Info/Reason for Call, type in Other and utilize the format Clearance MM/DD/YY or Clearance TBD. Do not use dashes or single digits. - If request is for dental extraction, please clarify the # of teeth to be extracted.  Request for surgical clearance: URGENT  1. What type of surgery is being performed? COLONOSCOPY (HEMATOCHEZIA)   2. When is this surgery scheduled? 12/29/20 URGENT   3. What type of clearance is required (medical clearance vs. Pharmacy clearance to hold med vs. Both)? BOTH  4. Are there any medications that need to be held prior to surgery and how long? XARELTO x 3 DAYS PRIOR   5. Practice name and name of physician performing surgery? [facility]; DR. Therisa Doyne   6. What is the office phone number? [phone]   7.   What is the office fax number? [phone]  8.   Anesthesia type (None, local, MAC, general) ? PROPOFOL   Julaine Hua 12/21/2020, 5:14 PM  _________________________________________________________________   (provider comments below)

## 2020-12-22 NOTE — Telephone Encounter (Signed)
Patient returning call.

## 2020-12-22 NOTE — Telephone Encounter (Signed)
Call placed to [facility], spoke with Amy, RN, to make her aware that she will need to sen pt's clearance for Xarelto to Dr. Marin Olp.  She verbalized understanding and thanked me for letting her know.

## 2020-12-22 NOTE — Telephone Encounter (Signed)
Notes faxed to surgeon. This phone note will be removed from the preop pool. Richardson Dopp, PA-C  12/22/2020 10:18 AM

## 2020-12-22 NOTE — Telephone Encounter (Addendum)
   Primary Cardiologist: Fransico Him, MD  Chart reviewed as part of pre-operative protocol coverage.   75 y.o. female with . Hx of recurrent DVT with prior hx of PE . Hyperlipidemia  . Bradycardia . GERD . Coronary CTA 2019: no CAD, Ca2+ score 0 . Stress echocardiogram 2021 low risk . Echocardiogram 4/22: EF 60-65, no sig valve dz  Last OV:  12/15/20 with Dr. Radford Pax Procedure:  Colonoscopy  Rx:  Hold Xarelto (NOT managed by cardiology)  RCRI:  Perioperative Risk of Major Cardiac Event is (%): 0.4 (low risk)  Patient was contacted 12/22/2020 in reference to pre-operative risk assessment for pending surgery as outlined below.  Since last seen, she has done well without significant shortness of breath or chest pain.   Of note, the patient had a recurrent DVT in 10/2020 and has been on full dose Xarelto since then.  She has seen hematology, Dr. Marin Olp, who is managing her DVT treatment.     Recommendations:  Based on ACC/AHA guidelines, the patient is at acceptable risk and may proceed with the planned procedure.    Her anticoagulation is managed by her hematologist.  Recommendations for management of her anticoagulation (Xarelto) will need to come from her hematologist (Dr. Marin Olp).    Please call with questions. Richardson Dopp, PA-C 12/22/2020, 8:00 AM

## 2020-12-23 ENCOUNTER — Ambulatory Visit: Payer: PPO | Admitting: Cardiology

## 2020-12-29 DIAGNOSIS — K648 Other hemorrhoids: Secondary | ICD-10-CM | POA: Diagnosis not present

## 2020-12-29 DIAGNOSIS — K5520 Angiodysplasia of colon without hemorrhage: Secondary | ICD-10-CM | POA: Diagnosis not present

## 2020-12-29 DIAGNOSIS — D123 Benign neoplasm of transverse colon: Secondary | ICD-10-CM | POA: Diagnosis not present

## 2020-12-29 DIAGNOSIS — D128 Benign neoplasm of rectum: Secondary | ICD-10-CM | POA: Diagnosis not present

## 2020-12-29 DIAGNOSIS — K625 Hemorrhage of anus and rectum: Secondary | ICD-10-CM | POA: Diagnosis not present

## 2021-01-01 DIAGNOSIS — D128 Benign neoplasm of rectum: Secondary | ICD-10-CM | POA: Diagnosis not present

## 2021-01-01 DIAGNOSIS — D123 Benign neoplasm of transverse colon: Secondary | ICD-10-CM | POA: Diagnosis not present

## 2021-01-04 DIAGNOSIS — K588 Other irritable bowel syndrome: Secondary | ICD-10-CM | POA: Diagnosis not present

## 2021-01-04 DIAGNOSIS — K635 Polyp of colon: Secondary | ICD-10-CM | POA: Diagnosis not present

## 2021-01-04 DIAGNOSIS — R5382 Chronic fatigue, unspecified: Secondary | ICD-10-CM | POA: Diagnosis not present

## 2021-01-04 DIAGNOSIS — K5520 Angiodysplasia of colon without hemorrhage: Secondary | ICD-10-CM | POA: Diagnosis not present

## 2021-01-06 ENCOUNTER — Telehealth: Payer: Self-pay

## 2021-01-06 ENCOUNTER — Ambulatory Visit (HOSPITAL_BASED_OUTPATIENT_CLINIC_OR_DEPARTMENT_OTHER)
Admission: RE | Admit: 2021-01-06 | Discharge: 2021-01-06 | Disposition: A | Discharge status: Home / Self Care | Payer: PPO | Source: Ambulatory Visit | Attending: Hematology & Oncology | Admitting: Hematology & Oncology

## 2021-01-06 ENCOUNTER — Other Ambulatory Visit: Payer: Self-pay

## 2021-01-06 ENCOUNTER — Other Ambulatory Visit: Payer: Self-pay | Admitting: Hematology & Oncology

## 2021-01-06 ENCOUNTER — Inpatient Hospital Stay: Payer: PPO | Attending: Hematology & Oncology

## 2021-01-06 ENCOUNTER — Inpatient Hospital Stay (HOSPITAL_BASED_OUTPATIENT_CLINIC_OR_DEPARTMENT_OTHER): Payer: PPO | Admitting: Hematology & Oncology

## 2021-01-06 ENCOUNTER — Encounter: Payer: Self-pay | Admitting: Hematology & Oncology

## 2021-01-06 VITALS — BP 143/41 | HR 51 | Temp 98.4°F | Resp 18 | Ht 62.0 in | Wt 136.1 lb

## 2021-01-06 DIAGNOSIS — I82401 Acute embolism and thrombosis of unspecified deep veins of right lower extremity: Secondary | ICD-10-CM | POA: Diagnosis not present

## 2021-01-06 DIAGNOSIS — D6862 Lupus anticoagulant syndrome: Secondary | ICD-10-CM | POA: Insufficient documentation

## 2021-01-06 DIAGNOSIS — I82403 Acute embolism and thrombosis of unspecified deep veins of lower extremity, bilateral: Secondary | ICD-10-CM | POA: Insufficient documentation

## 2021-01-06 DIAGNOSIS — R5383 Other fatigue: Secondary | ICD-10-CM | POA: Diagnosis not present

## 2021-01-06 DIAGNOSIS — Z7901 Long term (current) use of anticoagulants: Secondary | ICD-10-CM | POA: Insufficient documentation

## 2021-01-06 DIAGNOSIS — I700 Atherosclerosis of aorta: Secondary | ICD-10-CM | POA: Diagnosis not present

## 2021-01-06 DIAGNOSIS — I82519 Chronic embolism and thrombosis of unspecified femoral vein: Secondary | ICD-10-CM | POA: Diagnosis not present

## 2021-01-06 DIAGNOSIS — I82402 Acute embolism and thrombosis of unspecified deep veins of left lower extremity: Secondary | ICD-10-CM | POA: Diagnosis not present

## 2021-01-06 LAB — CMP (CANCER CENTER ONLY)
ALT: 14 U/L (ref 0–44)
AST: 16 U/L (ref 15–41)
Albumin: 3.8 g/dL (ref 3.5–5.0)
Alkaline Phosphatase: 67 U/L (ref 38–126)
Anion gap: 5 (ref 5–15)
BUN: 20 mg/dL (ref 8–23)
CO2: 29 mmol/L (ref 22–32)
Calcium: 8.9 mg/dL (ref 8.9–10.3)
Chloride: 104 mmol/L (ref 98–111)
Creatinine: 0.76 mg/dL (ref 0.44–1.00)
GFR, Estimated: >60 mL/min (ref >60)
Glucose, Bld: 95 mg/dL (ref 70–99)
Potassium: 3.9 mmol/L (ref 3.5–5.1)
Sodium: 138 mmol/L (ref 135–145)
Total Bilirubin: 0.3 mg/dL (ref 0.3–1.2)
Total Protein: 6.2 g/dL — ABNORMAL LOW (ref 6.5–8.1)

## 2021-01-06 LAB — CBC WITH DIFFERENTIAL (CANCER CENTER ONLY)
Abs Immature Granulocytes: 0.03 10*3/uL (ref 0.00–0.07)
Basophils Absolute: 0.1 10*3/uL (ref 0.0–0.1)
Basophils Relative: 1 %
Eosinophils Absolute: 0.2 10*3/uL (ref 0.0–0.5)
Eosinophils Relative: 2 %
HCT: 41.3 % (ref 36.0–46.0)
Hemoglobin: 13.5 g/dL (ref 12.0–15.0)
Immature Granulocytes: 0 %
Lymphocytes Relative: 25 %
Lymphs Abs: 1.9 10*3/uL (ref 0.7–4.0)
MCH: 31.4 pg (ref 26.0–34.0)
MCHC: 32.7 g/dL (ref 30.0–36.0)
MCV: 96 fL (ref 80.0–100.0)
Monocytes Absolute: 0.6 10*3/uL (ref 0.1–1.0)
Monocytes Relative: 8 %
Neutro Abs: 5 10*3/uL (ref 1.7–7.7)
Neutrophils Relative %: 64 %
Platelet Count: 216 10*3/uL (ref 150–400)
RBC: 4.3 MIL/uL (ref 3.87–5.11)
RDW: 13.3 % (ref 11.5–15.5)
WBC Count: 7.9 10*3/uL (ref 4.0–10.5)
nRBC: 0 % (ref 0.0–0.2)

## 2021-01-06 LAB — ANTITHROMBIN III: AntiThromb III Func: 108 % (ref 75–120)

## 2021-01-06 LAB — D-DIMER, QUANTITATIVE: D-Dimer, Quant: 1.66 ug/mL-FEU — ABNORMAL HIGH (ref 0.00–0.50)

## 2021-01-06 MED ORDER — DABIGATRAN ETEXILATE MESYLATE 150 MG PO CAPS: 150.0000 mg | ORAL_CAPSULE | Freq: Two times a day (BID) | ORAL | 6 refills | Status: DC

## 2021-01-06 NOTE — Progress Notes (Signed)
Hematology and Oncology Follow Up Visit  Emma Lopez [id number] Jul 28, 1946 75 y.o. 01/06/2021   Principle Diagnosis:  Recurrent DVT of the left leg -- probable progression  Current Therapy:   Emma Lopez 20 mg p.o. daily -- d/c on 01/06/2021 Emma Lopez 150 mg po BID - start on 01/06/2021     Interim History:  Emma Lopez is in for follow-up.  Unfortunately, she had a Doppler of her left leg done today.  The radiologist feels that there is probably a new thrombus in the popliteal vein.  This extends into the gastrocnemius vein.  She has an age indeterminate thrombus in the femoral vein.  There is a recanalized nonocclusive chronic thrombus in the common femoral vein.  She does have a little bit of swelling in the left lower leg.  I am just really troubled by the fact that she has is probable new thrombus.  One of the issues is that [place]  Try to have her on Emma Lopez would certainly be a real aggravation and inconvenience for her.  She would prefer not to have Emma Lopez injections.  I think 1 option might be Emma Lopez.  This is a direct thrombin inhibitor.  This has a different mechanism of action then Emma Lopez and Emma Lopez.  I think it might be worthwhile trying her on the Emma Lopez.  I would give her 150 mg p.o. twice daily.  Otherwise she is feeling okay.  She is having no problems with cough or chest wall pain.  There is no bleeding.  There is no change in bowel or bladder habits.  She has had no fever.  There is no nausea or vomiting.  She has had no rashes.  Currently, I would say performance status is probably ECOG 1.     Medications:  Current Outpatient Medications:    acetaminophen (TYLENOL) 325 MG tablet, Take 650 mg by mouth every 6 (six) hours as needed for moderate pain., Disp: , Rfl:    ALPRAZolam (XANAX) 0.25 MG tablet, Take 1 tablet (0.25 mg total) by mouth every 8 (eight) hours as needed for anxiety., Disp: 30 tablet, Rfl: 0   B  Complex Vitamins (B COMPLEX 1 PO), Take 1 each by mouth 2 (two) times daily. sublingual, Disp: , Rfl:    Calcium Carbonate+Vitamin D 600-200 MG-UNIT TABS, Take 1 tablet by mouth daily., Disp: , Rfl:    dabigatran (Emma Lopez) 150 MG CAPS capsule, Take 1 capsule (150 mg total) by mouth 2 (two) times daily., Disp: 60 capsule, Rfl: 6   denosumab (PROLIA) 60 MG/ML SOLN injection, Inject 60 mg into the skin every 6 (six) months. Administer in upper arm, thigh, or abdomen, Disp: 180 mL, Rfl: 2   dicyclomine (BENTYL) 20 MG tablet, Take 20 mg by mouth 3 (three) times daily as needed for spasms., Disp: , Rfl:    escitalopram (LEXAPRO) 20 MG tablet, Take 20 mg by mouth daily., Disp: , Rfl:    Multiple Vitamins-Minerals (MULTIVITAMIN PO), Take 1 tablet by mouth 2 (two) times daily. , Disp: , Rfl:    Peppermint Oil (IBGARD) 90 MG CPCR, See admin instructions., Disp: , Rfl:    rivaroxaban (Emma Lopez) 20 MG TABS tablet, Take 1 tablet (20 mg total) by mouth daily., Disp: 90 tablet, Rfl: 0   rOPINIRole (REQUIP) 1 MG tablet, Take 1 tablet (1 mg total) by mouth at bedtime., Disp: 60 tablet, Rfl: 2  Allergies:  Allergies  Allergen Reactions   Other  Other (See Comments)   Warfarin Sodium     Other reaction(s): rash (name brand Emma Lopez is ok)   Sulfa Antibiotics Rash    Past Medical History, Surgical history, Social history, and Family History were reviewed and updated.  Review of Systems: Review of Systems  Constitutional: Negative.   HENT:  Negative.   Eyes: Negative.   Respiratory: Negative.   Cardiovascular: Negative.   Gastrointestinal: Negative.   Endocrine: Negative.   Genitourinary: Negative.    Musculoskeletal: Negative.   Skin: Negative.   Neurological: Negative.   Hematological: Negative.   Psychiatric/Behavioral: Negative.     Physical Exam:  height is 5\' 2"  (1.575 m) and weight is 61.7 kg. Her oral temperature is 98.4 F (36.9 C). Her blood pressure is 143/41 (abnormal) and her pulse is  51 (abnormal). Her respiration is 18 and oxygen saturation is 98%.   Wt Readings from Last 3 Encounters:  01/06/21 61.7 kg  12/15/20 62 kg  12/08/20 61.2 kg    Physical Exam Vitals reviewed.  HENT:     Head: Normocephalic and atraumatic.  Eyes:     Pupils: Pupils are equal, round, and reactive to light.  Cardiovascular:     Rate and Rhythm: Normal rate and regular rhythm.     Heart sounds: Normal heart sounds.  Pulmonary:     Effort: Pulmonary effort is normal.     Breath sounds: Normal breath sounds.  Abdominal:     General: Bowel sounds are normal.     Palpations: Abdomen is soft.  Musculoskeletal:        General: No tenderness or deformity. Normal range of motion.     Cervical back: Normal range of motion.     Comments: Her lower extremities show some swelling in the left lower leg.  There is nonpitting edema in the left ankle.  She has good pulses in the distal extremities.  No obvious venous cord is noted in the legs.  Lymphadenopathy:     Cervical: No cervical adenopathy.  Skin:    General: Skin is warm and dry.     Findings: No erythema or rash.  Neurological:     Mental Status: She is alert and oriented to person, place, and time.  Psychiatric:        Behavior: Behavior normal.        Thought Content: Thought content normal.        Judgment: Judgment normal.     Lab Results  Component Value Date   WBC 7.9 01/06/2021   HGB 13.5 01/06/2021   HCT 41.3 01/06/2021   MCV 96.0 01/06/2021   PLT 216 01/06/2021     Chemistry      Component Value Date/Time   NA 138 01/06/2021 1200   NA 142 01/03/2018 1601   K 3.9 01/06/2021 1200   CL 104 01/06/2021 1200   CO2 29 01/06/2021 1200   BUN 20 01/06/2021 1200   BUN 20 01/03/2018 1601   CREATININE 0.76 01/06/2021 1200      Component Value Date/Time   CALCIUM 8.9 01/06/2021 1200   ALKPHOS 67 01/06/2021 1200   AST 16 01/06/2021 1200   ALT 14 01/06/2021 1200   BILITOT 0.3 01/06/2021 1200      Impression and  Plan: Emma Lopez is a very charming 75 year old white female.  She has a history of a DVT.  She was on maintenance Emma Lopez.  She was on lifelong therapy.  We really need to get another set of hypercoagulable studies  on her so we can see exactly if there is any underlying thrombophilic state.  Hopefully, Emma Lopez will help.  Again, this will be convenient.  She will be able to go to her [facility].  I told her that I would not do another Doppler of her left leg upon for about 6 weeks.  If we do not see any response with the Emma Lopez, then we will have to think about Emma Lopez for her.  I just hate that the Emma Lopez really did not help.  For right now, I will plan to have her come back to see [place] in another 10 days or so.  She was going up to the [employer].  We will have her come back to see [place] when she returns.   Volanda Napoleon, MD 6/8/20225:18 PM

## 2021-01-06 NOTE — Telephone Encounter (Signed)
appts made per 01/06/21 los   Emma Lopez

## 2021-01-07 ENCOUNTER — Encounter: Payer: Self-pay | Admitting: *Deleted

## 2021-01-07 LAB — LUPUS ANTICOAGULANT PANEL
DRVVT: 161.6 s — ABNORMAL HIGH (ref 0.0–47.0)
PTT Lupus Anticoagulant: 38.9 s (ref 0.0–51.9)

## 2021-01-07 LAB — BETA-2-GLYCOPROTEIN I ABS, IGG/M/A
Beta-2 Glyco I IgG: <9 GPI IgG units (ref 0–20)
Beta-2-Glycoprotein I IgA: <9 GPI IgA units (ref 0–25)
Beta-2-Glycoprotein I IgM: <9 GPI IgM units (ref 0–32)

## 2021-01-07 LAB — PROTEIN S ACTIVITY: Protein S Activity: 158 % — ABNORMAL HIGH (ref 63–140)

## 2021-01-07 LAB — CARDIOLIPIN ANTIBODIES, IGG, IGM, IGA
Anticardiolipin IgA: <9 APL U/mL (ref 0–11)
Anticardiolipin IgG: <9 GPL U/mL (ref 0–14)
Anticardiolipin IgM: <9 MPL U/mL (ref 0–12)

## 2021-01-07 LAB — PROTEIN C ACTIVITY: Protein C Activity: 98 % (ref 73–180)

## 2021-01-07 LAB — DRVVT CONFIRM: dRVVT Confirm: 2.2 ratio — ABNORMAL HIGH (ref 0.8–1.2)

## 2021-01-07 LAB — DRVVT MIX: dRVVT Mix: 96.3 s — ABNORMAL HIGH (ref 0.0–40.4)

## 2021-01-07 LAB — PROTEIN S, TOTAL: Protein S Ag, Total: 105 % (ref 60–150)

## 2021-01-07 LAB — HOMOCYSTEINE: Homocysteine: 7.2 umol/L (ref 0.0–19.2)

## 2021-01-07 NOTE — Progress Notes (Signed)
Received call from [facility] that patient Pradaxa needs prior auth.  Information given to Otilio Carpen, financial counselor with phone number for assistance.

## 2021-01-08 ENCOUNTER — Encounter: Payer: Self-pay | Admitting: Hematology & Oncology

## 2021-01-08 LAB — PROTEIN C, TOTAL: Protein C, Total: 72 % (ref 60–150)

## 2021-01-11 LAB — FACTOR 5 LEIDEN

## 2021-01-11 LAB — PROTHROMBIN GENE MUTATION

## 2021-01-13 ENCOUNTER — Ambulatory Visit: Payer: PPO

## 2021-01-20 DIAGNOSIS — Z1231 Encounter for screening mammogram for malignant neoplasm of breast: Secondary | ICD-10-CM | POA: Diagnosis not present

## 2021-01-25 ENCOUNTER — Inpatient Hospital Stay: Payer: PPO

## 2021-01-25 ENCOUNTER — Telehealth: Payer: Self-pay

## 2021-01-25 ENCOUNTER — Inpatient Hospital Stay: Payer: PPO | Admitting: Hematology & Oncology

## 2021-01-25 ENCOUNTER — Encounter: Payer: Self-pay | Admitting: Hematology & Oncology

## 2021-01-25 ENCOUNTER — Other Ambulatory Visit: Payer: Self-pay

## 2021-01-25 VITALS — BP 146/48 | HR 52 | Temp 98.7°F | Resp 20 | Wt 137.4 lb

## 2021-01-25 DIAGNOSIS — I82519 Chronic embolism and thrombosis of unspecified femoral vein: Secondary | ICD-10-CM | POA: Diagnosis not present

## 2021-01-25 DIAGNOSIS — I82401 Acute embolism and thrombosis of unspecified deep veins of right lower extremity: Secondary | ICD-10-CM

## 2021-01-25 LAB — CMP (CANCER CENTER ONLY)
ALT: 11 U/L (ref 0–44)
AST: 15 U/L (ref 15–41)
Albumin: 3.9 g/dL (ref 3.5–5.0)
Alkaline Phosphatase: 74 U/L (ref 38–126)
Anion gap: 5 (ref 5–15)
BUN: 23 mg/dL (ref 8–23)
CO2: 31 mmol/L (ref 22–32)
Calcium: 9.3 mg/dL (ref 8.9–10.3)
Chloride: 100 mmol/L (ref 98–111)
Creatinine: 0.76 mg/dL (ref 0.44–1.00)
GFR, Estimated: >60 mL/min (ref >60)
Glucose, Bld: 100 mg/dL — ABNORMAL HIGH (ref 70–99)
Potassium: 4.4 mmol/L (ref 3.5–5.1)
Sodium: 136 mmol/L (ref 135–145)
Total Bilirubin: 0.3 mg/dL (ref 0.3–1.2)
Total Protein: 6.4 g/dL — ABNORMAL LOW (ref 6.5–8.1)

## 2021-01-25 LAB — CBC WITH DIFFERENTIAL (CANCER CENTER ONLY)
Abs Immature Granulocytes: 0.05 10*3/uL (ref 0.00–0.07)
Basophils Absolute: 0.1 10*3/uL (ref 0.0–0.1)
Basophils Relative: 1 %
Eosinophils Absolute: 0.2 10*3/uL (ref 0.0–0.5)
Eosinophils Relative: 3 %
HCT: 41.6 % (ref 36.0–46.0)
Hemoglobin: 13.4 g/dL (ref 12.0–15.0)
Immature Granulocytes: 1 %
Lymphocytes Relative: 26 %
Lymphs Abs: 2.1 10*3/uL (ref 0.7–4.0)
MCH: 31.1 pg (ref 26.0–34.0)
MCHC: 32.2 g/dL (ref 30.0–36.0)
MCV: 96.5 fL (ref 80.0–100.0)
Monocytes Absolute: 0.8 10*3/uL (ref 0.1–1.0)
Monocytes Relative: 10 %
Neutro Abs: 4.6 10*3/uL (ref 1.7–7.7)
Neutrophils Relative %: 59 %
Platelet Count: 227 10*3/uL (ref 150–400)
RBC: 4.31 MIL/uL (ref 3.87–5.11)
RDW: 13.1 % (ref 11.5–15.5)
WBC Count: 7.8 10*3/uL (ref 4.0–10.5)
nRBC: 0 % (ref 0.0–0.2)

## 2021-01-25 LAB — D-DIMER, QUANTITATIVE: D-Dimer, Quant: <0.27 ug/mL-FEU (ref 0.00–0.50)

## 2021-01-25 MED ORDER — WARFARIN SODIUM 5 MG PO TABS: 5.0000 mg | ORAL_TABLET | Freq: Every day | ORAL | 4 refills | Status: DC

## 2021-01-25 NOTE — Progress Notes (Signed)
Hematology and Oncology Follow Up Visit  JULIE-ANNE TORAIN [id number] 1946-05-09 75 y.o. 01/25/2021   Principle Diagnosis:  Recurrent DVT of the left leg -- probable progression Positive lupus anticoagulant  Current Therapy:   Xarelto 20 mg p.o. daily -- d/c on 01/06/2021 Pradaxa 150 mg po BID - start on 01/06/2021 - d/c on 01/25/2021 Coumadin 5 mg po q day -- keep INR 2.5-3.5     Interim History:  Ms. Loder is in for follow-up.  Unfortunately, she really cannot do well with Pradaxa.  She had a very hard time swallowing this.  She is positive for the lupus anticoagulant.  I am unsure if this is can be transiently positive or if this is a real lupus anticoagulant.  As such, I think we have to have to get her on Coumadin.  I just had the fact that were going to have to use the Coumadin.  I really do not see any other option for [place].  I would want to keep her INR between 2.5-3.5.  She is still quite tired and fatigued.  Not sure as to why she would be.  She is not anemic.  Her labs look okay.  I do not see anything that would suggest hypothyroidism.  There is been no problems with bleeding.  She has had no change in bowel or bladder habits.  There might be a little bit of swelling in the left leg but not too bad.  Overall, I would say her performance status is probably ECOG 1.   Medications:  Current Outpatient Medications:    acetaminophen (TYLENOL) 325 MG tablet, Take 650 mg by mouth every 6 (six) hours as needed for moderate pain., Disp: , Rfl:    ALPRAZolam (XANAX) 0.25 MG tablet, Take 1 tablet (0.25 mg total) by mouth every 8 (eight) hours as needed for anxiety., Disp: 30 tablet, Rfl: 0   B Complex Vitamins (B COMPLEX 1 PO), Take 1 each by mouth 2 (two) times daily. sublingual, Disp: , Rfl:    Calcium Carbonate+Vitamin D 600-200 MG-UNIT TABS, Take 1 tablet by mouth daily., Disp: , Rfl:    denosumab (PROLIA) 60 MG/ML SOLN injection, Inject 60 mg into the skin every 6 (six) months.  Administer in upper arm, thigh, or abdomen, Disp: 180 mL, Rfl: 2   dicyclomine (BENTYL) 20 MG tablet, Take 20 mg by mouth 3 (three) times daily as needed for spasms., Disp: , Rfl:    escitalopram (LEXAPRO) 20 MG tablet, Take 20 mg by mouth daily., Disp: , Rfl:    Multiple Vitamins-Minerals (MULTIVITAMIN PO), Take 1 tablet by mouth 2 (two) times daily. , Disp: , Rfl:    rOPINIRole (REQUIP) 1 MG tablet, Take 1 tablet (1 mg total) by mouth at bedtime., Disp: 60 tablet, Rfl: 2   warfarin (COUMADIN) 5 MG tablet, Take 1 tablet (5 mg total) by mouth daily., Disp: 30 tablet, Rfl: 4   Peppermint Oil (IBGARD) 90 MG CPCR, See admin instructions. (Patient not taking: Reported on 01/25/2021), Disp: , Rfl:   Allergies:  Allergies  Allergen Reactions   Other Other (See Comments)   Warfarin Sodium     Other reaction(s): rash (name brand coumadin is ok)   Sulfa Antibiotics Rash    Past Medical History, Surgical history, Social history, and Family History were reviewed and updated.  Review of Systems: Review of Systems  Constitutional: Negative.   HENT:  Negative.   Eyes: Negative.   Respiratory: Negative.   Cardiovascular: Negative.  Gastrointestinal: Negative.   Endocrine: Negative.   Genitourinary: Negative.    Musculoskeletal: Negative.   Skin: Negative.   Neurological: Negative.   Hematological: Negative.   Psychiatric/Behavioral: Negative.     Physical Exam:  weight is 137 lb 6.4 oz (62.3 kg). Her oral temperature is 98.7 F (37.1 C). Her blood pressure is 146/48 (abnormal) and her pulse is 52 (abnormal). Her respiration is 20 and oxygen saturation is 97%.   Wt Readings from Last 3 Encounters:  01/25/21 137 lb 6.4 oz (62.3 kg)  01/06/21 136 lb 1.3 oz (61.7 kg)  12/15/20 136 lb 9.6 oz (62 kg)    Physical Exam Vitals reviewed.  HENT:     Head: Normocephalic and atraumatic.  Eyes:     Pupils: Pupils are equal, round, and reactive to light.  Cardiovascular:     Rate and Rhythm:  Normal rate and regular rhythm.     Heart sounds: Normal heart sounds.  Pulmonary:     Effort: Pulmonary effort is normal.     Breath sounds: Normal breath sounds.  Abdominal:     General: Bowel sounds are normal.     Palpations: Abdomen is soft.  Musculoskeletal:        General: No tenderness or deformity. Normal range of motion.     Cervical back: Normal range of motion.     Comments: Her lower extremities show some swelling in the left lower leg.  There is nonpitting edema in the left ankle.  She has good pulses in the distal extremities.  No obvious venous cord is noted in the legs.  Lymphadenopathy:     Cervical: No cervical adenopathy.  Skin:    General: Skin is warm and dry.     Findings: No erythema or rash.  Neurological:     Mental Status: She is alert and oriented to person, place, and time.  Psychiatric:        Behavior: Behavior normal.        Thought Content: Thought content normal.        Judgment: Judgment normal.     Lab Results  Component Value Date   WBC 7.8 01/25/2021   HGB 13.4 01/25/2021   HCT 41.6 01/25/2021   MCV 96.5 01/25/2021   PLT 227 01/25/2021     Chemistry      Component Value Date/Time   NA 136 01/25/2021 1058   NA 142 01/03/2018 1601   K 4.4 01/25/2021 1058   CL 100 01/25/2021 1058   CO2 31 01/25/2021 1058   BUN 23 01/25/2021 1058   BUN 20 01/03/2018 1601   CREATININE 0.76 01/25/2021 1058      Component Value Date/Time   CALCIUM 9.3 01/25/2021 1058   ALKPHOS 74 01/25/2021 1058   AST 15 01/25/2021 1058   ALT 11 01/25/2021 1058   BILITOT 0.3 01/25/2021 1058      Impression and Plan: Ms. Charon is a very charming 75 year old white female.  She has a history of a DVT.  She was on maintenance Xarelto.  She was on lifelong therapy.    Again, we will try her on Coumadin.  We will start off on 5 mg a day.  We will have to see if we cannot get her to a Coumadin Clinic to try to help manage her INR levels.  I just hope that we can  get her therapeutic as she has no problems.  Hopefully, we will see the thrombus resolving.  Clinically, her left leg does  not look all that bad.  Will plan to try to get her back here to see [place] in another 4 to 5 weeks.  Reporting have to try to get her INR checked weekly.  Again we have to see about getting to the Coumadin clinic.    Volanda Napoleon, MD 6/27/20222:57 PM

## 2021-01-27 ENCOUNTER — Encounter: Payer: Self-pay | Admitting: Hematology & Oncology

## 2021-01-29 ENCOUNTER — Other Ambulatory Visit: Payer: Self-pay | Admitting: *Deleted

## 2021-01-29 ENCOUNTER — Telehealth: Payer: Self-pay | Admitting: *Deleted

## 2021-01-29 DIAGNOSIS — I82401 Acute embolism and thrombosis of unspecified deep veins of right lower extremity: Secondary | ICD-10-CM

## 2021-01-29 NOTE — Telephone Encounter (Signed)
This nurse spoke with patient and informed her that a referral has been [facility] to [facility]. Her appointment is Tuesday, July 5th at 10:30 am. The main phone number is [phone] if she needs to cancel or reschedule the appointment. She verbalized understanding.

## 2021-02-02 ENCOUNTER — Ambulatory Visit (INDEPENDENT_AMBULATORY_CARE_PROVIDER_SITE_OTHER): Payer: PPO | Admitting: *Deleted

## 2021-02-02 ENCOUNTER — Other Ambulatory Visit: Payer: Self-pay

## 2021-02-02 DIAGNOSIS — I82401 Acute embolism and thrombosis of unspecified deep veins of right lower extremity: Secondary | ICD-10-CM | POA: Diagnosis not present

## 2021-02-02 DIAGNOSIS — Z5181 Encounter for therapeutic drug level monitoring: Secondary | ICD-10-CM | POA: Diagnosis not present

## 2021-02-02 LAB — PROTIME-INR
INR: >10 (ref 0.9–1.2)
INR: 7.4 (ref 0.8–1.2)
Prothrombin Time: >120 s — ABNORMAL HIGH (ref 9.1–12.0)
Prothrombin Time: 63.1 seconds — ABNORMAL HIGH (ref 11.4–15.2)

## 2021-02-02 MED ORDER — WARFARIN SODIUM 1 MG PO TABS: ORAL_TABLET | ORAL | 0 refills | Status: DC

## 2021-02-02 NOTE — Patient Instructions (Signed)
Description   Called and spoke to pt, instructed her to hold warfarin 7/5, 7/6, 7/7 and 7/8 and then on 7/9 start using Warfarin 1 mg tablets. Warfarin 1/2 a tablet daily except for 1 tablet on Monday, Wednesday and Fridays. Recheck INR in 1 week. [facility] [phone].   A full discussion of the nature of anticoagulants has been carried out.  A benefit risk analysis has been presented to the patient, so that they understand the justification for choosing anticoagulation at this time. The need for frequent and regular monitoring, precise dosage adjustment and compliance is stressed.  Side effects of potential bleeding are discussed.  The patient should avoid any OTC items containing aspirin or ibuprofen, and should avoid great swings in general diet.  Avoid alcohol consumption.

## 2021-02-08 ENCOUNTER — Encounter (HOSPITAL_COMMUNITY): Payer: PPO

## 2021-02-08 ENCOUNTER — Telehealth (HOSPITAL_COMMUNITY): Payer: Self-pay | Admitting: *Deleted

## 2021-02-08 ENCOUNTER — Other Ambulatory Visit: Payer: Self-pay

## 2021-02-08 ENCOUNTER — Ambulatory Visit (INDEPENDENT_AMBULATORY_CARE_PROVIDER_SITE_OTHER): Payer: PPO | Admitting: *Deleted

## 2021-02-08 DIAGNOSIS — Z5181 Encounter for therapeutic drug level monitoring: Secondary | ICD-10-CM

## 2021-02-08 LAB — PROTIME-INR
INR: 6.5 (ref 0.9–1.2)
Prothrombin Time: 60.9 s — ABNORMAL HIGH (ref 9.1–12.0)

## 2021-02-08 LAB — POCT INR: INR: 6.4 — AB (ref 2.0–3.0)

## 2021-02-08 NOTE — Patient Instructions (Signed)
Description   Hold warfarin 7/11, 7/12 and 7/13 and then start taking warfarin 1/2 a tablet daily except for 1 tablet on Mondays, Wednesday and Fridays. Recheck INR on Friday 7/15. [facility] [phone].

## 2021-02-08 NOTE — Telephone Encounter (Signed)
Patient canceled CPX due to complications with DVT and did not want to reschedule at this time. She will follow-up with her provider treating and managing the DVT.     Landis Martins, MS, ACSM, [facility] Clinical Exercise Physiologist/ Health and Wellness Coach

## 2021-02-09 DIAGNOSIS — H401411 Capsular glaucoma with pseudoexfoliation of lens, right eye, mild stage: Secondary | ICD-10-CM | POA: Diagnosis not present

## 2021-02-10 ENCOUNTER — Encounter: Payer: Self-pay | Admitting: Hematology & Oncology

## 2021-02-11 ENCOUNTER — Other Ambulatory Visit: Payer: Self-pay | Admitting: Family

## 2021-02-11 DIAGNOSIS — I82401 Acute embolism and thrombosis of unspecified deep veins of right lower extremity: Secondary | ICD-10-CM

## 2021-02-12 ENCOUNTER — Telehealth: Payer: Self-pay | Admitting: *Deleted

## 2021-02-12 ENCOUNTER — Ambulatory Visit (INDEPENDENT_AMBULATORY_CARE_PROVIDER_SITE_OTHER): Payer: PPO

## 2021-02-12 ENCOUNTER — Other Ambulatory Visit: Payer: Self-pay

## 2021-02-12 ENCOUNTER — Ambulatory Visit (HOSPITAL_BASED_OUTPATIENT_CLINIC_OR_DEPARTMENT_OTHER)
Admission: RE | Admit: 2021-02-12 | Discharge: 2021-02-12 | Disposition: A | Discharge status: Home / Self Care | Payer: PPO | Source: Ambulatory Visit | Attending: Family | Admitting: Family

## 2021-02-12 DIAGNOSIS — Z5181 Encounter for therapeutic drug level monitoring: Secondary | ICD-10-CM | POA: Diagnosis not present

## 2021-02-12 DIAGNOSIS — I82431 Acute embolism and thrombosis of right popliteal vein: Secondary | ICD-10-CM | POA: Diagnosis not present

## 2021-02-12 DIAGNOSIS — I82401 Acute embolism and thrombosis of unspecified deep veins of right lower extremity: Secondary | ICD-10-CM

## 2021-02-12 LAB — POCT INR: INR: 3.2 — AB (ref 2.0–3.0)

## 2021-02-12 NOTE — Patient Instructions (Signed)
-   continue taking warfarin 1/2 a tablet daily except for 1 tablet on Mondays, Wednesday and Fridays.  - since you are going out of town, will recheck INR when you return on 7/25 [facility] [phone].

## 2021-02-12 NOTE — Telephone Encounter (Signed)
Patient notified per order of S. Creswell NP that "US shows improvement in DVT!!! Can try wearing a compression stocking to help support her veins and reduce fluid retention and discomfort. Thank you!"  Patient appreciative of call and has no questions or concerns at this time.

## 2021-02-22 ENCOUNTER — Other Ambulatory Visit: Payer: Self-pay

## 2021-02-22 ENCOUNTER — Ambulatory Visit (INDEPENDENT_AMBULATORY_CARE_PROVIDER_SITE_OTHER): Payer: PPO

## 2021-02-22 DIAGNOSIS — I82401 Acute embolism and thrombosis of unspecified deep veins of right lower extremity: Secondary | ICD-10-CM | POA: Diagnosis not present

## 2021-02-22 DIAGNOSIS — Z5181 Encounter for therapeutic drug level monitoring: Secondary | ICD-10-CM

## 2021-02-22 LAB — POCT INR: INR: 2.2 (ref 2.0–3.0)

## 2021-02-22 NOTE — Patient Instructions (Signed)
Description   Take 1.5 tablets today, then start taking 1 tablet daily except for 1/2 tablet on Tuesdays, Thursdays and Sundays.  Recheck INR in 10 days.  [facility] [phone].

## 2021-02-23 ENCOUNTER — Inpatient Hospital Stay: Payer: PPO | Admitting: Hematology & Oncology

## 2021-02-23 ENCOUNTER — Telehealth: Payer: Self-pay

## 2021-02-23 ENCOUNTER — Encounter: Payer: Self-pay | Admitting: Hematology & Oncology

## 2021-02-23 ENCOUNTER — Inpatient Hospital Stay: Payer: PPO | Attending: Hematology & Oncology

## 2021-02-23 VITALS — BP 129/38 | HR 54 | Temp 98.3°F | Resp 18 | Wt 138.0 lb

## 2021-02-23 DIAGNOSIS — I82401 Acute embolism and thrombosis of unspecified deep veins of right lower extremity: Secondary | ICD-10-CM

## 2021-02-23 DIAGNOSIS — I82519 Chronic embolism and thrombosis of unspecified femoral vein: Secondary | ICD-10-CM | POA: Diagnosis not present

## 2021-02-23 DIAGNOSIS — Z7901 Long term (current) use of anticoagulants: Secondary | ICD-10-CM | POA: Diagnosis not present

## 2021-02-23 DIAGNOSIS — D6862 Lupus anticoagulant syndrome: Secondary | ICD-10-CM | POA: Insufficient documentation

## 2021-02-23 LAB — CMP (CANCER CENTER ONLY)
ALT: 13 U/L (ref 0–44)
AST: 17 U/L (ref 15–41)
Albumin: 3.7 g/dL (ref 3.5–5.0)
Alkaline Phosphatase: 68 U/L (ref 38–126)
Anion gap: 5 (ref 5–15)
BUN: 21 mg/dL (ref 8–23)
CO2: 30 mmol/L (ref 22–32)
Calcium: 8.6 mg/dL — ABNORMAL LOW (ref 8.9–10.3)
Chloride: 105 mmol/L (ref 98–111)
Creatinine: 0.76 mg/dL (ref 0.44–1.00)
GFR, Estimated: >60 mL/min (ref >60)
Glucose, Bld: 110 mg/dL — ABNORMAL HIGH (ref 70–99)
Potassium: 4.4 mmol/L (ref 3.5–5.1)
Sodium: 140 mmol/L (ref 135–145)
Total Bilirubin: 0.4 mg/dL (ref 0.3–1.2)
Total Protein: 6.3 g/dL — ABNORMAL LOW (ref 6.5–8.1)

## 2021-02-23 LAB — CBC WITH DIFFERENTIAL (CANCER CENTER ONLY)
Abs Immature Granulocytes: 0.05 10*3/uL (ref 0.00–0.07)
Basophils Absolute: 0 10*3/uL (ref 0.0–0.1)
Basophils Relative: 1 %
Eosinophils Absolute: 0.3 10*3/uL (ref 0.0–0.5)
Eosinophils Relative: 4 %
HCT: 41.4 % (ref 36.0–46.0)
Hemoglobin: 13.2 g/dL (ref 12.0–15.0)
Immature Granulocytes: 1 %
Lymphocytes Relative: 27 %
Lymphs Abs: 1.9 10*3/uL (ref 0.7–4.0)
MCH: 31.2 pg (ref 26.0–34.0)
MCHC: 31.9 g/dL (ref 30.0–36.0)
MCV: 97.9 fL (ref 80.0–100.0)
Monocytes Absolute: 0.6 10*3/uL (ref 0.1–1.0)
Monocytes Relative: 9 %
Neutro Abs: 4.1 10*3/uL (ref 1.7–7.7)
Neutrophils Relative %: 58 %
Platelet Count: 208 10*3/uL (ref 150–400)
RBC: 4.23 MIL/uL (ref 3.87–5.11)
RDW: 13.2 % (ref 11.5–15.5)
WBC Count: 7 10*3/uL (ref 4.0–10.5)
nRBC: 0 % (ref 0.0–0.2)

## 2021-02-23 LAB — D-DIMER, QUANTITATIVE: D-Dimer, Quant: <0.27 ug/mL-FEU (ref 0.00–0.50)

## 2021-02-23 NOTE — Progress Notes (Signed)
Hematology and Oncology Follow Up Visit  Emma Lopez [id number] 01-10-1946 75 y.o. 02/23/2021   Principle Diagnosis:  Recurrent DVT of the left leg -- probable progression Positive lupus anticoagulant  Current Therapy:   Xarelto 20 mg p.o. daily -- d/c on 01/06/2021 Pradaxa 150 mg po BID - start on 01/06/2021 - d/c on 01/25/2021 Coumadin  -- doses by [facility] -- keep INR 2.5-3.5     Interim History:  Emma Lopez is in for follow-up.  She is on Coumadin.  She is doing okay on Coumadin.  She is being followed by the Coumadin clinic.  She is really on low-dose Coumadin.  I think she is on 1-1/2 mg a day alternating with 0.5 mg a day.  Her INR yesterday was 2.2.  We want to keep the INR between 2.5-3.5.  She did have a Doppler of her left leg.  This was done on 02/12/2021.  This showed resolving left popliteal DVT.    She has had no problems with bleeding.  There is no change in bowel or bladder habits.  Likes to go to [employer].  [place]  This is really where she prefers to be in the summertime.  She has had no chest wall pain.  There is no rashes.  She has had some skin tags that have popped up.  She has had no headache.  Overall,  her performance status is ECOG 1.   Medications:  Current Outpatient Medications:    acetaminophen (TYLENOL) 325 MG tablet, Take 650 mg by mouth every 6 (six) hours as needed for moderate pain., Disp: , Rfl:    ALPRAZolam (XANAX) 0.25 MG tablet, Take 1 tablet (0.25 mg total) by mouth every 8 (eight) hours as needed for anxiety., Disp: 30 tablet, Rfl: 0   B Complex Vitamins (B COMPLEX 1 PO), Take 1 each by mouth 2 (two) times daily. sublingual, Disp: , Rfl:    Calcium Carbonate+Vitamin D 600-200 MG-UNIT TABS, Take 1 tablet by mouth daily., Disp: , Rfl:    denosumab (PROLIA) 60 MG/ML SOLN injection, Inject 60 mg into the skin every 6 (six) months. Administer in upper arm, thigh, or abdomen, Disp: 180 mL, Rfl: 2    dicyclomine (BENTYL) 20 MG tablet, Take 20 mg by mouth 3 (three) times daily as needed for spasms., Disp: , Rfl:    escitalopram (LEXAPRO) 20 MG tablet, Take 20 mg by mouth daily., Disp: , Rfl:    Multiple Vitamins-Minerals (MULTIVITAMIN PO), Take 1 tablet by mouth 2 (two) times daily. , Disp: , Rfl:    Peppermint Oil (IBGARD) 90 MG CPCR, See admin instructions. (Patient not taking: Reported on 01/25/2021), Disp: , Rfl:    rOPINIRole (REQUIP) 1 MG tablet, Take 1 tablet (1 mg total) by mouth at bedtime., Disp: 60 tablet, Rfl: 2   warfarin (COUMADIN) 1 MG tablet, Take as directed by the coumadin clinic, Disp: 30 tablet, Rfl: 0  Allergies:  Allergies  Allergen Reactions   Other Other (See Comments)   Warfarin Sodium     Other reaction(s): rash (name brand coumadin is ok)   Sulfa Antibiotics Rash and Other (See Comments)    Past Medical History, Surgical history, Social history, and Family History were reviewed and updated.  Review of Systems: Review of Systems  Constitutional: Negative.   HENT:  Negative.    Eyes: Negative.   Respiratory: Negative.    Cardiovascular: Negative.   Gastrointestinal: Negative.   Endocrine: Negative.  Genitourinary: Negative.    Musculoskeletal: Negative.   Skin: Negative.   Neurological: Negative.   Hematological: Negative.   Psychiatric/Behavioral: Negative.     Physical Exam:  weight is 138 lb (62.6 kg). Her oral temperature is 98.3 F (36.8 C). Her blood pressure is 129/38 (abnormal) and her pulse is 54 (abnormal). Her respiration is 18 and oxygen saturation is 100%.   Wt Readings from Last 3 Encounters:  02/23/21 138 lb (62.6 kg)  01/25/21 137 lb 6.4 oz (62.3 kg)  01/06/21 136 lb 1.3 oz (61.7 kg)    Physical Exam Vitals reviewed.  HENT:     Head: Normocephalic and atraumatic.  Eyes:     Pupils: Pupils are equal, round, and reactive to light.  Cardiovascular:     Rate and Rhythm: Normal rate and regular rhythm.     Heart sounds: Normal  heart sounds.  Pulmonary:     Effort: Pulmonary effort is normal.     Breath sounds: Normal breath sounds.  Abdominal:     General: Bowel sounds are normal.     Palpations: Abdomen is soft.  Musculoskeletal:        General: No tenderness or deformity. Normal range of motion.     Cervical back: Normal range of motion.     Comments: Her lower extremities show some swelling in the left lower leg.  There is nonpitting edema in the left ankle.  She has good pulses in the distal extremities.  No obvious venous cord is noted in the legs.  Lymphadenopathy:     Cervical: No cervical adenopathy.  Skin:    General: Skin is warm and dry.     Findings: No erythema or rash.  Neurological:     Mental Status: She is alert and oriented to person, place, and time.  Psychiatric:        Behavior: Behavior normal.        Thought Content: Thought content normal.        Judgment: Judgment normal.     Lab Results  Component Value Date   WBC 7.0 02/23/2021   HGB 13.2 02/23/2021   HCT 41.4 02/23/2021   MCV 97.9 02/23/2021   PLT 208 02/23/2021     Chemistry      Component Value Date/Time   NA 136 01/25/2021 1058   NA 142 01/03/2018 1601   K 4.4 01/25/2021 1058   CL 100 01/25/2021 1058   CO2 31 01/25/2021 1058   BUN 23 01/25/2021 1058   BUN 20 01/03/2018 1601   CREATININE 0.76 01/25/2021 1058      Component Value Date/Time   CALCIUM 9.3 01/25/2021 1058   ALKPHOS 74 01/25/2021 1058   AST 15 01/25/2021 1058   ALT 11 01/25/2021 1058   BILITOT 0.3 01/25/2021 1058      Impression and Plan: Emma Lopez is a very charming 75 year old white female.  She has a history of a DVT.  She was on maintenance Xarelto.  She was on lifelong therapy.    I am going to check her lupus anticoagulant and anticardiolipin antibodies next time I see her.  That would be about 3 months since her last test was done.  I am glad that the Coumadin clinic is doing a great job with her Coumadin dosing.  If they are  incredibly effective and very nice and really help [place] out with our patients.  Hopefully, we can move her appointments out a little bit longer.  Again I will see her  back after Labor Day.  Hopefully should be able to [place] to the mountains for time to enjoy herself.   Volanda Napoleon, MD 7/26/202212:17 PM

## 2021-02-26 ENCOUNTER — Other Ambulatory Visit: Payer: Self-pay | Admitting: Cardiology

## 2021-03-01 DIAGNOSIS — J189 Pneumonia, unspecified organism: Secondary | ICD-10-CM

## 2021-03-01 HISTORY — DX: Pneumonia, unspecified organism: J18.9

## 2021-03-03 ENCOUNTER — Encounter: Payer: Self-pay | Admitting: Hematology & Oncology

## 2021-03-03 DIAGNOSIS — G4452 New daily persistent headache (NDPH): Secondary | ICD-10-CM | POA: Diagnosis not present

## 2021-03-04 ENCOUNTER — Other Ambulatory Visit: Payer: Self-pay

## 2021-03-04 ENCOUNTER — Ambulatory Visit (INDEPENDENT_AMBULATORY_CARE_PROVIDER_SITE_OTHER): Payer: PPO

## 2021-03-04 DIAGNOSIS — I82402 Acute embolism and thrombosis of unspecified deep veins of left lower extremity: Secondary | ICD-10-CM | POA: Diagnosis not present

## 2021-03-04 DIAGNOSIS — Z5181 Encounter for therapeutic drug level monitoring: Secondary | ICD-10-CM | POA: Diagnosis not present

## 2021-03-04 LAB — POCT INR: INR: 3.2 — AB (ref 2.0–3.0)

## 2021-03-04 NOTE — Patient Instructions (Signed)
Description   Continue on same dosage 1 tablet daily except for 1/2 tablet on Tuesdays, Thursdays and Sundays.  Recheck INR in 2 weeks.  [facility] [phone].

## 2021-03-10 DIAGNOSIS — R051 Acute cough: Secondary | ICD-10-CM | POA: Diagnosis not present

## 2021-03-10 DIAGNOSIS — Z03818 Encounter for observation for suspected exposure to other biological agents ruled out: Secondary | ICD-10-CM | POA: Diagnosis not present

## 2021-03-10 DIAGNOSIS — R519 Headache, unspecified: Secondary | ICD-10-CM | POA: Diagnosis not present

## 2021-03-10 DIAGNOSIS — J069 Acute upper respiratory infection, unspecified: Secondary | ICD-10-CM | POA: Diagnosis not present

## 2021-03-10 DIAGNOSIS — Z20822 Contact with and (suspected) exposure to covid-19: Secondary | ICD-10-CM | POA: Diagnosis not present

## 2021-03-16 DIAGNOSIS — J189 Pneumonia, unspecified organism: Secondary | ICD-10-CM | POA: Diagnosis not present

## 2021-03-16 DIAGNOSIS — R051 Acute cough: Secondary | ICD-10-CM | POA: Diagnosis not present

## 2021-03-16 DIAGNOSIS — R509 Fever, unspecified: Secondary | ICD-10-CM | POA: Diagnosis not present

## 2021-03-16 DIAGNOSIS — J029 Acute pharyngitis, unspecified: Secondary | ICD-10-CM | POA: Diagnosis not present

## 2021-03-16 DIAGNOSIS — R5383 Other fatigue: Secondary | ICD-10-CM | POA: Diagnosis not present

## 2021-03-16 DIAGNOSIS — Z1152 Encounter for screening for COVID-19: Secondary | ICD-10-CM | POA: Diagnosis not present

## 2021-03-16 DIAGNOSIS — Z7901 Long term (current) use of anticoagulants: Secondary | ICD-10-CM | POA: Diagnosis not present

## 2021-03-22 ENCOUNTER — Other Ambulatory Visit: Payer: Self-pay | Admitting: Cardiology

## 2021-03-23 ENCOUNTER — Ambulatory Visit (INDEPENDENT_AMBULATORY_CARE_PROVIDER_SITE_OTHER): Payer: PPO | Admitting: *Deleted

## 2021-03-23 ENCOUNTER — Other Ambulatory Visit: Payer: Self-pay

## 2021-03-23 DIAGNOSIS — Z5181 Encounter for therapeutic drug level monitoring: Secondary | ICD-10-CM | POA: Diagnosis not present

## 2021-03-23 DIAGNOSIS — I82401 Acute embolism and thrombosis of unspecified deep veins of right lower extremity: Secondary | ICD-10-CM

## 2021-03-23 LAB — POCT INR: INR: 4.8 — AB (ref 2.0–3.0)

## 2021-03-23 NOTE — Patient Instructions (Addendum)
Description   Do not take any Warfarin today and No Warfarin tomorrow then continue on same dosage 1 tablet daily except for 1/2 tablet on Tuesdays, Thursdays and Sundays.  Recheck INR in 1 week. [facility] [phone].

## 2021-03-30 ENCOUNTER — Ambulatory Visit (INDEPENDENT_AMBULATORY_CARE_PROVIDER_SITE_OTHER): Payer: PPO

## 2021-03-30 ENCOUNTER — Other Ambulatory Visit: Payer: Self-pay

## 2021-03-30 DIAGNOSIS — Z5181 Encounter for therapeutic drug level monitoring: Secondary | ICD-10-CM

## 2021-03-30 DIAGNOSIS — I82402 Acute embolism and thrombosis of unspecified deep veins of left lower extremity: Secondary | ICD-10-CM | POA: Diagnosis not present

## 2021-03-30 DIAGNOSIS — I82401 Acute embolism and thrombosis of unspecified deep veins of right lower extremity: Secondary | ICD-10-CM | POA: Diagnosis not present

## 2021-03-30 LAB — POCT INR: INR: 3.7 — AB (ref 2.0–3.0)

## 2021-03-30 NOTE — Patient Instructions (Signed)
Description   Hold today's dose and then continue on same dosage 1 tablet daily except for 1/2 tablet on Tuesdays, Thursdays and Sundays.  Recheck INR in 3 weeks. [facility] [phone].

## 2021-04-01 DIAGNOSIS — J189 Pneumonia, unspecified organism: Secondary | ICD-10-CM | POA: Diagnosis not present

## 2021-04-08 ENCOUNTER — Telehealth: Payer: Self-pay | Admitting: *Deleted

## 2021-04-08 NOTE — Telephone Encounter (Signed)
PROLIA GIVEN 11/27/2020 NEXT INJECTION 05/30/2021

## 2021-04-08 NOTE — Telephone Encounter (Signed)
Prolia insurance verification has been sent awaiting Summary of benefits  

## 2021-04-15 ENCOUNTER — Inpatient Hospital Stay: Payer: PPO | Admitting: Hematology & Oncology

## 2021-04-15 ENCOUNTER — Telehealth: Payer: Self-pay | Admitting: *Deleted

## 2021-04-15 ENCOUNTER — Other Ambulatory Visit: Payer: Self-pay | Admitting: Cardiology

## 2021-04-15 ENCOUNTER — Inpatient Hospital Stay: Payer: PPO | Attending: Hematology & Oncology

## 2021-04-15 ENCOUNTER — Other Ambulatory Visit: Payer: Self-pay

## 2021-04-15 ENCOUNTER — Encounter: Payer: Self-pay | Admitting: Hematology & Oncology

## 2021-04-15 VITALS — BP 151/62 | HR 49 | Temp 98.4°F | Resp 18 | Wt 133.8 lb

## 2021-04-15 DIAGNOSIS — I82402 Acute embolism and thrombosis of unspecified deep veins of left lower extremity: Secondary | ICD-10-CM | POA: Insufficient documentation

## 2021-04-15 DIAGNOSIS — D6862 Lupus anticoagulant syndrome: Secondary | ICD-10-CM | POA: Diagnosis not present

## 2021-04-15 DIAGNOSIS — Z79899 Other long term (current) drug therapy: Secondary | ICD-10-CM | POA: Insufficient documentation

## 2021-04-15 DIAGNOSIS — I82401 Acute embolism and thrombosis of unspecified deep veins of right lower extremity: Secondary | ICD-10-CM

## 2021-04-15 DIAGNOSIS — Z7901 Long term (current) use of anticoagulants: Secondary | ICD-10-CM | POA: Diagnosis not present

## 2021-04-15 LAB — CBC WITH DIFFERENTIAL (CANCER CENTER ONLY)
Abs Immature Granulocytes: 0.06 10*3/uL (ref 0.00–0.07)
Basophils Absolute: 0.1 10*3/uL (ref 0.0–0.1)
Basophils Relative: 1 %
Eosinophils Absolute: 0.1 10*3/uL (ref 0.0–0.5)
Eosinophils Relative: 2 %
HCT: 42.2 % (ref 36.0–46.0)
Hemoglobin: 13.8 g/dL (ref 12.0–15.0)
Immature Granulocytes: 1 %
Lymphocytes Relative: 22 %
Lymphs Abs: 1.9 10*3/uL (ref 0.7–4.0)
MCH: 31 pg (ref 26.0–34.0)
MCHC: 32.7 g/dL (ref 30.0–36.0)
MCV: 94.8 fL (ref 80.0–100.0)
Monocytes Absolute: 0.7 10*3/uL (ref 0.1–1.0)
Monocytes Relative: 8 %
Neutro Abs: 5.6 10*3/uL (ref 1.7–7.7)
Neutrophils Relative %: 66 %
Platelet Count: 196 10*3/uL (ref 150–400)
RBC: 4.45 MIL/uL (ref 3.87–5.11)
RDW: 13.7 % (ref 11.5–15.5)
WBC Count: 8.5 10*3/uL (ref 4.0–10.5)
nRBC: 0 % (ref 0.0–0.2)

## 2021-04-15 LAB — CMP (CANCER CENTER ONLY)
ALT: 13 U/L (ref 0–44)
AST: 17 U/L (ref 15–41)
Albumin: 3.8 g/dL (ref 3.5–5.0)
Alkaline Phosphatase: 78 U/L (ref 38–126)
Anion gap: 7 (ref 5–15)
BUN: 23 mg/dL (ref 8–23)
CO2: 28 mmol/L (ref 22–32)
Calcium: 8.6 mg/dL — ABNORMAL LOW (ref 8.9–10.3)
Chloride: 105 mmol/L (ref 98–111)
Creatinine: 0.75 mg/dL (ref 0.44–1.00)
GFR, Estimated: >60 mL/min (ref >60)
Glucose, Bld: 106 mg/dL — ABNORMAL HIGH (ref 70–99)
Potassium: 3.9 mmol/L (ref 3.5–5.1)
Sodium: 140 mmol/L (ref 135–145)
Total Bilirubin: 0.4 mg/dL (ref 0.3–1.2)
Total Protein: 6.3 g/dL — ABNORMAL LOW (ref 6.5–8.1)

## 2021-04-15 NOTE — Progress Notes (Signed)
Hematology and Oncology Follow Up Visit  Emma Lopez [id number] 1946/06/09 75 y.o. 04/15/2021   Principle Diagnosis:  Recurrent DVT of the left leg -- probable progression Positive lupus anticoagulant  Current Therapy:   Xarelto 20 mg p.o. daily -- d/c on 01/06/2021 Pradaxa 150 mg po BID - start on 01/06/2021 - d/c on 01/25/2021 Coumadin  -- doses by [facility] Clinic -- keep INR 2.5-3.5     Interim History:  Emma Lopez is in for follow-up.  The big news is that she was found to have pneumonia.  She says that she thinks this is bilateral pneumonia.  She had no fever.  She had a cough.  She not sure which antibiotic she was put on.  She says that she is feeling a little bit better.  She still feels quite fatigued.  I must say that her heart rate was recorded as 49.  When I listen to her, I thought her heart rate was closer to 60.  It was regular.  She has had no problems with chest pain.  There is been no obvious shortness of breath.  She has an occasional bout of shortness of breath.  She has been going to her [facility].  Hopefully she will be going back up there soon.  She has had no bleeding.  There is no change in bowel or bladder habits.  There is been no problems with leg pain or swelling.  Her last Doppler was done I think back in July.  It would be nice for [place] to repeat another Doppler when we see her back.  She is being managed by the Coumadin clinic.  As always, they are doing a great job.  Currently, her performance status is ECOG 1.    Medications:  Current Outpatient Medications:    acetaminophen (TYLENOL) 325 MG tablet, Take 650 mg by mouth every 6 (six) hours as needed for moderate pain., Disp: , Rfl:    ALPRAZolam (XANAX) 0.25 MG tablet, Take 1 tablet (0.25 mg total) by mouth every 8 (eight) hours as needed for anxiety., Disp: 30 tablet, Rfl: 0   B Complex Vitamins (B COMPLEX 1 PO), Take 1 each by mouth 2 (two) times daily. sublingual, Disp: , Rfl:     Calcium Carbonate+Vitamin D 600-200 MG-UNIT TABS, Take 1 tablet by mouth daily., Disp: , Rfl:    denosumab (PROLIA) 60 MG/ML SOLN injection, Inject 60 mg into the skin every 6 (six) months. Administer in upper arm, thigh, or abdomen, Disp: 180 mL, Rfl: 2   dicyclomine (BENTYL) 20 MG tablet, Take 20 mg by mouth 3 (three) times daily as needed for spasms., Disp: , Rfl:    escitalopram (LEXAPRO) 20 MG tablet, Take 20 mg by mouth daily., Disp: , Rfl:    Multiple Vitamins-Minerals (MULTIVITAMIN PO), Take 1 tablet by mouth 2 (two) times daily. , Disp: , Rfl:    Peppermint Oil (IBGARD) 90 MG CPCR, See admin instructions., Disp: , Rfl:    rOPINIRole (REQUIP) 1 MG tablet, Take 1 tablet (1 mg total) by mouth at bedtime., Disp: 60 tablet, Rfl: 2   warfarin (COUMADIN) 1 MG tablet, TAKE AS DIRECTED BY THE COUMADIN CLINIC, Disp: 30 tablet, Rfl: 0  Allergies:  Allergies  Allergen Reactions   Other Other (See Comments)   Warfarin Sodium     Other reaction(s): rash (name brand coumadin is ok)   Sulfa Antibiotics Rash and Other (See Comments)    Past Medical History, Surgical history, Social history, and  Family History were reviewed and updated.  Review of Systems: Review of Systems  Constitutional: Negative.   HENT:  Negative.    Eyes: Negative.   Respiratory: Negative.    Cardiovascular: Negative.   Gastrointestinal: Negative.   Endocrine: Negative.   Genitourinary: Negative.    Musculoskeletal: Negative.   Skin: Negative.   Neurological: Negative.   Hematological: Negative.   Psychiatric/Behavioral: Negative.     Physical Exam:  weight is 133 lb 12.8 oz (60.7 kg). Her oral temperature is 98.4 F (36.9 C). Her blood pressure is 151/62 (abnormal) and her pulse is 49 (abnormal). Her respiration is 18 and oxygen saturation is 98%.   Wt Readings from Last 3 Encounters:  04/15/21 133 lb 12.8 oz (60.7 kg)  02/23/21 138 lb (62.6 kg)  01/25/21 137 lb 6.4 oz (62.3 kg)    Physical Exam Vitals  reviewed.  HENT:     Head: Normocephalic and atraumatic.  Eyes:     Pupils: Pupils are equal, round, and reactive to light.  Cardiovascular:     Rate and Rhythm: Normal rate and regular rhythm.     Heart sounds: Normal heart sounds.  Pulmonary:     Effort: Pulmonary effort is normal.     Breath sounds: Normal breath sounds.  Abdominal:     General: Bowel sounds are normal.     Palpations: Abdomen is soft.  Musculoskeletal:        General: No tenderness or deformity. Normal range of motion.     Cervical back: Normal range of motion.     Comments: Her lower extremities show some swelling in the left lower leg.  There is nonpitting edema in the left ankle.  She has good pulses in the distal extremities.  No obvious venous cord is noted in the legs.  Lymphadenopathy:     Cervical: No cervical adenopathy.  Skin:    General: Skin is warm and dry.     Findings: No erythema or rash.  Neurological:     Mental Status: She is alert and oriented to person, place, and time.  Psychiatric:        Behavior: Behavior normal.        Thought Content: Thought content normal.        Judgment: Judgment normal.     Lab Results  Component Value Date   WBC 8.5 04/15/2021   HGB 13.8 04/15/2021   HCT 42.2 04/15/2021   MCV 94.8 04/15/2021   PLT 196 04/15/2021     Chemistry      Component Value Date/Time   NA 140 04/15/2021 0842   NA 142 01/03/2018 1601   K 3.9 04/15/2021 0842   CL 105 04/15/2021 0842   CO2 28 04/15/2021 0842   BUN 23 04/15/2021 0842   BUN 20 01/03/2018 1601   CREATININE 0.75 04/15/2021 0842      Component Value Date/Time   CALCIUM 8.6 (L) 04/15/2021 0842   ALKPHOS 78 04/15/2021 0842   AST 17 04/15/2021 0842   ALT 13 04/15/2021 0842   BILITOT 0.4 04/15/2021 0842      Impression and Plan: Emma Lopez is a very charming 75 year old white female.  She has a history of a DVT.  She was on maintenance Xarelto.  She was on lifelong therapy.    She is doing well on Coumadin.   Her exam really does not show any obvious leg swelling.  I am not sure about the fatigue.  I really do not think this would  be from her having a blood clot in her leg.  Again, her heart rate certainly is not all that high.  I know she does have a cardiologist.  I like to get another Doppler of her left leg.  I think this would be very reasonable.  We get this when we see her back.  I would like to see her back probably after Thanksgiving.   Volanda Napoleon, MD 9/15/20229:52 AM

## 2021-04-15 NOTE — Telephone Encounter (Signed)
Prescription refill request received for warfarin Lov: 02/22/21 Burt Knack)  Next INR check: 04/19/21 Warfarin tablet strength: '1mg'$   Appropriate dose and refill sent to requested pharmacy.

## 2021-04-15 NOTE — Telephone Encounter (Signed)
Per 04/15/21 los gave upcoming appointments - confirmed

## 2021-04-16 ENCOUNTER — Telehealth: Payer: Self-pay | Admitting: *Deleted

## 2021-04-16 DIAGNOSIS — R7309 Other abnormal glucose: Secondary | ICD-10-CM | POA: Diagnosis not present

## 2021-04-16 DIAGNOSIS — E559 Vitamin D deficiency, unspecified: Secondary | ICD-10-CM | POA: Diagnosis not present

## 2021-04-16 DIAGNOSIS — E785 Hyperlipidemia, unspecified: Secondary | ICD-10-CM | POA: Diagnosis not present

## 2021-04-16 DIAGNOSIS — E042 Nontoxic multinodular goiter: Secondary | ICD-10-CM | POA: Diagnosis not present

## 2021-04-16 LAB — LUPUS ANTICOAGULANT PANEL
DRVVT: 100.7 s — ABNORMAL HIGH (ref 0.0–47.0)
PTT Lupus Anticoagulant: 49.4 s (ref 0.0–51.9)

## 2021-04-16 LAB — DRVVT CONFIRM: dRVVT Confirm: 1.5 ratio — ABNORMAL HIGH (ref 0.8–1.2)

## 2021-04-16 LAB — DRVVT MIX: dRVVT Mix: 43.4 s — ABNORMAL HIGH (ref 0.0–40.4)

## 2021-04-16 NOTE — Telephone Encounter (Signed)
Notified pt of lab results and MD recommendations to continue Coumadin. Pt verbalized understanding. No concerns at this time.

## 2021-04-16 NOTE — Telephone Encounter (Signed)
-----   Message from Emma Napoleon, MD sent at 04/16/2021  3:06 PM EDT ----- Please call her and tell her that she does have the circulating lupus anticoagulant.  As such, Coumadin is going to be the best treatment for her.  Thanks.  [name]

## 2021-04-18 LAB — BETA-2-GLYCOPROTEIN I ABS, IGG/M/A
Beta-2 Glyco I IgG: <9 GPI IgG units (ref 0–20)
Beta-2-Glycoprotein I IgA: <9 GPI IgA units (ref 0–25)
Beta-2-Glycoprotein I IgM: <9 GPI IgM units (ref 0–32)

## 2021-04-19 ENCOUNTER — Other Ambulatory Visit: Payer: Self-pay

## 2021-04-19 ENCOUNTER — Ambulatory Visit (INDEPENDENT_AMBULATORY_CARE_PROVIDER_SITE_OTHER): Payer: PPO

## 2021-04-19 DIAGNOSIS — Z5181 Encounter for therapeutic drug level monitoring: Secondary | ICD-10-CM

## 2021-04-19 LAB — POCT INR: INR: 4 — AB (ref 2.0–3.0)

## 2021-04-19 NOTE — Patient Instructions (Signed)
Description   Hold today's dose and then continue on same dosage 1 tablet daily except for 1/2 tablet on Tuesdays, Thursdays and Sundays.  Recheck INR in 3 weeks. [facility] [phone].

## 2021-04-20 DIAGNOSIS — F419 Anxiety disorder, unspecified: Secondary | ICD-10-CM | POA: Diagnosis not present

## 2021-04-20 DIAGNOSIS — Z87891 Personal history of nicotine dependence: Secondary | ICD-10-CM | POA: Diagnosis not present

## 2021-04-20 DIAGNOSIS — I700 Atherosclerosis of aorta: Secondary | ICD-10-CM | POA: Diagnosis not present

## 2021-04-20 DIAGNOSIS — M810 Age-related osteoporosis without current pathological fracture: Secondary | ICD-10-CM | POA: Diagnosis not present

## 2021-04-20 DIAGNOSIS — R946 Abnormal results of thyroid function studies: Secondary | ICD-10-CM | POA: Diagnosis not present

## 2021-04-20 DIAGNOSIS — F339 Major depressive disorder, recurrent, unspecified: Secondary | ICD-10-CM | POA: Diagnosis not present

## 2021-04-20 DIAGNOSIS — Z1339 Encounter for screening examination for other mental health and behavioral disorders: Secondary | ICD-10-CM | POA: Diagnosis not present

## 2021-04-20 DIAGNOSIS — E785 Hyperlipidemia, unspecified: Secondary | ICD-10-CM | POA: Diagnosis not present

## 2021-04-20 DIAGNOSIS — Z0000 Encounter for general adult medical examination without abnormal findings: Secondary | ICD-10-CM | POA: Diagnosis not present

## 2021-04-20 DIAGNOSIS — Z86718 Personal history of other venous thrombosis and embolism: Secondary | ICD-10-CM | POA: Diagnosis not present

## 2021-04-20 DIAGNOSIS — Z1331 Encounter for screening for depression: Secondary | ICD-10-CM | POA: Diagnosis not present

## 2021-04-20 DIAGNOSIS — M797 Fibromyalgia: Secondary | ICD-10-CM | POA: Diagnosis not present

## 2021-04-22 LAB — CARDIOLIPIN ANTIBODIES, IGG, IGM, IGA
Anticardiolipin IgA: <9 APL U/mL (ref 0–11)
Anticardiolipin IgG: <9 GPL U/mL (ref 0–14)
Anticardiolipin IgM: <9 MPL U/mL (ref 0–12)

## 2021-04-28 NOTE — Telephone Encounter (Addendum)
Deductible n/a  OOP MAX $3450 ($2239.27 met)  Annual exam 08/04/2020 JK  Calcium 9.3            Date 01/25/2021  Upcoming dental procedures NO  Prior Authorization needed No per rep [id number]   Pt estimated Cost $222  Courtesy call 05/20/2021 per pt (pt will call me back as well)  APPT 06/02/2021   Coverage Details: 20% one dose,20% admin fee  

## 2021-05-13 ENCOUNTER — Ambulatory Visit (INDEPENDENT_AMBULATORY_CARE_PROVIDER_SITE_OTHER): Payer: PPO | Admitting: *Deleted

## 2021-05-13 ENCOUNTER — Other Ambulatory Visit: Payer: Self-pay

## 2021-05-13 DIAGNOSIS — I82401 Acute embolism and thrombosis of unspecified deep veins of right lower extremity: Secondary | ICD-10-CM | POA: Diagnosis not present

## 2021-05-13 DIAGNOSIS — Z5181 Encounter for therapeutic drug level monitoring: Secondary | ICD-10-CM

## 2021-05-13 LAB — POCT INR: INR: 2 (ref 2.0–3.0)

## 2021-05-13 NOTE — Patient Instructions (Signed)
Description   Today take 1 tablet and tomorrow take 1.5 tablets then continue taking 1 tablet daily except for 1/2 tablet on Tuesdays, Thursdays and Sundays.  Recheck INR in 3 weeks. [facility] [phone].

## 2021-05-17 DIAGNOSIS — M180 Bilateral primary osteoarthritis of first carpometacarpal joints: Secondary | ICD-10-CM | POA: Diagnosis not present

## 2021-05-17 DIAGNOSIS — G2581 Restless legs syndrome: Secondary | ICD-10-CM | POA: Diagnosis not present

## 2021-05-17 DIAGNOSIS — M79641 Pain in right hand: Secondary | ICD-10-CM | POA: Diagnosis not present

## 2021-05-17 DIAGNOSIS — M1812 Unilateral primary osteoarthritis of first carpometacarpal joint, left hand: Secondary | ICD-10-CM | POA: Diagnosis not present

## 2021-05-18 ENCOUNTER — Encounter: Payer: Self-pay | Admitting: Family Medicine

## 2021-05-18 ENCOUNTER — Ambulatory Visit: Payer: PPO | Admitting: Family Medicine

## 2021-05-18 VITALS — BP 132/59 | HR 53 | Ht 62.0 in | Wt 135.8 lb

## 2021-05-18 DIAGNOSIS — G2581 Restless legs syndrome: Secondary | ICD-10-CM

## 2021-05-18 MED ORDER — ROPINIROLE HCL ER 2 MG PO TB24: 2.0000 mg | ORAL_TABLET | Freq: Every day | ORAL | 3 refills | Status: DC

## 2021-05-18 NOTE — Patient Instructions (Signed)
Below is our plan:  We will continue ropinirole 1mg  at bedtime. I will add ropinirole extended release 2mg  early morning or afternoon. You can play around with timing but only take once daily.   Please make sure you are staying well hydrated. I recommend 50-60 ounces daily. Well balanced diet and regular exercise encouraged. Consistent sleep schedule with 6-8 hours recommended.   Please continue follow up with care team as directed.   Follow up with [name] in 6 months   You may receive a survey regarding today's visit. I encourage you to leave honest feed back as I do use this information to improve patient care. Thank you for seeing me today!

## 2021-05-18 NOTE — Progress Notes (Signed)
Chief Complaint  Patient presents with   Follow-up    Rm 2, alone. Here for worsening in RLS. Pt reports no problem during the nights.      HISTORY OF PRESENT ILLNESS:  05/18/21 ALL:  Emma Lopez is a 75 y.o. female here today for follow up for RLS. She continues ropinirole 1mg  daily at bedtime. PCP has been filling medications. She sent a message asking [name] if she could increase dose. She tried 1.5mg  at bedtime but states that this was not helpful. She reports that she gets up around 9am. She eats breakfast and then is so sleepy she can't keep her eyes open. She wants to take a nap but reports anytime she is in a horizontal position she has significant "creepy crawling" sensations of her legs. She usually takes ropinirole around 9pm. She usually goes to bed around 10pm. She does not have difficulty with RLS at bedtime.   HISTORY (copied from [name]'s previous note)  Emma Lopez is a 75 y.o. year old White or Caucasian female patient seen here as a referral on 12/04/2019 from Dr. [name], MD  Chief concern according to patient : My RLS/ PLMs may cause me to not rest well, I am tired all day.    I have the pleasure of seeing Emma Lopez today, a right-handed White or Caucasian female with a fatigue disorder.  She  has a past medical history of Abnormal EKG, Anxiety, Arthritis, Bradycardia (12/14/2017), CIN III (cervical intraepithelial neoplasia III) (7846), Complication of anesthesia, PLMs,  Nocturia, fatigue- Depression, DES exposure in utero, DVT (deep venous thrombosis) ([place]) (01/2009), Dyspnea on exertion (10/2017), Elevated triglycerides with high cholesterol, Endometriosis,GERD (gastroesophageal reflux disease), History of colon polyps, History of hiatal hernia, IBS (irritable bowel syndrome) (2015), Multinodular goiter, Osteoporosis (12/2017), PE (pulmonary embolism- 2010- after I had last seen her), PONV (postoperative nausea and vomiting), Restless legs syndrome,PLM  sleep disorder, Thyroid disease, and Vitamin D deficiency..   The patient had the first and last sleep study in the year 2003  with a result of PLM disorder - at [facility].   Sleep relevant medical history: Nocturia/ 3-4 ,DVT and PE in 2010 , LBBB diagnosed last year, Dr Golden Hurter- PVC on EKG, bradycardia.   Social history:  Patient is retired  2003 from [employer], and lives in a household alone.  Family status is single, no children.  Pets are present-  a cat.  Tobacco use was an occasional smoker  .   ETOH use- seldomly , Caffeine intake in form of1-2 cups of hot tea.no energy drinks. Regular exercise in form of walking  Hobbies : yoga  Sleep habits are as follows:  The patient's dinner time is between 6-7  PM. The patient goes to bed at 10 PM and continues to sleep for 2 hours, wakes for many  bathroom breaks.The preferred sleep position is mainly on her sides, with the support of 1 pillow. Dreams are reportedly frequently. 9 AM is the usual rise time. The patient wakes up spontaneously at 7.30. She reports  feeling refreshed and restored in AM, without symptoms such as dry mouth , morning headaches and only sometimes residual fatigue. Naps are taken infrequently, she wants to nap but RLS prevents her.    REVIEW OF SYSTEMS: Out of a complete 14 system review of symptoms, the patient complains only of the following symptoms, fatigue, restless legs,brain fog and all other reviewed systems are negative.  ESS: 3  ALLERGIES: Allergies  Allergen Reactions   Other Other (See Comments)   Warfarin Sodium     Other reaction(s): rash (name brand coumadin is ok)   Sulfa Antibiotics Rash and Other (See Comments)     HOME MEDICATIONS: Outpatient Medications Prior to Visit  Medication Sig Dispense Refill   acetaminophen (TYLENOL) 325 MG tablet Take 650 mg by mouth every 6 (six) hours as needed for moderate pain.     ALPRAZolam (XANAX) 0.25 MG tablet Take 1 tablet (0.25 mg total)  by mouth every 8 (eight) hours as needed for anxiety. 30 tablet 0   B Complex Vitamins (B COMPLEX 1 PO) Take 1 each by mouth 2 (two) times daily. sublingual     Calcium Carbonate+Vitamin D 600-200 MG-UNIT TABS Take 1 tablet by mouth daily.     denosumab (PROLIA) 60 MG/ML SOLN injection Inject 60 mg into the skin every 6 (six) months. Administer in upper arm, thigh, or abdomen 180 mL 2   dicyclomine (BENTYL) 20 MG tablet Take 20 mg by mouth 3 (three) times daily as needed for spasms.     escitalopram (LEXAPRO) 20 MG tablet Take 20 mg by mouth daily.     Multiple Vitamins-Minerals (MULTIVITAMIN PO) Take 1 tablet by mouth 2 (two) times daily.      PEPPERMINT OIL PO Take by mouth.     rOPINIRole (REQUIP) 1 MG tablet Take 1 tablet (1 mg total) by mouth at bedtime. 60 tablet 2   warfarin (COUMADIN) 1 MG tablet TAKE AS DIRECTED BY THE COUMADIN CLINIC 30 tablet 3   Peppermint Oil (IBGARD) 90 MG CPCR See admin instructions.     No facility-administered medications prior to visit.     PAST MEDICAL HISTORY: Past Medical History:  Diagnosis Date   Abnormal EKG    Anxiety    Arthritis    oa   Bradycardia 12/14/2017   CIN III (cervical intraepithelial neoplasia III) 2774   Complication of anesthesia    did well last 2 times with procedures   Depression    DES exposure in utero    DVT (deep venous thrombosis) ([place]) 01/2009   LEFT LEG   Dyspnea on exertion 10/2017   Elevated triglycerides with high cholesterol    Endometriosis    Fibromyalgia    GERD (gastroesophageal reflux disease)    History of colon polyps    History of hiatal hernia    told by some md has, some say not   IBS (irritable bowel syndrome) 2015   Multinodular goiter    Osteoporosis 12/2017   T score -3.2 stable from prior study   PE (pulmonary embolism)    PONV (postoperative nausea and vomiting)    Restless legs syndrome    Sleep disorder    Thyroid disease    HYPERTHYROIDISM   Vitamin D deficiency      PAST  SURGICAL HISTORY: Past Surgical History:  Procedure Laterality Date   ABDOMINAL HYSTERECTOMY  2001   TAH, partial   APPENDECTOMY  1978   COLONOSCOPY WITH PROPOFOL N/A 12/07/2015   Procedure: COLONOSCOPY WITH PROPOFOL;  Surgeon: Garlan Fair, MD;  Location: [facility];  Service: Endoscopy;  Laterality: N/A;   colonscopy  7 yrs ago   other in past   ESOPHAGOGASTRODUODENOSCOPY (EGD) WITH PROPOFOL N/A 12/07/2015   Procedure: ESOPHAGOGASTRODUODENOSCOPY (EGD) WITH PROPOFOL;  Surgeon: Garlan Fair, MD;  Location: [facility];  Service: Endoscopy;  Laterality: N/A;   ESOPHAGOGASTRODUODENOSCOPY ENDOSCOPY  yrs ago   FACIAL COSMETIC  SURGERY     GYNECOLOGIC CRYOSURGERY     MYOMECTOMY     TONSILLECTOMY       FAMILY HISTORY: Family History  Problem Relation Age of Onset   Hypertension Mother    Breast cancer Mother        22's   Cancer Father        COLON   Hypertension Sister    Stroke Sister    Breast cancer Maternal Aunt        Age 12's   Cancer Maternal Aunt        Melanoma   Alzheimer's disease Maternal Aunt    Cancer Paternal Aunt        OVARIAN and COLON   Breast cancer Maternal Aunt        70's   Cancer Maternal Aunt        Colon CA   Alzheimer's disease Maternal Aunt      SOCIAL HISTORY: Social History   Socioeconomic History   Marital status: Single    Spouse name: Not on file   Number of children: Not on file   Years of education: Not on file   Highest education level: Not on file  Occupational History   Not on file  Tobacco Use   Smoking status: Former    Packs/day: 0.10    Years: 6.00    Pack years: 0.60    Types: Cigarettes    Quit date: 08/01/1978    Years since quitting: 42.8   Smokeless tobacco: Never   Tobacco comments:    only smoked 4-6 yrs 1 pack per month  Vaping Use   Vaping Use: Never used  Substance and Sexual Activity   Alcohol use: Not Currently    Alcohol/week: 0.0 standard drinks   Drug use: No   Sexual activity: Not  Currently    Birth control/protection: Surgical    Comment: HYSTERECTOMY-1st intercourse 27-Fewer than 5 partners  Other Topics Concern   Not on file  Social History Narrative   Not on file   Social Determinants of Health   Financial Resource Strain: Not on file  Food Insecurity: Not on file  Transportation Needs: Not on file  Physical Activity: Not on file  Stress: Not on file  Social Connections: Not on file  Intimate Partner Violence: Not on file     PHYSICAL EXAM  Vitals:   05/18/21 1324  BP: (!) 132/59  Pulse: (!) 53  Weight: 135 lb 12.8 oz (61.6 kg)  Height: 5\' 2"  (1.575 m)   Body mass index is 24.84 kg/m.  Generalized: Well developed, in no acute distress  Cardiology: normal rate and rhythm, no murmur auscultated  Respiratory: clear to auscultation bilaterally    Neurological examination  Mentation: Alert oriented to time, place, history taking. Follows all commands speech and language fluent Cranial nerve II-XII: Pupils were equal round reactive to light. Extraocular movements were full, visual field were full on confrontational test. Facial sensation and strength were normal. Head turning and shoulder shrug  were normal and symmetric. Motor: The motor testing reveals 5 over 5 strength of all 4 extremities. Good symmetric motor tone is noted throughout.  Sensory: Sensory testing is intact to soft touch on all 4 extremities. No evidence of extinction is noted.  Coordination: Cerebellar testing reveals good finger-nose-finger and heel-to-shin bilaterally.  Gait and station: Gait is normal.  Reflexes: Deep tendon reflexes are symmetric and normal bilaterally.    DIAGNOSTIC DATA (LABS, IMAGING, TESTING) - I reviewed  patient records, labs, notes, testing and imaging myself where available.  Lab Results  Component Value Date   WBC 8.5 04/15/2021   HGB 13.8 04/15/2021   HCT 42.2 04/15/2021   MCV 94.8 04/15/2021   PLT 196 04/15/2021      Component Value  Date/Time   NA 140 04/15/2021 0842   NA 142 01/03/2018 1601   K 3.9 04/15/2021 0842   CL 105 04/15/2021 0842   CO2 28 04/15/2021 0842   GLUCOSE 106 (H) 04/15/2021 0842   BUN 23 04/15/2021 0842   BUN 20 01/03/2018 1601   CREATININE 0.75 04/15/2021 0842   CALCIUM 8.6 (L) 04/15/2021 0842   PROT 6.3 (L) 04/15/2021 0842   ALBUMIN 3.8 04/15/2021 0842   AST 17 04/15/2021 0842   ALT 13 04/15/2021 0842   ALKPHOS 78 04/15/2021 0842   BILITOT 0.4 04/15/2021 0842   GFRNONAA >60 04/15/2021 0842   GFRAA 103 01/03/2018 1601   No results found for: CHOL, HDL, LDLCALC, LDLDIRECT, TRIG, CHOLHDL No results found for: HGBA1C No results found for: VITAMINB12 Lab Results  Component Value Date   TSH 0.411 11/17/2020    No flowsheet data found.   No flowsheet data found.   ASSESSMENT AND PLAN  75 y.o. year old female  has a past medical history of Abnormal EKG, Anxiety, Arthritis, Bradycardia (12/14/2017), CIN III (cervical intraepithelial neoplasia III) (9024), Complication of anesthesia, Depression, DES exposure in utero, DVT (deep venous thrombosis) ([place]) (01/2009), Dyspnea on exertion (10/2017), Elevated triglycerides with high cholesterol, Endometriosis, Fibromyalgia, GERD (gastroesophageal reflux disease), History of colon polyps, History of hiatal hernia, IBS (irritable bowel syndrome) (2015), Multinodular goiter, Osteoporosis (12/2017), PE (pulmonary embolism), PONV (postoperative nausea and vomiting), Restless legs syndrome, Sleep disorder, Thyroid disease, and Vitamin D deficiency. here with    RLS (restless legs syndrome)  Anniemae reports that RLS symptoms are worsening. She is doing well with ropinirole 1mg  at bedtime but reports significant symptoms that occur during the day. She does not feel she can take ropinirole IR during the day due to sedative side effects. We will try ropinirole XR 2mg  during the day and continue ropinirole 1mg  at bedtime. She will call me if unable to tolerate. We  may consider switch to pramipexole to see if this is better tolerated. She was encouraged to work on increasing physical activity to help combat fatigue. Healthy low car diet could be helpful. She will follow up in 6 months.    No orders of the defined types were placed in this encounter.    Meds ordered this encounter  Medications   rOPINIRole (REQUIP XL) 2 MG 24 hr tablet    Sig: Take 1 tablet (2 mg total) by mouth at bedtime.    Dispense:  90 tablet    Refill:  3    Order Specific Question:   Supervising Provider    Answer:   Melvenia Beam [[id number]]       Debbora Presto, MSN, FNP-C 05/18/2021, 3:57 PM  [facility] [address], [place], [place] [address] [phone]

## 2021-05-19 DIAGNOSIS — M1811 Unilateral primary osteoarthritis of first carpometacarpal joint, right hand: Secondary | ICD-10-CM | POA: Diagnosis not present

## 2021-05-19 DIAGNOSIS — M180 Bilateral primary osteoarthritis of first carpometacarpal joints: Secondary | ICD-10-CM | POA: Diagnosis not present

## 2021-05-31 ENCOUNTER — Ambulatory Visit: Payer: PPO | Admitting: Physician Assistant

## 2021-06-02 ENCOUNTER — Ambulatory Visit (INDEPENDENT_AMBULATORY_CARE_PROVIDER_SITE_OTHER): Payer: PPO | Admitting: Gynecology

## 2021-06-02 ENCOUNTER — Other Ambulatory Visit: Payer: Self-pay

## 2021-06-02 ENCOUNTER — Telehealth: Payer: Self-pay | Admitting: *Deleted

## 2021-06-02 DIAGNOSIS — M810 Age-related osteoporosis without current pathological fracture: Secondary | ICD-10-CM

## 2021-06-02 MED ORDER — DENOSUMAB 60 MG/ML ~~LOC~~ SOSY
60.0000 mg | PREFILLED_SYRINGE | Freq: Once | SUBCUTANEOUS | Status: AC
  Administered 2021-06-02: 60 mg via SUBCUTANEOUS

## 2021-06-02 NOTE — Telephone Encounter (Signed)
   Pre-operative Risk Assessment    Patient Name: Emma Lopez  DOB: 04-Dec-1945 MRN: [id number]      Request for Surgical Clearance   Procedure:   RIGHT THUMB CARPOMETACARPAL ARTHROPLASTY  Date of Surgery: Clearance TBD                                 Surgeon:  DR. Roseanne Kaufman Surgeon's Group or Practice Name:  [facility] Phone number:  [phone] Fax number:  [phone] ATTN: Orson Slick   Type of Clearance Requested: - Medical  - Pharmacy:  Hold Warfarin (Coumadin)     Type of Anesthesia:   CHOICE   Additional requests/questions:   Jiles Prows   06/02/2021, 5:30 PM

## 2021-06-03 ENCOUNTER — Ambulatory Visit (INDEPENDENT_AMBULATORY_CARE_PROVIDER_SITE_OTHER): Payer: PPO

## 2021-06-03 DIAGNOSIS — Z5181 Encounter for therapeutic drug level monitoring: Secondary | ICD-10-CM | POA: Diagnosis not present

## 2021-06-03 LAB — POCT INR: INR: 4 — AB (ref 2.0–3.0)

## 2021-06-03 NOTE — Telephone Encounter (Signed)
   Name: Emma Lopez  DOB: 10-Dec-1945  MRN: [id number]   Primary Cardiologist: Fransico Him, MD  Chart reviewed as part of pre-operative protocol coverage. Patient was contacted 06/03/2021 in reference to pre-operative risk assessment for pending surgery as outlined below.  Emma Lopez was last seen on 12/15/20 by Dr. Radford Pax.  Since that day, Emma Lopez has done well. CT coronary in 2019 negative for CAD. She can complete 4.0 METS without angina (3 flights of stairs).   Will await pharm instructions for coumadin. Copy Dr. Marin Olp.  Therefore, based on ACC/AHA guidelines, the patient would be at acceptable risk for the planned procedure without further cardiovascular testing.   The patient was advised that if she develops new symptoms prior to surgery to contact our office to arrange for a follow-up visit, and she verbalized understanding.  I will route this recommendation to the requesting party via Epic fax function and remove from pre-op pool. Please call with questions.  Garrett, PA 06/03/2021, 10:08 AM

## 2021-06-03 NOTE — Patient Instructions (Signed)
Description   Hold today's dose and then continue taking 1 tablet daily except for 1/2 tablet on Tuesdays, Thursdays and Sundays.  Recheck INR in 3 weeks. [facility]in Clinic [phone].

## 2021-06-07 NOTE — Telephone Encounter (Signed)
Patient with diagnosis of recurrent DVT, DVT on DOAC therapy, and positive lupus anticoagulant, now on warfarin for anticoagulation with higher INR range 2.5-3.5. Most recent DVT noted 01/06/21, was resolving on 02/12/21 ultrasound.  Procedure: RIGHT THUMB CARPOMETACARPAL ARTHROPLASTY Date of procedure: TBD  CrCl 55mL/min Platelet count 196K  Per office protocol, patient can hold warfarin for 5 days prior to procedure. Patient will need bridging with Lovenox (enoxaparin) around procedure. Please have pt call Coumadin clinic when she has procedure date set as bridge will be coordinated by [facility] where pt is followed.

## 2021-06-10 ENCOUNTER — Encounter: Payer: Self-pay | Admitting: Hematology & Oncology

## 2021-06-10 ENCOUNTER — Telehealth: Payer: Self-pay

## 2021-06-10 ENCOUNTER — Other Ambulatory Visit: Payer: Self-pay

## 2021-06-10 ENCOUNTER — Other Ambulatory Visit: Payer: PPO

## 2021-06-10 ENCOUNTER — Ambulatory Visit (HOSPITAL_BASED_OUTPATIENT_CLINIC_OR_DEPARTMENT_OTHER)
Admission: RE | Admit: 2021-06-10 | Discharge: 2021-06-10 | Disposition: A | Discharge status: Home / Self Care | Payer: PPO | Source: Ambulatory Visit | Attending: Hematology & Oncology | Admitting: Hematology & Oncology

## 2021-06-10 ENCOUNTER — Inpatient Hospital Stay: Payer: PPO | Attending: Hematology & Oncology | Admitting: Hematology & Oncology

## 2021-06-10 ENCOUNTER — Inpatient Hospital Stay: Payer: PPO

## 2021-06-10 VITALS — BP 149/54 | HR 64 | Temp 98.1°F | Resp 18 | Wt 137.2 lb

## 2021-06-10 DIAGNOSIS — I82402 Acute embolism and thrombosis of unspecified deep veins of left lower extremity: Secondary | ICD-10-CM | POA: Diagnosis not present

## 2021-06-10 DIAGNOSIS — I82432 Acute embolism and thrombosis of left popliteal vein: Secondary | ICD-10-CM | POA: Diagnosis not present

## 2021-06-10 LAB — CMP (CANCER CENTER ONLY)
ALT: 13 U/L (ref 0–44)
AST: 17 U/L (ref 15–41)
Albumin: 3.8 g/dL (ref 3.5–5.0)
Alkaline Phosphatase: 80 U/L (ref 38–126)
Anion gap: 8 (ref 5–15)
BUN: 17 mg/dL (ref 8–23)
CO2: 28 mmol/L (ref 22–32)
Calcium: 8.9 mg/dL (ref 8.9–10.3)
Chloride: 105 mmol/L (ref 98–111)
Creatinine: 0.76 mg/dL (ref 0.44–1.00)
GFR, Estimated: >60 mL/min (ref >60)
Glucose, Bld: 98 mg/dL (ref 70–99)
Potassium: 4.1 mmol/L (ref 3.5–5.1)
Sodium: 141 mmol/L (ref 135–145)
Total Bilirubin: 0.3 mg/dL (ref 0.3–1.2)
Total Protein: 6.2 g/dL — ABNORMAL LOW (ref 6.5–8.1)

## 2021-06-10 LAB — CBC WITH DIFFERENTIAL (CANCER CENTER ONLY)
Abs Immature Granulocytes: 0.05 10*3/uL (ref 0.00–0.07)
Basophils Absolute: 0 10*3/uL (ref 0.0–0.1)
Basophils Relative: 1 %
Eosinophils Absolute: 0.2 10*3/uL (ref 0.0–0.5)
Eosinophils Relative: 3 %
HCT: 42.1 % (ref 36.0–46.0)
Hemoglobin: 13.5 g/dL (ref 12.0–15.0)
Immature Granulocytes: 1 %
Lymphocytes Relative: 19 %
Lymphs Abs: 1.3 10*3/uL (ref 0.7–4.0)
MCH: 30.5 pg (ref 26.0–34.0)
MCHC: 32.1 g/dL (ref 30.0–36.0)
MCV: 95 fL (ref 80.0–100.0)
Monocytes Absolute: 0.6 10*3/uL (ref 0.1–1.0)
Monocytes Relative: 8 %
Neutro Abs: 4.8 10*3/uL (ref 1.7–7.7)
Neutrophils Relative %: 68 %
Platelet Count: 234 10*3/uL (ref 150–400)
RBC: 4.43 MIL/uL (ref 3.87–5.11)
RDW: 14.6 % (ref 11.5–15.5)
WBC Count: 7 10*3/uL (ref 4.0–10.5)
nRBC: 0 % (ref 0.0–0.2)

## 2021-06-10 NOTE — Telephone Encounter (Signed)
-----   Message from Volanda Napoleon, MD sent at 06/10/2021  2:08 PM EST ----- Please call her and tell her that the blood clot has opened up.  There is still chronic blood clot there.  I think she will always have this.  Again, she is getting blood flow out of her leg.  I will plan to get her back to see [place] in 3 months.  Thanks.  [name]

## 2021-06-10 NOTE — Progress Notes (Signed)
Hematology and Oncology Follow Up Visit  Emma Lopez [id number] [birth year] 75 y.o. 06/10/2021   Principle Diagnosis:  Recurrent DVT of the left leg -- probable progression Positive lupus anticoagulant  Current Therapy:   Xarelto 20 mg p.o. daily -- d/c on 01/06/2021 Pradaxa 150 mg po BID - start on 01/06/2021 - d/c on 01/25/2021 Coumadin  -- doses by Coumadin Clinic -- keep INR 2.5-3.5     Interim History:  Emma Lopez is in for follow-up.  She is doing okay.  [place] 1 [place] before [place]  She does have a little bit of achiness in her legs.  This appears to be more chronic.  When we last saw her back in September, she is did have the lupus anticoagulant.  As such, I had to believe that this is going to be a pathologic event.  We did go ahead and do a Doppler of her left leg today.  This showed recanalization of the venous system.  She has chronic thrombus which she will always have.  There is nothing new.  There is no acute thrombus.  She has had no problems with cough or shortness of breath.  She has had no change in bowel bladder habits.  Is no rashes.  The Coumadin Clinic is doing a great job monitoring her INR.  She said her last INR was 4.0.  Currently, I would have to say that her performance status is ECOG 1.   Medications:  Current Outpatient Medications:    B Complex Vitamins (B COMPLEX 1 PO), Take 1 each by mouth 2 (two) times daily. sublingual, Disp: , Rfl:    Calcium Carbonate+Vitamin D 600-200 MG-UNIT TABS, Take 1 tablet by mouth daily., Disp: , Rfl:    denosumab (PROLIA) 60 MG/ML SOLN injection, Inject 60 mg into the skin every 6 (six) months. Administer in upper arm, thigh, or abdomen, Disp: 180 mL, Rfl: 2   escitalopram (LEXAPRO) 20 MG tablet, Take 20 mg by mouth daily., Disp: , Rfl:    Multiple Vitamins-Minerals (MULTIVITAMIN PO), Take 1 tablet by mouth 2 (two) times daily. , Disp: , Rfl:     rOPINIRole (REQUIP) 1 MG tablet, Take 1 tablet (1 mg total) by mouth at bedtime., Disp: 60 tablet, Rfl: 2   warfarin (COUMADIN) 1 MG tablet, TAKE AS DIRECTED BY THE COUMADIN CLINIC, Disp: 30 tablet, Rfl: 3   acetaminophen (TYLENOL) 325 MG tablet, Take 650 mg by mouth every 6 (six) hours as needed for moderate pain. (Patient not taking: Reported on 06/10/2021), Disp: , Rfl:    ALPRAZolam (XANAX) 0.25 MG tablet, Take 1 tablet (0.25 mg total) by mouth every 8 (eight) hours as needed for anxiety. (Patient not taking: Reported on 06/10/2021), Disp: 30 tablet, Rfl: 0   dicyclomine (BENTYL) 20 MG tablet, Take 20 mg by mouth 3 (three) times daily as needed for spasms. (Patient not taking: Reported on 06/10/2021), Disp: , Rfl:    PEPPERMINT OIL PO, Take by mouth. (Patient not taking: Reported on 06/10/2021), Disp: , Rfl:    rOPINIRole (REQUIP XL) 2 MG 24 hr tablet, Take 1 tablet (2 mg total) by mouth at bedtime., Disp: 90 tablet, Rfl: 3  Allergies:  Allergies  Allergen Reactions   Other Other (See Comments)   Warfarin Sodium     Other reaction(s): rash (name brand coumadin is ok)   Sulfa Antibiotics Rash and Other (See Comments)    Past Medical  History, Surgical history, Social history, and Family History were reviewed and updated.  Review of Systems: Review of Systems  Constitutional: Negative.   HENT:  Negative.    Eyes: Negative.   Respiratory: Negative.    Cardiovascular: Negative.   Gastrointestinal: Negative.   Endocrine: Negative.   Genitourinary: Negative.    Musculoskeletal: Negative.   Skin: Negative.   Neurological: Negative.   Hematological: Negative.   Psychiatric/Behavioral: Negative.     Physical Exam:  weight is 137 lb 4 oz (62.3 kg). Her oral temperature is 98.1 F (36.7 C). Her blood pressure is 149/54 (abnormal) and her pulse is 64. Her respiration is 18 and oxygen saturation is 96%.   Wt Readings from Last 3 Encounters:  06/10/21 137 lb 4 oz (62.3 kg)  05/18/21 135  lb 12.8 oz (61.6 kg)  04/15/21 133 lb 12.8 oz (60.7 kg)    Physical Exam Vitals reviewed.  HENT:     Head: Normocephalic and atraumatic.  Eyes:     Pupils: Pupils are equal, round, and reactive to light.  Cardiovascular:     Rate and Rhythm: Normal rate and regular rhythm.     Heart sounds: Normal heart sounds.  Pulmonary:     Effort: Pulmonary effort is normal.     Breath sounds: Normal breath sounds.  Abdominal:     General: Bowel sounds are normal.     Palpations: Abdomen is soft.  Musculoskeletal:        General: No tenderness or deformity. Normal range of motion.     Cervical back: Normal range of motion.     Comments: Her lower extremities show some swelling in the left lower leg.  There is nonpitting edema in the left ankle.  She has good pulses in the distal extremities.  No obvious venous cord is noted in the legs.  Lymphadenopathy:     Cervical: No cervical adenopathy.  Skin:    General: Skin is warm and dry.     Findings: No erythema or rash.  Neurological:     Mental Status: She is alert and oriented to person, place, and time.  Psychiatric:        Behavior: Behavior normal.        Thought Content: Thought content normal.        Judgment: Judgment normal.     Lab Results  Component Value Date   WBC 7.0 06/10/2021   HGB 13.5 06/10/2021   HCT 42.1 06/10/2021   MCV 95.0 06/10/2021   PLT 234 06/10/2021     Chemistry      Component Value Date/Time   NA 140 04/15/2021 0842   NA 142 01/03/2018 1601   K 3.9 04/15/2021 0842   CL 105 04/15/2021 0842   CO2 28 04/15/2021 0842   BUN 23 04/15/2021 0842   BUN 20 01/03/2018 1601   CREATININE 0.75 04/15/2021 0842      Component Value Date/Time   CALCIUM 8.6 (L) 04/15/2021 0842   ALKPHOS 78 04/15/2021 0842   AST 17 04/15/2021 0842   ALT 13 04/15/2021 0842   BILITOT 0.4 04/15/2021 0842      Impression and Plan: Emma Lopez is a very charming 75 year old white female.  She has a history of a DVT.  She was on  maintenance Xarelto.  She was on lifelong therapy.  Subsequently had a acute thrombus in the left leg.  She does have the lupus anticoagulant which I believe is pathologic.  She will continue and be on  lifelong Coumadin.  I feel confident about the Doppler.  Again it would not surprise and that she has a chronic thrombus.  As long as it is recanalized, I think this is was important.  She is getting blood flow out of her leg.  She is staying active.  She is staying well-hydrated which definitely will help.  I think we can get her through the holiday season now.  Again she will be closer to home and not up at her [facility] home.  As always she is a delight to talk to.   Volanda Napoleon, MD 11/10/20228:52 AM

## 2021-06-10 NOTE — Telephone Encounter (Signed)
Called and informed patient of results, patient verbalized understanding and denies any questions or concerns at this time. Message sent to scheduling to call pt and schedule 3 month f/u.

## 2021-06-12 LAB — DRVVT CONFIRM: dRVVT Confirm: 1.9 ratio — ABNORMAL HIGH (ref 0.8–1.2)

## 2021-06-12 LAB — LUPUS ANTICOAGULANT PANEL
DRVVT: 115.8 s — ABNORMAL HIGH (ref 0.0–47.0)
PTT Lupus Anticoagulant: 44.5 s (ref 0.0–51.9)

## 2021-06-12 LAB — DRVVT MIX: dRVVT Mix: 46.4 s — ABNORMAL HIGH (ref 0.0–40.4)

## 2021-06-15 LAB — CARDIOLIPIN ANTIBODIES, IGG, IGM, IGA
Anticardiolipin IgA: <9 APL U/mL (ref 0–11)
Anticardiolipin IgG: <9 GPL U/mL (ref 0–14)
Anticardiolipin IgM: 10 MPL U/mL (ref 0–12)

## 2021-06-16 NOTE — Telephone Encounter (Signed)
PROLIA GIVEN 06/04/2021 NEXT INJECTION 12/03/2021

## 2021-06-22 ENCOUNTER — Other Ambulatory Visit: Payer: Self-pay

## 2021-06-22 ENCOUNTER — Ambulatory Visit (INDEPENDENT_AMBULATORY_CARE_PROVIDER_SITE_OTHER): Payer: PPO | Admitting: *Deleted

## 2021-06-22 DIAGNOSIS — I82401 Acute embolism and thrombosis of unspecified deep veins of right lower extremity: Secondary | ICD-10-CM | POA: Diagnosis not present

## 2021-06-22 DIAGNOSIS — Z5181 Encounter for therapeutic drug level monitoring: Secondary | ICD-10-CM | POA: Diagnosis not present

## 2021-06-22 LAB — POCT INR: INR: 3.8 — AB (ref 2.0–3.0)

## 2021-06-22 NOTE — Patient Instructions (Signed)
Description   Hold today's dose and then start taking 1/2 tablet daily except for 1 tablet on Mondays, Wednesdays, and Fridays. Recheck INR in 2 weeks. Coumadin Clinic [phone].

## 2021-06-23 ENCOUNTER — Ambulatory Visit: Payer: PPO | Admitting: Physician Assistant

## 2021-06-30 DIAGNOSIS — L820 Inflamed seborrheic keratosis: Secondary | ICD-10-CM | POA: Diagnosis not present

## 2021-07-04 ENCOUNTER — Encounter: Payer: Self-pay | Admitting: Physician Assistant

## 2021-07-04 NOTE — Progress Notes (Signed)
Cardiology Office Note    Date:  07/05/2021   ID:  Emma Lopez, DOB 1945-09-18, MRN [id number]  PCP:  Michael Boston, MD  Cardiologist:  Fransico Him, MD  Electrophysiologist:  None   Chief Complaint: f/u fatigue, dyspnea  History of Present Illness:   Emma Lopez is a 75 y.o. female with history of recurrent DVT/PE with positive lupus anticoagulant (followed by Dr. Marin Olp), HLD, sinus bradycardia, GERD, fibromyalgia, hyperthyroidism, endometriosis, GERD, hiatal hernia, osteoporosis, intermittent LBBB who is seen back for follow-up. She has previously seen Dr. Radford Pax for chest pain, SOB, and fatigue with several reassuring cardiac studies. Per Dr. Theodosia Blender notes she was also previously evaluated by pulmonary for her SOB and they felt it could be related to bradycardia and also that she had mild asthma and deconditioning. Coronary CTA 12/2017 was normal, calcium score zero without significant CAD. Event monitor 06/2020 showed average HR 55, range 45 to 127bpm, nonsustained a-tach max 19 beats, accelerated junctional rhythm, rare PAC/PVC, atrial triplet. ETT was performed to assess for chronotropic incompletence with HR response to 148bpm, 4.6 METS, hypertensive response with ST-TW changes after exercise felt intermediate risk in the context of baseline EKG abnormalities (appears she had a LBBB at that time -> had been told in the past she had this although it appears only intermittently). She had just recently also had a dobutamine stress shortly before at an outside facility in 06/2020 that showed no evidence of ischemia so Dr. Radford Pax felt that the ETT was a false positive. F/u ambulatory BP monitor 09/2020 showed average BP 135/55. 2D echo 10/2020 EF 60-65%, normal RV without significant abnormalities. At f/u OV 11/2020 she was still complaining of fatigue and dyspnea so CPX was ordered but she did not pursue this. For her history of recurrent VTE, she was previously on Xarelto and Pradaxa but is  now on Coumadin recommended for lifelong.  She is seen back for follow-up overall doing well from a cardiac standpoint. She had cancelled her CPX because she had subsequently developed PNA and did not wish to pursue this. In the interim she denies any recurrent issues with her breathing. Her main complaint is chronic fatigue which has been present for a long time. This was originally diagnosed in the context of fibromyalgia. Now she struggles with RLS which she feels interrupts her sleeping ability. She is not able to take a catchup nap due to this. No chest pain, palpitations or syncope reported.  Labwork independently reviewed: 06/2021 CBC, CMET OK 11/2020 TSH low, free T4/T3 normal, A1c wnl   Past Medical History:  Diagnosis Date   Abnormal EKG    Anxiety    Arthritis    oa   Bradycardia 12/14/2017   CIN III (cervical intraepithelial neoplasia III) 4332   Complication of anesthesia    did well last 2 times with procedures   Depression    DES exposure in utero    DVT (deep venous thrombosis) ([place]) 01/2009   LEFT LEG   Dyspnea on exertion 10/2017   Elevated triglycerides with high cholesterol    Endometriosis    Fibromyalgia    GERD (gastroesophageal reflux disease)    History of colon polyps    History of hiatal hernia    told by some md has, some say not   IBS (irritable bowel syndrome) 2015   Lupus anticoagulant disorder ([place])    Multinodular goiter    Osteoporosis 12/2017   T score -3.2 stable from prior study  PE (pulmonary embolism)    PONV (postoperative nausea and vomiting)    Restless legs syndrome    Sinus bradycardia    Sleep disorder    Thyroid disease    HYPERTHYROIDISM   Vitamin D deficiency     Past Surgical History:  Procedure Laterality Date   ABDOMINAL HYSTERECTOMY  2001   TAH, partial   APPENDECTOMY  1978   COLONOSCOPY WITH PROPOFOL N/A 12/07/2015   Procedure: COLONOSCOPY WITH PROPOFOL;  Surgeon: Garlan Fair, MD;  Location: [facility];   Service: Endoscopy;  Laterality: N/A;   colonscopy  7 yrs ago   other in past   ESOPHAGOGASTRODUODENOSCOPY (EGD) WITH PROPOFOL N/A 12/07/2015   Procedure: ESOPHAGOGASTRODUODENOSCOPY (EGD) WITH PROPOFOL;  Surgeon: Garlan Fair, MD;  Location: [facility];  Service: Endoscopy;  Laterality: N/A;   ESOPHAGOGASTRODUODENOSCOPY ENDOSCOPY  yrs ago   FACIAL COSMETIC SURGERY     GYNECOLOGIC CRYOSURGERY     MYOMECTOMY     TONSILLECTOMY      Current Medications: Current Meds  Medication Sig   acetaminophen (TYLENOL) 325 MG tablet Take 650 mg by mouth every 6 (six) hours as needed for moderate pain.   ALPRAZolam (XANAX) 0.25 MG tablet Take 1 tablet (0.25 mg total) by mouth every 8 (eight) hours as needed for anxiety.   B Complex Vitamins (B COMPLEX 1 PO) Take 1 each by mouth 2 (two) times daily. sublingual   Calcium Carbonate+Vitamin D 600-200 MG-UNIT TABS Take 1 tablet by mouth daily.   denosumab (PROLIA) 60 MG/ML SOLN injection Inject 60 mg into the skin every 6 (six) months. Administer in upper arm, thigh, or abdomen   escitalopram (LEXAPRO) 20 MG tablet Take 20 mg by mouth daily.   Multiple Vitamins-Minerals (MULTIVITAMIN PO) Take 1 tablet by mouth 2 (two) times daily.    Peppermint Oil 0.2 ML CPDR    PEPPERMINT OIL PO Take by mouth.   rOPINIRole (REQUIP XL) 2 MG 24 hr tablet Take 1 tablet (2 mg total) by mouth at bedtime.   rOPINIRole (REQUIP) 1 MG tablet Take 1 tablet (1 mg total) by mouth at bedtime.   warfarin (COUMADIN) 1 MG tablet TAKE AS DIRECTED BY THE COUMADIN CLINIC     Allergies:   Other, Warfarin sodium, and Sulfa antibiotics   Social History   Socioeconomic History   Marital status: Single    Spouse name: Not on file   Number of children: Not on file   Years of education: Not on file   Highest education level: Not on file  Occupational History   Not on file  Tobacco Use   Smoking status: Former    Packs/day: 0.10    Years: 6.00    Pack years: 0.60    Types:  Cigarettes    Quit date: 08/01/1978    Years since quitting: 42.9   Smokeless tobacco: Never   Tobacco comments:    only smoked 4-6 yrs 1 pack per month  Vaping Use   Vaping Use: Never used  Substance and Sexual Activity   Alcohol use: Not Currently    Alcohol/week: 0.0 standard drinks   Drug use: No   Sexual activity: Not Currently    Birth control/protection: Surgical    Comment: HYSTERECTOMY-1st intercourse 27-Fewer than 5 partners  Other Topics Concern   Not on file  Social History Narrative   Not on file   Social Determinants of Health   Financial Resource Strain: Not on file  Food Insecurity: Not on file  Transportation Needs: Not on file  Physical Activity: Not on file  Stress: Not on file  Social Connections: Not on file     Family History:  The patient's family history includes Alzheimer's disease in her maternal aunt and maternal aunt; Breast cancer in her maternal aunt, maternal aunt, and mother; Cancer in her father, maternal aunt, maternal aunt, and paternal aunt; Hypertension in her mother and sister; Stroke in her sister.  ROS:   Please see the history of present illness.  All other systems are reviewed and otherwise negative.    EKGs/Labs/Other Studies Reviewed:    Studies reviewed are outlined and summarized above. Reports included below if pertinent.  2D Echo 10/2020  1. Left ventricular ejection fraction, by estimation, is 60 to 65%. The  left ventricle has normal function. The left ventricle has no regional  wall motion abnormalities. Left ventricular diastolic parameters are  indeterminate.   2. Right ventricular systolic function is normal. The right ventricular  size is normal. There is normal pulmonary artery systolic pressure.   3. The mitral valve is normal in structure. Trivial mitral valve  regurgitation. No evidence of mitral stenosis.   4. The aortic valve has an indeterminant number of cusps. Aortic valve  regurgitation is not  visualized. No aortic stenosis is present.   5. The inferior vena cava is normal in size with greater than 50%  respiratory variability, suggesting right atrial pressure of 3 mmHg.   See EMR for comprehensive studies, summarized above.    EKG:  EKG is ordered today, personally reviewed, demonstrating sinus bradycardia 59bpm occasional PAC, nonspecific STT changes  Recent Labs: 11/17/2020: TSH 0.411 06/10/2021: ALT 13; BUN 17; Creatinine 0.76; Hemoglobin 13.5; Platelet Count 234; Potassium 4.1; Sodium 141  Recent Lipid Panel No results found for: CHOL, TRIG, HDL, CHOLHDL, VLDL, LDLCALC, LDLDIRECT  PHYSICAL EXAM:    VS:  BP 125/72   Pulse (!) 59   Ht 5\' 2"  (1.575 m)   Wt 135 lb 3.2 oz (61.3 kg)   SpO2 98%   BMI 24.73 kg/m   BMI: Body mass index is 24.73 kg/m.  GEN: Well nourished, well developed female in no acute distress HEENT: normocephalic, atraumatic Neck: no JVD, carotid bruits, or masses Cardiac: RRR; no murmurs, rubs, or gallops, no edema  Respiratory:  clear to auscultation bilaterally, normal work of breathing GI: soft, nontender, nondistended, + BS MS: no deformity or atrophy Skin: warm and dry, no rash Neuro:  Alert and Oriented x 3, Strength and sensation are intact, follows commands Psych: euthymic mood, full affect  Wt Readings from Last 3 Encounters:  07/05/21 135 lb 3.2 oz (61.3 kg)  06/10/21 137 lb 4 oz (62.3 kg)  05/18/21 135 lb 12.8 oz (61.6 kg)     ASSESSMENT & PLAN:   1. Generalized fatigue - thus far, no specific cardiac diagnoses to correlate with this. The patient reports history of significant RLS which impacts sleep hygiene, suspect this is contributing. She reports prior sleep study negative for OSA. Will check a ferritin level given fatigue, RLS. Otherwise recommend f/u primary care.  2. Dyspnea on exertion - improved, so will hold off on additional CPX testing at this time. Prior hypertensive response noted with exercise but f/u ambulatory BP  was felt satisfactory. Will continue to follow at subsequent visits for any recurrent cardiac symptoms.  3. Sinus bradycardia, also prior nonsustained a-tach, accelerated junctional rhythm, PAC/PVCs on monitor - HR appears at baseline without any recent accelerating symptoms. Would avoid  AVN blocking agents due to baseline bradycardia. If she should develop palpitations in the future would need to consider EP consultation. Also note hx of intermittent LBBB.  4. Recurrent DVT/PE - followed by Dr. Marin Olp, + lupus anticoagulant. Continues with regular INR checks.     Disposition: F/u with Dr. Radford Pax in 1 year.   Medication Adjustments/Labs and Tests Ordered: Current medicines are reviewed at length with the patient today.  Concerns regarding medicines are outlined above. Medication changes, Labs and Tests ordered today are summarized above and listed in the Patient Instructions accessible in Encounters.   Signed, Charlie Pitter, PA-C  07/05/2021 11:40 AM    [facility] Phone: [phone]; Fax: [phone]

## 2021-07-05 ENCOUNTER — Ambulatory Visit: Payer: PPO | Admitting: Physician Assistant

## 2021-07-05 ENCOUNTER — Encounter: Payer: Self-pay | Admitting: Physician Assistant

## 2021-07-05 ENCOUNTER — Other Ambulatory Visit: Payer: Self-pay

## 2021-07-05 VITALS — BP 125/72 | HR 59 | Ht 62.0 in | Wt 135.2 lb

## 2021-07-05 DIAGNOSIS — R5383 Other fatigue: Secondary | ICD-10-CM

## 2021-07-05 DIAGNOSIS — I491 Atrial premature depolarization: Secondary | ICD-10-CM | POA: Diagnosis not present

## 2021-07-05 DIAGNOSIS — I493 Ventricular premature depolarization: Secondary | ICD-10-CM

## 2021-07-05 DIAGNOSIS — R0609 Other forms of dyspnea: Secondary | ICD-10-CM

## 2021-07-05 DIAGNOSIS — I471 Supraventricular tachycardia: Secondary | ICD-10-CM | POA: Diagnosis not present

## 2021-07-05 DIAGNOSIS — I82409 Acute embolism and thrombosis of unspecified deep veins of unspecified lower extremity: Secondary | ICD-10-CM

## 2021-07-05 DIAGNOSIS — R001 Bradycardia, unspecified: Secondary | ICD-10-CM | POA: Diagnosis not present

## 2021-07-05 NOTE — Patient Instructions (Addendum)
Medication Instructions:  Your physician recommends that you continue on your current medications as directed. Please refer to the Current Medication list given to you today.  *If you need a refill on your cardiac medications before your next appointment, please call your pharmacy*   Lab Work: TODAY:  FERRITIN LEVEL  If you have labs (blood work) drawn today and your tests are completely normal, you will receive your results only by: [place] (if you have MyChart) OR A paper copy in the mail If you have any lab test that is abnormal or we need to change your treatment, we will call you to review the results.   Testing/Procedures: None ordered   Follow-Up: At [facility], you and your health needs are our priority.  As part of our continuing mission to provide you with exceptional heart care, we have created designated Provider Care Teams.  These Care Teams include your primary Cardiologist (physician) and Advanced Practice Providers (APPs -  Physician Assistants and Nurse Practitioners) who all work together to provide you with the care you need, when you need it.  We recommend signing up for the patient portal called "MyChart".  Sign up information is provided on this After Visit Summary.  MyChart is used to connect with patients for Virtual Visits (Telemedicine).  Patients are able to view lab/test results, encounter notes, upcoming appointments, etc.  Non-urgent messages can be sent to your provider as well.   To learn more about what you can do with MyChart, go to [email or web address].    Your next appointment:   12 month(s)  The format for your next appointment:   In Person  Provider:   Fransico Him, MD     Other Instructions

## 2021-07-06 ENCOUNTER — Ambulatory Visit (INDEPENDENT_AMBULATORY_CARE_PROVIDER_SITE_OTHER): Payer: PPO | Admitting: *Deleted

## 2021-07-06 DIAGNOSIS — Z5181 Encounter for therapeutic drug level monitoring: Secondary | ICD-10-CM

## 2021-07-06 DIAGNOSIS — I82401 Acute embolism and thrombosis of unspecified deep veins of right lower extremity: Secondary | ICD-10-CM | POA: Diagnosis not present

## 2021-07-06 LAB — FERRITIN: Ferritin: 69 ng/mL (ref 15–150)

## 2021-07-06 LAB — POCT INR: INR: 2.2 (ref 2.0–3.0)

## 2021-07-06 NOTE — Patient Instructions (Signed)
Description   Today take 1 tablet then continue taking 1/2 tablet daily except for 1 tablet on Mondays, Wednesdays, and Fridays. Recheck INR in 3 weeks & will do Lovenox Bridge instructions at this visit. [facility] [phone].

## 2021-07-06 NOTE — Progress Notes (Signed)
Pt has been made aware of normal result and verbalized understanding.  jw

## 2021-07-27 ENCOUNTER — Other Ambulatory Visit: Payer: Self-pay

## 2021-07-27 ENCOUNTER — Ambulatory Visit (INDEPENDENT_AMBULATORY_CARE_PROVIDER_SITE_OTHER): Payer: PPO

## 2021-07-27 DIAGNOSIS — Z5181 Encounter for therapeutic drug level monitoring: Secondary | ICD-10-CM

## 2021-07-27 DIAGNOSIS — I82401 Acute embolism and thrombosis of unspecified deep veins of right lower extremity: Secondary | ICD-10-CM

## 2021-07-27 LAB — POCT INR: INR: 2.1 (ref 2.0–3.0)

## 2021-07-27 MED ORDER — ENOXAPARIN SODIUM 60 MG/0.6ML IJ SOSY: 60.0000 mg | PREFILLED_SYRINGE | Freq: Two times a day (BID) | INTRAMUSCULAR | 1 refills | Status: DC

## 2021-07-27 NOTE — Patient Instructions (Addendum)
Description   Today take 1 tablet then start taking 1 tablet daily except for 1/2 tablet on Sundays, Tuesdays, and Thursdays. Take your last dosage of Warfarin on 07/28/21 see Lovenox bridging instructions.  Recheck INR on 08/09/2021 post procedure. [facility] [phone].    07/28/21: Last dose of warfarin.  07/29/21: No warfarin or enoxaparin (Lovenox).  07/30/21: Inject enoxaparin 60mg  in the fatty abdominal tissue at least 2 inches from the belly button twice a day about 12 hours apart, 8am and 8pm rotate sites. No warfarin.  07/31/21: Inject enoxaparin in the fatty tissue every 12 hours, 8am and 8pm. No warfarin.  08/01/21: Inject enoxaparin in the fatty tissue every 12 hours, 8am and 8pm. No warfarin.  08/02/21: Inject enoxaparin in the fatty tissue in the morning at 8 am (No PM dose). No warfarin.  08/03/21: Procedure Day - No enoxaparin - Resume warfarin in the evening or as directed by doctor (take an extra half tablet with usual dose for 2 days then resume normal dose).  08/04/21: Resume enoxaparin inject in the fatty tissue every 12 hours and take warfarin  08/05/21: Inject enoxaparin in the fatty tissue every 12 hours and take warfarin  08/06/21: Inject enoxaparin in the fatty tissue every 12 hours and take warfarin  08/07/21: Inject enoxaparin in the fatty tissue every 12 hours and take warfarin  08/08/21: Inject enoxaparin in the fatty tissue every 12 hours and take warfarin  08/09/21: warfarin appt to check INR.

## 2021-08-01 HISTORY — PX: REPLACEMENT TOTAL JOINT WRIST W/ PROSTHETIC IMPLANT: SUR1223

## 2021-08-03 DIAGNOSIS — M19041 Primary osteoarthritis, right hand: Secondary | ICD-10-CM | POA: Diagnosis not present

## 2021-08-03 DIAGNOSIS — M1811 Unilateral primary osteoarthritis of first carpometacarpal joint, right hand: Secondary | ICD-10-CM | POA: Diagnosis not present

## 2021-08-03 DIAGNOSIS — M13131 Monoarthritis, not elsewhere classified, right wrist: Secondary | ICD-10-CM | POA: Diagnosis not present

## 2021-08-03 DIAGNOSIS — G8918 Other acute postprocedural pain: Secondary | ICD-10-CM | POA: Diagnosis not present

## 2021-08-03 DIAGNOSIS — M19031 Primary osteoarthritis, right wrist: Secondary | ICD-10-CM | POA: Diagnosis not present

## 2021-08-09 ENCOUNTER — Ambulatory Visit (INDEPENDENT_AMBULATORY_CARE_PROVIDER_SITE_OTHER): Payer: PPO

## 2021-08-09 ENCOUNTER — Other Ambulatory Visit: Payer: Self-pay

## 2021-08-09 DIAGNOSIS — Z5181 Encounter for therapeutic drug level monitoring: Secondary | ICD-10-CM

## 2021-08-09 DIAGNOSIS — I82401 Acute embolism and thrombosis of unspecified deep veins of right lower extremity: Secondary | ICD-10-CM

## 2021-08-09 LAB — POCT INR: INR: 1.5 — AB (ref 2.0–3.0)

## 2021-08-09 NOTE — Patient Instructions (Signed)
Description   Take 1.5 tablets today, then take 1 tablet tomorrow, then resume same dosage 1 tablet daily except for 1/2 tablet on Sundays, Tuesdays, and Thursdays. Continue on Lovenox injections twice daily. Recheck INR on Friday. [facility] [phone].

## 2021-08-13 ENCOUNTER — Other Ambulatory Visit: Payer: Self-pay

## 2021-08-13 ENCOUNTER — Encounter: Payer: Self-pay | Admitting: Hematology & Oncology

## 2021-08-13 ENCOUNTER — Ambulatory Visit (INDEPENDENT_AMBULATORY_CARE_PROVIDER_SITE_OTHER): Payer: PPO

## 2021-08-13 ENCOUNTER — Inpatient Hospital Stay (HOSPITAL_BASED_OUTPATIENT_CLINIC_OR_DEPARTMENT_OTHER): Payer: PPO | Admitting: Hematology & Oncology

## 2021-08-13 ENCOUNTER — Inpatient Hospital Stay: Payer: PPO | Attending: Hematology & Oncology

## 2021-08-13 VITALS — BP 147/57 | HR 53 | Temp 98.3°F | Resp 19 | Wt 135.0 lb

## 2021-08-13 DIAGNOSIS — Z7901 Long term (current) use of anticoagulants: Secondary | ICD-10-CM | POA: Diagnosis not present

## 2021-08-13 DIAGNOSIS — I82401 Acute embolism and thrombosis of unspecified deep veins of right lower extremity: Secondary | ICD-10-CM

## 2021-08-13 DIAGNOSIS — I82402 Acute embolism and thrombosis of unspecified deep veins of left lower extremity: Secondary | ICD-10-CM | POA: Diagnosis not present

## 2021-08-13 DIAGNOSIS — D6862 Lupus anticoagulant syndrome: Secondary | ICD-10-CM | POA: Diagnosis not present

## 2021-08-13 DIAGNOSIS — Z5181 Encounter for therapeutic drug level monitoring: Secondary | ICD-10-CM | POA: Diagnosis not present

## 2021-08-13 LAB — CBC WITH DIFFERENTIAL (CANCER CENTER ONLY)
Abs Immature Granulocytes: 0.04 10*3/uL (ref 0.00–0.07)
Basophils Absolute: 0.1 10*3/uL (ref 0.0–0.1)
Basophils Relative: 1 %
Eosinophils Absolute: 0.2 10*3/uL (ref 0.0–0.5)
Eosinophils Relative: 2 %
HCT: 41 % (ref 36.0–46.0)
Hemoglobin: 13.1 g/dL (ref 12.0–15.0)
Immature Granulocytes: 1 %
Lymphocytes Relative: 25 %
Lymphs Abs: 2 10*3/uL (ref 0.7–4.0)
MCH: 31 pg (ref 26.0–34.0)
MCHC: 32 g/dL (ref 30.0–36.0)
MCV: 97.2 fL (ref 80.0–100.0)
Monocytes Absolute: 0.9 10*3/uL (ref 0.1–1.0)
Monocytes Relative: 11 %
Neutro Abs: 4.9 10*3/uL (ref 1.7–7.7)
Neutrophils Relative %: 60 %
Platelet Count: 287 10*3/uL (ref 150–400)
RBC: 4.22 MIL/uL (ref 3.87–5.11)
RDW: 12.9 % (ref 11.5–15.5)
WBC Count: 8 10*3/uL (ref 4.0–10.5)
nRBC: 0 % (ref 0.0–0.2)

## 2021-08-13 LAB — CMP (CANCER CENTER ONLY)
ALT: 21 U/L (ref 0–44)
AST: 21 U/L (ref 15–41)
Albumin: 3.9 g/dL (ref 3.5–5.0)
Alkaline Phosphatase: 58 U/L (ref 38–126)
Anion gap: 6 (ref 5–15)
BUN: 20 mg/dL (ref 8–23)
CO2: 29 mmol/L (ref 22–32)
Calcium: 8.9 mg/dL (ref 8.9–10.3)
Chloride: 104 mmol/L (ref 98–111)
Creatinine: 0.68 mg/dL (ref 0.44–1.00)
GFR, Estimated: >60 mL/min (ref >60)
Glucose, Bld: 114 mg/dL — ABNORMAL HIGH (ref 70–99)
Potassium: 3.7 mmol/L (ref 3.5–5.1)
Sodium: 139 mmol/L (ref 135–145)
Total Bilirubin: 0.2 mg/dL — ABNORMAL LOW (ref 0.3–1.2)
Total Protein: 6.3 g/dL — ABNORMAL LOW (ref 6.5–8.1)

## 2021-08-13 LAB — POCT INR: INR: 2.2 (ref 2.0–3.0)

## 2021-08-13 NOTE — Progress Notes (Signed)
Hematology and Oncology Follow Up Visit  Emma Lopez [id number] 1945-08-17 76 y.o. 08/13/2021   Principle Diagnosis:  Recurrent DVT of the left leg -- probable progression Positive lupus anticoagulant  Current Therapy:   Xarelto 20 mg p.o. daily -- d/c on 01/06/2021 Pradaxa 150 mg po BID - start on 01/06/2021 - d/c on 01/25/2021 Coumadin  -- doses by Coumadin Clinic -- keep INR 2.5-3.5     Interim History:  [name] is in for follow-up.  She now has a cast on the right arm.  She had surgery for this I think a few weeks ago.  This was on January 3 actually.  I am not sure exactly what kind of surgery she had since I cannot find the operative report..  She is on Lovenox.  Her INR today is 2.2.  She has cut more doses of Lovenox.  She feels well.  She had a quiet holiday season.  She has had no problems with bleeding.  There is no change in bowel or bladder habits..  She does have the positive lupus anticoagulant.  She has always tested positive for this.  Para she has had no issues with fever.  There is no chest wall pain.  She has had no cough.  Thankfully, she has had no issues with COVID.  Overall, I would say performance status is ECOG 1.     Medications:  Current Outpatient Medications:    acetaminophen (TYLENOL) 325 MG tablet, Take 650 mg by mouth every 6 (six) hours as needed for moderate pain., Disp: , Rfl:    ALPRAZolam (XANAX) 0.25 MG tablet, Take 1 tablet (0.25 mg total) by mouth every 8 (eight) hours as needed for anxiety., Disp: 30 tablet, Rfl: 0   B Complex Vitamins (B COMPLEX 1 PO), Take 1 each by mouth 2 (two) times daily. sublingual, Disp: , Rfl:    Calcium Carbonate+Vitamin D 600-200 MG-UNIT TABS, Take 1 tablet by mouth daily., Disp: , Rfl:    denosumab (PROLIA) 60 MG/ML SOLN injection, Inject 60 mg into the skin every 6 (six) months. Administer in upper arm, thigh, or abdomen, Disp: 180 mL, Rfl: 2   enoxaparin (LOVENOX) 60 MG/0.6ML injection, Inject 0.6 mLs (60  mg total) into the skin every 12 (twelve) hours. As Instructed by Anticoagulation clinic, Disp: 12 mL, Rfl: 1   escitalopram (LEXAPRO) 20 MG tablet, Take 20 mg by mouth daily., Disp: , Rfl:    Multiple Vitamins-Minerals (MULTIVITAMIN PO), Take 1 tablet by mouth 2 (two) times daily. , Disp: , Rfl:    Peppermint Oil 0.2 ML CPDR, , Disp: , Rfl:    PEPPERMINT OIL PO, Take by mouth., Disp: , Rfl:    rOPINIRole (REQUIP) 1 MG tablet, Take 1 tablet (1 mg total) by mouth at bedtime., Disp: 60 tablet, Rfl: 2   warfarin (COUMADIN) 1 MG tablet, TAKE AS DIRECTED BY THE COUMADIN CLINIC, Disp: 30 tablet, Rfl: 3  Allergies:  Allergies  Allergen Reactions   Other Other (See Comments)   Warfarin Sodium     Other reaction(s): rash (name brand coumadin is ok)   Sulfa Antibiotics Rash and Other (See Comments)    Past Medical History, Surgical history, Social history, and Family History were reviewed and updated.  Review of Systems: Review of Systems  Constitutional: Negative.   HENT:  Negative.    Eyes: Negative.   Respiratory: Negative.    Cardiovascular: Negative.   Gastrointestinal: Negative.   Endocrine: Negative.   Genitourinary: Negative.  Musculoskeletal: Negative.   Skin: Negative.   Neurological: Negative.   Hematological: Negative.   Psychiatric/Behavioral: Negative.     Physical Exam:  weight is 135 lb (61.2 kg). Her oral temperature is 98.3 F (36.8 C). Her blood pressure is 147/57 (abnormal) and her pulse is 53 (abnormal). Her respiration is 19 and oxygen saturation is 99%.   Wt Readings from Last 3 Encounters:  08/13/21 135 lb (61.2 kg)  07/05/21 135 lb 3.2 oz (61.3 kg)  06/10/21 137 lb 4 oz (62.3 kg)    Physical Exam Vitals reviewed.  HENT:     Head: Normocephalic and atraumatic.  Eyes:     Pupils: Pupils are equal, round, and reactive to light.  Cardiovascular:     Rate and Rhythm: Normal rate and regular rhythm.     Heart sounds: Normal heart sounds.  Pulmonary:      Effort: Pulmonary effort is normal.     Breath sounds: Normal breath sounds.  Abdominal:     General: Bowel sounds are normal.     Palpations: Abdomen is soft.  Musculoskeletal:        General: No tenderness or deformity. Normal range of motion.     Cervical back: Normal range of motion.     Comments: Her lower extremities show some swelling in the left lower leg.  There is nonpitting edema in the left ankle.  She has good pulses in the distal extremities.  No obvious venous cord is noted in the legs.  Lymphadenopathy:     Cervical: No cervical adenopathy.  Skin:    General: Skin is warm and dry.     Findings: No erythema or rash.  Neurological:     Mental Status: She is alert and oriented to person, place, and time.  Psychiatric:        Behavior: Behavior normal.        Thought Content: Thought content normal.        Judgment: Judgment normal.     Lab Results  Component Value Date   WBC 8.0 08/13/2021   HGB 13.1 08/13/2021   HCT 41.0 08/13/2021   MCV 97.2 08/13/2021   PLT 287 08/13/2021     Chemistry      Component Value Date/Time   NA 141 06/10/2021 0834   NA 142 01/03/2018 1601   K 4.1 06/10/2021 0834   CL 105 06/10/2021 0834   CO2 28 06/10/2021 0834   BUN 17 06/10/2021 0834   BUN 20 01/03/2018 1601   CREATININE 0.76 06/10/2021 0834      Component Value Date/Time   CALCIUM 8.9 06/10/2021 0834   ALKPHOS 80 06/10/2021 0834   AST 17 06/10/2021 0834   ALT 13 06/10/2021 0834   BILITOT 0.3 06/10/2021 0834      Impression and Plan: [name] is a very charming 76 year old white female.  She has a history of a DVT.  She was on maintenance Xarelto.  She was on lifelong therapy.  Subsequently had a acute thrombus in the left leg.  She does have the lupus anticoagulant which I believe is pathologic.  She will continue  on lifelong Coumadin.  I hate that she had surgery for the right wrist.  It sounds like she may need surgery for the left wrist.  Her INR is being  monitored by the Coumadin Clinic.  They are doing a wonderful job.  Hopefully, she will be able to go up to her [facility] and enjoyed to winter up there.  She may stay locally.  I will see her back in April.  Again she may think about another surgery with respect to her left wrist.  If so, then we will do the same Lovenox protocol.    Volanda Napoleon, MD 1/13/202312:35 PM

## 2021-08-13 NOTE — Patient Instructions (Signed)
Description   Take 1.5 tablets today and 1.5 tablets tomorrow and then resume same dosage 1 tablet daily except for 1/2 tablet on Sundays, Tuesdays, and Thursdays. Continue on Lovenox injections twice daily until 08/14/21. Recheck INR in 1 week.  [facility]in Clinic [phone].

## 2021-08-15 LAB — LUPUS ANTICOAGULANT PANEL
DRVVT: 67.1 s — ABNORMAL HIGH (ref 0.0–47.0)
PTT Lupus Anticoagulant: 38.5 s (ref 0.0–51.9)

## 2021-08-15 LAB — DRVVT MIX: dRVVT Mix: 42.1 s — ABNORMAL HIGH (ref 0.0–40.4)

## 2021-08-15 LAB — DRVVT CONFIRM: dRVVT Confirm: 1.4 ratio — ABNORMAL HIGH (ref 0.8–1.2)

## 2021-08-18 DIAGNOSIS — M1811 Unilateral primary osteoarthritis of first carpometacarpal joint, right hand: Secondary | ICD-10-CM | POA: Diagnosis not present

## 2021-08-18 DIAGNOSIS — M1812 Unilateral primary osteoarthritis of first carpometacarpal joint, left hand: Secondary | ICD-10-CM | POA: Diagnosis not present

## 2021-08-18 DIAGNOSIS — M180 Bilateral primary osteoarthritis of first carpometacarpal joints: Secondary | ICD-10-CM | POA: Diagnosis not present

## 2021-08-18 DIAGNOSIS — M79644 Pain in right finger(s): Secondary | ICD-10-CM | POA: Diagnosis not present

## 2021-08-20 ENCOUNTER — Other Ambulatory Visit: Payer: Self-pay

## 2021-08-20 ENCOUNTER — Ambulatory Visit (INDEPENDENT_AMBULATORY_CARE_PROVIDER_SITE_OTHER): Payer: PPO | Admitting: *Deleted

## 2021-08-20 DIAGNOSIS — Z5181 Encounter for therapeutic drug level monitoring: Secondary | ICD-10-CM | POA: Diagnosis not present

## 2021-08-20 DIAGNOSIS — I82401 Acute embolism and thrombosis of unspecified deep veins of right lower extremity: Secondary | ICD-10-CM | POA: Diagnosis not present

## 2021-08-20 LAB — POCT INR: INR: 3.2 — AB (ref 2.0–3.0)

## 2021-08-20 NOTE — Patient Instructions (Signed)
Description   Continue taking Warfarin 1 tablet daily except for 1/2 tablet on Sundays, Tuesdays, and Thursdays. Recheck INR in 2 weeks. [facility] [phone].

## 2021-09-01 DIAGNOSIS — M79644 Pain in right finger(s): Secondary | ICD-10-CM | POA: Diagnosis not present

## 2021-09-01 DIAGNOSIS — Z4789 Encounter for other orthopedic aftercare: Secondary | ICD-10-CM | POA: Diagnosis not present

## 2021-09-03 ENCOUNTER — Other Ambulatory Visit: Payer: Self-pay

## 2021-09-03 ENCOUNTER — Ambulatory Visit (INDEPENDENT_AMBULATORY_CARE_PROVIDER_SITE_OTHER): Payer: PPO | Admitting: *Deleted

## 2021-09-03 DIAGNOSIS — Z5181 Encounter for therapeutic drug level monitoring: Secondary | ICD-10-CM

## 2021-09-03 LAB — POCT INR: INR: 2.9 (ref 2.0–3.0)

## 2021-09-03 NOTE — Patient Instructions (Signed)
Description   Continue taking Warfarin 1 tablet daily except for 1/2 tablet on Sundays, Tuesdays, and Thursdays. Recheck INR in 3 weeks. [facility] [phone].

## 2021-09-16 DIAGNOSIS — H401411 Capsular glaucoma with pseudoexfoliation of lens, right eye, mild stage: Secondary | ICD-10-CM | POA: Diagnosis not present

## 2021-09-16 DIAGNOSIS — H5211 Myopia, right eye: Secondary | ICD-10-CM | POA: Diagnosis not present

## 2021-09-16 DIAGNOSIS — H5202 Hypermetropia, left eye: Secondary | ICD-10-CM | POA: Diagnosis not present

## 2021-09-17 DIAGNOSIS — M79644 Pain in right finger(s): Secondary | ICD-10-CM | POA: Diagnosis not present

## 2021-09-21 DIAGNOSIS — M79644 Pain in right finger(s): Secondary | ICD-10-CM | POA: Diagnosis not present

## 2021-09-24 DIAGNOSIS — M79644 Pain in right finger(s): Secondary | ICD-10-CM | POA: Diagnosis not present

## 2021-09-27 ENCOUNTER — Other Ambulatory Visit: Payer: Self-pay

## 2021-09-27 ENCOUNTER — Other Ambulatory Visit (HOSPITAL_COMMUNITY)
Admission: RE | Admit: 2021-09-27 | Discharge: 2021-09-27 | Disposition: A | Discharge status: Home / Self Care | Payer: PPO | Source: Ambulatory Visit | Attending: Obstetrics and Gynecology | Admitting: Obstetrics and Gynecology

## 2021-09-27 ENCOUNTER — Ambulatory Visit (INDEPENDENT_AMBULATORY_CARE_PROVIDER_SITE_OTHER): Payer: PPO | Admitting: Obstetrics and Gynecology

## 2021-09-27 ENCOUNTER — Encounter: Payer: Self-pay | Admitting: Obstetrics and Gynecology

## 2021-09-27 VITALS — BP 150/72 | HR 56 | Ht 61.0 in | Wt 134.0 lb

## 2021-09-27 DIAGNOSIS — Z8041 Family history of malignant neoplasm of ovary: Secondary | ICD-10-CM

## 2021-09-27 DIAGNOSIS — Z9189 Other specified personal risk factors, not elsewhere classified: Secondary | ICD-10-CM | POA: Insufficient documentation

## 2021-09-27 DIAGNOSIS — Z5181 Encounter for therapeutic drug level monitoring: Secondary | ICD-10-CM | POA: Diagnosis not present

## 2021-09-27 DIAGNOSIS — Z87410 Personal history of cervical dysplasia: Secondary | ICD-10-CM | POA: Diagnosis not present

## 2021-09-27 DIAGNOSIS — Z01419 Encounter for gynecological examination (general) (routine) without abnormal findings: Secondary | ICD-10-CM | POA: Diagnosis not present

## 2021-09-27 DIAGNOSIS — Z779 Other contact with and (suspected) exposures hazardous to health: Secondary | ICD-10-CM | POA: Diagnosis not present

## 2021-09-27 DIAGNOSIS — Z803 Family history of malignant neoplasm of breast: Secondary | ICD-10-CM | POA: Diagnosis not present

## 2021-09-27 NOTE — Progress Notes (Signed)
76 y.o. G0P0 Single Caucasian female here for annual breast and pelvic exam.    Patient followed for osteoporosis, hx of DES exposure and prior cervical dysplasia, family history of breast and ovarian cancer.  She is using Prolia.  Last injection was 06/02/21.   No further vaginal bleeding.   Had her third DVT in April.  Back on coumadin.   Pneumonia in August.  Still with some cough.   Feeling fatigued.   She is asking my opinion about Cymbalta.  Her PCP is recommending changing off of Lexapro.  Has fibromyalgia and restless leg syndrome.  Saw Dr. Valentina Shaggy PA.  PCP: Cristie Hem, MD  No LMP recorded. Patient has had a hysterectomy.           Sexually active: No.  The current method of family planning is status post hysterectomy for CIN III.    Exercising: No.  The patient does not participate in regular exercise at present. Smoker:  Former--quit over 40 years ago  Health Maintenance: Pap:  08-19-20 Neg, 07-03-19 Neg, 06-07-18 Neg History of abnormal Pap:  yes, Hx of cryotherapy and CIN III MMG:  01-20-21 Neg/BiRads1 Colonoscopy:  2022 incomplete:polyps;will repeat 11/2021 BMD: 05-08-20  Result : Osteoporosis TDaP: Up to date Gardasil:   n/a BOF:BPZWCH Hep C:Unsure Screening Labs:  PCP and hematology.    reports that she quit smoking about 43 years ago. Her smoking use included cigarettes. She has a 0.60 pack-year smoking history. She has never used smokeless tobacco. She reports current alcohol use. She reports that she does not use drugs.  Past Medical History:  Diagnosis Date   Abnormal EKG    Anxiety    Arthritis    oa   Bradycardia 12/14/2017   CIN III (cervical intraepithelial neoplasia III) 8527   Complication of anesthesia    did well last 2 times with procedures   Depression    DES exposure in utero    DVT (deep venous thrombosis) ([place]) 01/2009   LEFT LEG   DVT (deep venous thrombosis) ([place]) 10/30/2020   Dyspnea on exertion 10/2017   Elevated  triglycerides with high cholesterol    Endometriosis    Fibromyalgia    GERD (gastroesophageal reflux disease)    History of colon polyps    History of hiatal hernia    told by some md has, some say not   IBS (irritable bowel syndrome) 2015   Left bundle branch block    intermittent   Lupus anticoagulant disorder ([place])    Multinodular goiter    Osteoporosis 12/2017   T score -3.2 stable from prior study   PE (pulmonary embolism)    Pneumonia 03/01/2021   PONV (postoperative nausea and vomiting)    Restless legs syndrome    Sinus bradycardia    Sleep disorder    Thyroid disease    HYPERTHYROIDISM   Vitamin D deficiency     Past Surgical History:  Procedure Laterality Date   ABDOMINAL HYSTERECTOMY  2001   TAH, partial   APPENDECTOMY  1978   COLONOSCOPY WITH PROPOFOL N/A 12/07/2015   Procedure: COLONOSCOPY WITH PROPOFOL;  Surgeon: Garlan Fair, MD;  Location: [facility];  Service: Endoscopy;  Laterality: N/A;   colonscopy  7 yrs ago   other in past   ESOPHAGOGASTRODUODENOSCOPY (EGD) WITH PROPOFOL N/A 12/07/2015   Procedure: ESOPHAGOGASTRODUODENOSCOPY (EGD) WITH PROPOFOL;  Surgeon: Garlan Fair, MD;  Location: [facility];  Service: Endoscopy;  Laterality: N/A;   ESOPHAGOGASTRODUODENOSCOPY ENDOSCOPY  yrs ago  FACIAL COSMETIC SURGERY     GYNECOLOGIC CRYOSURGERY     MYOMECTOMY     REPLACEMENT TOTAL JOINT WRIST W/ PROSTHETIC IMPLANT Right 08/01/2021   TONSILLECTOMY      Current Outpatient Medications  Medication Sig Dispense Refill   acetaminophen (TYLENOL) 325 MG tablet Take 650 mg by mouth every 6 (six) hours as needed for moderate pain.     ALPRAZolam (XANAX) 0.25 MG tablet Take 1 tablet (0.25 mg total) by mouth every 8 (eight) hours as needed for anxiety. 30 tablet 0   B Complex Vitamins (B COMPLEX 1 PO) Take 1 each by mouth 2 (two) times daily. sublingual     Calcium Carbonate+Vitamin D 600-200 MG-UNIT TABS Take 1 tablet by mouth daily.     denosumab  (PROLIA) 60 MG/ML SOLN injection Inject 60 mg into the skin every 6 (six) months. Administer in upper arm, thigh, or abdomen 180 mL 2   escitalopram (LEXAPRO) 20 MG tablet Take 20 mg by mouth daily.     Multiple Vitamins-Minerals (MULTIVITAMIN PO) Take 1 tablet by mouth 2 (two) times daily.      Peppermint Oil 0.2 ML CPDR      rOPINIRole (REQUIP XL) 2 MG 24 hr tablet ropinirole ER 2 mg tablet,extended release 24 hr  TAKE 1 TABLET BY MOUTH AT BEDTIME.     warfarin (COUMADIN) 1 MG tablet TAKE AS DIRECTED BY THE COUMADIN CLINIC 30 tablet 3   No current facility-administered medications for this visit.    Family History  Problem Relation Age of Onset   Hypertension Mother    Breast cancer Mother        39's   Cancer Father        COLON   Hypertension Sister    Stroke Sister    Breast cancer Maternal Aunt        Age 80's   Cancer Maternal Aunt        Melanoma   Alzheimer's disease Maternal Aunt    Cancer Paternal Aunt        OVARIAN and COLON   Breast cancer Maternal Aunt        70's   Cancer Maternal Aunt        Colon CA   Alzheimer's disease Maternal Aunt     Review of Systems  All other systems reviewed and are negative.  Exam:   BP (!) 150/72    Pulse (!) 56    Ht 5\' 1"  (1.549 m)    Wt 134 lb (60.8 kg)    SpO2 96%    BMI 25.32 kg/m     General appearance: alert, cooperative and appears stated age Head: normocephalic, without obvious abnormality, atraumatic Neck: no adenopathy, supple, symmetrical, trachea midline and thyroid normal to inspection and palpation Lungs: clear to auscultation bilaterally Breasts: normal appearance, no masses or tenderness, No nipple retraction or dimpling, No nipple discharge or bleeding, No axillary adenopathy Heart: regular rate and rhythm Abdomen: soft, non-tender; no masses, no organomegaly Extremities: extremities normal, atraumatic, no cyanosis or edema Skin: skin color, texture, turgor normal. No rashes or lesions Lymph nodes:  cervical, supraclavicular, and axillary nodes normal. Neurologic: grossly normal  Pelvic: External genitalia:  no lesions              No abnormal inguinal nodes palpated.              Urethra:  normal appearing urethra with no masses, tenderness or lesions  Bartholins and Skenes: normal                 Vagina: normal appearing vagina with normal color and discharge, no lesions              Cervix:  absent.  Atrophy noted.               Pap taken: yes Bimanual Exam:  Uterus:  absent              Adnexa: no mass, fullness, tenderness              Rectal exam: yes.  Confirms.              Anus:  normal sphincter tone, no lesions  Chaperone was present for exam:  Estill Bamberg, CMA  Assessment:   Well woman visit with gynecologic exam. Status post hysterectomy in 2001 for CIN III. Hx CIN III 2000. Osteoporosis.  Hx DES exposure.  Hx DVT and PE. FH breast and ovarian cancer.  Negative genetic testing.  Doing yearly ultrasounds.  Medication monitoring encounter.  Use of SSRI.  Plan: Mammogram screening discussed. Self breast awareness reviewed. Pap and HR HPV collected. Guidelines for Calcium, Vitamin D, regular exercise program including cardiovascular and weight bearing exercise. Prolia in May, 2023.  BMD in October, 2023.  CA125 today.  Will schedule pelvic ultrasound.  We discussed the differences between Lexapro and Cymbalta.  Follow up annually and prn.   After visit summary provided.   34 min  total time was spent for this patient encounter, including preparation, face-to-face counseling with the patient, coordination of care, and documentation of the encounter.

## 2021-09-27 NOTE — Patient Instructions (Signed)

## 2021-09-28 LAB — CA 125: CA 125: 4 U/mL (ref <35)

## 2021-09-29 DIAGNOSIS — Z4789 Encounter for other orthopedic aftercare: Secondary | ICD-10-CM | POA: Diagnosis not present

## 2021-09-29 DIAGNOSIS — M79644 Pain in right finger(s): Secondary | ICD-10-CM | POA: Diagnosis not present

## 2021-09-30 LAB — CYTOLOGY - PAP
Comment: NEGATIVE
Diagnosis: REACTIVE
High risk HPV: NEGATIVE

## 2021-10-01 ENCOUNTER — Ambulatory Visit (INDEPENDENT_AMBULATORY_CARE_PROVIDER_SITE_OTHER): Payer: PPO | Admitting: *Deleted

## 2021-10-01 ENCOUNTER — Other Ambulatory Visit: Payer: Self-pay

## 2021-10-01 DIAGNOSIS — I82401 Acute embolism and thrombosis of unspecified deep veins of right lower extremity: Secondary | ICD-10-CM | POA: Diagnosis not present

## 2021-10-01 DIAGNOSIS — Z5181 Encounter for therapeutic drug level monitoring: Secondary | ICD-10-CM

## 2021-10-01 LAB — POCT INR: INR: 3.5 — AB (ref 2.0–3.0)

## 2021-10-01 NOTE — Patient Instructions (Signed)
Description   ?Continue taking Warfarin 1 tablet daily except for 1/2 tablet on Sundays, Tuesdays, and Thursdays. Recheck INR in 5 weeks. [facility] [phone].  ?  ?  ?

## 2021-10-05 ENCOUNTER — Other Ambulatory Visit: Payer: Self-pay

## 2021-10-05 MED ORDER — WARFARIN SODIUM 1 MG PO TABS: ORAL_TABLET | ORAL | 3 refills | Status: DC

## 2021-10-07 ENCOUNTER — Other Ambulatory Visit: Payer: Self-pay

## 2021-10-07 DIAGNOSIS — R87629 Unspecified abnormal cytological findings in specimens from vagina: Secondary | ICD-10-CM

## 2021-10-12 NOTE — Progress Notes (Signed)
?  Subjective:  ?  ? Patient ID: Emma Lopez, female   DOB: 1946-05-07, 76 y.o.   MRN: [id number] ?Patient did not take Coumadin this morning. ? ?HPI ?Patient here today for colposcopy with pap 09-27-21 benign glandular cells:Neg HR HPV. ? ?She did not take her coumadin today. ? ?She will reschedule her pelvic US.  ? ?PAP HISTORY: ? ?09-27-21 benign glandular cells:Neg HR HPV ?Hx of cryotherapy and CIN III ? ?Hx hysterectomy and DES exposure. ? ? ?Review of Systems  ?All other systems reviewed and are negative. ?LMP: Hyst ?Contraception: Hyst.  Ovaries remain. ? ?   ?Objective:  ? Physical Exam ?Genitourinary: ? ? ? ? ? ? ?Colposcopy of the vulva and vagina.  ?Consent done.  ?3% acetic acid used in vagina and on the vulva. ?Atrophy of the vagina noted.  ?Small cystic area of the right vaginal biopsy - biopsy taken with Tischler.  ?Tissue to pathology as specimen number 1.  ?Monsel's placed.  ?Minimal EBL.  ? ?Colposcopy of the vulva with polypoid 1 mm projections at the introitus.  ?One removed from the left and one removed from the right with fine scissors.  ?Tissue sent as specimen number 2.  ?Silver nitrate placed.  ?Minimal EBL. ?   ?Assessment:  ?   ?Abnormal pap. ?DES exposure.  ?Hx cervical dysplasia.  ?Status post hysterectomy.  ?FH ovarian cancer.  ?   ?Plan:  ?   ?FU biopsies. ?Return for pelvic US.  ?   ?

## 2021-10-13 ENCOUNTER — Ambulatory Visit: Payer: PPO | Admitting: Obstetrics and Gynecology

## 2021-10-13 ENCOUNTER — Other Ambulatory Visit: Payer: Self-pay

## 2021-10-13 ENCOUNTER — Other Ambulatory Visit (HOSPITAL_COMMUNITY)
Admission: RE | Admit: 2021-10-13 | Discharge: 2021-10-13 | Disposition: A | Discharge status: Home / Self Care | Payer: PPO | Source: Ambulatory Visit | Attending: Obstetrics and Gynecology | Admitting: Obstetrics and Gynecology

## 2021-10-13 ENCOUNTER — Encounter: Payer: Self-pay | Admitting: Obstetrics and Gynecology

## 2021-10-13 VITALS — BP 122/80 | HR 53 | Ht 61.0 in | Wt 134.0 lb

## 2021-10-13 DIAGNOSIS — D069 Carcinoma in situ of cervix, unspecified: Secondary | ICD-10-CM | POA: Diagnosis not present

## 2021-10-13 DIAGNOSIS — N9089 Other specified noninflammatory disorders of vulva and perineum: Secondary | ICD-10-CM | POA: Diagnosis not present

## 2021-10-13 DIAGNOSIS — Z9189 Other specified personal risk factors, not elsewhere classified: Secondary | ICD-10-CM | POA: Diagnosis not present

## 2021-10-13 DIAGNOSIS — R87629 Unspecified abnormal cytological findings in specimens from vagina: Secondary | ICD-10-CM | POA: Insufficient documentation

## 2021-10-13 DIAGNOSIS — Z779 Other contact with and (suspected) exposures hazardous to health: Secondary | ICD-10-CM

## 2021-10-13 DIAGNOSIS — Z87410 Personal history of cervical dysplasia: Secondary | ICD-10-CM

## 2021-10-14 ENCOUNTER — Other Ambulatory Visit: Payer: PPO | Admitting: Obstetrics and Gynecology

## 2021-10-14 ENCOUNTER — Other Ambulatory Visit: Payer: PPO

## 2021-10-17 ENCOUNTER — Other Ambulatory Visit: Payer: Self-pay

## 2021-10-17 ENCOUNTER — Emergency Department (HOSPITAL_COMMUNITY): Payer: PPO

## 2021-10-17 ENCOUNTER — Encounter (HOSPITAL_COMMUNITY): Payer: Self-pay | Admitting: Emergency Medicine

## 2021-10-17 ENCOUNTER — Emergency Department (HOSPITAL_COMMUNITY)
Admission: EM | Admit: 2021-10-17 | Discharge: 2021-10-17 | Disposition: A | Discharge status: Home / Self Care | Payer: PPO | Attending: Emergency Medicine | Admitting: Emergency Medicine

## 2021-10-17 DIAGNOSIS — R0789 Other chest pain: Secondary | ICD-10-CM | POA: Diagnosis not present

## 2021-10-17 DIAGNOSIS — Z7901 Long term (current) use of anticoagulants: Secondary | ICD-10-CM | POA: Diagnosis not present

## 2021-10-17 DIAGNOSIS — Z20822 Contact with and (suspected) exposure to covid-19: Secondary | ICD-10-CM | POA: Insufficient documentation

## 2021-10-17 DIAGNOSIS — R202 Paresthesia of skin: Secondary | ICD-10-CM | POA: Insufficient documentation

## 2021-10-17 DIAGNOSIS — R079 Chest pain, unspecified: Secondary | ICD-10-CM | POA: Diagnosis not present

## 2021-10-17 DIAGNOSIS — R5383 Other fatigue: Secondary | ICD-10-CM | POA: Diagnosis not present

## 2021-10-17 LAB — BASIC METABOLIC PANEL
Anion gap: 8 (ref 5–15)
BUN: 15 mg/dL (ref 8–23)
CO2: 25 mmol/L (ref 22–32)
Calcium: 8.7 mg/dL — ABNORMAL LOW (ref 8.9–10.3)
Chloride: 105 mmol/L (ref 98–111)
Creatinine, Ser: 0.7 mg/dL (ref 0.44–1.00)
GFR, Estimated: >60 mL/min (ref >60)
Glucose, Bld: 103 mg/dL — ABNORMAL HIGH (ref 70–99)
Potassium: 4.3 mmol/L (ref 3.5–5.1)
Sodium: 138 mmol/L (ref 135–145)

## 2021-10-17 LAB — CBC
HCT: 42.6 % (ref 36.0–46.0)
Hemoglobin: 13.7 g/dL (ref 12.0–15.0)
MCH: 30.6 pg (ref 26.0–34.0)
MCHC: 32.2 g/dL (ref 30.0–36.0)
MCV: 95.1 fL (ref 80.0–100.0)
Platelets: 263 10*3/uL (ref 150–400)
RBC: 4.48 MIL/uL (ref 3.87–5.11)
RDW: 13.6 % (ref 11.5–15.5)
WBC: 7.6 10*3/uL (ref 4.0–10.5)
nRBC: 0 % (ref 0.0–0.2)

## 2021-10-17 LAB — RESP PANEL BY RT-PCR (FLU A&B, COVID) ARPGX2
Influenza A by PCR: NEGATIVE
Influenza B by PCR: NEGATIVE
SARS Coronavirus 2 by RT PCR: NEGATIVE

## 2021-10-17 LAB — CBG MONITORING, ED: Glucose-Capillary: 94 mg/dL (ref 70–99)

## 2021-10-17 LAB — TROPONIN I (HIGH SENSITIVITY)
Troponin I (High Sensitivity): 8 ng/L (ref <18)
Troponin I (High Sensitivity): 9 ng/L (ref <18)

## 2021-10-17 LAB — T4, FREE: Free T4: 1.05 ng/dL (ref 0.61–1.12)

## 2021-10-17 LAB — D-DIMER, QUANTITATIVE: D-Dimer, Quant: <0.27 ug/mL-FEU (ref 0.00–0.50)

## 2021-10-17 LAB — PROTIME-INR
INR: 2 — ABNORMAL HIGH (ref 0.8–1.2)
Prothrombin Time: 22.9 seconds — ABNORMAL HIGH (ref 11.4–15.2)

## 2021-10-17 LAB — TSH: TSH: 0.498 u[IU]/mL (ref 0.350–4.500)

## 2021-10-17 MED ORDER — ALUM & MAG HYDROXIDE-SIMETH 200-200-20 MG/5ML PO SUSP
30.0000 mL | Freq: Once | ORAL | Status: AC
  Administered 2021-10-17: 30 mL via ORAL
  Filled 2021-10-17: qty 30

## 2021-10-17 MED ORDER — LIDOCAINE VISCOUS HCL 2 % MT SOLN
15.0000 mL | Freq: Once | OROMUCOSAL | Status: AC
  Administered 2021-10-17: 15 mL via ORAL
  Filled 2021-10-17: qty 15

## 2021-10-17 MED ORDER — LIDOCAINE 5 % EX PTCH: 1.0000 | MEDICATED_PATCH | CUTANEOUS | Status: DC

## 2021-10-17 NOTE — ED Notes (Signed)
Pt states currently her back is feeling hot like a hot flash.  ?

## 2021-10-17 NOTE — ED Notes (Signed)
Pt states she didn't take her Warfarin on Wednesday d/t a  procedure done at her Obgyn. Pt states her INR range is usually 2.5 -  3.5. ?

## 2021-10-17 NOTE — ED Triage Notes (Signed)
Patient complains of recurrent left sided chest pain that started earlier this morning that eventually was accompanied by bilateral hand tingling and diaphoresis. Patient states pain is still intermittent and hands continue to tingle. Patient alert, oriented, speaking in complete sentences as this time. Patient states she has had regular indigestion recently and states that today when she burps her discomfort improves. ?

## 2021-10-17 NOTE — ED Notes (Signed)
Pt says occasionally she will have a dull pain on the left side of her back. She does feel it when she takes a deep breath along the spine.  ?

## 2021-10-17 NOTE — ED Provider Notes (Signed)
?  Physical Exam  ?BP (!) 130/94   Pulse 66   Temp 97.9 ?F (36.6 ?C) (Oral)   Resp 16   Ht '5\' 2"'$  (1.575 m)   Wt 60.8 kg   SpO2 97%   BMI 24.51 kg/m?  ? ?Physical Exam ?Vitals and nursing note reviewed.  ?Constitutional:   ?   General: She is not in acute distress. ?   Appearance: She is well-developed.  ?HENT:  ?   Head: Normocephalic and atraumatic.  ?Eyes:  ?   Conjunctiva/sclera: Conjunctivae normal.  ?Cardiovascular:  ?   Rate and Rhythm: Normal rate and regular rhythm.  ?   Heart sounds: No murmur heard. ?Pulmonary:  ?   Effort: Pulmonary effort is normal. No respiratory distress.  ?   Breath sounds: Normal breath sounds.  ?Chest:  ?   Chest wall: Tenderness present.  ?Abdominal:  ?   Palpations: Abdomen is soft.  ?   Tenderness: There is no abdominal tenderness.  ?Musculoskeletal:     ?   General: No swelling.  ?   Cervical back: Neck supple.  ?Skin: ?   General: Skin is warm and dry.  ?   Capillary Refill: Capillary refill takes less than 2 seconds.  ?Neurological:  ?   Mental Status: She is alert.  ?Psychiatric:     ?   Mood and Affect: Mood normal.  ? ? ?Procedures  ?Procedures ? ?ED Course / MDM  ? ?Clinical Course as of 10/17/21 1807  ?Sun Oct 17, 2021  ?1522 Pendign delta and dimer, CP [MK]  ?  ?Clinical Course User Index ?[MK] Daysha Ashmore, Debe Coder, MD  ? ?Medical Decision Making ?Amount and/or Complexity of Data Reviewed ?Labs: ordered. ?Radiology: ordered. ? ?Risk ?OTC drugs. ?Prescription drug management. ? ? ?Patient received in handoff.  Pending delta troponin and D-dimer at time of signout.  Both of these tests were reassuringly negative.  Patient with reproducible tenderness to the intercostal muscles on the left.  Heart score is 4.  Using shared decision making, we decided to send the patient home with outpatient follow-up as her chest pain is reproducible and I have low suspicion for ACS. ? ? ? ? ?  ?Teressa Lower, MD ?10/17/21 1903 ? ?

## 2021-10-17 NOTE — ED Provider Notes (Signed)
[facility] EMERGENCY DEPARTMENT Provider Note   CSN: [id number] Arrival date & time: 10/17/21  1210     History  Chief Complaint  Patient presents with   Chest Pain    JAQUAYLA DEVEREUX is a 76 y.o. female.   Chest Pain Associated symptoms: back pain    76 year old female presenting to the emergency department with a chief complaint of chest pain.  The patient states that she has had pain and discomfort in the left side of her chest with associated radiation to her left back, specifically her left shoulder blade.  She denies any shortness of breath.  She states that she has some associated indigestion discomfort that improves while burping.  She states that her discomfort improves after burping.  She denies any radiation of the pain to her shoulder, arm or jaw.  She denies any chest pressure.  She states it is more of a dull pain and tightness.  She endorses bilateral tingling in her hands which is since resolved.  She had some diaphoresis which is also resolved.  She denies any syncope or near syncope.  Denies any epigastric pain, nausea or vomiting.  Home Medications Prior to Admission medications   Medication Sig Start Date End Date Taking? Authorizing Provider  acetaminophen (TYLENOL) 325 MG tablet Take 650 mg by mouth every 6 (six) hours as needed for moderate pain.    [provider]  ALPRAZolam Prudy Feeler) 0.25 MG tablet Take 1 tablet (0.25 mg total) by mouth every 8 (eight) hours as needed for anxiety. 04/05/17   Harrington Challenger, NP  B Complex Vitamins (B COMPLEX 1 PO) Take 1 each by mouth 2 (two) times daily. sublingual    [provider]  Calcium Carbonate+Vitamin D 600-200 MG-UNIT TABS Take 1 tablet by mouth daily. 12/18/19   [provider]  denosumab (PROLIA) 60 MG/ML SOLN injection Inject 60 mg into the skin every 6 (six) months. Administer in upper arm, thigh, or abdomen 03/05/15   Harrington Challenger, NP  escitalopram (LEXAPRO) 20 MG tablet  Take 20 mg by mouth daily.    [provider]  Multiple Vitamins-Minerals (MULTIVITAMIN PO) Take 1 tablet by mouth 2 (two) times daily.     [provider]  Peppermint Oil 0.2 ML CPDR     [provider]  rOPINIRole (REQUIP XL) 2 MG 24 hr tablet ropinirole ER 2 mg tablet,extended release 24 hr  TAKE 1 TABLET BY MOUTH AT BEDTIME.    [provider]  warfarin (COUMADIN) 1 MG tablet TAKE 1/2 TO 1 TABLET BY MOUTH ONCE DAILY AS DIRECTED BY COUMADIN CLINIC 10/05/21   Quintella Reichert, MD      Allergies    Other, Warfarin sodium, and Sulfa antibiotics    Review of Systems   Review of Systems  Cardiovascular:  Positive for chest pain.  Musculoskeletal:  Positive for back pain.  All other systems reviewed and are negative.  Physical Exam Updated Vital Signs BP (!) 130/94   Pulse 66   Temp 97.9 F (36.6 C) (Oral)   Resp 16   Ht 5\' 2"  (1.575 m)   Wt 60.8 kg   SpO2 97%   BMI 24.51 kg/m  Physical Exam Vitals and nursing note reviewed.  Constitutional:      General: She is not in acute distress.    Appearance: She is well-developed.  HENT:     Head: Normocephalic and atraumatic.  Eyes:     Conjunctiva/sclera: Conjunctivae normal.  Neck:     Vascular: No JVD.  Cardiovascular:     Rate and Rhythm: Normal rate and regular rhythm.     Heart sounds: No murmur heard.    Comments: Pulses equal and symmetric bilaterally with 2+ radial pulses, no pulse deficit Pulmonary:     Effort: Pulmonary effort is normal. No respiratory distress.     Breath sounds: Normal breath sounds.  Chest:     Comments: No rash along the chest wall.   Abdominal:     Palpations: Abdomen is soft.     Tenderness: There is no abdominal tenderness.  Musculoskeletal:        General: No swelling.     Cervical back: Neck supple.     Comments: No rash along the back and no musculoskeletal tenderness.  Skin:    General: Skin is warm and dry.     Capillary Refill: Capillary refill  takes less than 2 seconds.  Neurological:     Mental Status: She is alert and oriented to person, place, and time.     Cranial Nerves: No cranial nerve deficit.     Motor: No weakness.     Comments: Intact strength and sensation in all 4 extremities with 5 out of 5 strength and intact sensation to light touch.  Psychiatric:        Mood and Affect: Mood normal.    ED Results / Procedures / Treatments   Labs (all labs ordered are listed, but only abnormal results are displayed) Labs Reviewed  BASIC METABOLIC PANEL - Abnormal; Notable for the following components:      Result Value   Glucose, Bld 103 (*)    Calcium 8.7 (*)    All other components within normal limits  PROTIME-INR - Abnormal; Notable for the following components:   Prothrombin Time 22.9 (*)    INR 2.0 (*)    All other components within normal limits  RESP PANEL BY RT-PCR (FLU A&B, COVID) ARPGX2  CBC  D-DIMER, QUANTITATIVE  TSH  T4, FREE  CBG MONITORING, ED  TROPONIN I (HIGH SENSITIVITY)  TROPONIN I (HIGH SENSITIVITY)    EKG EKG Interpretation  Date/Time:  Sunday October 17 2021 12:54:12 EDT Ventricular Rate:  58 PR Interval:  138 QRS Duration: 100 QT Interval:  388 QTC Calculation: 380 R Axis:   68 Text Interpretation: Sinus bradycardia Septal infarct , age undetermined Abnormal ECG When compared with ECG of 14-Nov-2020 14:39, PREVIOUS ECG IS PRESENT No significant change since last tracing Confirmed by Ernie Avena ([id number]) on 10/17/2021 2:43:19 PM  Radiology DG Chest 2 View  Result Date: 10/17/2021 CLINICAL DATA:  Chest pain EXAM: CHEST - 2 VIEW COMPARISON:  Chest x-ray 11/16/2017 FINDINGS: Heart size and mediastinal contours are within normal limits. Lungs are mildly hyperinflated. No suspicious pulmonary opacities identified. No pleural effusion or pneumothorax visualized. No acute osseous abnormality appreciated. IMPRESSION: No acute intrathoracic process identified. Electronically Signed   By: Jannifer Hick M.D.   On: 10/17/2021 13:18    Procedures Procedures    Medications Ordered in ED Medications  lidocaine (LIDODERM) 5 % 1 patch (1 patch Transdermal Patient Refused/Not Given 10/17/21 1757)  alum & mag hydroxide-simeth (MAALOX/MYLANTA) 200-200-20 MG/5ML suspension 30 mL (30 mLs Oral Given 10/17/21 1502)    And  lidocaine (XYLOCAINE) 2 % viscous mouth solution 15 mL (15 mLs Oral Given 10/17/21 1502)    ED Course/ Medical Decision Making/ A&P Clinical Course as of 10/17/21 2206  Sun Oct 17, 2021  1522 Pendign delta and dimer, CP [MK]    Clinical Course User Index [MK] Kommor, [place], MD                           Medical Decision Making Amount and/or Complexity of Data Reviewed Labs: ordered. Radiology: ordered.  Risk OTC drugs. Prescription drug management.   76 year old female presenting to the emergency department with a chief complaint of chest pain.  The patient states that she has had pain and discomfort in the left side of her chest with associated radiation to her left back, specifically her left shoulder blade.  She denies any shortness of breath.  She states that she has some associated indigestion discomfort that improves while burping.  She states that her discomfort improves after burping.  She denies any radiation of the pain to her shoulder, arm or jaw.  She denies any chest pressure.  She states it is more of a dull pain and tightness.  She endorses bilateral tingling in her hands which is since resolved.  She had some diaphoresis which is also resolved.  She denies any syncope or near syncope.  Denies any epigastric pain, nausea or vomiting.  On arrival, the patient was afebrile, mildly elevated temperature to 99.4, hemodynamically stable, saturating well on room air.  Sinus rhythm noted on cardiac telemetry.  Initial EKG revealed sinus bradycardia with ventricular rate of 5 8, no acute ischemic changes noted.  A chest x-ray was performed which revealed no  acute intrathoracic process.  Reviewed by myself and showed made no mediastinal widening.  The patient has left-sided chest pain that radiates to her left scapula.  Considered aortic dissection with the patient is currently no pulse deficit, is well-appearing with no sharp ripping or tearing component.  She does have a known history of hiatal hernia and endorses that burping makes her discomfort improved some.  Symptoms could be due to hiatal hernia.  Due to the patient's high risk, will rule out ACS with delta troponins.  Additionally will obtain D-dimer.  The patient's initial troponin resulted negative.  He had endorsed some fatigue recently and a TSH and free T4 were ordered and resulted negative.  COVID-19 and influenza PCR testing was collected and resulted negative.  A CBC was without anemia or leukocytosis.  The patient was administered Maalox and viscous lidocaine.  Plan at time of signout to follow-up the patient's laboratory testing and reassess the patient.  Signout given to Dr. Posey Rea at 1600.   Final Clinical Impression(s) / ED Diagnoses Final diagnoses:  Atypical chest pain    Rx / DC Orders ED Discharge Orders     None         Ernie Avena, MD 10/17/21 2206

## 2021-10-19 ENCOUNTER — Ambulatory Visit: Payer: PPO | Admitting: Obstetrics and Gynecology

## 2021-10-19 LAB — SURGICAL PATHOLOGY

## 2021-10-20 DIAGNOSIS — F339 Major depressive disorder, recurrent, unspecified: Secondary | ICD-10-CM | POA: Diagnosis not present

## 2021-10-20 DIAGNOSIS — F419 Anxiety disorder, unspecified: Secondary | ICD-10-CM | POA: Diagnosis not present

## 2021-10-20 DIAGNOSIS — R7303 Prediabetes: Secondary | ICD-10-CM | POA: Diagnosis not present

## 2021-10-20 DIAGNOSIS — M797 Fibromyalgia: Secondary | ICD-10-CM | POA: Diagnosis not present

## 2021-10-20 DIAGNOSIS — Z87891 Personal history of nicotine dependence: Secondary | ICD-10-CM | POA: Diagnosis not present

## 2021-10-20 DIAGNOSIS — I700 Atherosclerosis of aorta: Secondary | ICD-10-CM | POA: Diagnosis not present

## 2021-10-20 DIAGNOSIS — E042 Nontoxic multinodular goiter: Secondary | ICD-10-CM | POA: Diagnosis not present

## 2021-11-03 ENCOUNTER — Ambulatory Visit (INDEPENDENT_AMBULATORY_CARE_PROVIDER_SITE_OTHER): Payer: PPO

## 2021-11-03 DIAGNOSIS — Z5181 Encounter for therapeutic drug level monitoring: Secondary | ICD-10-CM | POA: Diagnosis not present

## 2021-11-03 LAB — POCT INR: INR: 2.9 (ref 2.0–3.0)

## 2021-11-03 NOTE — Patient Instructions (Signed)
Description   ?Continue taking Warfarin 1 tablet daily except for 1/2 tablet on Sundays, Tuesdays, and Thursdays. Recheck INR in 6 weeks.  ?Coumadin Clinic [phone].  ?  ?   ?

## 2021-11-10 DIAGNOSIS — Z4789 Encounter for other orthopedic aftercare: Secondary | ICD-10-CM | POA: Diagnosis not present

## 2021-11-10 DIAGNOSIS — M79644 Pain in right finger(s): Secondary | ICD-10-CM | POA: Diagnosis not present

## 2021-11-10 NOTE — Progress Notes (Signed)
GYNECOLOGY  VISIT ?  ?HPI: ?76 y.o.   Single  Caucasian  female   ?G0P0 with No LMP recorded. Patient has had a hysterectomy.   ?here for pelvic ultrasound for history of ovarian cancer.  ? ?Normal CA125 with level of 4 on 09/27/21.  ? ?Had vaginal bleeding after completion of the pelvic ultrasound, but not prior to this. ? ?She just had a colposcopy done 10/13/21 for pap showing benign glandular cells.  ?She has a history of CIN III and DES exposure along with prior hysterectomy.  ?Her colposcopic biopsy of the vaginal cuff showed benign fibroepithelial polyp.  ? ?She does take coumadin and has recurrent DVT.  ?She has her INR checked about once every 6 weeks.  ?It was last checked about 1 week ago.  ?Dr. Marin Olp is following the patient.  ? ?Patient may switch from Lexapro to Cymbalta for depression.  ?She was on Lexapro 20 mg and went down to 10 mg and felt better.  ?She feels her mood and depression are ok on this lower dosage.  ?The Cymbalta is hard to swallow, and it is a capsule.  She has not started it yet.  ? ?GYNECOLOGIC HISTORY: ?No LMP recorded. Patient has had a hysterectomy. ?Contraception:  Hyst ?Menopausal hormone therapy:  None ?Last mammogram:  01-20-21 Neg/BiRads1 ?Last pap smear:  09-27-21 benign gladular cells:Neg HR HPV,  08-19-20 Neg, 07-03-19 Neg ?       ?OB History   ? ? Gravida  ?0  ? Para  ?0  ? Term  ?   ? Preterm  ?   ? AB  ?   ? Living  ?   ?  ? ? SAB  ?   ? IAB  ?   ? Ectopic  ?   ? Multiple  ?   ? Live Births  ?   ?   ?  ?  ?    ? ?Patient Active Problem List  ? Diagnosis Date Noted  ? Encounter for therapeutic drug monitoring 02/02/2021  ? Acute deep vein thrombosis (DVT) of right lower extremity, unspecified vein ([place]) 01/25/2021  ? Aortic atherosclerosis ([place]) 11/17/2020  ? Acute DVT (deep venous thrombosis) ([place]) 11/13/2020  ? Depression with anxiety 11/13/2020  ? RLS (restless legs syndrome) 01/07/2020  ? Insomnia due to medical condition 01/07/2020  ? Nocturia more than twice per  night 01/07/2020  ? Bradycardia 12/14/2017  ? Abnormal EKG   ? GERD (gastroesophageal reflux disease)   ? Dyspnea on exertion 10/30/2017  ? Family history of ovarian cancer 04/17/2017  ? Family history of breast cancer 04/17/2017  ? Family history of colon cancer 04/17/2017  ? Family history of pancreatic cancer 04/17/2017  ? Family history of melanoma 04/17/2017  ? Genetic testing 04/17/2017  ? CIN III (cervical intraepithelial neoplasia III)   ? Arthritis   ? Fibromyalgia   ? PE (pulmonary embolism)   ? Thyroid disease   ? Osteoporosis   ? DVT (deep venous thrombosis) ([place]) 04/01/2004  ? ? ?Past Medical History:  ?Diagnosis Date  ? Abnormal EKG   ? Anxiety   ? Arthritis   ? oa  ? Bradycardia 12/14/2017  ? CIN III (cervical intraepithelial neoplasia III) 2000  ? Complication of anesthesia   ? did well last 2 times with procedures  ? Depression   ? DES exposure in utero   ? DVT (deep venous thrombosis) ([place]) 01/2009  ? LEFT LEG  ? DVT (deep venous thrombosis) ([facility])  10/30/2020  ? Dyspnea on exertion 10/2017  ? Elevated triglycerides with high cholesterol   ? Endometriosis   ? Fibromyalgia   ? GERD (gastroesophageal reflux disease)   ? History of colon polyps   ? History of hiatal hernia   ? told by some md has, some say not  ? IBS (irritable bowel syndrome) 2015  ? Left bundle branch block   ? intermittent  ? Lupus anticoagulant disorder ([place])   ? Multinodular goiter   ? Osteoporosis 12/2017  ? T score -3.2 stable from prior study  ? PE (pulmonary embolism)   ? Pneumonia 03/01/2021  ? PONV (postoperative nausea and vomiting)   ? Restless legs syndrome   ? Sinus bradycardia   ? Sleep disorder   ? Thyroid disease   ? HYPERTHYROIDISM  ? Vitamin D deficiency   ? ? ?Past Surgical History:  ?Procedure Laterality Date  ? ABDOMINAL HYSTERECTOMY  2001  ? TAH, partial  ? APPENDECTOMY  1978  ? COLONOSCOPY WITH PROPOFOL N/A 12/07/2015  ? Procedure: COLONOSCOPY WITH PROPOFOL;  Surgeon: Garlan Fair, MD;  Location: [facility]  ENDOSCOPY;  Service: Endoscopy;  Laterality: N/A;  ? colonscopy  7 yrs ago  ? other in past  ? ESOPHAGOGASTRODUODENOSCOPY (EGD) WITH PROPOFOL N/A 12/07/2015  ? Procedure: ESOPHAGOGASTRODUODENOSCOPY (EGD) WITH PROPOFOL;  Surgeon: Garlan Fair, MD;  Location: [facility] ENDOSCOPY;  Service: Endoscopy;  Laterality: N/A;  ? ESOPHAGOGASTRODUODENOSCOPY ENDOSCOPY  yrs ago  ? FACIAL COSMETIC SURGERY    ? GYNECOLOGIC CRYOSURGERY    ? MYOMECTOMY    ? REPLACEMENT TOTAL JOINT WRIST W/ PROSTHETIC IMPLANT Right 08/01/2021  ? TONSILLECTOMY    ? ? ?Current Outpatient Medications  ?Medication Sig Dispense Refill  ? acetaminophen (TYLENOL) 325 MG tablet Take 650 mg by mouth every 6 (six) hours as needed for moderate pain.    ? ALPRAZolam (XANAX) 0.25 MG tablet Take 1 tablet (0.25 mg total) by mouth every 8 (eight) hours as needed for anxiety. 30 tablet 0  ? B Complex Vitamins (B COMPLEX 1 PO) Take 1 each by mouth 2 (two) times daily. sublingual    ? Calcium Carbonate+Vitamin D 600-200 MG-UNIT TABS Take 1 tablet by mouth daily.    ? denosumab (PROLIA) 60 MG/ML SOLN injection Inject 60 mg into the skin every 6 (six) months. Administer in upper arm, thigh, or abdomen 180 mL 2  ? DULoxetine (CYMBALTA) 60 MG capsule Take 60 mg by mouth daily.    ? escitalopram (LEXAPRO) 20 MG tablet Take 20 mg by mouth daily.    ? Multiple Vitamins-Minerals (MULTIVITAMIN PO) Take 1 tablet by mouth 2 (two) times daily.     ? Peppermint Oil 0.2 ML CPDR     ? rOPINIRole (REQUIP XL) 2 MG 24 hr tablet ropinirole ER 2 mg tablet,extended release 24 hr ? TAKE 1 TABLET BY MOUTH AT BEDTIME.    ? warfarin (COUMADIN) 1 MG tablet TAKE 1/2 TO 1 TABLET BY MOUTH ONCE DAILY AS DIRECTED BY COUMADIN CLINIC 30 tablet 3  ? ?No current facility-administered medications for this visit.  ?  ? ?ALLERGIES: Other, Warfarin sodium, and Sulfa antibiotics ? ?Family History  ?Problem Relation Age of Onset  ? Hypertension Mother   ? Breast cancer Mother   ?     30's  ? Cancer Father   ?      COLON  ? Hypertension Sister   ? Stroke Sister   ? Breast cancer Maternal Aunt   ?  Age 76's  ? Cancer Maternal Aunt   ?     Melanoma  ? Alzheimer's disease Maternal Aunt   ? Cancer Paternal Aunt   ?     OVARIAN and COLON  ? Breast cancer Maternal Aunt   ?     42's  ? Cancer Maternal Aunt   ?     Colon CA  ? Alzheimer's disease Maternal Aunt   ? ? ?Social History  ? ?Socioeconomic History  ? Marital status: Single  ?  Spouse name: Not on file  ? Number of children: Not on file  ? Years of education: Not on file  ? Highest education level: Not on file  ?Occupational History  ? Not on file  ?Tobacco Use  ? Smoking status: Former  ?  Packs/day: 0.10  ?  Years: 6.00  ?  Pack years: 0.60  ?  Types: Cigarettes  ?  Quit date: 08/01/1978  ?  Years since quitting: 43.3  ? Smokeless tobacco: Never  ? Tobacco comments:  ?  only smoked 4-6 yrs 1 pack per month  ?Vaping Use  ? Vaping Use: Never used  ?Substance and Sexual Activity  ? Alcohol use: Yes  ?  Comment: 1 drink per month  ? Drug use: No  ? Sexual activity: Not Currently  ?  Birth control/protection: Surgical  ?  Comment: HYSTERECTOMY-1st intercourse 27-Fewer than 5 partners  ?Other Topics Concern  ? Not on file  ?Social History Narrative  ? Not on file  ? ?Social Determinants of Health  ? ?Financial Resource Strain: Not on file  ?Food Insecurity: Not on file  ?Transportation Needs: Not on file  ?Physical Activity: Not on file  ?Stress: Not on file  ?Social Connections: Not on file  ?Intimate Partner Violence: Not on file  ? ? ?Review of Systems  ?All other systems reviewed and are negative. ? ?PHYSICAL EXAMINATION:   ? ?BP 140/64   Pulse 66   Ht '5\' 1"'$  (1.549 m)   Wt 134 lb (60.8 kg)   SpO2 98%   BMI 25.32 kg/m?     ?General appearance: alert, cooperative and appears stated age ?  ?Pelvic: External genitalia:  blood stained vulva. ?             Urethra:  normal appearing urethra with no masses, tenderness or lesions ?             Bartholins and Skenes: normal    ?              Vagina: vaginal cuff with ecchymoses and significant atrophy. No laceration noted.  ?             Cervix:  absent ?               ?Bimanual Exam:  Uterus:  absent ?             Adnexa: no mass, fullness,

## 2021-11-11 ENCOUNTER — Encounter: Payer: Self-pay | Admitting: Obstetrics and Gynecology

## 2021-11-11 ENCOUNTER — Other Ambulatory Visit: Payer: PPO | Admitting: Obstetrics and Gynecology

## 2021-11-11 ENCOUNTER — Ambulatory Visit (INDEPENDENT_AMBULATORY_CARE_PROVIDER_SITE_OTHER): Payer: PPO | Admitting: Obstetrics and Gynecology

## 2021-11-11 ENCOUNTER — Ambulatory Visit (INDEPENDENT_AMBULATORY_CARE_PROVIDER_SITE_OTHER): Payer: PPO

## 2021-11-11 VITALS — BP 140/64 | HR 66 | Ht 61.0 in | Wt 134.0 lb

## 2021-11-11 DIAGNOSIS — N939 Abnormal uterine and vaginal bleeding, unspecified: Secondary | ICD-10-CM | POA: Diagnosis not present

## 2021-11-11 DIAGNOSIS — Z8041 Family history of malignant neoplasm of ovary: Secondary | ICD-10-CM

## 2021-11-11 DIAGNOSIS — F32A Depression, unspecified: Secondary | ICD-10-CM

## 2021-11-12 ENCOUNTER — Encounter: Payer: Self-pay | Admitting: Hematology & Oncology

## 2021-11-12 ENCOUNTER — Inpatient Hospital Stay: Payer: PPO | Attending: Hematology & Oncology

## 2021-11-12 ENCOUNTER — Other Ambulatory Visit: Payer: Self-pay

## 2021-11-12 ENCOUNTER — Inpatient Hospital Stay (HOSPITAL_BASED_OUTPATIENT_CLINIC_OR_DEPARTMENT_OTHER): Payer: PPO | Admitting: Hematology & Oncology

## 2021-11-12 VITALS — BP 93/48 | HR 52 | Temp 98.2°F | Resp 18 | Wt 135.0 lb

## 2021-11-12 DIAGNOSIS — I82402 Acute embolism and thrombosis of unspecified deep veins of left lower extremity: Secondary | ICD-10-CM | POA: Insufficient documentation

## 2021-11-12 DIAGNOSIS — Z79899 Other long term (current) drug therapy: Secondary | ICD-10-CM | POA: Diagnosis not present

## 2021-11-12 DIAGNOSIS — D069 Carcinoma in situ of cervix, unspecified: Secondary | ICD-10-CM | POA: Diagnosis not present

## 2021-11-12 DIAGNOSIS — I82401 Acute embolism and thrombosis of unspecified deep veins of right lower extremity: Secondary | ICD-10-CM

## 2021-11-12 DIAGNOSIS — M797 Fibromyalgia: Secondary | ICD-10-CM | POA: Insufficient documentation

## 2021-11-12 DIAGNOSIS — Z7901 Long term (current) use of anticoagulants: Secondary | ICD-10-CM | POA: Diagnosis not present

## 2021-11-12 DIAGNOSIS — D6862 Lupus anticoagulant syndrome: Secondary | ICD-10-CM | POA: Diagnosis not present

## 2021-11-12 LAB — CBC WITH DIFFERENTIAL (CANCER CENTER ONLY)
Abs Immature Granulocytes: 0.02 10*3/uL (ref 0.00–0.07)
Basophils Absolute: 0.1 10*3/uL (ref 0.0–0.1)
Basophils Relative: 1 %
Eosinophils Absolute: 0.2 10*3/uL (ref 0.0–0.5)
Eosinophils Relative: 4 %
HCT: 43.6 % (ref 36.0–46.0)
Hemoglobin: 14.2 g/dL (ref 12.0–15.0)
Immature Granulocytes: 0 %
Lymphocytes Relative: 23 %
Lymphs Abs: 1.4 10*3/uL (ref 0.7–4.0)
MCH: 30.9 pg (ref 26.0–34.0)
MCHC: 32.6 g/dL (ref 30.0–36.0)
MCV: 94.8 fL (ref 80.0–100.0)
Monocytes Absolute: 0.5 10*3/uL (ref 0.1–1.0)
Monocytes Relative: 8 %
Neutro Abs: 3.9 10*3/uL (ref 1.7–7.7)
Neutrophils Relative %: 64 %
Platelet Count: 238 10*3/uL (ref 150–400)
RBC: 4.6 MIL/uL (ref 3.87–5.11)
RDW: 13.9 % (ref 11.5–15.5)
WBC Count: 6.1 10*3/uL (ref 4.0–10.5)
nRBC: 0 % (ref 0.0–0.2)

## 2021-11-12 LAB — CMP (CANCER CENTER ONLY)
ALT: 14 U/L (ref 0–44)
AST: 20 U/L (ref 15–41)
Albumin: 3.9 g/dL (ref 3.5–5.0)
Alkaline Phosphatase: 71 U/L (ref 38–126)
Anion gap: 6 (ref 5–15)
BUN: 21 mg/dL (ref 8–23)
CO2: 31 mmol/L (ref 22–32)
Calcium: 9 mg/dL (ref 8.9–10.3)
Chloride: 104 mmol/L (ref 98–111)
Creatinine: 0.75 mg/dL (ref 0.44–1.00)
GFR, Estimated: >60 mL/min (ref >60)
Glucose, Bld: 109 mg/dL — ABNORMAL HIGH (ref 70–99)
Potassium: 4.1 mmol/L (ref 3.5–5.1)
Sodium: 141 mmol/L (ref 135–145)
Total Bilirubin: 0.4 mg/dL (ref 0.3–1.2)
Total Protein: 6.5 g/dL (ref 6.5–8.1)

## 2021-11-12 LAB — D-DIMER, QUANTITATIVE: D-Dimer, Quant: <0.27 ug/mL-FEU (ref 0.00–0.50)

## 2021-11-12 NOTE — Progress Notes (Signed)
?Hematology and Oncology Follow Up Visit ? ?Emma Lopez ?[id number] ?14-Dec-1945 76 y.o. ?11/12/2021 ? ? ?Principle Diagnosis:  ?Recurrent DVT of the left leg -- probable progression ?Positive lupus anticoagulant ? ?Current Therapy:   ?Xarelto 20 mg p.o. daily -- d/c on 01/06/2021 ?Pradaxa 150 mg po BID - start on 01/06/2021 - d/c on 01/25/2021 ?Coumadin  -- dosed by Coumadin Clinic -- keep INR 2.5-3.5 ?    ?Interim History:  Emma Lopez is in for follow-up.  So far, she is doing pretty well.  She has gone through the surgery that she had on the right arm.  I think she had the surgery back in early January.  She had no problems with bleeding. ? ?She is doing well on the Coumadin.  The Coumadin clinic is monitoring her INR. ? ?She has had no issues with cough or shortness of breath.  She did go to the emergency room recently.  She was having some discomfort under the left breast area.  She had an evaluation that was pretty much unremarkable. ? ?She has had no bleeding.  She has had no change in bowel or bladder habits.  There is been no leg swelling. ? ?She does have little bit of fibromyalgia.  She is on Lexapro.  This seems to be agreeing with her. ? ?Overall, I would say performance status is ECOG 1. ? ? ?Medications:  ?Current Outpatient Medications:  ?  ALPRAZolam (XANAX) 0.25 MG tablet, Take 1 tablet (0.25 mg total) by mouth every 8 (eight) hours as needed for anxiety., Disp: 30 tablet, Rfl: 0 ?  B Complex Vitamins (B COMPLEX 1 PO), Take 1 each by mouth 2 (two) times daily. sublingual, Disp: , Rfl:  ?  Calcium Carbonate+Vitamin D 600-200 MG-UNIT TABS, Take 1 tablet by mouth daily., Disp: , Rfl:  ?  denosumab (PROLIA) 60 MG/ML SOLN injection, Inject 60 mg into the skin every 6 (six) months. Administer in upper arm, thigh, or abdomen, Disp: 180 mL, Rfl: 2 ?  DULoxetine (CYMBALTA) 60 MG capsule, Take 60 mg by mouth daily. (Patient not taking: Reported on 11/12/2021), Disp: , Rfl:  ?  escitalopram (LEXAPRO) 10 MG  tablet, 20 mg daily. Take 10 mg daily, Disp: , Rfl:  ?  Multiple Vitamins-Minerals (MULTIVITAMIN PO), Take 1 tablet by mouth 2 (two) times daily. , Disp: , Rfl:  ?  Peppermint Oil 0.2 ML CPDR, , Disp: , Rfl:  ?  rOPINIRole (REQUIP XL) 2 MG 24 hr tablet, ropinirole ER 2 mg tablet,extended release 24 hr  TAKE 1 TABLET BY MOUTH AT BEDTIME., Disp: , Rfl:  ?  warfarin (COUMADIN) 1 MG tablet, TAKE 1/2 TO 1 TABLET BY MOUTH ONCE DAILY AS DIRECTED BY COUMADIN CLINIC, Disp: 30 tablet, Rfl: 3 ? ?Allergies:  ?Allergies  ?Allergen Reactions  ? Other Other (See Comments)  ? Warfarin Sodium   ?  Other reaction(s): rash (name brand coumadin is ok)  ? Sulfa Antibiotics Rash and Other (See Comments)  ? ? ?Past Medical History, Surgical history, Social history, and Family History were reviewed and updated. ? ?Review of Systems: ?Review of Systems  ?Constitutional: Negative.   ?HENT:  Negative.    ?Eyes: Negative.   ?Respiratory: Negative.    ?Cardiovascular: Negative.   ?Gastrointestinal: Negative.   ?Endocrine: Negative.   ?Genitourinary: Negative.    ?Musculoskeletal: Negative.   ?Skin: Negative.   ?Neurological: Negative.   ?Hematological: Negative.   ?Psychiatric/Behavioral: Negative.    ? ?Physical Exam: ? weight is 135  lb (61.2 kg). Her oral temperature is 98.2 ?F (36.8 ?C). Her blood pressure is 93/48 (abnormal) and her pulse is 52 (abnormal). Her respiration is 18 and oxygen saturation is 100%.  ? ?Wt Readings from Last 3 Encounters:  ?11/12/21 135 lb (61.2 kg)  ?11/11/21 134 lb (60.8 kg)  ?10/17/21 134 lb (60.8 kg)  ? ? ?Physical Exam ?Vitals reviewed.  ?HENT:  ?   Head: Normocephalic and atraumatic.  ?Eyes:  ?   Pupils: Pupils are equal, round, and reactive to light.  ?Cardiovascular:  ?   Rate and Rhythm: Normal rate and regular rhythm.  ?   Heart sounds: Normal heart sounds.  ?Pulmonary:  ?   Effort: Pulmonary effort is normal.  ?   Breath sounds: Normal breath sounds.  ?Abdominal:  ?   General: Bowel sounds are normal.  ?    Palpations: Abdomen is soft.  ?Musculoskeletal:     ?   General: No tenderness or deformity. Normal range of motion.  ?   Cervical back: Normal range of motion.  ?   Comments: Her lower extremities show some swelling in the left lower leg.  There is nonpitting edema in the left ankle.  She has good pulses in the distal extremities.  No obvious venous cord is noted in the legs.  ?Lymphadenopathy:  ?   Cervical: No cervical adenopathy.  ?Skin: ?   General: Skin is warm and dry.  ?   Findings: No erythema or rash.  ?Neurological:  ?   Mental Status: She is alert and oriented to person, place, and time.  ?Psychiatric:     ?   Behavior: Behavior normal.     ?   Thought Content: Thought content normal.     ?   Judgment: Judgment normal.  ? ? ? ?Lab Results  ?Component Value Date  ? WBC 6.1 11/12/2021  ? HGB 14.2 11/12/2021  ? HCT 43.6 11/12/2021  ? MCV 94.8 11/12/2021  ? PLT 238 11/12/2021  ? ?  Chemistry   ?   ?Component Value Date/Time  ? NA 141 11/12/2021 1043  ? NA 142 01/03/2018 1601  ? K 4.1 11/12/2021 1043  ? CL 104 11/12/2021 1043  ? CO2 31 11/12/2021 1043  ? BUN 21 11/12/2021 1043  ? BUN 20 01/03/2018 1601  ? CREATININE 0.75 11/12/2021 1043  ?    ?Component Value Date/Time  ? CALCIUM 9.0 11/12/2021 1043  ? ALKPHOS 71 11/12/2021 1043  ? AST 20 11/12/2021 1043  ? ALT 14 11/12/2021 1043  ? BILITOT 0.4 11/12/2021 1043  ?  ? ? ?Impression and Plan: ?Emma Lopez is a very charming 76 year old white female.  She has a history of a DVT.  She was on maintenance Xarelto.  She was on lifelong therapy.  Subsequently had a acute thrombus in the left leg.  She does have the lupus anticoagulant which I believe is pathologic. ? ?She will continue  on lifelong Coumadin.  Again, the Coumadin Clinic is doing a good job of monitoring her INR.  They are let us know of any adjustments that need to be made with her Coumadin dose. ? ?I do not see that we have to do any scans on her.  I do not see that we need any Dopplers. ? ?Hopefully,  she will be able to enjoy her [facility].  I think she may be going up there in May. ? ?We will plan to get her back to see [place] in another  3 months or so.    ? ?Volanda Napoleon, MD ?4/14/202311:25 AM ?

## 2021-11-13 ENCOUNTER — Other Ambulatory Visit: Payer: Self-pay | Admitting: Cardiology

## 2021-11-13 DIAGNOSIS — I824Z2 Acute embolism and thrombosis of unspecified deep veins of left distal lower extremity: Secondary | ICD-10-CM

## 2021-11-14 LAB — PTT-LA MIX: PTT-LA Mix: 34.8 s (ref 0.0–40.5)

## 2021-11-14 LAB — CARDIOLIPIN ANTIBODIES, IGG, IGM, IGA
Anticardiolipin IgA: <9 APL U/mL (ref 0–11)
Anticardiolipin IgG: <9 GPL U/mL (ref 0–14)
Anticardiolipin IgM: <9 MPL U/mL (ref 0–12)

## 2021-11-14 LAB — DRVVT MIX: dRVVT Mix: 44.2 s — ABNORMAL HIGH (ref 0.0–40.4)

## 2021-11-14 LAB — LUPUS ANTICOAGULANT PANEL
DRVVT: 98 s — ABNORMAL HIGH (ref 0.0–47.0)
PTT Lupus Anticoagulant: 49.6 s — ABNORMAL HIGH (ref 0.0–43.5)

## 2021-11-14 LAB — PTT-LA INCUB MIX: PTT-LA Incub Mix: 38.6 s (ref 0.0–40.5)

## 2021-11-14 LAB — DRVVT CONFIRM: dRVVT Confirm: 1.6 ratio — ABNORMAL HIGH (ref 0.8–1.2)

## 2021-11-16 ENCOUNTER — Encounter: Payer: Self-pay | Admitting: Neurology

## 2021-11-16 ENCOUNTER — Ambulatory Visit: Payer: PPO | Admitting: Neurology

## 2021-11-16 ENCOUNTER — Other Ambulatory Visit: Payer: Self-pay | Admitting: Gastroenterology

## 2021-11-16 VITALS — BP 153/70 | HR 54 | Ht 62.0 in | Wt 135.0 lb

## 2021-11-16 DIAGNOSIS — M797 Fibromyalgia: Secondary | ICD-10-CM | POA: Diagnosis not present

## 2021-11-16 DIAGNOSIS — I2782 Chronic pulmonary embolism: Secondary | ICD-10-CM

## 2021-11-16 DIAGNOSIS — R5382 Chronic fatigue, unspecified: Secondary | ICD-10-CM

## 2021-11-16 DIAGNOSIS — G2581 Restless legs syndrome: Secondary | ICD-10-CM

## 2021-11-16 DIAGNOSIS — R5381 Other malaise: Secondary | ICD-10-CM | POA: Insufficient documentation

## 2021-11-16 DIAGNOSIS — I471 Supraventricular tachycardia: Secondary | ICD-10-CM

## 2021-11-16 DIAGNOSIS — I82402 Acute embolism and thrombosis of unspecified deep veins of left lower extremity: Secondary | ICD-10-CM | POA: Diagnosis not present

## 2021-11-16 NOTE — Progress Notes (Signed)
? ? ?SLEEP MEDICINE CLINIC ?  ? ?Provider:  Larey Seat, MD  ?Primary Care Physician:  Michael Boston, MD ?[address] ?[place] [address]  ? ?  ?Referring Provider: Michael Boston, Md ?[address] ?[place],  [place] [address]  ?  ?  ?    ?Chief Complaint according to patient   ?Patient presents with:  ?  ? New Patient (Initial Visit)  ?   pt alone, rm 10. pt states that she has struggled with RLS. she states that in past [name] initially was placed on seroquel ( 2004) which helped at night but during the day was groggy. Requip at 1 mg at bedtime was no longer effective. ? she states that it bothersome to lay horizontal and it bothers during the daytime. she gets about 8 hrs of sleep, wakes up 3-4 times to void. She naps in daytime  last SS was shortly before she was seen at [facility] [place] 2003.  ?  ?  ?HISTORY OF PRESENT ILLNESS:  ?ETHELREDA SUKHU is a 76 y.o. year old White or Caucasian female patient seen here as a referral on 11/16/2021 from Pt has a low energy level. She has lost her female companion in January 2023 and her sister last year. She is lonely.  ?11-16-2021 Now her PCP is Dr Jacalyn Lefevre and the patient is much more fatigued rather than Sleepiness. ? She has always been a napper. She states that she used to be able to take a nap and feel refreshed. Now Anytime she lays horizontal her legs act up, RLS keeps he from sleeping or napping.  It can also happen in a recliner.  Her sleep is still fragmented by nocturia, 3 times on average. Her cat also wake her earlier than desired.  ?Last visit was with Debbora Presto, NP on 05-18-2021 and she advised to start the XL tab , but she was unable to swallow that. She has been working with pcp dr . Jacalyn Lefevre to discuss changing antidepressants ( has been on lexapro) . Now tried Cymbalta in late March. Sprinkles in capsule, she has to open the capsule.  ?While weaning off lexapro she started to feel better.  ? ?Her in lab sleep study from 12-19-2019 showed no apnea; The patient  described Restless legs as the main problem to initiate sleep, but Requip  seems to prevent translation into PLMs.  ?There was still a prolonged sleep latency noted and many limb movements during wakefulness.  ?I had felt then that intervention is not needed.  ? ?  ?Chief concern according to patient : My RLS/ PLMs may cause me to not rest well, I am tired all day.  ? ?I have the pleasure of seeing RUWEYDA MACKNIGHT today, a right-handed Caucasian female with an e unexplained  chronic  fatigue disorder.   ? ?She  has a past medical history of Abnormal EKG, Anxiety, Arthritis, Bradycardia (12/14/2017), CIN III (cervical intraepithelial neoplasia III) (0109), Complication of anesthesia, PLMs,  Nocturia, fatigue- Depression, DES exposure in utero, DVT (deep venous thrombosis) ([place]) (01/2009), Dyspnea on exertion (10/2017), Elevated triglycerides with high cholesterol, Endometriosis,GERD (gastroesophageal reflux disease), History of colon polyps, History of hiatal hernia, IBS (irritable bowel syndrome) (2015), Multinodular goiter, Osteoporosis (12/2017), PE (pulmonary embolism- 2010- after I had last seen her), PONV (postoperative nausea and vomiting), Restless legs syndrome,PLM sleep disorder, Thyroid disease, and Vitamin D deficiency.. ?  ?The patient had the first and last sleep study in the year 2003  with a result of PLM disorder - at [facility].   ?Sleep relevant medical history: Nocturia/ 3-4 ,DVT and PE in 2010 , LBBB diagnosed last year, Dr Golden Hurter- PVC on EKG, bradycardia.  ? ?My Plan is to proceed with: Mrs. Fedie never had a sleep study with me but presented in 2003 with a sleep study from [facility] which had a notably high amount of periodic limb movements.  In the meantime she just needed a medication that allowed her to go to sleep easily but she does not initially presenting with restless legs.  Now restless legs have become much more dominant that hindered her to take a nap and they also make sometimes  very soothing sleep pattern after one of her frequent bathroom breaks.  She has a history of DVT which could have been worsening her leg sensation and movement, at one time she suffered a pulmonary embolism related to a deep venous thrombosis in the leg this was around 2010 she has sometimes experienced a breathing difficulty on exhalation not inhalation.  She has been treated with vitamin D she is followed for osteoporosis Dr. Elyse Hsu had looked at her from the endocrinology standpoint she takes propranolol at night which has helped, she is no longer taking Seroquel which we have tried for her a decade ago with the intention to help her insomnia.  However it had extreme groggy making side effect for the following day. ?  ?  ?Social history:  Patient is retired  2003 from [employer], and lives in a household with   alone.  ?Family status is single, no children.  ?Pets are present-  a cat.  ?Tobacco use was an occasional smoker  .   ?ETOH use- seldomly , Caffeine intake in form of1-2 cups of hot tea.no energy drinks. ?Regular exercise in form of walking  ?Hobbies : yoga ? ?  ?  ?Sleep habits are as follows:  ?The patient's dinner time is between 6-7  PM. The patient goes to bed at 10 PM and continues to sleep for 2 hours, wakes for many  bathroom breaks. ?The preferred sleep position is mainly on her sides, with the support of  ?1 pillow.  ?Dreams are reportedly frequently. .  ?9 AM is the usual rise time. The patient wakes up spontaneously at 7.30 .  ?She reports  feeling refreshed and restored in AM, without symptoms such as dry mouth , morning headaches and only sometimes residual fatigue. ? Naps are taken infrequently, she wants to nap but RLS prevents her.  ?  ?Review of Systems: ?Out of a complete 14 system review, the patient complains of only the following symptoms, and all other reviewed systems are negative.:  ?Fatigue, sleepiness , snoring, fragmented sleep, Insomnia nocturia - and RLS . ?   ?How likely are you to doze in the following situations: ?0 = not likely, 1 = slight chance, 2 = moderate chance, 3 = high chance ?  ?Sitting and Reading? ?Watching Television? ?Sitting inactive in a public place (theater or meeting)? ?As a passenger in a car for an hour without a break? ?Lying down in the afternoon when circumstances permit? ?Sitting and talking to someone? ?Sitting quietly after lunch without alcohol? ?In a car, while stopped for a few minutes in traffic? ?  ?Total =2/ 24 points - Because legs don't let me -  ? FSS endorsed at  63 / 63 points.  ? ? ? ?Social History  ? ?Socioeconomic History  ?  Marital status: Single  ?  Spouse name: Not on file  ? Number of children: Not on file  ? Years of education: Not on file  ? Highest education level: Not on file  ?Occupational History  ? Not on file  ?Tobacco Use  ? Smoking status: Former  ?  Packs/day: 0.10  ?  Years: 6.00  ?  Pack years: 0.60  ?  Types: Cigarettes  ?  Quit date: 08/01/1978  ?  Years since quitting: 43.3  ? Smokeless tobacco: Never  ? Tobacco comments:  ?  only smoked 4-6 yrs 1 pack per month  ?Vaping Use  ? Vaping Use: Never used  ?Substance and Sexual Activity  ? Alcohol use: Yes  ?  Comment: 1 drink per month  ? Drug use: No  ? Sexual activity: Not Currently  ?  Birth control/protection: Surgical  ?  Comment: HYSTERECTOMY-1st intercourse 27-Fewer than 5 partners  ?Other Topics Concern  ? Not on file  ?Social History Narrative  ? Not on file  ? ?Social Determinants of Health  ? ?Financial Resource Strain: Not on file  ?Food Insecurity: Not on file  ?Transportation Needs: Not on file  ?Physical Activity: Not on file  ?Stress: Not on file  ?Social Connections: Not on file  ? ? ?Family History  ?Problem Relation Age of Onset  ? Hypertension Mother   ? Breast cancer Mother   ?     40's  ? Cancer Father   ?     COLON  ? Hypertension Sister   ? Stroke Sister   ? Breast cancer Maternal Aunt   ?     Age 19's  ? Cancer Maternal Aunt   ?      Melanoma  ? Alzheimer's disease Maternal Aunt   ? Cancer Paternal Aunt   ?     OVARIAN and COLON  ? Breast cancer Maternal Aunt   ?     100's  ? Cancer Maternal Aunt   ?     Colon CA  ? Alzheimer's disease Materna

## 2021-11-16 NOTE — Patient Instructions (Signed)
Myofascial Pain Syndrome and Fibromyalgia ?Myofascial pain syndrome and fibromyalgia are both pain disorders. You may feel this pain mainly in your muscles. ?Myofascial pain syndrome: ?Always has tender points in the muscles that will cause pain when pressed (trigger points). The pain may come and go. ?Usually affects your neck, upper back, and shoulder areas. The pain often moves into your arms and hands. ?Fibromyalgia: ?Has muscle pains and tenderness that come and go. ?Is often associated with tiredness (fatigue) and sleep problems. ?Has trigger points. ?Tends to be long-lasting (chronic), but is not life-threatening. ?Fibromyalgia and myofascial pain syndrome are not the same. However, they often occur together. If you have both conditions, each can make the other worse. Both are common and can cause enough pain and fatigue to make day-to-day activities difficult. Both can be hard to diagnose because their symptoms are common in many other conditions. ?What are the causes? ?The exact causes of these conditions are not known. ?What increases the risk? ?You are more likely to develop either of these conditions if: ?You have a family history of the condition. ?You are female. ?You have certain triggers, such as: ?Spine disorders. ?An injury (trauma) or other physical stressors. ?Being under a lot of stress. ?Medical conditions such as osteoarthritis, rheumatoid arthritis, or lupus. ?What are the signs or symptoms? ?Fibromyalgia ?The main symptom of fibromyalgia is widespread pain and tenderness in your muscles. Pain is sometimes described as stabbing, shooting, or burning. ?You may also have: ?Tingling or numbness. ?Sleep problems and fatigue. ?Problems with attention and concentration (fibro fog). ?Other symptoms may include: ?Bowel and bladder problems. ?Headaches. ?Vision problems. ?Sensitivity to odors and noises. ?Depression or mood changes. ?Painful menstrual periods (dysmenorrhea). ?Dry skin or eyes. ?These  symptoms can vary over time. ?Myofascial pain syndrome ?Symptoms of myofascial pain syndrome include: ?Tight, ropy bands of muscle. ?Uncomfortable sensations in muscle areas. These may include aching, cramping, burning, numbness, tingling, and weakness. ?Difficulty moving certain parts of the body freely (poor range of motion). ?How is this diagnosed? ?This condition may be diagnosed by your symptoms and medical history. You will also have a physical exam. In general: ?Fibromyalgia is diagnosed if you have pain, fatigue, and other symptoms for more than 3 months, and symptoms cannot be explained by another condition. ?Myofascial pain syndrome is diagnosed if you have trigger points in your muscles, and those trigger points are tender and cause pain elsewhere in your body (referred pain). ?How is this treated? ?Treatment for these conditions depends on the type that you have. ?For fibromyalgia a healthy lifestyle is the most important treatment including aerobic and strength exercises. Different types of medicines are used to help treat pain and include: ?NSAIDs. ?Medicines for treating depression. ?Medicines that help control seizures. ?Medicines that relax the muscles. ?Treatment for myofascial pain syndrome includes: ?Pain medicines, such as NSAIDs. ?Cooling and stretching of muscles. ?Massage therapy with myofascial release technique. ?Trigger point injections. ?Treating these conditions often requires a team of health care providers. These may include: ?Your primary care provider. ?A physical therapist. ?Complementary health care providers, such as massage therapists or acupuncturists. ?A psychiatrist for cognitive behavioral therapy. ?Follow these instructions at home: ?Medicines ?Take over-the-counter and prescription medicines only as told by your health care provider. ?Ask your health care provider if the medicine prescribed to you: ?Requires you to avoid driving or using machinery. ?Can cause constipation.  You may need to take these actions to prevent or treat constipation: ?Drink enough fluid to keep your urine pale   yellow. ?Take over-the-counter or prescription medicines. ?Eat foods that are high in fiber, such as beans, whole grains, and fresh fruits and vegetables. ?Limit foods that are high in fat and processed sugars, such as fried or sweet foods. ?Lifestyle ? ?Do exercises as told by your health care provider or physical therapist. ?Practice relaxation techniques to control your stress. You may want to try: ?Biofeedback. ?Visual imagery. ?Hypnosis. ?Muscle relaxation. ?Yoga. ?Meditation. ?Maintain a healthy lifestyle. This includes eating a healthy diet and getting enough sleep. ?Do not use any products that contain nicotine or tobacco. These products include cigarettes, chewing tobacco, and vaping devices, such as e-cigarettes. If you need help quitting, ask your health care provider. ?General instructions ?Talk to your health care provider about complementary treatments, such as acupuncture or massage. ?Do not do activities that stress or strain your muscles. This includes repetitive motions and heavy lifting. ?Keep all follow-up visits. This is important. ?Where to find support ?Consider joining a support group with others who are diagnosed with this condition. ?National Fibromyalgia Association: www.fmaware.org ?Where to find more information ?American Chronic Pain Association: www.theacpa.org ?Contact a health care provider if: ?You have new symptoms. ?Your symptoms get worse or your pain is severe. ?You have side effects from your medicines. ?You have trouble sleeping. ?Your condition is causing depression or anxiety. ?Get help right away if: ?You have thoughts of hurting yourself or others. ?Get help right awayif you feel like you may hurt yourself or others, or have thoughts about taking your own life. Go to your nearest emergency room or: ?Call 911. ?Call the [facility] at  [phone] or [phone]. This is open 24 hours a day. ?Text the Crisis Text Line at [phone]. ?Summary ?Myofascial pain syndrome and fibromyalgia are pain disorders. ?Myofascial pain syndrome has tender points in the muscles that will cause pain when pressed (trigger points). Fibromyalgia also has muscle pains and tenderness that come and go, but this condition is often associated with fatigue and sleep disturbances. ?Fibromyalgia and myofascial pain syndrome are not the same but often occur together, causing pain and fatigue that make day-to-day activities difficult. ?Follow your health care provider's instructions for taking medicines and maintaining a healthy lifestyle. ?This information is not intended to replace advice given to you by your health care provider. Make sure you discuss any questions you have with your health care provider. ?Document Revised: 06/18/2021 Document Reviewed: 06/18/2021 ?Elsevier Patient Education ? [place]. ?Fatigue ?If you have fatigue, you feel tired all the time and have a lack of energy or a lack of motivation. Fatigue may make it difficult to start or complete tasks because of exhaustion. ?Occasional or mild fatigue is often a normal response to activity or life. However, long-term (chronic) or extreme fatigue may be a symptom of a medical condition such as: ?Depression. ?Not having enough red blood cells or hemoglobin in the blood (anemia). ?A problem with a small gland located in the lower front part of the neck (thyroid disorder). ?Rheumatologic conditions. These are problems related to the body's defense system (immune system). ?Infections, especially certain viral infections. ?Fatigue can also lead to negative health outcomes over time. ?Follow these instructions at home: ?Medicines ?Take over-the-counter and prescription medicines only as told by your health care provider. ?Take a multivitamin if told by your health care provider. ?Do not use herbal or dietary  supplements unless they are approved by your health care provider. ?Eating and drinking ? ?Avoid heavy meals in the evening. ?Eat  a well-balanced diet, which includes lean proteins, whole grains, plenty of frui

## 2021-11-25 ENCOUNTER — Telehealth: Payer: Self-pay

## 2021-11-25 NOTE — Telephone Encounter (Signed)
Request to hold warfarin (recurrent DVT/PE), Coumadin followed by our office. ?

## 2021-11-25 NOTE — Telephone Encounter (Signed)
? ?  Pre-operative Risk Assessment  ?  ?Patient Name: Emma Lopez  ?DOB: 02/28/1946 ?MRN: [id number]  ? ?  ? ?Request for Surgical Clearance   ? ?Procedure:   COLONOSCOPY ? ?Date of Surgery:  Clearance 01/11/22                              ?   ?Surgeon:  DR. KARKI ?Surgeon's Group or Practice Name:  [facility] ?Phone number:  [phone] ?Fax number:  [phone] ?  ?Type of Clearance Requested:   ?- Pharmacy:  Hold Warfarin (Coumadin) NEEDS INSTRUCTIONS  ?  ?Type of Anesthesia:   PROPOFOL ?  ?Additional requests/questions:   ? ?Signed, ?Jacinta Shoe   ?11/25/2021, 3:03 PM  ? ?

## 2021-11-25 NOTE — Telephone Encounter (Signed)
? ? ?  Name: Emma Lopez  ?DOB: 11-Jun-1946  ?MRN: [id number] ? ?Primary Cardiologist: Fransico Him, MD ? ? ?Preoperative team, please contact this patient and set up a phone call appointment for further preoperative risk assessment. Please obtain consent and complete medication review. Thank you for your help. ? ?I confirm that guidance regarding antiplatelet and oral anticoagulation therapy has been completed and, if necessary, noted below. ? ? ? ?Christell Faith, PA-C ?11/25/2021, 4:08 PM ?[facility] ?[address] ?[place], [place] [address] ? ? ?

## 2021-11-25 NOTE — Telephone Encounter (Signed)
Patient with diagnosis of recurrent DVT/PE, positive lupus anticoagulant, and prior DVT on Xarelto, now on warfarin for anticoagulation.   ? ?Procedure: colonoscopy ?Date of procedure: 01/11/22 ? ?CrCl 74m/min ?Platelet count 238K ? ?Per office protocol, patient can hold warfarin for 5 days prior to procedure. Patient will need bridging with Lovenox (enoxaparin) around procedure. [facility] Bridge will be coordinated at upcoming INR check. ?

## 2021-11-26 ENCOUNTER — Telehealth: Payer: Self-pay | Admitting: *Deleted

## 2021-11-26 NOTE — Telephone Encounter (Signed)
?  Patient Consent for Virtual Visit  ? ? ?   ? ?Emma Lopez has provided verbal consent on 11/26/2021 for a virtual visit (video or telephone). ? ? ?CONSENT FOR VIRTUAL VISIT FOR:  Emma Lopez  ?By participating in this virtual visit I agree to the following: ? ?I hereby voluntarily request, consent and authorize [facility] and its employed or contracted physicians, physician assistants, nurse practitioners or other licensed health care professionals (the Practitioner), to provide me with telemedicine health care services (the ?Services") as deemed necessary by the treating Practitioner. I acknowledge and consent to receive the Services by the Practitioner via telemedicine. I understand that the telemedicine visit will involve communicating with the Practitioner through live audiovisual communication technology and the disclosure of certain medical information by electronic transmission. I acknowledge that I have been given the opportunity to request an in-person assessment or other available alternative prior to the telemedicine visit and am voluntarily participating in the telemedicine visit. ? ?I understand that I have the right to withhold or withdraw my consent to the use of telemedicine in the course of my care at any time, without affecting my right to future care or treatment, and that the Practitioner or I may terminate the telemedicine visit at any time. I understand that I have the right to inspect all information obtained and/or recorded in the course of the telemedicine visit and may receive copies of available information for a reasonable fee.  I understand that some of the potential risks of receiving the Services via telemedicine include:  ?Delay or interruption in medical evaluation due to technological equipment failure or disruption; ?Information transmitted may not be sufficient (e.g. poor resolution of images) to allow for appropriate medical decision making by the Practitioner; and/or   ?In rare instances, security protocols could fail, causing a breach of personal health information. ? ?Furthermore, I acknowledge that it is my responsibility to provide information about my medical history, conditions and care that is complete and accurate to the best of my ability. I acknowledge that Practitioner's advice, recommendations, and/or decision may be based on factors not within their control, such as incomplete or inaccurate data provided by me or distortions of diagnostic images or specimens that may result from electronic transmissions. I understand that the practice of medicine is not an exact science and that Practitioner makes no warranties or guarantees regarding treatment outcomes. I acknowledge that a copy of this consent can be made available to me via my patient portal ([facility]), or I can request a printed copy by calling the office of [facility].   ? ?I understand that my insurance will be billed for this visit.  ? ?I have read or had this consent read to me. ?I understand the contents of this consent, which adequately explains the benefits and risks of the Services being provided via telemedicine.  ?I have been provided ample opportunity to ask questions regarding this consent and the Services and have had my questions answered to my satisfaction. ?I give my informed consent for the services to be provided through the use of telemedicine in my medical care ? ?  ?

## 2021-11-26 NOTE — Telephone Encounter (Signed)
Spoke with patient and scheduled her for a telehealth pre-op clearance appointment on 01/04/22 at 10:20 AM. Consent is in and meds reviewed. ?

## 2021-11-26 NOTE — Telephone Encounter (Signed)
Will fax note to requesting surgeons office to make them aware. ?

## 2021-12-07 ENCOUNTER — Telehealth: Payer: Self-pay | Admitting: *Deleted

## 2021-12-07 NOTE — Telephone Encounter (Addendum)
Deductible n/a ? ?OOP MAX $3200 ($526.29 met) ? ?Annual exam 09/27/2021 ? ?Calcium   9.0          Date 11/12/2021 ? ?Upcoming dental procedures  N/A ? ?Prior Authorization needed no ? ?Pt estimated Cost $301 ? ? ? ?APPT 12/20/2021 ? ?Coverage Details 20% one dose, 20% admin fee ? ?

## 2021-12-20 ENCOUNTER — Ambulatory Visit (INDEPENDENT_AMBULATORY_CARE_PROVIDER_SITE_OTHER): Payer: PPO | Admitting: Pharmacist

## 2021-12-20 ENCOUNTER — Ambulatory Visit (INDEPENDENT_AMBULATORY_CARE_PROVIDER_SITE_OTHER): Payer: PPO

## 2021-12-20 ENCOUNTER — Ambulatory Visit: Payer: PPO | Admitting: Physician Assistant

## 2021-12-20 DIAGNOSIS — M810 Age-related osteoporosis without current pathological fracture: Secondary | ICD-10-CM | POA: Diagnosis not present

## 2021-12-20 DIAGNOSIS — Z5181 Encounter for therapeutic drug level monitoring: Secondary | ICD-10-CM

## 2021-12-20 DIAGNOSIS — I82401 Acute embolism and thrombosis of unspecified deep veins of right lower extremity: Secondary | ICD-10-CM

## 2021-12-20 LAB — POCT INR: INR: 4.4 — AB (ref 2.0–3.0)

## 2021-12-20 MED ORDER — DENOSUMAB 60 MG/ML ~~LOC~~ SOSY: 60.0000 mg | PREFILLED_SYRINGE | Freq: Once | SUBCUTANEOUS | Status: AC

## 2021-12-20 MED ORDER — ENOXAPARIN SODIUM 60 MG/0.6ML IJ SOSY: 60.0000 mg | PREFILLED_SYRINGE | Freq: Two times a day (BID) | INTRAMUSCULAR | 0 refills | Status: DC

## 2021-12-20 NOTE — Progress Notes (Deleted)
6/7: last dose warfarin (1 mg)   6/8: no warfarin, no Lovenox   6/9: Inject Lovenox 60 mg every 12 hours into the fatty abdominal tissue at least 2 inches from your belly button. No warfarin   6/10: Inject Lovenox 60 mg every 12 hours. No warfarin   6/11: Inject Lovenox 60 mg every 12 hours. No warfarin   6/12- Inject Lovenox 60 mg once in the AM. No warfarin   6/13: Procedure day. Resume warfarin 1 mg (boost dost) in the evening or as directed by doctor. No lovenox.   6/14: Inject Lovenox 60 mg every 12 hours. Warfarin 1.5 mg (boost dose)    6/15: Inject Lovenox 60 mg every 12 hours. Warfarin 0.5 mg (usual dose)   6/16: Inject Lovenox 60 mg every 12 hours. Warfarin 1 mg (usual dose)   6/17: Inject Lovenox 60 mg every 12 hours. Warfarin 1 mg (usual dose)   6/18: Inject Lovenox 60 mg every 12 hours. Warfarin 0.5 mg (usual dose)   6/19: Come in for INR check. Warfarin 1 mg (usual dose). Will determine whether you need a few more doses of Lovenox at this visit.

## 2021-12-20 NOTE — Patient Instructions (Addendum)
Skip your warfarin today, then continue taking Warfarin 1 tablet daily except for 1/2 tablet on Sundays, Tuesdays, and Thursdays. Last dose of warfarin pre-procedure is on 6/7, then follow below bridging instructions. Recheck INR 1 week post-procedure at the [facility] office. Coumadin Clinic [phone].  6/7: last dose of warfarin before your procedure   6/8: no warfarin, no Lovenox    6/9: Inject Lovenox 60 mg every 12 hours (9am and 9pm) into the fatty abdominal tissue at least 2 inches from your belly button. Rotate injection sites. No warfarin    6/10: Inject Lovenox 60 mg every 12 hours. No warfarin    6/11: Inject Lovenox 60 mg every 12 hours. No warfarin    6/12: Inject Lovenox 60 mg once in the AM. NO PM Lovenox dose. No warfarin    6/13: Procedure day. No blood thinners before your procedure. Resume warfarin 1 mg (boost dost) in the evening after your procedure or as directed by doctor. No Lovenox.    6/14: Resume Lovenox injections - inject Lovenox 60 mg every 12 hours. Warfarin 1.5 mg (boost dose)     6/15: Inject Lovenox 60 mg every 12 hours. Warfarin 0.5 mg (usual dose)    6/16: Inject Lovenox 60 mg every 12 hours. Warfarin 1 mg (usual dose)    6/17: Inject Lovenox 60 mg every 12 hours. Warfarin 1 mg (usual dose)    6/18: Inject Lovenox 60 mg every 12 hours. Warfarin 0.5 mg (usual dose)    6/19: Come in for INR check at the [facility] office.

## 2021-12-23 NOTE — Telephone Encounter (Signed)
Prolia given 12/20/2021 Next injection 06/23/2022

## 2021-12-31 ENCOUNTER — Encounter (HOSPITAL_COMMUNITY): Payer: Self-pay | Admitting: Gastroenterology

## 2022-01-04 ENCOUNTER — Ambulatory Visit (INDEPENDENT_AMBULATORY_CARE_PROVIDER_SITE_OTHER): Payer: PPO | Admitting: Physician Assistant

## 2022-01-04 DIAGNOSIS — Z01810 Encounter for preprocedural cardiovascular examination: Secondary | ICD-10-CM

## 2022-01-04 DIAGNOSIS — R001 Bradycardia, unspecified: Secondary | ICD-10-CM | POA: Diagnosis not present

## 2022-01-04 NOTE — Progress Notes (Signed)
Virtual Visit via Telephone Note   Because of Emma Lopez's co-morbid illnesses, she is at least at moderate risk for complications without adequate follow up.  This format is felt to be most appropriate for this patient at this time.  The patient did not have access to video technology/had technical difficulties with video requiring transitioning to audio format only (telephone).  All issues noted in this document were discussed and addressed.  No physical exam could be performed with this format.  Please refer to the patient's chart for her consent to telehealth for [facility].  Evaluation Performed:  Preoperative cardiovascular risk assessment _____________   Date:  01/04/2022   Patient ID:  Emma Lopez, DOB May 04, 1946, MRN [id number] Patient Location:  Home Provider location:   Office  Primary Care Provider:  Michael Boston, MD Primary Cardiologist:  Fransico Him, MD  Chief Complaint / Patient Profile   76 y.o. y/o female with a h/o of recurrent DVT/PE with positive lupus anticoagulant (followed by Dr. Marin Lopez), HLD, sinus bradycardia, GERD, fibromyalgia, hyperthyroidism, endometriosis, GERD, hiatal hernia, osteoporosis, intermittent LBBB who is pending colonoscopy and presents today for telephonic preoperative cardiovascular risk assessment.  Past Medical History    Past Medical History:  Diagnosis Date   Abnormal EKG    Anxiety    Arthritis    oa   Bradycardia 12/14/2017   CIN III (cervical intraepithelial neoplasia III) 3825   Complication of anesthesia    did well last 2 times with procedures   Depression    DES exposure in utero    DVT (deep venous thrombosis) ([place]) 01/2009   LEFT LEG   DVT (deep venous thrombosis) ([place]) 10/30/2020   Dyspnea on exertion 10/2017   Elevated triglycerides with high cholesterol    Endometriosis    Fibromyalgia    GERD (gastroesophageal reflux disease)    History of colon polyps    History of hiatal hernia    told by some md  has, some say not   IBS (irritable bowel syndrome) 2015   Left bundle branch block    intermittent   Lupus anticoagulant disorder ([place])    Multinodular goiter    Osteoporosis 12/2017   T score -3.2 stable from prior study   PE (pulmonary embolism)    Pneumonia 03/01/2021   PONV (postoperative nausea and vomiting)    Restless legs syndrome    Sinus bradycardia    Sleep disorder    Thyroid disease    HYPERTHYROIDISM   Vitamin D deficiency    Past Surgical History:  Procedure Laterality Date   ABDOMINAL HYSTERECTOMY  2001   TAH, partial   APPENDECTOMY  1978   COLONOSCOPY WITH PROPOFOL N/A 12/07/2015   Procedure: COLONOSCOPY WITH PROPOFOL;  Surgeon: Garlan Fair, MD;  Location: [facility];  Service: Endoscopy;  Laterality: N/A;   colonscopy  7 yrs ago   other in past   ESOPHAGOGASTRODUODENOSCOPY (EGD) WITH PROPOFOL N/A 12/07/2015   Procedure: ESOPHAGOGASTRODUODENOSCOPY (EGD) WITH PROPOFOL;  Surgeon: Garlan Fair, MD;  Location: [facility];  Service: Endoscopy;  Laterality: N/A;   ESOPHAGOGASTRODUODENOSCOPY ENDOSCOPY  yrs ago   FACIAL COSMETIC SURGERY     GYNECOLOGIC CRYOSURGERY     MYOMECTOMY     REPLACEMENT TOTAL JOINT WRIST W/ PROSTHETIC IMPLANT Right 08/01/2021   TONSILLECTOMY      Allergies  Allergies  Allergen Reactions   Other Other (See Comments)   Warfarin Sodium     Other reaction(s): rash (name brand coumadin  is ok)   Sulfa Antibiotics Rash and Other (See Comments)    History of Present Illness    Emma Lopez is a 76 y.o. female who presents via audio/video conferencing for a telehealth visit today.  Pt was last seen in cardiology clinic on 07/2021 by Melina Copa, PAC.  At that time Emma Lopez was doing well oaky.  The patient is now pending procedure as outlined above. Since her last visit, she well. Has stable DOE. Able to get done routine activity without any issue.    Home Medications    Prior to Admission medications   Medication Sig  Start Date End Date Taking? Authorizing Provider  ALPRAZolam (XANAX) 0.25 MG tablet Take 1 tablet (0.25 mg total) by mouth every 8 (eight) hours as needed for anxiety. 04/05/17   Huel Cote, NP  B Complex Vitamins (B COMPLEX 1 PO) Take 1 each by mouth 2 (two) times daily. sublingual    [provider]  Calcium Carbonate+Vitamin D 600-200 MG-UNIT TABS Take 1 tablet by mouth daily. 12/18/19   [provider]  denosumab (PROLIA) 60 MG/ML SOLN injection Inject 60 mg into the skin every 6 (six) months. Administer in upper arm, thigh, or abdomen 03/05/15   Huel Cote, NP  DULoxetine (CYMBALTA) 60 MG capsule Take 60 mg by mouth daily. 10/20/21   [provider]  enoxaparin (LOVENOX) 60 MG/0.6ML injection Inject 0.6 mLs (60 mg total) into the skin every 12 (twelve) hours. 12/20/21   Sueanne Margarita, MD  Multiple Vitamins-Minerals (MULTIVITAMIN PO) Take 1 tablet by mouth 2 (two) times daily.     [provider]  Peppermint Oil 0.2 ML CPDR     [provider]  rOPINIRole (REQUIP) 1 MG tablet Take 1 mg by mouth at bedtime.    [provider]  warfarin (COUMADIN) 1 MG tablet TAKE 1/2 TO 1 TABLET BY MOUTH ONCE DAILY AS DIRECTED BY COUMADIN CLINIC 11/15/21   Sueanne Margarita, MD    Physical Exam    Vital Signs:  Emma Lopez does not have vital signs available for review today.  Given telephonic nature of communication, physical exam is limited. AAOx3. NAD. Normal affect.  Speech and respirations are unlabored.  Accessory Clinical Findings    None  Assessment & Plan    1.  Preoperative Cardiovascular Risk Assessment: Given past medical history and time since last visit, based on ACC/AHA guidelines, Emma Lopez would be at acceptable risk for the planned procedure without further cardiovascular testing.   The patient was advised that if she develops new symptoms prior to surgery to contact our office to arrange for a follow-up visit, and she  verbalized understanding.  I will route this recommendation to the requesting party via Epic fax function and remove from pre-op pool.  "Per office protocol, patient can hold warfarin for 5 days prior to procedure. Patient will need bridging with Lovenox (enoxaparin) around procedure. Pt followed at [facility]mad[facility] clinic. Bridge will be coordinated at upcoming INR check"  A copy of this note will be routed to requesting surgeon.  Time:   Today, I have spent 6 minutes with the patient with telehealth technology discussing medical history, symptoms, and management plan.     [place], [place]  01/04/2022, 9:56 AM

## 2022-01-10 NOTE — H&P (Signed)
History of Present Illness  General:          74/female(father had colon cancer diagnosed in his 90s)        Colon 12/29/20 for rectal bleeding: IH, Cecal non bleeding AVM, polyps removed from recum, hepatic flexure and transverse, 3 Tas removed,repeat recommended in 1 year.        Normally she has daily BMs, usually 1-2/day, consistency of stool is soft but formed, very sticky.        She feels she cleans herself well and notices feces on her underpants when she goes to urinate, describes it as smears.        A few years ago, when she started developing rectal bleeding, she was on a high fiber diet, she likes almonds and granola,she stopped doing that as her water Bms were grainy and noted a big difference.        She has not had any rectal bleeding, since that 1 incidence.        Denies rectal pain bu has abdominal pain and has tried dicycomine, which worked in the beginning and she started taking up to 3 a day and she has tried taking the low FODMAP diet.        She drinks almond and oat milk, has switched bread that is not gluten, she has 90% non gluten.        She avoids high fructose corn syrup.        She states she has intermittent mild lower abdominal pain.        She takes peppermint oil every other day.   Current Medications  Taking  Lexapro(Escitalopram Oxalate) 10 MG Tablet 1 tablet Orally Once a day , Notes: patient needs an appointment  rOPINIRole HCl 1 MG Tablet 1 tablet by mouth at bedtime, occassionally 1/4 in the afernoon  Vitamin D3 - Liquid 1000iu per drop 3 drops daily , Notes: otc  Xanax(ALPRAZolam) 0.25 MG Tablet 1/2 -1 tablet Orally once a day as needed, Notes: [facility] Prn  Calcium + D(Calcium-Vitamin D) 600-200 MG-UNIT Tablet 1 tablet Orally Twice a day, Notes: OTC  Multivitamins Tablet as directed Orally Once a day, Notes: OTC   Prolia(Alpha1-Proteinase Inhibitor) 60 mg/ml Solution 60 mg per gynecology SQ every 6 months, Notes: SILVA  Warfarin Sodium 1 MG Tablet 1  tablet on Mon and Fri Orally 0.5 tab on the other days or as directed  Past Medical History       History of DVT right leg attributed to Evista in 9/05; history of DVT and bilateral pulmonary embolism by CT 02/27/09; then lifelong Coumadin ( Dr.Murinson) .       Osteoporosis, previous Evista then Fosamax then Reclast then Prolia.       Dyslipidemia..       Multinodular goiter with autonomous hyperthyroidism (prior methimazole 1998-2005); followed by Dr. Elyse Hsu..       Fibromyalgia, mild (Dr. Estanislado Pandy). Occasional fibromyalgia "flares" including increased fatigue; better with regular dosing of Ambien..       GERD; small hiatal hernia (prior Prevacid Solu Tabs as needed), quiescent..       Insomnia with sleep disorder (periodic limb movement disorder diagnosed at [facility] in 2006; she then saw neurologist C. Dohmeier, MD who tried Seroquel but it made her too drowsy, now on Requip for RLS)..       Depression; anxiety; possible OCD.Marland Kitchen       Cervical Dysplasia - had partial hysterectomy.       GYN, Dr.  Fontaine.     Dentist, Dr. Deanna Artis.      Ophtha, Dr. Delman Cheadle.      Ortho, Dr. Amedeo Plenty.      Dermatologist, [facility].      Abd pain.      DVT.    Surgical History        Exploratory laparoscopy for abdominal pain (nothing specific found) 1980s        Myomectomy 2002        Incidental appendectomy 1970s        Colonoscopy with polyp resection 01/2007        Partial Hysterectomy - Cervical Dysplasia         Colonoscopy 06/13/18        COLON 11/2020      Family History  Father: deceased, diagnosed with Colon cancer  Mother: deceased, Benign tumor removed from colon, diagnosed with Breast cancer  Sister 1: deceased, diagnosed with Ovarian cancer  Paternal aunt: deceased, Colon Cancer  Maternal aunt: deceased, two maternal aunts had breast cancer. One of these aunts also had colon cancer.  Father and paternal aunt had colon cancer. Her mother also had a benign tumor removed from her  colon. No children., No Family History of Liver Disease.    Social History  General:   Tobacco use       cigarettes:  Former smoker     Tobacco history last updated  11/16/2021     Vaping  No Alcohol: yes, Rare.  Caffeine: yes, 2 servings daily, tea.  no Recreational drug use, None.  DIET: no particular dietary program.  Exercise: yes, minimal of 90 minutes weekly, nothing structured.  DENTAL CARE: good.  Marital Status: Single.  OCCUPATION: retired.  no Smoking.     Allergies  sulfa: rash - Allergy     Hospitalization/Major Diagnostic Procedure  DVT admission 11/13/2020  Not in the past yr. 10/2021     Review of Systems  GI PROCEDURE:          Pacemaker/ AICD no.  Artificial heart valves no.  MI/heart attack no.  Abnormal heart rhythm no.  Angina no.  CVA no.  Hypertension no.  Hypotension no.  Asthma, COPD no.  Sleep apnea no.  Seizure disorders no.  Artificial joints no.  Severe DJD no.  Diabetes no.  Significant headaches no.  Vertigo YES.  Depression/anxiety YES.  Abnormal bleeding YES, Taking blood thinners.  Kidney Disease no.  Liver disease no.  Chance of pregnancy no.  Blood transfusion no.           Vital Signs  Wt 138.8, Ht 62, BMI 25.38.     Assessments     1. History of colon polyps - Z86.010     Treatment   1. History of colon polyps         IMAGING: Colonoscopy

## 2022-01-11 ENCOUNTER — Encounter (HOSPITAL_COMMUNITY)
Admission: RE | Disposition: A | Discharge status: Home / Self Care | Payer: Self-pay | Source: Home / Self Care | Attending: Gastroenterology

## 2022-01-11 ENCOUNTER — Ambulatory Visit (HOSPITAL_BASED_OUTPATIENT_CLINIC_OR_DEPARTMENT_OTHER): Payer: PPO | Admitting: Certified Registered"

## 2022-01-11 ENCOUNTER — Encounter (HOSPITAL_COMMUNITY): Payer: Self-pay | Admitting: Gastroenterology

## 2022-01-11 ENCOUNTER — Ambulatory Visit (HOSPITAL_COMMUNITY): Payer: PPO | Admitting: Certified Registered"

## 2022-01-11 ENCOUNTER — Ambulatory Visit (HOSPITAL_COMMUNITY)
Admission: RE | Admit: 2022-01-11 | Discharge: 2022-01-11 | Disposition: A | Discharge status: Home / Self Care | Payer: PPO | Attending: Gastroenterology | Admitting: Gastroenterology

## 2022-01-11 ENCOUNTER — Other Ambulatory Visit: Payer: Self-pay

## 2022-01-11 DIAGNOSIS — Z1211 Encounter for screening for malignant neoplasm of colon: Secondary | ICD-10-CM | POA: Diagnosis not present

## 2022-01-11 DIAGNOSIS — Z86718 Personal history of other venous thrombosis and embolism: Secondary | ICD-10-CM | POA: Diagnosis not present

## 2022-01-11 DIAGNOSIS — Z800 Family history of malignant neoplasm of digestive organs: Secondary | ICD-10-CM | POA: Diagnosis not present

## 2022-01-11 DIAGNOSIS — K635 Polyp of colon: Secondary | ICD-10-CM | POA: Diagnosis not present

## 2022-01-11 DIAGNOSIS — D123 Benign neoplasm of transverse colon: Secondary | ICD-10-CM | POA: Insufficient documentation

## 2022-01-11 DIAGNOSIS — K5520 Angiodysplasia of colon without hemorrhage: Secondary | ICD-10-CM

## 2022-01-11 DIAGNOSIS — M810 Age-related osteoporosis without current pathological fracture: Secondary | ICD-10-CM

## 2022-01-11 DIAGNOSIS — Z87891 Personal history of nicotine dependence: Secondary | ICD-10-CM | POA: Insufficient documentation

## 2022-01-11 DIAGNOSIS — I2699 Other pulmonary embolism without acute cor pulmonale: Secondary | ICD-10-CM | POA: Diagnosis not present

## 2022-01-11 DIAGNOSIS — Z86010 Personal history of colonic polyps: Secondary | ICD-10-CM | POA: Insufficient documentation

## 2022-01-11 DIAGNOSIS — K219 Gastro-esophageal reflux disease without esophagitis: Secondary | ICD-10-CM | POA: Insufficient documentation

## 2022-01-11 DIAGNOSIS — I82409 Acute embolism and thrombosis of unspecified deep veins of unspecified lower extremity: Secondary | ICD-10-CM | POA: Diagnosis not present

## 2022-01-11 HISTORY — PX: POLYPECTOMY: SHX5525

## 2022-01-11 HISTORY — PX: HOT HEMOSTASIS: SHX5433

## 2022-01-11 HISTORY — PX: HEMOSTASIS CLIP PLACEMENT: SHX6857

## 2022-01-11 HISTORY — PX: COLONOSCOPY WITH PROPOFOL: SHX5780

## 2022-01-11 SURGERY — COLONOSCOPY WITH PROPOFOL
Anesthesia: Monitor Anesthesia Care

## 2022-01-11 MED ORDER — PROPOFOL 10 MG/ML IV BOLUS
INTRAVENOUS | Status: AC
  Filled 2022-01-11: qty 20

## 2022-01-11 MED ORDER — PROPOFOL 10 MG/ML IV BOLUS
INTRAVENOUS | Status: DC | PRN
  Administered 2022-01-11: 10 mg via INTRAVENOUS
  Administered 2022-01-11: 20 mg via INTRAVENOUS

## 2022-01-11 MED ORDER — LIDOCAINE 2% (20 MG/ML) 5 ML SYRINGE
INTRAMUSCULAR | Status: DC | PRN
  Administered 2022-01-11: 40 mg via INTRAVENOUS

## 2022-01-11 MED ORDER — PROPOFOL 500 MG/50ML IV EMUL
INTRAVENOUS | Status: DC | PRN
  Administered 2022-01-11: 125 ug/kg/min via INTRAVENOUS

## 2022-01-11 MED ORDER — PROPOFOL 1000 MG/100ML IV EMUL
INTRAVENOUS | Status: AC
  Filled 2022-01-11: qty 100

## 2022-01-11 MED ORDER — SODIUM CHLORIDE 0.9 % IV SOLN: INTRAVENOUS | Status: DC

## 2022-01-11 MED ORDER — LACTATED RINGERS IV SOLN: INTRAVENOUS | Status: DC

## 2022-01-11 SURGICAL SUPPLY — 22 items

## 2022-01-11 NOTE — Transfer of Care (Signed)
Immediate Anesthesia Transfer of Care Note  Patient: Emma Lopez  Procedure(s) Performed: COLONOSCOPY WITH PROPOFOL HOT HEMOSTASIS (ARGON PLASMA COAGULATION/BICAP) HEMOSTASIS CLIP PLACEMENT  Patient Location: PACU and Endoscopy Unit  Anesthesia Type:MAC  Level of Consciousness: awake, alert  and patient cooperative  Airway & Oxygen Therapy: Patient Spontanous Breathing and Patient connected to face mask oxygen  Post-op Assessment: Report given to RN and Post -op Vital signs reviewed and stable  Post vital signs: Reviewed and stable  Last Vitals:  Vitals Value Taken Time  BP    Temp 36.5 C 01/11/22 0832  Pulse 57 01/11/22 0833  Resp 19 01/11/22 0833  SpO2 100 % 01/11/22 0833  Vitals shown include unvalidated device data.  Last Pain:  Vitals:   01/11/22 0832  TempSrc: Temporal  PainSc: 0-No pain         Complications: No notable events documented.

## 2022-01-11 NOTE — Op Note (Signed)
[facility] Patient Name: Emma Lopez Procedure Date: 01/11/2022 MRN: [id number] Attending MD: Ronnette Juniper , MD Date of Birth: Jan 11, 1946 CSN: [id number] Age: 76 Admit Type: Outpatient Procedure:                Colonoscopy Indications:              High risk colon cancer surveillance: Personal                            history of multiple (3 or more) adenomas, Last                            colonoscopy 1 year ago, 5/22,cecal AVM Providers:                Ronnette Juniper, MD, Jaci Carrel, RN, William Dalton, Technician, Darliss Cheney, Technician Referring MD:             Beryl Meager Medicines:                Monitored Anesthesia Care Complications:            No immediate complications. Estimated blood loss:                            Minimal. Estimated Blood Loss:     Estimated blood loss was minimal. Procedure:                Pre-Anesthesia Assessment:                           - Prior to the procedure, a History and Physical                            was performed, and patient medications and                            allergies were reviewed. The patient's tolerance of                            previous anesthesia was also reviewed. The risks                            and benefits of the procedure and the sedation                            options and risks were discussed with the patient.                            All questions were answered, and informed consent                            was obtained. Prior Anticoagulants: The patient has  taken Coumadin (warfarin), last dose was 3 days                            prior to procedure. ASA Grade Assessment: III - A                            patient with severe systemic disease. After                            reviewing the risks and benefits, the patient was                            deemed in satisfactory condition to undergo the                             procedure.                           After obtaining informed consent, the colonoscope                            was passed under direct vision. Throughout the                            procedure, the patient's blood pressure, pulse, and                            oxygen saturations were monitored continuously. The                            PCF-HQ190L ([id number]) Olympus colonoscope was                            introduced through the anus and advanced to the the                            terminal ileum. The colonoscopy was performed                            without difficulty. The patient tolerated the                            procedure well. The quality of the bowel                            preparation was good. Scope In: 8:12:48 AM Scope Out: 8:26:23 AM Scope Withdrawal Time: 0 hours 9 minutes 40 seconds  Total Procedure Duration: 0 hours 13 minutes 35 seconds  Findings:      The perianal and digital rectal examinations were normal.      The terminal ileum appeared normal.      A single medium-sized localized angioectasia with typical arborization       was found in the cecum. Coagulation for tissue destruction using argon       plasma at 0.5 liters/minute and 20  watts was successful. For hemostasis,       one hemostatic clip was successfully placed (MR conditional). There was       no bleeding at the end of the procedure.      A 4 mm polyp was found in the transverse colon. The polyp was sessile.       The polyp was removed with a piecemeal technique using a cold biopsy       forceps. Resection and retrieval were complete.      The exam was otherwise without abnormality on direct and retroflexion       views. Impression:               - The examined portion of the ileum was normal.                           - A single colonic angioectasia. Treated with argon                            plasma coagulation (APC). Clip (MR conditional) was                             placed.                           - One 4 mm polyp in the transverse colon, removed                            piecemeal using a cold biopsy forceps. Resected and                            retrieved.                           - The examination was otherwise normal on direct                            and retroflexion views. Moderate Sedation:      Patient did not receive moderate sedation for this procedure, but       instead received monitored anesthesia care. Recommendation:           - Patient has a contact number available for                            emergencies. The signs and symptoms of potential                            delayed complications were discussed with the                            patient. Return to normal activities tomorrow.                            Written discharge instructions were provided to the                            patient.                           -  Resume regular diet.                           - Continue present medications.                           - Await pathology results.                           - Repeat colonoscopy for surveillance based on                            pathology results. Procedure Code(s):        --- Professional ---                           [phone], Colonoscopy, flexible; with ablation of                            tumor(s), polyp(s), or other lesion(s) (includes                            pre- and post-dilation and guide wire passage, when                            performed)                           45380, 77, Colonoscopy, flexible; with biopsy,                            single or multiple Diagnosis Code(s):        --- Professional ---                           Z86.010, Personal history of colonic polyps                           K55.20, Angiodysplasia of colon without hemorrhage                           K63.5, Polyp of colon CPT copyright 2019 American Medical Association. All rights reserved. The codes  documented in this report are preliminary and upon coder review may  be revised to meet current compliance requirements. Ronnette Juniper, MD 01/11/2022 8:33:04 AM This report has been signed electronically. Number of Addenda: 0

## 2022-01-11 NOTE — Anesthesia Postprocedure Evaluation (Signed)
Anesthesia Post Note  Patient: Emma Lopez  Procedure(s) Performed: COLONOSCOPY WITH PROPOFOL HOT HEMOSTASIS (ARGON PLASMA COAGULATION/BICAP) HEMOSTASIS CLIP PLACEMENT     Patient location during evaluation: PACU Anesthesia Type: MAC Level of consciousness: awake and alert Pain management: pain level controlled Vital Signs Assessment: post-procedure vital signs reviewed and stable Respiratory status: spontaneous breathing, nonlabored ventilation, respiratory function stable and patient connected to nasal cannula oxygen Cardiovascular status: stable and blood pressure returned to baseline Postop Assessment: no apparent nausea or vomiting Anesthetic complications: no   No notable events documented.  Last Vitals:  Vitals:   01/11/22 0840 01/11/22 0850  BP: (!) 126/59 (!) 168/56  Pulse: 61 (!) 57  Resp: (!) 21 14  Temp:    SpO2: 100% 100%    Last Pain:  Vitals:   01/11/22 0850  TempSrc:   PainSc: 0-No pain                 [name] S

## 2022-01-11 NOTE — Anesthesia Preprocedure Evaluation (Signed)
Anesthesia Evaluation  Patient identified by MRN, date of birth, ID band Patient awake    Reviewed: Allergy & Precautions, NPO status , Patient's Chart, lab work & pertinent test results  History of Anesthesia Complications (+) PONV and history of anesthetic complications  Airway Mallampati: II  TM Distance: >3 FB Neck ROM: Full    Dental no notable dental hx.    Pulmonary former smoker, PE   Pulmonary exam normal breath sounds clear to auscultation       Cardiovascular + DVT  Normal cardiovascular exam Rhythm:Regular Rate:Normal     Neuro/Psych negative neurological ROS  negative psych ROS   GI/Hepatic Neg liver ROS, GERD  ,  Endo/Other  negative endocrine ROS  Renal/GU negative Renal ROS  negative genitourinary   Musculoskeletal negative musculoskeletal ROS (+)   Abdominal   Peds negative pediatric ROS (+)  Hematology negative hematology ROS (+)   Anesthesia Other Findings   Reproductive/Obstetrics negative OB ROS                             Anesthesia Physical Anesthesia Plan  ASA: 3  Anesthesia Plan: MAC   Post-op Pain Management: Minimal or no pain anticipated   Induction: Intravenous  PONV Risk Score and Plan: 3 and Propofol infusion and Treatment may vary due to age or medical condition  Airway Management Planned: Simple Face Mask  Additional Equipment:   Intra-op Plan:   Post-operative Plan:   Informed Consent: I have reviewed the patients History and Physical, chart, labs and discussed the procedure including the risks, benefits and alternatives for the proposed anesthesia with the patient or authorized representative who has indicated his/her understanding and acceptance.     Dental advisory given  Plan Discussed with: CRNA and Surgeon  Anesthesia Plan Comments:         Anesthesia Quick Evaluation

## 2022-01-11 NOTE — Anesthesia Procedure Notes (Signed)
Procedure Name: MAC Date/Time: 01/11/2022 8:06 AM  Performed by: Eben Burow, CRNAPre-anesthesia Checklist: Patient identified, Emergency Drugs available, Suction available, Patient being monitored and Timeout performed Oxygen Delivery Method: Simple face mask Placement Confirmation: positive ETCO2

## 2022-01-11 NOTE — Discharge Instructions (Signed)

## 2022-01-11 NOTE — Interval H&P Note (Signed)
History and Physical Interval Note: 75/female with multiple Tubular adenomas removed in 5/22, cecal AVM, colon cancer in her father in 53s, was on warfarin and lovenox, for a colonoscopy with possible APC with propofol. 01/11/2022 7:33 AM  Emma Lopez  has presented today for colonoscopy, with the diagnosis of Hx of polyps.  The various methods of treatment have been discussed with the patient and family. After consideration of risks, benefits and other options for treatment, the patient has consented to  Procedure(s): COLONOSCOPY WITH PROPOFOL (N/A) as a surgical intervention.  The patient's history has been reviewed, patient examined, no change in status, stable for surgery.  I have reviewed the patient's chart and labs.  Questions were answered to the patient's satisfaction.     Ronnette Juniper

## 2022-01-12 ENCOUNTER — Emergency Department (HOSPITAL_BASED_OUTPATIENT_CLINIC_OR_DEPARTMENT_OTHER)
Admission: EM | Admit: 2022-01-12 | Discharge: 2022-01-12 | Disposition: A | Discharge status: Home / Self Care | Payer: PPO | Attending: Emergency Medicine | Admitting: Emergency Medicine

## 2022-01-12 ENCOUNTER — Emergency Department (HOSPITAL_BASED_OUTPATIENT_CLINIC_OR_DEPARTMENT_OTHER): Payer: PPO

## 2022-01-12 ENCOUNTER — Encounter (HOSPITAL_BASED_OUTPATIENT_CLINIC_OR_DEPARTMENT_OTHER): Payer: Self-pay | Admitting: Emergency Medicine

## 2022-01-12 ENCOUNTER — Other Ambulatory Visit: Payer: Self-pay

## 2022-01-12 DIAGNOSIS — S0093XA Contusion of unspecified part of head, initial encounter: Secondary | ICD-10-CM

## 2022-01-12 DIAGNOSIS — S0083XA Contusion of other part of head, initial encounter: Secondary | ICD-10-CM | POA: Insufficient documentation

## 2022-01-12 DIAGNOSIS — Z7901 Long term (current) use of anticoagulants: Secondary | ICD-10-CM | POA: Insufficient documentation

## 2022-01-12 DIAGNOSIS — W182XXA Fall in (into) shower or empty bathtub, initial encounter: Secondary | ICD-10-CM | POA: Insufficient documentation

## 2022-01-12 DIAGNOSIS — S0990XA Unspecified injury of head, initial encounter: Secondary | ICD-10-CM | POA: Diagnosis not present

## 2022-01-12 LAB — SURGICAL PATHOLOGY

## 2022-01-12 NOTE — Discharge Instructions (Signed)
Return if any problems.

## 2022-01-12 NOTE — ED Provider Notes (Signed)
[facility] Provider Note   CSN: [id number] Arrival date & time: 01/12/22  1026     History  Chief Complaint  Patient presents with   [name]    Emma Lopez is a 76 y.o. female.  Patient reports she slipped and fell hitting her head on the tub on Sunday.  This was 4 days ago.  Patient did not lose consciousness.  Planes of a large to bruised area on the back of her head.  Patient has a history of DVTs and is on anticoagulation.  Patient has been on Lovenox for the past week.  She is normally on Coumadin but was placed on Lovenox in order to have a colonoscopy yesterday.  Patient told the nurses at the endoscopy center about her fall and they advised her she should have a CT scan of her head.  Patient denies any current symptoms she is not dizzy she is not nauseated she did not have any vision changes she did not have any hearing changes.  Patient reports she also struck her right side.  She denies any back pain she denies any rib pain  The history is provided by the patient. No language interpreter was used.  Fall This is a new problem. The current episode started more than 2 days ago. The problem has not changed since onset.Pertinent negatives include no chest pain, no abdominal pain and no headaches. Nothing aggravates the symptoms. She has tried nothing for the symptoms.       Home Medications Prior to Admission medications   Medication Sig Start Date End Date Taking? Authorizing Provider  ALPRAZolam (XANAX) 0.25 MG tablet Take 1 tablet (0.25 mg total) by mouth every 8 (eight) hours as needed for anxiety. 04/05/17   Huel Cote, NP  B Complex Vitamins (B COMPLEX 1 PO) Take 1 each by mouth daily. sublingual    [provider]  Calcium Carbonate+Vitamin D 600-200 MG-UNIT TABS Take 1 tablet by mouth daily. 12/18/19   [provider]  denosumab (PROLIA) 60 MG/ML SOLN injection Inject 60 mg into the skin every 6 (six) months. Administer in  upper arm, thigh, or abdomen 03/05/15   Huel Cote, NP  DULoxetine (CYMBALTA) 60 MG capsule Take 60 mg by mouth daily. 10/20/21   [provider]  enoxaparin (LOVENOX) 60 MG/0.6ML injection Inject 0.6 mLs (60 mg total) into the skin every 12 (twelve) hours. 12/20/21   Sueanne Margarita, MD  methocarbamol (ROBAXIN) 500 MG tablet Take 250 mg by mouth daily as needed for muscle spasms.    [provider]  Multiple Vitamins-Minerals (MULTIVITAMIN PO) Take 1 tablet by mouth daily.    [provider]  Peppermint Oil 0.2 ML CPDR Take 0.2 mLs by mouth daily as needed (IBS).    [provider]  rOPINIRole (REQUIP) 1 MG tablet Take 1 mg by mouth at bedtime.    [provider]  warfarin (COUMADIN) 1 MG tablet TAKE 1/2 TO 1 TABLET BY MOUTH ONCE DAILY AS DIRECTED BY COUMADIN CLINIC Patient taking differently: Take 1 mg by mouth daily. AS DIRECTED BY COUMADIN CLINIC 11/15/21   Sueanne Margarita, MD      Allergies    Sulfa antibiotics    Review of Systems   Review of Systems  Cardiovascular:  Negative for chest pain.  Gastrointestinal:  Negative for abdominal pain.  Neurological:  Negative for headaches.  All other systems reviewed and are negative.   Physical Exam Updated Vital Signs  BP (!) 146/80 (BP Location: Right Arm)   Pulse 63   Temp 98.2 F (36.8 C) (Oral)   Resp 16   SpO2 100%  Physical Exam Vitals and nursing note reviewed.  Constitutional:      Appearance: She is well-developed.  HENT:     Head: Normocephalic.     Nose: Nose normal.     Mouth/Throat:     Mouth: Mucous membranes are moist.  Eyes:     Extraocular Movements: Extraocular movements intact.     Conjunctiva/sclera: Conjunctivae normal.     Pupils: Pupils are equal, round, and reactive to light.  Cardiovascular:     Rate and Rhythm: Normal rate.  Pulmonary:     Effort: Pulmonary effort is normal.  Abdominal:     General: There is no distension.  Musculoskeletal:         General: Normal range of motion.     Cervical back: Normal range of motion.  Skin:    General: Skin is warm.  Neurological:     General: No focal deficit present.     Mental Status: She is alert and oriented to person, place, and time.     ED Results / Procedures / Treatments   Labs (all labs ordered are listed, but only abnormal results are displayed) Labs Reviewed - No data to display  EKG None  Radiology CT Head Wo Contrast  Result Date: 01/12/2022 CLINICAL DATA:  Head trauma, minor (Age >= 65y) EXAM: CT HEAD WITHOUT CONTRAST TECHNIQUE: Contiguous axial images were obtained from the base of the skull through the vertex without intravenous contrast. RADIATION DOSE REDUCTION: This exam was performed according to the departmental dose-optimization program which includes automated exposure control, adjustment of the mA and/or kV according to patient size and/or use of iterative reconstruction technique. COMPARISON:  MRI head Dec 01, 2020. FINDINGS: Brain: No evidence of acute infarction, hemorrhage, hydrocephalus, extra-axial collection or mass lesion/mass effect. Cerebral atrophy. Vascular: No hyperdense vessel identified. Skull: No acute fracture. Sinuses/Orbits: Clear sinuses.  No acute orbital findings. Other: No mastoid effusions. IMPRESSION: 1. No evidence of acute intracranial abnormality. 2.  Cerebral atrophy (ICD10-G31.9). Electronically Signed   By: Margaretha Sheffield M.D.   On: 01/12/2022 11:09    Procedures Procedures    Medications Ordered in ED Medications - No data to display  ED Course/ Medical Decision Making/ A&P                           Medical Decision Making Patient reports she fell 4 days ago hitting the back of her head  Amount and/or Complexity of Data Reviewed External Data Reviewed: notes.    Details: Primary care notes reviewed Radiology: ordered and independent interpretation performed. Decision-making details documented in ED Course.    Details: CT  of patient's head acute abnormality  Risk OTC drugs.           Final Clinical Impression(s) / ED Diagnoses Final diagnoses:  Contusion of head, unspecified part of head, initial encounter    Rx / DC Orders ED Discharge Orders     None      An After Visit Summary was printed and given to the patient.    Fransico Meadow, [place] 01/12/22 1352    Fredia Sorrow, MD 01/29/22 [phone]

## 2022-01-12 NOTE — ED Triage Notes (Signed)
Lost her balance after getting out of the shower on Sunday. She hit her head on the tub. She takes warfarin.

## 2022-01-16 ENCOUNTER — Encounter (HOSPITAL_COMMUNITY): Payer: Self-pay | Admitting: Gastroenterology

## 2022-01-17 ENCOUNTER — Ambulatory Visit (INDEPENDENT_AMBULATORY_CARE_PROVIDER_SITE_OTHER): Payer: PPO | Admitting: *Deleted

## 2022-01-17 DIAGNOSIS — Z5181 Encounter for therapeutic drug level monitoring: Secondary | ICD-10-CM | POA: Diagnosis not present

## 2022-01-17 DIAGNOSIS — I82401 Acute embolism and thrombosis of unspecified deep veins of right lower extremity: Secondary | ICD-10-CM | POA: Diagnosis not present

## 2022-01-17 LAB — POCT INR: INR: 2.8 (ref 2.0–3.0)

## 2022-01-17 NOTE — Patient Instructions (Signed)
Description   Continue taking Warfarin 1 tablet daily except for 1/2 tablet on Sundays, Tuesdays, and Thursdays.  Recheck INR 4 weeks.   [facility]in Clinic [phone]      

## 2022-01-21 DIAGNOSIS — Z1231 Encounter for screening mammogram for malignant neoplasm of breast: Secondary | ICD-10-CM | POA: Diagnosis not present

## 2022-01-25 ENCOUNTER — Encounter: Payer: Self-pay | Admitting: Obstetrics and Gynecology

## 2022-01-25 DIAGNOSIS — M25531 Pain in right wrist: Secondary | ICD-10-CM | POA: Diagnosis not present

## 2022-01-26 DIAGNOSIS — Z4789 Encounter for other orthopedic aftercare: Secondary | ICD-10-CM | POA: Diagnosis not present

## 2022-01-26 DIAGNOSIS — M79644 Pain in right finger(s): Secondary | ICD-10-CM | POA: Diagnosis not present

## 2022-01-26 DIAGNOSIS — M25531 Pain in right wrist: Secondary | ICD-10-CM | POA: Diagnosis not present

## 2022-01-28 NOTE — H&P (Signed)
History of Present Illness  General:          74/female(father had colon cancer diagnosed in his 90s)        Colon 12/29/20 for rectal bleeding: IH, Cecal non bleeding AVM, polyps removed from recum, hepatic flexure and transverse, 3 Tas removed,repeat recommended in 1 year.        Normally she has daily BMs, usually 1-2/day, consistency of stool is soft but formed, very sticky.        She feels she cleans herself well and notices feces on her underpants when she goes to urinate, describes it as smears.        A few years ago, when she started developing rectal bleeding, she was on a high fiber diet, she likes almonds and granola,she stopped doing that as her water Bms were grainy and noted a big difference.        She has not had any rectal bleeding, since that 1 incidence.        Denies rectal pain bu has abdominal pain and has tried dicycomine, which worked in the beginning and she started taking up to 3 a day and she has tried taking the low FODMAP diet.        She drinks almond and oat milk, has switched bread that is not gluten, she has 90% non gluten.        She avoids high fructose corn syrup.        She states she has intermittent mild lower abdominal pain.        She takes peppermint oil every other day.    Current Medications  Taking  Lexapro(Escitalopram Oxalate) 10 MG Tablet 1 tablet Orally Once a day , Notes: patient needs an appointment  rOPINIRole HCl 1 MG Tablet 1 tablet by mouth at bedtime, occassionally 1/4 in the afernoon  Vitamin D3 - Liquid 1000iu per drop 3 drops daily , Notes: otc  Xanax(ALPRAZolam) 0.25 MG Tablet 1/2 -1 tablet Orally once a day as needed, Notes: [facility] Prn  Calcium + D(Calcium-Vitamin D) 600-200 MG-UNIT Tablet 1 tablet Orally Twice a day, Notes: OTC  Multivitamins Tablet as directed Orally Once a day, Notes: OTC   Prolia(Alpha1-Proteinase Inhibitor) 60 mg/ml Solution 60 mg per gynecology SQ every 6 months, Notes: SILVA  Warfarin Sodium 1 MG Tablet 1  tablet on Mon and Fri Orally 0.5 tab on the other days or as directed   Past Medical History       History of DVT right leg attributed to Evista in 9/05; history of DVT and bilateral pulmonary embolism by CT 02/27/09; then lifelong Coumadin ( Dr.Murinson) .       Osteoporosis, previous Evista then Fosamax then Reclast then Prolia.       Dyslipidemia..       Multinodular goiter with autonomous hyperthyroidism (prior methimazole 1998-2005); followed by Dr. Elyse Hsu..       Fibromyalgia, mild (Dr. Estanislado Pandy). Occasional fibromyalgia "flares" including increased fatigue; better with regular dosing of Ambien..       GERD; small hiatal hernia (prior Prevacid Solu Tabs as needed), quiescent..       Insomnia with sleep disorder (periodic limb movement disorder diagnosed at [facility] in 2006; she then saw neurologist C. Dohmeier, MD who tried Seroquel but it made her too drowsy, now on Requip for RLS)..       Depression; anxiety; possible OCD.Marland Kitchen       Cervical Dysplasia - had partial hysterectomy.  GYN, Dr. Phineas Real.     Dentist, Dr. Deanna Artis.      Ophtha, Dr. Delman Cheadle.      Ortho, Dr. Amedeo Plenty.      Dermatologist, [facility].      Abd pain.      DVT.     Surgical History        Exploratory laparoscopy for abdominal pain (nothing specific found) 1980s        Myomectomy 2002        Incidental appendectomy 1970s        Colonoscopy with polyp resection 01/2007        Partial Hysterectomy - Cervical Dysplasia         Colonoscopy 06/13/18        COLON 11/2020       Family History  Father: deceased, diagnosed with Colon cancer  Mother: deceased, Benign tumor removed from colon, diagnosed with Breast cancer  Sister 1: deceased, diagnosed with Ovarian cancer  Paternal aunt: deceased, Colon Cancer  Maternal aunt: deceased, two maternal aunts had breast cancer. One of these aunts also had colon cancer.  Father and paternal aunt had colon cancer. Her mother also had a benign tumor removed from her  colon. No children., No Family History of Liver Disease.    Social History  General:   Tobacco use       cigarettes:  Former smoker     Tobacco history last updated  11/16/2021     Vaping  No Alcohol: yes, Rare.  Caffeine: yes, 2 servings daily, tea.  no Recreational drug use, None.  DIET: no particular dietary program.  Exercise: yes, minimal of 90 minutes weekly, nothing structured.  DENTAL CARE: good.  Marital Status: Single.  OCCUPATION: retired.  no Smoking.      Allergies  sulfa: rash - Allergy      Hospitalization/Major Diagnostic Procedure  DVT admission 11/13/2020  Not in the past yr. 10/2021      Review of Systems  GI PROCEDURE:          Pacemaker/ AICD no.  Artificial heart valves no.  MI/heart attack no.  Abnormal heart rhythm no.  Angina no.  CVA no.  Hypertension no.  Hypotension no.  Asthma, COPD no.  Sleep apnea no.  Seizure disorders no.  Artificial joints no.  Severe DJD no.  Diabetes no.  Significant headaches no.  Vertigo YES.  Depression/anxiety YES.  Abnormal bleeding YES, Taking blood thinners.  Kidney Disease no.  Liver disease no.  Chance of pregnancy no.  Blood transfusion no.              Vital Signs  Wt 138.8, Ht 62, BMI 25.38.  T97.46F, BP 116/42, PR 56, RR 18, saturating 100% on 6 L oxygen General-not in distress No pallor, no icterus Abdomen-soft, nondistended, nontender, normoactive bowel sounds Neuro-alert, awake, oriented x3   Assessments      1. History of colon polyps - Z86.010      Treatment    1. History of colon polyps         IMAGING: Colonoscopy

## 2022-02-16 ENCOUNTER — Ambulatory Visit (INDEPENDENT_AMBULATORY_CARE_PROVIDER_SITE_OTHER): Payer: PPO

## 2022-02-16 DIAGNOSIS — M1811 Unilateral primary osteoarthritis of first carpometacarpal joint, right hand: Secondary | ICD-10-CM | POA: Diagnosis not present

## 2022-02-16 DIAGNOSIS — I82402 Acute embolism and thrombosis of unspecified deep veins of left lower extremity: Secondary | ICD-10-CM

## 2022-02-16 DIAGNOSIS — Z5181 Encounter for therapeutic drug level monitoring: Secondary | ICD-10-CM | POA: Diagnosis not present

## 2022-02-16 DIAGNOSIS — M180 Bilateral primary osteoarthritis of first carpometacarpal joints: Secondary | ICD-10-CM | POA: Diagnosis not present

## 2022-02-16 LAB — POCT INR: INR: 3.6 — AB (ref 2.0–3.0)

## 2022-02-16 NOTE — Patient Instructions (Signed)
Description   Eat a serving of greens today and continue taking Warfarin 1 tablet daily except for 1/2 tablet on Sundays, Tuesdays, and Thursdays. Recheck INR 5 weeks. Coumadin Clinic [phone].

## 2022-02-18 ENCOUNTER — Encounter: Payer: Self-pay | Admitting: Hematology & Oncology

## 2022-02-18 ENCOUNTER — Inpatient Hospital Stay (HOSPITAL_BASED_OUTPATIENT_CLINIC_OR_DEPARTMENT_OTHER): Payer: PPO | Admitting: Hematology & Oncology

## 2022-02-18 ENCOUNTER — Other Ambulatory Visit: Payer: Self-pay

## 2022-02-18 ENCOUNTER — Inpatient Hospital Stay: Payer: PPO | Attending: Hematology & Oncology

## 2022-02-18 VITALS — BP 128/51 | HR 56 | Temp 98.1°F | Resp 18 | Ht 62.0 in | Wt 133.0 lb

## 2022-02-18 DIAGNOSIS — Z7901 Long term (current) use of anticoagulants: Secondary | ICD-10-CM | POA: Diagnosis not present

## 2022-02-18 DIAGNOSIS — D6862 Lupus anticoagulant syndrome: Secondary | ICD-10-CM | POA: Insufficient documentation

## 2022-02-18 DIAGNOSIS — I82402 Acute embolism and thrombosis of unspecified deep veins of left lower extremity: Secondary | ICD-10-CM | POA: Diagnosis not present

## 2022-02-18 DIAGNOSIS — Z86718 Personal history of other venous thrombosis and embolism: Secondary | ICD-10-CM | POA: Diagnosis not present

## 2022-02-18 DIAGNOSIS — I82401 Acute embolism and thrombosis of unspecified deep veins of right lower extremity: Secondary | ICD-10-CM | POA: Diagnosis not present

## 2022-02-18 LAB — CBC WITH DIFFERENTIAL (CANCER CENTER ONLY)
Abs Immature Granulocytes: 0.03 10*3/uL (ref 0.00–0.07)
Basophils Absolute: 0.1 10*3/uL (ref 0.0–0.1)
Basophils Relative: 1 %
Eosinophils Absolute: 0.2 10*3/uL (ref 0.0–0.5)
Eosinophils Relative: 4 %
HCT: 43.7 % (ref 36.0–46.0)
Hemoglobin: 14.1 g/dL (ref 12.0–15.0)
Immature Granulocytes: 1 %
Lymphocytes Relative: 26 %
Lymphs Abs: 1.6 10*3/uL (ref 0.7–4.0)
MCH: 30.5 pg (ref 26.0–34.0)
MCHC: 32.3 g/dL (ref 30.0–36.0)
MCV: 94.4 fL (ref 80.0–100.0)
Monocytes Absolute: 0.5 10*3/uL (ref 0.1–1.0)
Monocytes Relative: 7 %
Neutro Abs: 3.9 10*3/uL (ref 1.7–7.7)
Neutrophils Relative %: 61 %
Platelet Count: 242 10*3/uL (ref 150–400)
RBC: 4.63 MIL/uL (ref 3.87–5.11)
RDW: 13.3 % (ref 11.5–15.5)
WBC Count: 6.3 10*3/uL (ref 4.0–10.5)
nRBC: 0 % (ref 0.0–0.2)

## 2022-02-18 LAB — CMP (CANCER CENTER ONLY)
ALT: 13 U/L (ref 0–44)
AST: 18 U/L (ref 15–41)
Albumin: 4.1 g/dL (ref 3.5–5.0)
Alkaline Phosphatase: 68 U/L (ref 38–126)
Anion gap: 5 (ref 5–15)
BUN: 17 mg/dL (ref 8–23)
CO2: 30 mmol/L (ref 22–32)
Calcium: 8.9 mg/dL (ref 8.9–10.3)
Chloride: 103 mmol/L (ref 98–111)
Creatinine: 0.78 mg/dL (ref 0.44–1.00)
GFR, Estimated: >60 mL/min (ref >60)
Glucose, Bld: 157 mg/dL — ABNORMAL HIGH (ref 70–99)
Potassium: 4 mmol/L (ref 3.5–5.1)
Sodium: 138 mmol/L (ref 135–145)
Total Bilirubin: 0.5 mg/dL (ref 0.3–1.2)
Total Protein: 6.7 g/dL (ref 6.5–8.1)

## 2022-02-18 NOTE — Progress Notes (Signed)
Hematology and Oncology Follow Up Visit  Emma Lopez [id number] 05/14/1946 76 y.o. 02/18/2022   Principle Diagnosis:  Recurrent DVT of the left leg -- probable progression Positive lupus anticoagulant  Current Therapy:   Xarelto 20 mg p.o. daily -- d/c on 01/06/2021 Pradaxa 150 mg po BID - start on 01/06/2021 - d/c on 01/25/2021 Coumadin  -- dosed by Coumadin Clinic -- keep INR 2.5-3.5     Interim History:  Emma Lopez is in for follow-up.  [place]  [place] to [place] doctors appointments.  She has had quite a few this week.  She had [place] the mountains on [place].  She is doing well.  She is being followed by the Coumadin clinic.  Her last INR was 3.7.  She has had a mammogram recently.  This was back in June and everything was fine on the mammogram.  She has had no problems with respect to leg pain or swelling.  She [place] the mountains.  She been walking.  She has had no problems with chest wall pain.  There is no cough or shortness of breath.  She has had no nausea or vomiting.  She has had no rashes.  There is been no obvious leg pain, or swelling with the left leg.  Overall, I would say performance status is probably ECOG 1.    Medications:  Current Outpatient Medications:    ALPRAZolam (XANAX) 0.25 MG tablet, Take 1 tablet (0.25 mg total) by mouth every 8 (eight) hours as needed for anxiety., Disp: 30 tablet, Rfl: 0   B Complex Vitamins (B COMPLEX 1 PO), Take 1 each by mouth daily. sublingual, Disp: , Rfl:    Calcium Carbonate+Vitamin D 600-200 MG-UNIT TABS, Take 1 tablet by mouth daily., Disp: , Rfl:    denosumab (PROLIA) 60 MG/ML SOLN injection, Inject 60 mg into the skin every 6 (six) months. Administer in upper arm, thigh, or abdomen, Disp: 180 mL, Rfl: 2   DULoxetine (CYMBALTA) 60 MG capsule, Take 60 mg by mouth daily., Disp: , Rfl:    methocarbamol (ROBAXIN) 500 MG tablet, Take  250 mg by mouth daily as needed for muscle spasms., Disp: , Rfl:    Multiple Vitamins-Minerals (MULTIVITAMIN PO), Take 1 tablet by mouth daily., Disp: , Rfl:    Peppermint Oil 0.2 ML CPDR, Take 0.2 mLs by mouth daily as needed (IBS)., Disp: , Rfl:    rOPINIRole (REQUIP) 1 MG tablet, Take 1 mg by mouth at bedtime., Disp: , Rfl:    warfarin (COUMADIN) 1 MG tablet, TAKE 1/2 TO 1 TABLET BY MOUTH ONCE DAILY AS DIRECTED BY COUMADIN CLINIC (Patient taking differently: Take 1 mg by mouth daily. AS DIRECTED BY COUMADIN CLINIC), Disp: 90 tablet, Rfl: 0  Current Facility-Administered Medications:    denosumab (PROLIA) injection 60 mg, 60 mg, Subcutaneous, Once, Nunzio Cobbs, MD  Allergies:  Allergies  Allergen Reactions   Sulfa Antibiotics Rash and Other (See Comments)    Past Medical History, Surgical history, Social history, and Family History were reviewed and updated.  Review of Systems: Review of Systems  Constitutional: Negative.   HENT:  Negative.    Eyes: Negative.   Respiratory: Negative.    Cardiovascular: Negative.   Gastrointestinal: Negative.   Endocrine: Negative.   Genitourinary: Negative.    Musculoskeletal: Negative.   Skin: Negative.   Neurological: Negative.   Hematological:  Negative.   Psychiatric/Behavioral: Negative.      Physical Exam:  height is '5\' 2"'$  (1.575 m) and weight is 133 lb (60.3 kg). Her oral temperature is 98.1 F (36.7 C). Her blood pressure is 128/51 (abnormal) and her pulse is 56 (abnormal). Her respiration is 18 and oxygen saturation is 98%.   Wt Readings from Last 3 Encounters:  02/18/22 133 lb (60.3 kg)  01/11/22 135 lb (61.2 kg)  11/16/21 135 lb (61.2 kg)    Physical Exam Vitals reviewed.  HENT:     Head: Normocephalic and atraumatic.  Eyes:     Pupils: Pupils are equal, round, and reactive to light.  Cardiovascular:     Rate and Rhythm: Normal rate and regular rhythm.     Heart sounds: Normal heart sounds.  Pulmonary:      Effort: Pulmonary effort is normal.     Breath sounds: Normal breath sounds.  Abdominal:     General: Bowel sounds are normal.     Palpations: Abdomen is soft.  Musculoskeletal:        General: No tenderness or deformity. Normal range of motion.     Cervical back: Normal range of motion.     Comments: Her lower extremities show some swelling in the left lower leg.  There is nonpitting edema in the left ankle.  She has good pulses in the distal extremities.  No obvious venous cord is noted in the legs.  Lymphadenopathy:     Cervical: No cervical adenopathy.  Skin:    General: Skin is warm and dry.     Findings: No erythema or rash.  Neurological:     Mental Status: She is alert and oriented to person, place, and time.  Psychiatric:        Behavior: Behavior normal.        Thought Content: Thought content normal.        Judgment: Judgment normal.      Lab Results  Component Value Date   WBC 6.3 02/18/2022   HGB 14.1 02/18/2022   HCT 43.7 02/18/2022   MCV 94.4 02/18/2022   PLT 242 02/18/2022     Chemistry      Component Value Date/Time   NA 138 02/18/2022 1137   NA 142 01/03/2018 1601   K 4.0 02/18/2022 1137   CL 103 02/18/2022 1137   CO2 30 02/18/2022 1137   BUN 17 02/18/2022 1137   BUN 20 01/03/2018 1601   CREATININE 0.78 02/18/2022 1137      Component Value Date/Time   CALCIUM 8.9 02/18/2022 1137   ALKPHOS 68 02/18/2022 1137   AST 18 02/18/2022 1137   ALT 13 02/18/2022 1137   BILITOT 0.5 02/18/2022 1137      Impression and Plan: Emma Lopez is a very charming 76 year old white female.  She has a history of a DVT.  She was on maintenance Xarelto.  She was on lifelong therapy.  Subsequently had a acute thrombus in the left leg.  She does have the lupus anticoagulant which I believe is pathologic.  She will continue  on lifelong Coumadin.  Again, the Coumadin Clinic is doing a good job of monitoring her INR.  They are let us know of any adjustments that need to be  made with her Coumadin dose.  I do not see that we have to do any scans on her.  I do not see that we need any Dopplers.  I am very happy that she is able to [place].  I know this is very important for her to be able to be up there and have a wonderful quality of life.  She certainly takes advantage of all the amenities that [place] mountains have.  We will plan to get her back in the Fall now.   Volanda Napoleon, MD 7/21/202312:46 PM

## 2022-03-11 ENCOUNTER — Other Ambulatory Visit: Payer: Self-pay | Admitting: Cardiology

## 2022-03-11 DIAGNOSIS — I824Z2 Acute embolism and thrombosis of unspecified deep veins of left distal lower extremity: Secondary | ICD-10-CM

## 2022-03-28 ENCOUNTER — Ambulatory Visit: Payer: PPO | Attending: Cardiology | Admitting: *Deleted

## 2022-03-28 DIAGNOSIS — Z5181 Encounter for therapeutic drug level monitoring: Secondary | ICD-10-CM | POA: Diagnosis not present

## 2022-03-28 DIAGNOSIS — I82401 Acute embolism and thrombosis of unspecified deep veins of right lower extremity: Secondary | ICD-10-CM | POA: Diagnosis not present

## 2022-03-28 LAB — POCT INR: INR: 3.1 — AB (ref 2.0–3.0)

## 2022-03-28 NOTE — Patient Instructions (Signed)
Description   Continue taking Warfarin 1 tablet daily except for 1/2 tablet on Sundays, Tuesdays, and Thursdays. Recheck INR 4 weeks on the same day as other appts. [facility]in Clinic [phone].

## 2022-03-29 DIAGNOSIS — H40053 Ocular hypertension, bilateral: Secondary | ICD-10-CM | POA: Diagnosis not present

## 2022-03-29 DIAGNOSIS — H2513 Age-related nuclear cataract, bilateral: Secondary | ICD-10-CM | POA: Diagnosis not present

## 2022-04-25 DIAGNOSIS — E042 Nontoxic multinodular goiter: Secondary | ICD-10-CM | POA: Diagnosis not present

## 2022-04-25 DIAGNOSIS — E785 Hyperlipidemia, unspecified: Secondary | ICD-10-CM | POA: Diagnosis not present

## 2022-04-25 DIAGNOSIS — R7989 Other specified abnormal findings of blood chemistry: Secondary | ICD-10-CM | POA: Diagnosis not present

## 2022-04-25 DIAGNOSIS — R7309 Other abnormal glucose: Secondary | ICD-10-CM | POA: Diagnosis not present

## 2022-04-25 DIAGNOSIS — F419 Anxiety disorder, unspecified: Secondary | ICD-10-CM | POA: Diagnosis not present

## 2022-04-25 DIAGNOSIS — E559 Vitamin D deficiency, unspecified: Secondary | ICD-10-CM | POA: Diagnosis not present

## 2022-04-26 ENCOUNTER — Ambulatory Visit: Payer: PPO | Attending: Cardiology

## 2022-04-26 DIAGNOSIS — I82401 Acute embolism and thrombosis of unspecified deep veins of right lower extremity: Secondary | ICD-10-CM | POA: Diagnosis not present

## 2022-04-26 DIAGNOSIS — Z5181 Encounter for therapeutic drug level monitoring: Secondary | ICD-10-CM

## 2022-04-26 LAB — POCT INR: INR: 4.3 — AB (ref 2.0–3.0)

## 2022-04-26 NOTE — Patient Instructions (Signed)
HOLD TODAY ONLY and TAKE 0.5 TABLET WEDNESDAY then Continue taking Warfarin 1 tablet daily except for 1/2 tablet on Sundays, Tuesdays, and Thursdays. Recheck INR 2 weeks on the same day as other appts. [facility]in Clinic [phone].

## 2022-04-27 DIAGNOSIS — F419 Anxiety disorder, unspecified: Secondary | ICD-10-CM | POA: Diagnosis not present

## 2022-04-27 DIAGNOSIS — R7303 Prediabetes: Secondary | ICD-10-CM | POA: Diagnosis not present

## 2022-04-27 DIAGNOSIS — M810 Age-related osteoporosis without current pathological fracture: Secondary | ICD-10-CM | POA: Diagnosis not present

## 2022-04-27 DIAGNOSIS — Z1339 Encounter for screening examination for other mental health and behavioral disorders: Secondary | ICD-10-CM | POA: Diagnosis not present

## 2022-04-27 DIAGNOSIS — M5412 Radiculopathy, cervical region: Secondary | ICD-10-CM | POA: Diagnosis not present

## 2022-04-27 DIAGNOSIS — E785 Hyperlipidemia, unspecified: Secondary | ICD-10-CM | POA: Diagnosis not present

## 2022-04-27 DIAGNOSIS — F339 Major depressive disorder, recurrent, unspecified: Secondary | ICD-10-CM | POA: Diagnosis not present

## 2022-04-27 DIAGNOSIS — Z0000 Encounter for general adult medical examination without abnormal findings: Secondary | ICD-10-CM | POA: Diagnosis not present

## 2022-04-27 DIAGNOSIS — M797 Fibromyalgia: Secondary | ICD-10-CM | POA: Diagnosis not present

## 2022-04-27 DIAGNOSIS — Z1331 Encounter for screening for depression: Secondary | ICD-10-CM | POA: Diagnosis not present

## 2022-04-27 DIAGNOSIS — G319 Degenerative disease of nervous system, unspecified: Secondary | ICD-10-CM | POA: Diagnosis not present

## 2022-04-27 DIAGNOSIS — Z86718 Personal history of other venous thrombosis and embolism: Secondary | ICD-10-CM | POA: Diagnosis not present

## 2022-04-27 DIAGNOSIS — Z23 Encounter for immunization: Secondary | ICD-10-CM | POA: Diagnosis not present

## 2022-04-27 DIAGNOSIS — E042 Nontoxic multinodular goiter: Secondary | ICD-10-CM | POA: Diagnosis not present

## 2022-04-27 DIAGNOSIS — I700 Atherosclerosis of aorta: Secondary | ICD-10-CM | POA: Diagnosis not present

## 2022-05-04 ENCOUNTER — Other Ambulatory Visit: Payer: Self-pay | Admitting: Internal Medicine

## 2022-05-04 DIAGNOSIS — M5412 Radiculopathy, cervical region: Secondary | ICD-10-CM

## 2022-05-06 NOTE — Progress Notes (Signed)
GYNECOLOGY  VISIT   HPI: 76 y.o.   Single  Caucasian  female   G0P0 with No LMP recorded. Patient has had a hysterectomy.   here for  urinary urgency and frequency. Patient states that she is on Coumadin and her last I&R was out of range. She is having some lower abdominal pain as well. Has IBS, and her discomfort feels typical for IBS.   Having bleeding for one week.   With urination, she has some bleeding with blotting with toilet tissue.  No dysuria.  Increased urgency in the last week.  No hx renal stones.   No rectal bleeding.   GYNECOLOGIC HISTORY: No LMP recorded. Patient has had a hysterectomy. Contraception:  hysterectomy Menopausal hormone therapy:  none Last mammogram:  01-21-22 category a density birads 1:neg Last pap smear:   09-27-21 neg HPV HR neg, 08-19-20 neg        OB History     Gravida  0   Para  0   Term      Preterm      AB      Living         SAB      IAB      Ectopic      Multiple      Live Births                 Patient Active Problem List   Diagnosis Date Noted   Atrial tachycardia 11/16/2021   Chronic fatigue and malaise 11/16/2021   Encounter for therapeutic drug monitoring 02/02/2021   Acute deep vein thrombosis (DVT) of right lower extremity, unspecified vein ([place]) 01/25/2021   Aortic atherosclerosis ([place]) 11/17/2020   Acute DVT (deep venous thrombosis) ([place]) 11/13/2020   Depression with anxiety 11/13/2020   RLS (restless legs syndrome) 01/07/2020   Insomnia due to medical condition 01/07/2020   Nocturia more than twice per night 01/07/2020   Bradycardia 12/14/2017   Abnormal EKG    GERD (gastroesophageal reflux disease)    Dyspnea on exertion 10/30/2017   Family history of ovarian cancer 04/17/2017   Family history of breast cancer 04/17/2017   Family history of colon cancer 04/17/2017   Family history of pancreatic cancer 04/17/2017   Family history of melanoma 04/17/2017   Genetic testing 04/17/2017   CIN III  (cervical intraepithelial neoplasia III)    Arthritis    Fibromyalgia    PE (pulmonary embolism)    Thyroid disease    Osteoporosis    DVT (deep venous thrombosis) ([place]) 04/01/2004    Past Medical History:  Diagnosis Date   Abnormal EKG    Anxiety    Arthritis    oa   Bradycardia 12/14/2017   CIN III (cervical intraepithelial neoplasia III) 6812   Complication of anesthesia    did well last 2 times with procedures   Depression    DES exposure in utero    DVT (deep venous thrombosis) ([place]) 01/2009   LEFT LEG   DVT (deep venous thrombosis) ([place]) 10/30/2020   Dyspnea on exertion 10/2017   Elevated triglycerides with high cholesterol    Endometriosis    Fibromyalgia    GERD (gastroesophageal reflux disease)    History of colon polyps    History of hiatal hernia    told by some md has, some say not   IBS (irritable bowel syndrome) 2015   Left bundle branch block    intermittent   Lupus anticoagulant disorder ([place])  Multinodular goiter    Osteoporosis 12/2017   T score -3.2 stable from prior study   PE (pulmonary embolism)    Pneumonia 03/01/2021   PONV (postoperative nausea and vomiting)    Restless legs syndrome    Sinus bradycardia    Sleep disorder    Thyroid disease    HYPERTHYROIDISM   Vitamin D deficiency     Past Surgical History:  Procedure Laterality Date   ABDOMINAL HYSTERECTOMY  2001   TAH, partial   APPENDECTOMY  1978   COLONOSCOPY WITH PROPOFOL N/A 12/07/2015   Procedure: COLONOSCOPY WITH PROPOFOL;  Surgeon: Garlan Fair, MD;  Location: [facility];  Service: Endoscopy;  Laterality: N/A;   COLONOSCOPY WITH PROPOFOL N/A 01/11/2022   Procedure: COLONOSCOPY WITH PROPOFOL;  Surgeon: Ronnette Juniper, MD;  Location: [facility];  Service: Gastroenterology;  Laterality: N/A;   colonscopy  7 yrs ago   other in past   ESOPHAGOGASTRODUODENOSCOPY (EGD) WITH PROPOFOL N/A 12/07/2015   Procedure: ESOPHAGOGASTRODUODENOSCOPY (EGD) WITH PROPOFOL;  Surgeon:  Garlan Fair, MD;  Location: [facility];  Service: Endoscopy;  Laterality: N/A;   ESOPHAGOGASTRODUODENOSCOPY ENDOSCOPY  yrs ago   FACIAL COSMETIC [place]  01/11/2022   Procedure: HEMOSTASIS CLIP PLACEMENT;  Surgeon: Ronnette Juniper, MD;  Location: [facility];  Service: Gastroenterology;;   HOT HEMOSTASIS N/A 01/11/2022   Procedure: HOT HEMOSTASIS (ARGON PLASMA COAGULATION/BICAP);  Surgeon: Ronnette Juniper, MD;  Location: [facility];  Service: Gastroenterology;  Laterality: N/A;   MYOMECTOMY     POLYPECTOMY  01/11/2022   Procedure: POLYPECTOMY;  Surgeon: Ronnette Juniper, MD;  Location: [facility];  Service: Gastroenterology;;   REPLACEMENT TOTAL JOINT WRIST W/ PROSTHETIC IMPLANT Right 08/01/2021   TONSILLECTOMY      Current Outpatient Medications  Medication Sig Dispense Refill   ALPRAZolam (XANAX) 0.25 MG tablet Take 1 tablet (0.25 mg total) by mouth every 8 (eight) hours as needed for anxiety. 30 tablet 0   B Complex Vitamins (B COMPLEX 1 PO) Take 1 each by mouth daily. sublingual     Calcium Carbonate+Vitamin D 600-200 MG-UNIT TABS Take 1 tablet by mouth daily.     denosumab (PROLIA) 60 MG/ML SOLN injection Inject 60 mg into the skin every 6 (six) months. Administer in upper arm, thigh, or abdomen 180 mL 2   escitalopram (LEXAPRO) 10 MG tablet Take 10 mg by mouth daily.     methocarbamol (ROBAXIN) 500 MG tablet Take 250 mg by mouth daily as needed for muscle spasms.     Multiple Vitamins-Minerals (MULTIVITAMIN PO) Take 1 tablet by mouth daily.     rOPINIRole (REQUIP) 1 MG tablet Take 1 mg by mouth at bedtime.     rosuvastatin (CRESTOR) 5 MG tablet Take 5 mg by mouth at bedtime.     warfarin (COUMADIN) 1 MG tablet TAKE 1/2 TO 1 TABLET BY MOUTH ONCE DAILY AS DIRECTED BY COUMADIN CLINIC 90 tablet 0   Peppermint Oil 0.2 ML CPDR Take 0.2 mLs by mouth daily as needed (IBS). (Patient not taking: Reported on 05/09/2022)     Current  Facility-Administered Medications  Medication Dose Route Frequency Provider Last Rate Last Admin   denosumab (PROLIA) injection 60 mg  60 mg Subcutaneous Once Nunzio Cobbs, MD         ALLERGIES: Sulfa antibiotics  Family History  Problem Relation Age of Onset   Hypertension Mother    Breast cancer Mother  43's   Cancer Father        COLON   Hypertension Sister    Stroke Sister    Breast cancer Maternal Aunt        Age 58's   Cancer Maternal Aunt        Melanoma   Alzheimer's disease Maternal Aunt    Cancer Paternal Aunt        OVARIAN and COLON   Breast cancer Maternal Aunt        70's   Cancer Maternal Aunt        Colon CA   Alzheimer's disease Maternal Aunt     Social History   Socioeconomic History   Marital status: Single    Spouse name: Not on file   Number of children: Not on file   Years of education: Not on file   Highest education level: Not on file  Occupational History   Not on file  Tobacco Use   Smoking status: Former    Packs/day: 0.10    Years: 6.00    Total pack years: 0.60    Types: Cigarettes    Quit date: 08/01/1978    Years since quitting: 43.8   Smokeless tobacco: Never   Tobacco comments:    only smoked 4-6 yrs 1 pack per month  Vaping Use   Vaping Use: Never used  Substance and Sexual Activity   Alcohol use: Yes    Comment: 1 drink per month   Drug use: No   Sexual activity: Not Currently    Birth control/protection: Surgical    Comment: HYSTERECTOMY-1st intercourse 27-Fewer than 5 partners  Other Topics Concern   Not on file  Social History Narrative   Not on file   Social Determinants of Health   Financial Resource Strain: Not on file  Food Insecurity: Not on file  Transportation Needs: Not on file  Physical Activity: Not on file  Stress: Not on file  Social Connections: Not on file  Intimate Partner Violence: Not on file    Review of Systems  Genitourinary:  Positive for dysuria and hematuria.   All other systems reviewed and are negative.   PHYSICAL EXAMINATION:    There were no vitals taken for this visit.    General appearance: alert, cooperative and appears stated age  Pelvic: External genitalia:  no lesions              Urethra:  normal appearing urethra with no masses, tenderness or lesions              Bartholins and Skenes: normal                 Vagina: normal appearing vagina with normal color and discharge, no lesions              Cervix: absent Bimanual Exam:  Uterus:  absent              Adnexa: no mass, fullness, tenderness              Rectal exam: Yes.  .  Confirms.              Anus:  normal sphincter tone, no lesions  Catheter collection of urine after receiving verbal permission.  Sterile prep with betadine.  Collected approximately 15 cc of clear yellow urine with small white flocks.  Chaperone was present for exam:  Eustace Pen, CMA  ASSESSMENT  Status post hysterectomy.  Hematuria.  FH ovarian cancer.  On  Coumadin.  PLAN  Urinalysis shows:  sg 1.020, ph 5.5, NS WBC, NS RBC, 0 - 5 epis, few bacteria, few mucus. UC sent.  Vaseline or Aquafor to vulvar. Pelvic in April, 2023 unless patient has persistent discomfort.  We will then it sooner.  I recommend she return for recurrent bleeding.    An After Visit Summary was printed and given to the patient.  29 min  total time was spent for this patient encounter, including preparation, face-to-face counseling with the patient, coordination of care, and documentation of the encounter.

## 2022-05-07 ENCOUNTER — Ambulatory Visit: Payer: PPO

## 2022-05-07 ENCOUNTER — Ambulatory Visit
Admission: RE | Admit: 2022-05-07 | Discharge: 2022-05-07 | Disposition: A | Discharge status: Home / Self Care | Payer: PPO | Source: Ambulatory Visit | Attending: Internal Medicine | Admitting: Internal Medicine

## 2022-05-07 DIAGNOSIS — M542 Cervicalgia: Secondary | ICD-10-CM | POA: Diagnosis not present

## 2022-05-07 DIAGNOSIS — R202 Paresthesia of skin: Secondary | ICD-10-CM | POA: Diagnosis not present

## 2022-05-07 DIAGNOSIS — R200 Anesthesia of skin: Secondary | ICD-10-CM | POA: Diagnosis not present

## 2022-05-07 DIAGNOSIS — M5412 Radiculopathy, cervical region: Secondary | ICD-10-CM

## 2022-05-07 DIAGNOSIS — M4802 Spinal stenosis, cervical region: Secondary | ICD-10-CM | POA: Diagnosis not present

## 2022-05-09 ENCOUNTER — Encounter: Payer: Self-pay | Admitting: Obstetrics and Gynecology

## 2022-05-09 ENCOUNTER — Ambulatory Visit: Payer: PPO | Admitting: Obstetrics and Gynecology

## 2022-05-09 DIAGNOSIS — R1032 Left lower quadrant pain: Secondary | ICD-10-CM

## 2022-05-09 DIAGNOSIS — R350 Frequency of micturition: Secondary | ICD-10-CM | POA: Diagnosis not present

## 2022-05-09 DIAGNOSIS — R1031 Right lower quadrant pain: Secondary | ICD-10-CM

## 2022-05-10 ENCOUNTER — Ambulatory Visit: Payer: PPO

## 2022-05-10 ENCOUNTER — Other Ambulatory Visit: Payer: PPO

## 2022-05-12 ENCOUNTER — Ambulatory Visit: Payer: PPO | Attending: Cardiology | Admitting: Student

## 2022-05-12 DIAGNOSIS — Z5181 Encounter for therapeutic drug level monitoring: Secondary | ICD-10-CM

## 2022-05-12 DIAGNOSIS — I82401 Acute embolism and thrombosis of unspecified deep veins of right lower extremity: Secondary | ICD-10-CM

## 2022-05-12 LAB — POCT INR: INR: 3.2 — AB (ref 2.0–3.0)

## 2022-05-12 NOTE — Patient Instructions (Signed)
Continue taking Warfarin 1 tablet daily except for 1/2 tablet on Sundays, Tuesdays, and Thursdays. Recheck INR 3 weeks on the same day as other appts. Coumadin Clinic [phone].

## 2022-05-16 DIAGNOSIS — R200 Anesthesia of skin: Secondary | ICD-10-CM | POA: Diagnosis not present

## 2022-05-16 DIAGNOSIS — Z6825 Body mass index (BMI) 25.0-25.9, adult: Secondary | ICD-10-CM | POA: Diagnosis not present

## 2022-05-26 LAB — URINALYSIS, COMPLETE W/RFL CULTURE
Bilirubin Urine: NEGATIVE
Casts: NONE SEEN /LPF
Crystals: NONE SEEN /HPF
Glucose, UA: NEGATIVE
Hgb urine dipstick: NEGATIVE
Hyaline Cast: NONE SEEN /LPF
Ketones, ur: NEGATIVE
Leukocyte Esterase: NEGATIVE
Nitrites, Initial: NEGATIVE
Protein, ur: NEGATIVE
RBC / HPF: NONE SEEN /HPF (ref 0–2)
Specific Gravity, Urine: 1.02 (ref 1.001–1.035)
WBC, UA: NONE SEEN /HPF (ref 0–5)
Yeast: NONE SEEN /HPF
pH: 5.5 (ref 5.0–8.0)

## 2022-05-26 LAB — URINE CULTURE
MICRO NUMBER:: 14025296
Result:: NO GROWTH
SPECIMEN QUALITY:: ADEQUATE

## 2022-05-26 LAB — CULTURE INDICATED

## 2022-06-02 ENCOUNTER — Ambulatory Visit: Payer: PPO | Attending: Cardiology | Admitting: *Deleted

## 2022-06-02 DIAGNOSIS — Z5181 Encounter for therapeutic drug level monitoring: Secondary | ICD-10-CM

## 2022-06-02 DIAGNOSIS — I824Z2 Acute embolism and thrombosis of unspecified deep veins of left distal lower extremity: Secondary | ICD-10-CM

## 2022-06-02 DIAGNOSIS — I82401 Acute embolism and thrombosis of unspecified deep veins of right lower extremity: Secondary | ICD-10-CM | POA: Diagnosis not present

## 2022-06-02 LAB — POCT INR: INR: 3.9 — AB (ref 2.0–3.0)

## 2022-06-02 MED ORDER — WARFARIN SODIUM 1 MG PO TABS: ORAL_TABLET | ORAL | 1 refills | Status: DC

## 2022-06-02 NOTE — Patient Instructions (Signed)
Description   Do not take any warfarin today then continue taking Warfarin 1 tablet daily except for 1/2 tablet on Sundays, Tuesdays, and Thursdays. Recheck INR 3 weeks on the same day as other appts. Coumadin Clinic [phone].

## 2022-06-03 ENCOUNTER — Inpatient Hospital Stay (HOSPITAL_BASED_OUTPATIENT_CLINIC_OR_DEPARTMENT_OTHER): Payer: PPO | Admitting: Family

## 2022-06-03 ENCOUNTER — Encounter: Payer: Self-pay | Admitting: Family

## 2022-06-03 ENCOUNTER — Telehealth: Payer: Self-pay | Admitting: *Deleted

## 2022-06-03 ENCOUNTER — Inpatient Hospital Stay: Payer: PPO | Attending: Hematology & Oncology

## 2022-06-03 VITALS — BP 121/44 | HR 96 | Temp 97.7°F | Resp 17 | Ht 62.0 in | Wt 131.0 lb

## 2022-06-03 DIAGNOSIS — Z79899 Other long term (current) drug therapy: Secondary | ICD-10-CM | POA: Insufficient documentation

## 2022-06-03 DIAGNOSIS — D6862 Lupus anticoagulant syndrome: Secondary | ICD-10-CM | POA: Diagnosis not present

## 2022-06-03 DIAGNOSIS — Z7901 Long term (current) use of anticoagulants: Secondary | ICD-10-CM | POA: Diagnosis not present

## 2022-06-03 DIAGNOSIS — Z86718 Personal history of other venous thrombosis and embolism: Secondary | ICD-10-CM | POA: Diagnosis not present

## 2022-06-03 DIAGNOSIS — I82401 Acute embolism and thrombosis of unspecified deep veins of right lower extremity: Secondary | ICD-10-CM

## 2022-06-03 LAB — CMP (CANCER CENTER ONLY)
ALT: 14 U/L (ref 0–44)
AST: 19 U/L (ref 15–41)
Albumin: 4 g/dL (ref 3.5–5.0)
Alkaline Phosphatase: 73 U/L (ref 38–126)
Anion gap: 6 (ref 5–15)
BUN: 20 mg/dL (ref 8–23)
CO2: 30 mmol/L (ref 22–32)
Calcium: 9.1 mg/dL (ref 8.9–10.3)
Chloride: 105 mmol/L (ref 98–111)
Creatinine: 0.78 mg/dL (ref 0.44–1.00)
GFR, Estimated: >60 mL/min (ref >60)
Glucose, Bld: 104 mg/dL — ABNORMAL HIGH (ref 70–99)
Potassium: 4.1 mmol/L (ref 3.5–5.1)
Sodium: 141 mmol/L (ref 135–145)
Total Bilirubin: 0.5 mg/dL (ref 0.3–1.2)
Total Protein: 6.8 g/dL (ref 6.5–8.1)

## 2022-06-03 LAB — CBC WITH DIFFERENTIAL (CANCER CENTER ONLY)
Abs Immature Granulocytes: 0.03 10*3/uL (ref 0.00–0.07)
Basophils Absolute: 0.1 10*3/uL (ref 0.0–0.1)
Basophils Relative: 1 %
Eosinophils Absolute: 0.2 10*3/uL (ref 0.0–0.5)
Eosinophils Relative: 3 %
HCT: 43.5 % (ref 36.0–46.0)
Hemoglobin: 14.1 g/dL (ref 12.0–15.0)
Immature Granulocytes: 1 %
Lymphocytes Relative: 23 %
Lymphs Abs: 1.5 10*3/uL (ref 0.7–4.0)
MCH: 30.7 pg (ref 26.0–34.0)
MCHC: 32.4 g/dL (ref 30.0–36.0)
MCV: 94.8 fL (ref 80.0–100.0)
Monocytes Absolute: 0.6 10*3/uL (ref 0.1–1.0)
Monocytes Relative: 9 %
Neutro Abs: 4.2 10*3/uL (ref 1.7–7.7)
Neutrophils Relative %: 63 %
Platelet Count: 212 10*3/uL (ref 150–400)
RBC: 4.59 MIL/uL (ref 3.87–5.11)
RDW: 13.2 % (ref 11.5–15.5)
WBC Count: 6.6 10*3/uL (ref 4.0–10.5)
nRBC: 0 % (ref 0.0–0.2)

## 2022-06-03 LAB — D-DIMER, QUANTITATIVE: D-Dimer, Quant: 0.33 ug/mL-FEU (ref 0.00–0.50)

## 2022-06-03 NOTE — Progress Notes (Signed)
Hematology and Oncology Follow Up Visit  Emma Lopez [id number] [birth year] 77 y.o. 06/03/2022   Principle Diagnosis:  Recurrent DVT of the left leg -- probable progression Positive lupus anticoagulant  Past Therapy: Xarelto 20 mg p.o. daily -- d/c on 01/06/2021 Pradaxa 150 mg po BID - start on 01/06/2021 - d/c on 01/25/2021   Current Therapy:        Coumadin  -- dosed by [facility] -- keep INR 2.5-3.5   Interim History:  Emma Lopez is here today for follow-up. She is doing well overall. She switched to taking her Lexapro in the evening and this seems to have improved her daily energy.  Her INR yesterday was 3.9 so she held yesterdays dose of Coumadin and will recheck with the clinic in 2 weeks.  She has occasional small amount of blood on her toilet tissue and states that this amy be due to vaginal dryness.  No other blood loss noted. No bruising or petechiae.  No fever, chills, n/v, cough, rash, dizziness, SOB, chest pain, palpitations, abdominal pain or changes in bowel or bladder habits.  No swelling in her extremities at this time.  She has intermittent tingling in her right arm and burning in her right and left palms. She has seen a neurologist and work up so far has been negative.   ECOG Performance Status: 1 - Symptomatic but completely ambulatory  Medications:  Allergies as of 06/03/2022       Reactions   Sulfa Antibiotics Rash, Other (See Comments)        Medication List        Accurate as of June 03, 2022 12:13 PM. If you have any questions, ask your nurse or doctor.          ALPRAZolam 0.25 MG tablet Commonly known as: XANAX Take 1 tablet (0.25 mg total) by mouth every 8 (eight) hours as needed for anxiety.   B COMPLEX 1 PO Take 1 each by mouth daily. sublingual   Calcium Carbonate+Vitamin D 600-200 MG-UNIT Tabs Take 1 tablet by mouth daily.   denosumab 60 MG/ML Soln injection Commonly known as: PROLIA Inject 60 mg into the skin every 6  (six) months. Administer in upper arm, thigh, or abdomen   escitalopram 10 MG tablet Commonly known as: LEXAPRO Take 10 mg by mouth daily.   methocarbamol 500 MG tablet Commonly known as: ROBAXIN Take 250 mg by mouth daily as needed for muscle spasms.   MULTIVITAMIN PO Take 1 tablet by mouth daily.   Peppermint Oil 0.2 ML Cpdr Take 0.2 mLs by mouth daily as needed (IBS).   rOPINIRole 1 MG tablet Commonly known as: REQUIP Take 1 mg by mouth at bedtime.   rosuvastatin 5 MG tablet Commonly known as: CRESTOR Take 5 mg by mouth at bedtime.   warfarin 1 MG tablet Commonly known as: COUMADIN Take as directed by the anticoagulation clinic. If you are unsure how to take this medication, talk to your nurse or doctor. Original instructions: Take 1/2 tablet to 1 tablet by mouth daily or as directed by Anticoagulation Clinic.        Allergies:  Allergies  Allergen Reactions   Sulfa Antibiotics Rash and Other (See Comments)    Past Medical History, Surgical history, Social history, and Family History were reviewed and updated.  Review of Systems: All other 10 point review of systems is negative.   Physical Exam:  height is '5\' 2"'$  (1.575 m) and weight is 131 lb (59.4 kg).  Her oral temperature is 97.7 F (36.5 C). Her blood pressure is 121/44 (abnormal) and her pulse is 96. Her respiration is 17 and oxygen saturation is 100%.   Wt Readings from Last 3 Encounters:  06/03/22 131 lb (59.4 kg)  02/18/22 133 lb (60.3 kg)  01/11/22 135 lb (61.2 kg)    Ocular: Sclerae unicteric, pupils equal, round and reactive to light Ear-nose-throat: Oropharynx clear, dentition fair Lymphatic: No cervical or supraclavicular adenopathy Lungs no rales or rhonchi, good excursion bilaterally Heart regular rate and rhythm, no murmur appreciated Abd soft, nontender, positive bowel sounds MSK no focal spinal tenderness, no joint edema Neuro: non-focal, well-oriented, appropriate affect Breasts:  Deferred   Lab Results  Component Value Date   WBC 6.6 06/03/2022   HGB 14.1 06/03/2022   HCT 43.5 06/03/2022   MCV 94.8 06/03/2022   PLT 212 06/03/2022   Lab Results  Component Value Date   FERRITIN 69 07/05/2021   IRON 111 12/04/2019   TIBC 351 12/04/2019   UIBC 240 12/04/2019   IRONPCTSAT 32 12/04/2019   Lab Results  Component Value Date   RBC 4.59 06/03/2022   No results found for: "KPAFRELGTCHN", "LAMBDASER", "KAPLAMBRATIO" No results found for: "IGGSERUM", "IGA", "IGMSERUM" No results found for: "TOTALPROTELP", "ALBUMINELP", "A1GS", "A2GS", "BETS", "BETA2SER", "GAMS", "MSPIKE", "SPEI"   Chemistry      Component Value Date/Time   NA 138 02/18/2022 1137   NA 142 01/03/2018 1601   K 4.0 02/18/2022 1137   CL 103 02/18/2022 1137   CO2 30 02/18/2022 1137   BUN 17 02/18/2022 1137   BUN 20 01/03/2018 1601   CREATININE 0.78 02/18/2022 1137      Component Value Date/Time   CALCIUM 8.9 02/18/2022 1137   ALKPHOS 68 02/18/2022 1137   AST 18 02/18/2022 1137   ALT 13 02/18/2022 1137   BILITOT 0.5 02/18/2022 1137       Impression and Plan: Emma Lopez is a very pleasant 76 yo caucasian female with history of recurrent DVT and positive lupus anticoagulant.  She is now on Coumadin lifelong managed with the Coumadin clinic.  She continues to do well. No questions or concerns at this time.  Follow-up in 4 months.    Lottie Dawson, NP 11/3/202312:13 PM

## 2022-06-03 NOTE — Telephone Encounter (Signed)
Per 06/03/22 los - gave upcoming appointments - confirmed

## 2022-06-20 ENCOUNTER — Ambulatory Visit: Payer: PPO | Attending: Cardiology

## 2022-06-20 DIAGNOSIS — Z5181 Encounter for therapeutic drug level monitoring: Secondary | ICD-10-CM

## 2022-06-20 DIAGNOSIS — I82401 Acute embolism and thrombosis of unspecified deep veins of right lower extremity: Secondary | ICD-10-CM

## 2022-06-20 LAB — POCT INR: INR: 3.8 — AB (ref 2.0–3.0)

## 2022-06-20 NOTE — Patient Instructions (Signed)
Do not take any warfarin today then continue taking Warfarin 1 tablet daily except for 1/2 tablet on Sundays, Tuesdays, and Thursdays. Recheck INR 3 weeks.  [facility] Clinic [phone].

## 2022-06-30 IMAGING — US US EXTREM LOW VENOUS*L*
1 series · 13 of 24 positions shown · non-contrast
Comparison: 01/06/2021

CLINICAL DATA: 75-year-old female with left leg DVT



[Series 1: us extrem low venous*left* · 13 of 41 slices shown]
[im 1/41]
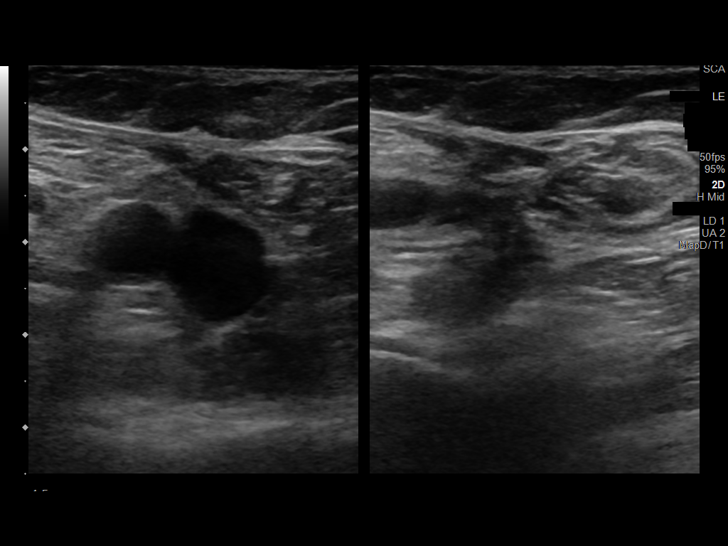
[im 4/41]
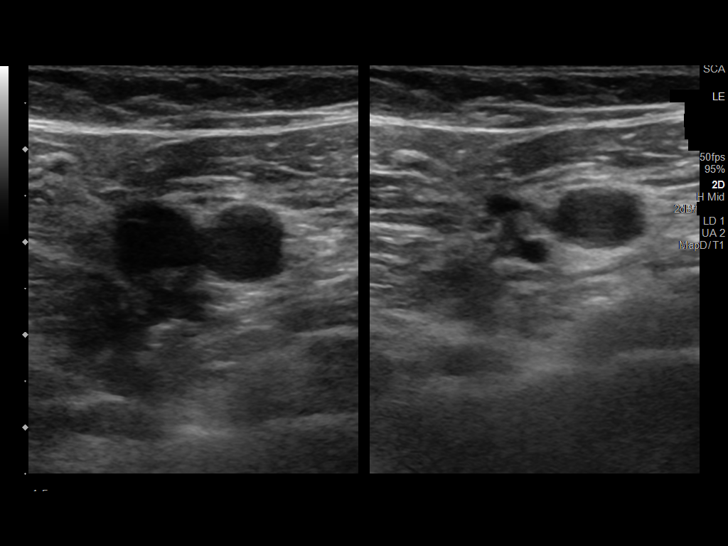
[im 7/41]
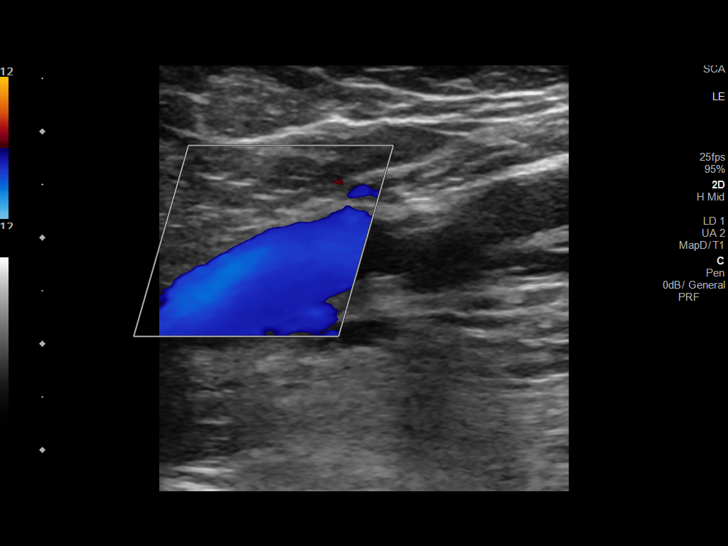
[im 11/41]
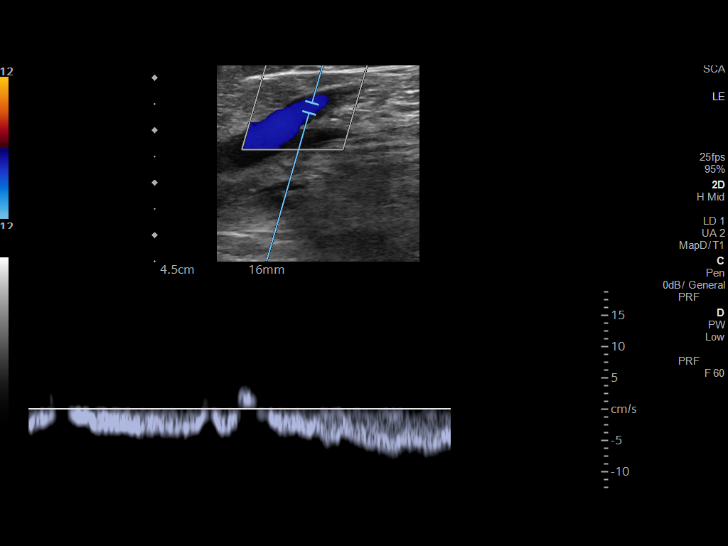
[im 14/41]
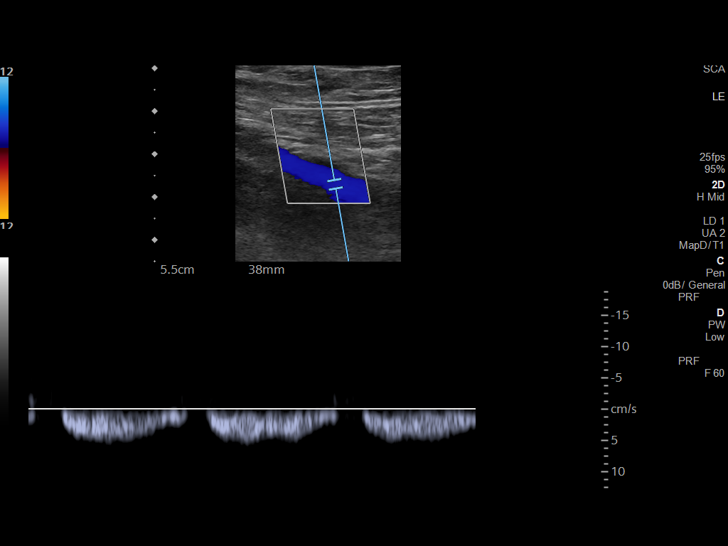
[im 18/41]
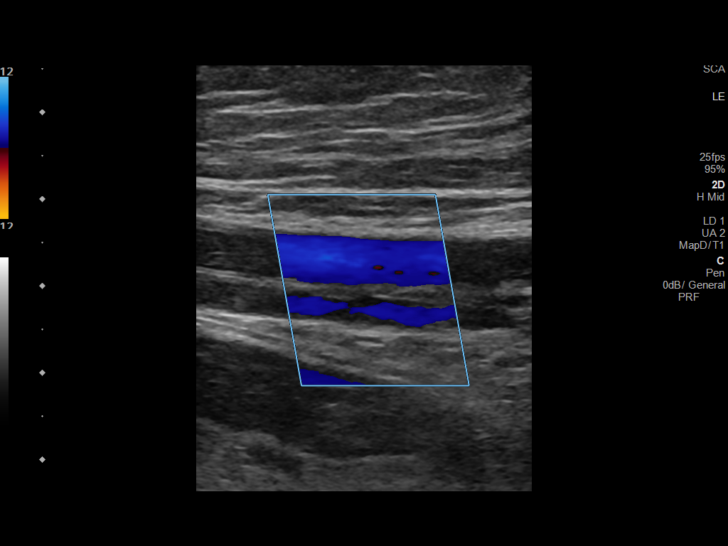
[im 21/41]
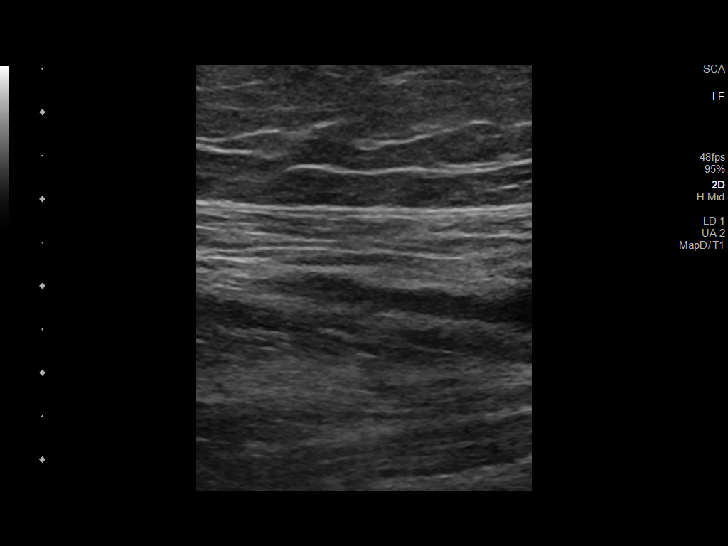
[im 23/41]
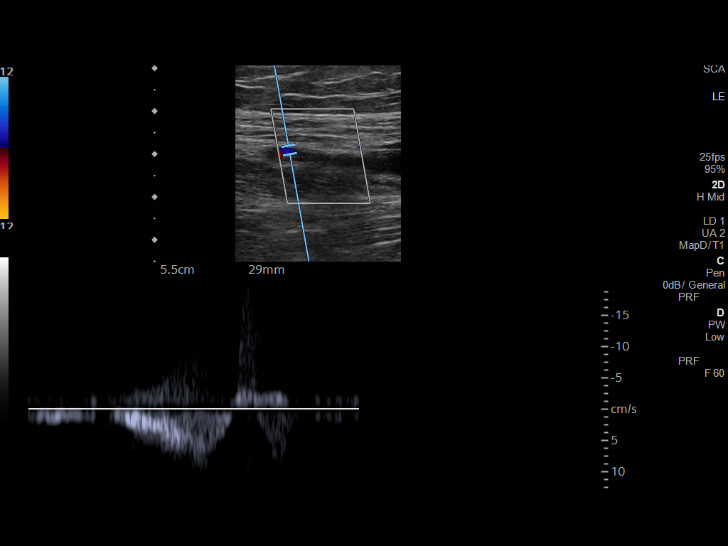
[im 27/41]
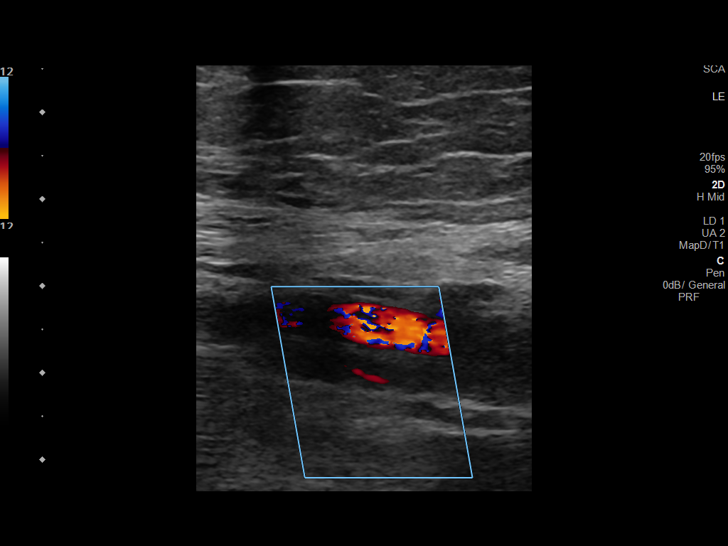
[im 30/41]
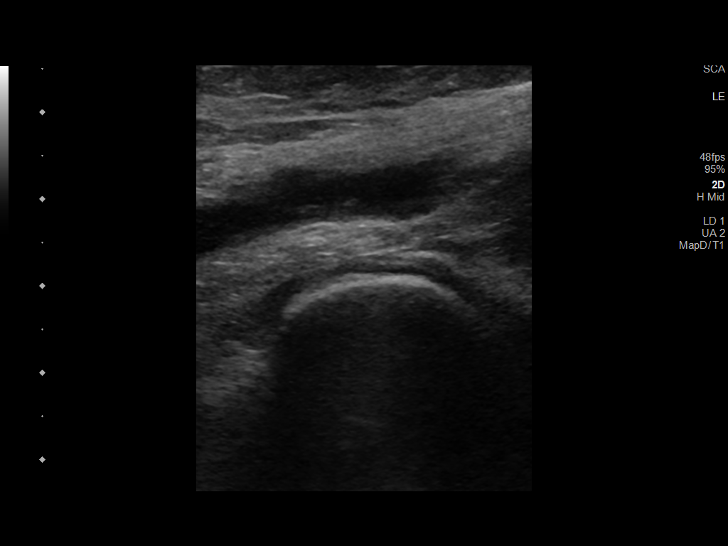
[im 34/41]
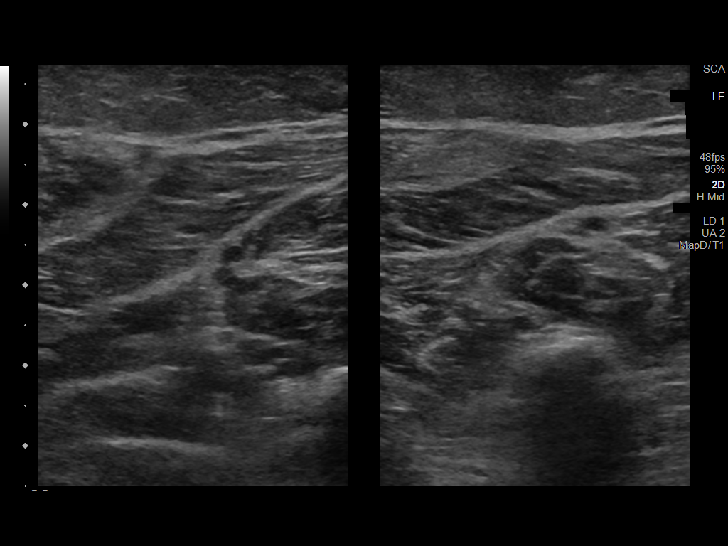
[im 37/41]
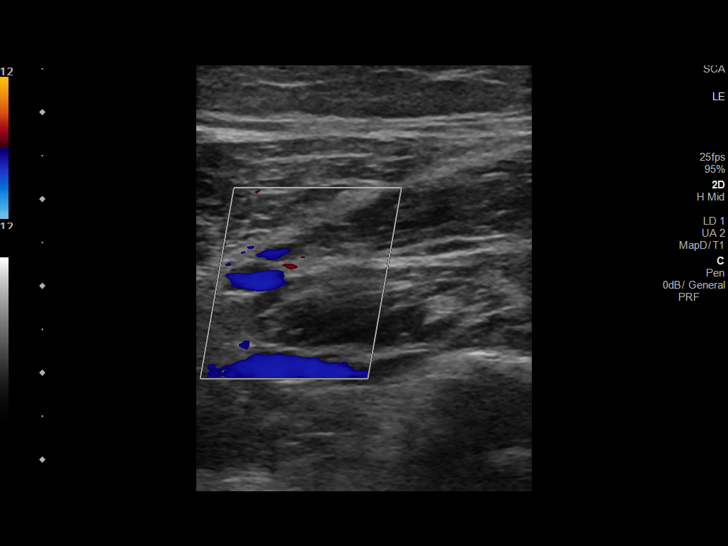
[im 41/41]
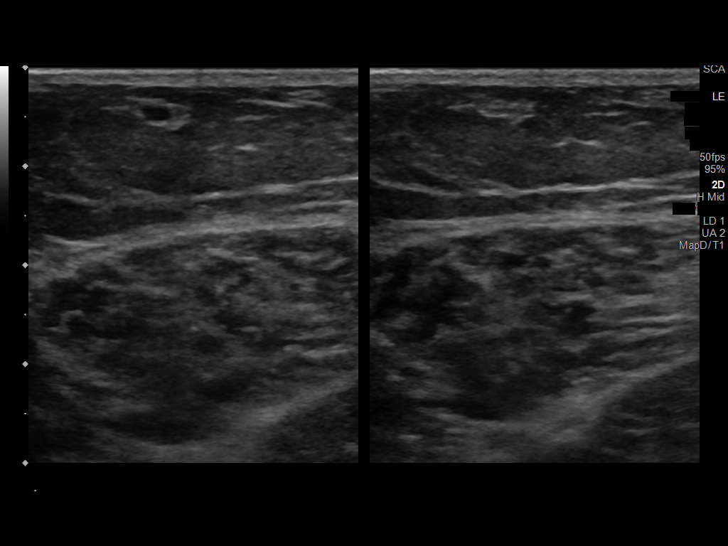

[13 of 24 positions shown; findings below may reference images not displayed]

FINDINGS: Contralateral Common Femoral Vein: Respiratory phasicity is normal
and symmetric with the symptomatic side. No evidence of thrombus.
Normal compressibility.

Common Femoral Vein: Incomplete compressibility of the left common
femoral vein with flow maintained and wall thickening with a similar
appearance to 01/06/2021. No new thrombus identified.

Saphenofemoral Junction: No evidence of thrombus. Normal
compressibility and flow on color Doppler imaging.

Profunda Femoral Vein: No evidence of thrombus. Normal
compressibility and flow on color Doppler imaging.

Femoral Vein: Incomplete compressibility of the femoral vein with no
new thrombus. Flow maintained with wall thickening. Similar
appearance to the study of 01/06/2021.

Popliteal Vein: Partially recanalized popliteal vein with increased
flow compared to the prior. Incomplete compressibility with wall
thickening. No occlusive thrombus.

Calf Veins: No evidence of thrombus. Normal compressibility and flow
on color Doppler imaging.

Superficial Great Saphenous Vein: No evidence of thrombus. Normal
compressibility and flow on color Doppler imaging.

Other Findings:  None.
IMPRESSION: Left lower extremity venous duplex demonstrates changes of chronic
recanalized DVT of the common femoral vein, femoral vein, popliteal
vein, with no changes of acute DVT on the current study.

## 2022-07-12 ENCOUNTER — Telehealth: Payer: Self-pay | Admitting: *Deleted

## 2022-07-12 ENCOUNTER — Ambulatory Visit: Payer: PPO | Admitting: Physician Assistant

## 2022-07-12 ENCOUNTER — Ambulatory Visit: Payer: PPO

## 2022-07-12 NOTE — Telephone Encounter (Addendum)
Deductible n/a  OOP MAX 3200 (1,479.14)  Annual exam 09/27/2021  Calcium   9.0          Date 11/12/2021  Upcoming dental procedures   Prior Authorization needed Not required Reference # [id number]  Pt estimated Cost $301  Appt 07/27/2022   Pt will call me back to look at calendar message 07/12/2022   Coverage Details: 20% per medicare rep

## 2022-07-13 ENCOUNTER — Ambulatory Visit: Payer: PPO | Attending: Cardiovascular Disease

## 2022-07-13 DIAGNOSIS — Z5181 Encounter for therapeutic drug level monitoring: Secondary | ICD-10-CM | POA: Diagnosis not present

## 2022-07-13 LAB — POCT INR: INR: 3.4 — AB (ref 2.0–3.0)

## 2022-07-13 NOTE — Patient Instructions (Signed)
Description   Continue taking Warfarin 1 tablet daily except for 1/2 tablet on Sundays, Tuesdays, and Thursdays.  Recheck INR 4 weeks.   [facility] [phone]

## 2022-07-22 ENCOUNTER — Encounter: Payer: Self-pay | Admitting: Neurology

## 2022-07-27 ENCOUNTER — Ambulatory Visit (INDEPENDENT_AMBULATORY_CARE_PROVIDER_SITE_OTHER): Payer: PPO

## 2022-07-27 DIAGNOSIS — M810 Age-related osteoporosis without current pathological fracture: Secondary | ICD-10-CM

## 2022-07-27 MED ORDER — DENOSUMAB 60 MG/ML ~~LOC~~ SOSY
60.0000 mg | PREFILLED_SYRINGE | Freq: Once | SUBCUTANEOUS | Status: AC
  Administered 2022-07-27: 60 mg via SUBCUTANEOUS

## 2022-07-27 MED ORDER — DENOSUMAB 60 MG/ML ~~LOC~~ SOSY: 60.0000 mg | PREFILLED_SYRINGE | Freq: Once | SUBCUTANEOUS | Status: DC

## 2022-07-27 NOTE — Progress Notes (Deleted)
GYNECOLOGY  VISIT   HPI: 76 y.o.   Single  Caucasian  female   G0P0 with No LMP recorded. Patient has had a hysterectomy.   here for   lower abdomen pain  GYNECOLOGIC HISTORY: No LMP recorded. Patient has had a hysterectomy. Contraception:  hysterectomy Menopausal hormone therapy:  n/a Last mammogram:  01/25/22 Breast Density Category A, BI-RADS CATEGORY 1 Negative Last pap smear:   09/27/21 negative: HR HPV negative        OB History     Gravida  0   Para  0   Term      Preterm      AB      Living         SAB      IAB      Ectopic      Multiple      Live Births                 Patient Active Problem List   Diagnosis Date Noted   Atrial tachycardia 11/16/2021   Chronic fatigue and malaise 11/16/2021   Encounter for therapeutic drug monitoring 02/02/2021   Acute deep vein thrombosis (DVT) of right lower extremity, unspecified vein ([place]) 01/25/2021   Aortic atherosclerosis ([place]) 11/17/2020   Acute DVT (deep venous thrombosis) ([place]) 11/13/2020   Depression with anxiety 11/13/2020   RLS (restless legs syndrome) 01/07/2020   Insomnia due to medical condition 01/07/2020   Nocturia more than twice per night 01/07/2020   Bradycardia 12/14/2017   Abnormal EKG    GERD (gastroesophageal reflux disease)    Dyspnea on exertion 10/30/2017   Family history of ovarian cancer 04/17/2017   Family history of breast cancer 04/17/2017   Family history of colon cancer 04/17/2017   Family history of pancreatic cancer 04/17/2017   Family history of melanoma 04/17/2017   Genetic testing 04/17/2017   CIN III (cervical intraepithelial neoplasia III)    Arthritis    Fibromyalgia    PE (pulmonary embolism)    Thyroid disease    Osteoporosis    DVT (deep venous thrombosis) ([place]) 04/01/2004    Past Medical History:  Diagnosis Date   Abnormal EKG    Anxiety    Arthritis    oa   Bradycardia 12/14/2017   CIN III (cervical intraepithelial neoplasia III) 2595    Complication of anesthesia    did well last 2 times with procedures   Depression    DES exposure in utero    DVT (deep venous thrombosis) ([place]) 01/2009   LEFT LEG   DVT (deep venous thrombosis) ([place]) 10/30/2020   Dyspnea on exertion 10/2017   Elevated triglycerides with high cholesterol    Endometriosis    Fibromyalgia    GERD (gastroesophageal reflux disease)    History of colon polyps    History of hiatal hernia    told by some md has, some say not   IBS (irritable bowel syndrome) 2015   Left bundle branch block    intermittent   Lupus anticoagulant disorder ([place])    Multinodular goiter    Osteoporosis 12/2017   T score -3.2 stable from prior study   PE (pulmonary embolism)    Pneumonia 03/01/2021   PONV (postoperative nausea and vomiting)    Restless legs syndrome    Sinus bradycardia    Sleep disorder    Thyroid disease    HYPERTHYROIDISM   Vitamin D deficiency     Past Surgical History:  Procedure  Laterality Date   ABDOMINAL HYSTERECTOMY  2001   TAH, partial   APPENDECTOMY  1978   COLONOSCOPY WITH PROPOFOL N/A 12/07/2015   Procedure: COLONOSCOPY WITH PROPOFOL;  Surgeon: Garlan Fair, MD;  Location: [facility];  Service: Endoscopy;  Laterality: N/A;   COLONOSCOPY WITH PROPOFOL N/A 01/11/2022   Procedure: COLONOSCOPY WITH PROPOFOL;  Surgeon: Ronnette Juniper, MD;  Location: [facility];  Service: Gastroenterology;  Laterality: N/A;   colonscopy  7 yrs ago   other in past   ESOPHAGOGASTRODUODENOSCOPY (EGD) WITH PROPOFOL N/A 12/07/2015   Procedure: ESOPHAGOGASTRODUODENOSCOPY (EGD) WITH PROPOFOL;  Surgeon: Garlan Fair, MD;  Location: [facility];  Service: Endoscopy;  Laterality: N/A;   ESOPHAGOGASTRODUODENOSCOPY ENDOSCOPY  yrs ago   FACIAL COSMETIC [place]  01/11/2022   Procedure: HEMOSTASIS CLIP PLACEMENT;  Surgeon: Ronnette Juniper, MD;  Location: [facility];  Service: Gastroenterology;;   HOT  HEMOSTASIS N/A 01/11/2022   Procedure: HOT HEMOSTASIS (ARGON PLASMA COAGULATION/BICAP);  Surgeon: Ronnette Juniper, MD;  Location: [facility];  Service: Gastroenterology;  Laterality: N/A;   MYOMECTOMY     POLYPECTOMY  01/11/2022   Procedure: POLYPECTOMY;  Surgeon: Ronnette Juniper, MD;  Location: [facility];  Service: Gastroenterology;;   REPLACEMENT TOTAL JOINT WRIST W/ PROSTHETIC IMPLANT Right 08/01/2021   TONSILLECTOMY      Current Outpatient Medications  Medication Sig Dispense Refill   ALPRAZolam (XANAX) 0.25 MG tablet Take 1 tablet (0.25 mg total) by mouth every 8 (eight) hours as needed for anxiety. 30 tablet 0   B Complex Vitamins (B COMPLEX 1 PO) Take 1 each by mouth daily. sublingual     Calcium Carbonate+Vitamin D 600-200 MG-UNIT TABS Take 1 tablet by mouth daily.     denosumab (PROLIA) 60 MG/ML SOLN injection Inject 60 mg into the skin every 6 (six) months. Administer in upper arm, thigh, or abdomen 180 mL 2   escitalopram (LEXAPRO) 10 MG tablet Take 10 mg by mouth daily.     methocarbamol (ROBAXIN) 500 MG tablet Take 250 mg by mouth daily as needed for muscle spasms.     Multiple Vitamins-Minerals (MULTIVITAMIN PO) Take 1 tablet by mouth daily.     Peppermint Oil 0.2 ML CPDR Take 0.2 mLs by mouth daily as needed (IBS).     rOPINIRole (REQUIP) 1 MG tablet Take 1 mg by mouth at bedtime.     rosuvastatin (CRESTOR) 5 MG tablet Take 5 mg by mouth at bedtime.     warfarin (COUMADIN) 1 MG tablet Take 1/2 tablet to 1 tablet by mouth daily or as directed by Anticoagulation Clinic. 90 tablet 1   Current Facility-Administered Medications  Medication Dose Route Frequency Provider Last Rate Last Admin   denosumab (PROLIA) injection 60 mg  60 mg Subcutaneous Once Nunzio Cobbs, MD         ALLERGIES: Sulfa antibiotics  Family History  Problem Relation Age of Onset   Hypertension Mother    Breast cancer Mother        41's   Cancer Father        COLON   Hypertension Sister     Stroke Sister    Breast cancer Maternal Aunt        Age 59's   Cancer Maternal Aunt        Melanoma   Alzheimer's disease Maternal Aunt    Cancer Paternal Aunt        OVARIAN and  COLON   Breast cancer Maternal Aunt        70's   Cancer Maternal Aunt        Colon CA   Alzheimer's disease Maternal Aunt     Social History   Socioeconomic History   Marital status: Single    Spouse name: Not on file   Number of children: Not on file   Years of education: Not on file   Highest education level: Not on file  Occupational History   Not on file  Tobacco Use   Smoking status: Former    Packs/day: 0.10    Years: 6.00    Total pack years: 0.60    Types: Cigarettes    Quit date: 08/01/1978    Years since quitting: 44.0   Smokeless tobacco: Never   Tobacco comments:    only smoked 4-6 yrs 1 pack per month  Vaping Use   Vaping Use: Never used  Substance and Sexual Activity   Alcohol use: Yes    Comment: 1 drink per month   Drug use: No   Sexual activity: Not Currently    Birth control/protection: Surgical    Comment: HYSTERECTOMY-1st intercourse 27-Fewer than 5 partners  Other Topics Concern   Not on file  Social History Narrative   Not on file   Social Determinants of Health   Financial Resource Strain: Not on file  Food Insecurity: Not on file  Transportation Needs: Not on file  Physical Activity: Not on file  Stress: Not on file  Social Connections: Not on file  Intimate Partner Violence: Not on file    Review of Systems  PHYSICAL EXAMINATION:    There were no vitals taken for this visit.    General appearance: alert, cooperative and appears stated age Head: Normocephalic, without obvious abnormality, atraumatic Neck: no adenopathy, supple, symmetrical, trachea midline and thyroid normal to inspection and palpation Lungs: clear to auscultation bilaterally Breasts: normal appearance, no masses or tenderness, No nipple retraction or dimpling, No nipple discharge  or bleeding, No axillary or supraclavicular adenopathy Heart: regular rate and rhythm Abdomen: soft, non-tender, no masses,  no organomegaly Extremities: extremities normal, atraumatic, no cyanosis or edema Skin: Skin color, texture, turgor normal. No rashes or lesions Lymph nodes: Cervical, supraclavicular, and axillary nodes normal. No abnormal inguinal nodes palpated Neurologic: Grossly normal  Pelvic: External genitalia:  no lesions              Urethra:  normal appearing urethra with no masses, tenderness or lesions              Bartholins and Skenes: normal                 Vagina: normal appearing vagina with normal color and discharge, no lesions              Cervix: no lesions                Bimanual Exam:  Uterus:  normal size, contour, position, consistency, mobility, non-tender              Adnexa: no mass, fullness, tenderness              Rectal exam: {yes no:[id number]}.  Confirms.              Anus:  normal sphincter tone, no lesions  Chaperone was present for exam:  ***  ASSESSMENT     PLAN     An After Visit Summary was  printed and given to the patient.  ______ minutes face to face time of which over 50% was spent in counseling.

## 2022-07-28 NOTE — Progress Notes (Addendum)
GYNECOLOGY  VISIT   HPI: 76 y.o.   Single  Caucasian  female   G0P0 with No LMP recorded. Patient has had a hysterectomy.   here for   lower abdominal pain.  Right side pain. Pt feels like everything is "bunched up" with a sharp pain sometimes. Pt has had irritation with urinating In the last 7-10 days.   She has had vaginal bleeding with urination, resumed 3 weeks ago, but none for the last 5 - 6 days. With wiping, she notices a smear of blood on the tissue.   No dysuria.  Denies hx or kidney stones.   Not sexually active for a while.   Taking coumadin every day. INR 3.4 on 07/13/22. No dosage change.  She notices some pain, like a "ping" on her right ovary.  It feels like some thing is bunched up.  Lasts a couple of minutes.  Not associated with anything.  Nothing makes it better or worse.  No known bleeding at the time the vaginal bleeding occurs.   No vulvar pain.   Hx IBS.   Patient followed for osteoporosis, hx of DES exposure and prior cervical dysplasia, family history of breast and ovarian cancer.   She is using Prolia.  Last BMD done at [facility] 05/08/20. Right femur, T score -2.7.  Left femur, T score - 2.5. Spine, T score -0.8.  GYNECOLOGIC HISTORY: No LMP recorded. Patient has had a hysterectomy. Contraception:  hysterectomy Menopausal hormone therapy:  n/a Last mammogram:  01/25/22 Breast Density Category A, BI-RADS CATEGORY 1 Negative Last pap smear:   09/27/21 negative:HR HPV negative, 08/19/20 negative        OB History     Gravida  0   Para  0   Term      Preterm      AB      Living         SAB      IAB      Ectopic      Multiple      Live Births                 Patient Active Problem List   Diagnosis Date Noted   Atrial tachycardia 11/16/2021   Chronic fatigue and malaise 11/16/2021   Encounter for therapeutic drug monitoring 02/02/2021   Acute deep vein thrombosis (DVT) of right lower extremity, unspecified vein ([place])  01/25/2021   Aortic atherosclerosis ([place]) 11/17/2020   Acute DVT (deep venous thrombosis) ([place]) 11/13/2020   Depression with anxiety 11/13/2020   RLS (restless legs syndrome) 01/07/2020   Insomnia due to medical condition 01/07/2020   Nocturia more than twice per night 01/07/2020   Bradycardia 12/14/2017   Abnormal EKG    GERD (gastroesophageal reflux disease)    Dyspnea on exertion 10/30/2017   Family history of ovarian cancer 04/17/2017   Family history of breast cancer 04/17/2017   Family history of colon cancer 04/17/2017   Family history of pancreatic cancer 04/17/2017   Family history of melanoma 04/17/2017   Genetic testing 04/17/2017   CIN III (cervical intraepithelial neoplasia III)    Arthritis    Fibromyalgia    PE (pulmonary embolism)    Thyroid disease    Osteoporosis    DVT (deep venous thrombosis) ([place]) 04/01/2004    Past Medical History:  Diagnosis Date   Abnormal EKG    Anxiety    Arthritis    oa   Bradycardia 12/14/2017   CIN III (cervical  intraepithelial neoplasia III) 2951   Complication of anesthesia    did well last 2 times with procedures   Depression    DES exposure in utero    DVT (deep venous thrombosis) ([place]) 01/2009   LEFT LEG   DVT (deep venous thrombosis) ([place]) 10/30/2020   Dyspnea on exertion 10/2017   Elevated triglycerides with high cholesterol    Endometriosis    Fibromyalgia    GERD (gastroesophageal reflux disease)    History of colon polyps    History of hiatal hernia    told by some md has, some say not   IBS (irritable bowel syndrome) 2015   Left bundle branch block    intermittent   Lupus anticoagulant disorder ([place])    Multinodular goiter    Osteoporosis 12/2017   T score -3.2 stable from prior study   PE (pulmonary embolism)    Pneumonia 03/01/2021   PONV (postoperative nausea and vomiting)    Restless legs syndrome    Sinus bradycardia    Sleep disorder    Thyroid disease    HYPERTHYROIDISM   Vitamin D  deficiency     Past Surgical History:  Procedure Laterality Date   ABDOMINAL HYSTERECTOMY  2001   TAH, partial   APPENDECTOMY  1978   COLONOSCOPY WITH PROPOFOL N/A 12/07/2015   Procedure: COLONOSCOPY WITH PROPOFOL;  Surgeon: Garlan Fair, MD;  Location: [facility];  Service: Endoscopy;  Laterality: N/A;   COLONOSCOPY WITH PROPOFOL N/A 01/11/2022   Procedure: COLONOSCOPY WITH PROPOFOL;  Surgeon: Ronnette Juniper, MD;  [facility]: [facility];  Service: Gastroenterology;  Laterality: N/A;   colonscopy  7 yrs ago   other in past   ESOPHAGOGASTRODUODENOSCOPY (EGD) WITH PROPOFOL N/A 12/07/2015   Procedure: ESOPHAGOGASTRODUODENOSCOPY (EGD) WITH PROPOFOL;  Surgeon: Garlan Fair, MD;  [facility];  Service: Endoscopy;  Laterality: N/A;   ESOPHAGOGASTRODUODENOSCOPY ENDOSCOPY  yrs ago   FACIAL COSMETIC [place]  01/11/2022   Procedure: HEMOSTASIS CLIP PLACEMENT;  Surgeon: Ronnette Juniper, MD;  [facility];  Service: Gastroenterology;;   HOT HEMOSTASIS N/A 01/11/2022   Procedure: HOT HEMOSTASIS (ARGON PLASMA COAGULATION/BICAP);  Surgeon: Ronnette Juniper, MD;  Location: [facility];  Service: Gastroenterology;  Laterality: N/A;   MYOMECTOMY     POLYPECTOMY  01/11/2022   Procedure: POLYPECTOMY;  Surgeon: Ronnette Juniper, MD;  [facility];  Service: Gastroenterology;;   REPLACEMENT TOTAL JOINT WRIST W/ PROSTHETIC IMPLANT Right 08/01/2021   TONSILLECTOMY      Current Outpatient Medications  Medication Sig Dispense Refill   ALPRAZolam (XANAX) 0.25 MG tablet Take 1 tablet (0.25 mg total) by mouth every 8 (eight) hours as needed for anxiety. 30 tablet 0   denosumab (PROLIA) 60 MG/ML SOLN injection Inject 60 mg into the skin every 6 (six) months. Administer in upper arm, thigh, or abdomen 180 mL 2   escitalopram (LEXAPRO) 10 MG tablet Take 10 mg by mouth daily.     methocarbamol (ROBAXIN) 500 MG tablet Take 250 mg by mouth  daily as needed for muscle spasms.     Peppermint Oil 0.2 ML CPDR Take 0.2 mLs by mouth daily as needed (IBS).     rOPINIRole (REQUIP) 1 MG tablet Take 1 mg by mouth at bedtime.     rosuvastatin (CRESTOR) 5 MG tablet Take 5 mg by mouth at bedtime.     warfarin (COUMADIN) 1 MG tablet Take 1/2 tablet to 1 tablet by  mouth daily or as directed by Anticoagulation Clinic. 90 tablet 1   B Complex Vitamins (B COMPLEX 1 PO) Take 1 each by mouth daily. sublingual (Patient not taking: Reported on 08/02/2022)     Calcium Carbonate+Vitamin D 600-200 MG-UNIT TABS Take 1 tablet by mouth daily. (Patient not taking: Reported on 08/02/2022)     Current Facility-Administered Medications  Medication Dose Route Frequency Provider Last Rate Last Admin   denosumab (PROLIA) injection 60 mg  60 mg Subcutaneous Once Nunzio Cobbs, MD         ALLERGIES: Sulfa antibiotics  Family History  Problem Relation Age of Onset   Hypertension Mother    Breast cancer Mother        104's   Cancer Father        COLON   Hypertension Sister    Stroke Sister    Breast cancer Maternal Aunt        Age 79's   Cancer Maternal Aunt        Melanoma   Alzheimer's disease Maternal Aunt    Cancer Paternal Aunt        OVARIAN and COLON   Breast cancer Maternal Aunt        70's   Cancer Maternal Aunt        Colon CA   Alzheimer's disease Maternal Aunt     Social History   Socioeconomic History   Marital status: Single    Spouse name: Not on file   Number of children: Not on file   Years of education: Not on file   Highest education level: Not on file  Occupational History   Not on file  Tobacco Use   Smoking status: Former    Packs/day: 0.10    Years: 6.00    Total pack years: 0.60    Types: Cigarettes    Quit date: 08/01/1978    Years since quitting: 44.0   Smokeless tobacco: Never   Tobacco comments:    only smoked 4-6 yrs 1 pack per month  Vaping Use   Vaping Use: Never used  Substance and Sexual  Activity   Alcohol use: Yes    Comment: 1 drink per month   Drug use: No   Sexual activity: Not Currently    Birth control/protection: Surgical    Comment: HYSTERECTOMY-1st intercourse 27-Fewer than 5 partners  Other Topics Concern   Not on file  Social History Narrative   Not on file   Social Determinants of Health   Financial Resource Strain: Not on file  Food Insecurity: Not on file  Transportation Needs: Not on file  Physical Activity: Not on file  Stress: Not on file  Social Connections: Not on file  Intimate Partner Violence: Not on file    Review of Systems  Genitourinary:  Positive for dysuria and pelvic pain.    PHYSICAL EXAMINATION:    BP 120/86 (BP Location: Left Arm, Patient Position: Sitting, Cuff Size: Normal)   Pulse (!) 59   Ht '5\' 2"'$  (1.575 m)   Wt 128 lb (58.1 kg)   SpO2 98%   BMI 23.41 kg/m     General appearance: alert, cooperative and appears stated age   Pelvic: External genitalia:  no lesions.  Erythema of epithelium above the urethra, beefy red.  Not bleeding.               Urethra:  normal appearing urethra with no masses, tenderness or lesions  Bartholins and Skenes: normal                 Vagina: normal appearing vagina with normal color and discharge, no lesions              Cervix: no lesions                Bimanual Exam:  Uterus:  absent              Adnexa: no mass, fullness, tenderness              Rectal exam:  no lesions.  No masses, nontender.  Chaperone was present for exam:  Raquel Sarna  ASSESSMENT  RLQ pain. FH ovarian and breast cancer.  Negative genetic testing.  Vaginal bleeding with wiping.   Most likely due to coumadin.  Vulvar lesion.  Osteoporosis.   Status post hysterectomy in 2001 for CIN III. Hx CIN III 2000. Hx DES exposure.  Hx DVT and PE.  On coumadin.   PLAN  Causes of vulvar lesion discussed.  Check urinalysis and culture.   Not enough urine for urinalysis and culture.  Will do urine culture  only.  Return for pelvic US and vulvar biopsy.  Skip dose of coumadin on the day of the ultrasound, vulvar biopsy appointment.  Schedule BMD.  She will call [facility].    An After Visit Summary was printed and given to the patient.  30 min total time was spent for this patient encounter, including preparation, face-to-face counseling with the patient, coordination of care, and documentation of the encounter.

## 2022-08-01 DIAGNOSIS — L280 Lichen simplex chronicus: Secondary | ICD-10-CM

## 2022-08-01 HISTORY — DX: Lichen simplex chronicus: L28.0

## 2022-08-02 ENCOUNTER — Encounter: Payer: Self-pay | Admitting: Obstetrics and Gynecology

## 2022-08-02 ENCOUNTER — Ambulatory Visit (INDEPENDENT_AMBULATORY_CARE_PROVIDER_SITE_OTHER): Payer: PPO | Admitting: Obstetrics and Gynecology

## 2022-08-02 VITALS — BP 120/86 | HR 59 | Ht 62.0 in | Wt 128.0 lb

## 2022-08-02 DIAGNOSIS — N9089 Other specified noninflammatory disorders of vulva and perineum: Secondary | ICD-10-CM

## 2022-08-02 DIAGNOSIS — M810 Age-related osteoporosis without current pathological fracture: Secondary | ICD-10-CM

## 2022-08-02 DIAGNOSIS — Z8041 Family history of malignant neoplasm of ovary: Secondary | ICD-10-CM

## 2022-08-02 DIAGNOSIS — R1031 Right lower quadrant pain: Secondary | ICD-10-CM

## 2022-08-02 NOTE — Addendum Note (Signed)
Addended by: Yisroel Ramming, Dietrich Pates E on: 08/02/2022 12:42 PM   Modules accepted: Orders

## 2022-08-02 NOTE — Addendum Note (Signed)
Addended by: Carleene Mains on: 08/02/2022 12:46 PM   Modules accepted: Orders

## 2022-08-03 LAB — URINE CULTURE
MICRO NUMBER:: 14376848
Result:: NO GROWTH
SPECIMEN QUALITY:: ADEQUATE

## 2022-08-09 ENCOUNTER — Ambulatory Visit: Payer: PPO | Admitting: Obstetrics and Gynecology

## 2022-08-12 ENCOUNTER — Ambulatory Visit: Payer: PPO | Attending: Cardiology

## 2022-08-12 DIAGNOSIS — Z5181 Encounter for therapeutic drug level monitoring: Secondary | ICD-10-CM | POA: Diagnosis not present

## 2022-08-12 DIAGNOSIS — I82401 Acute embolism and thrombosis of unspecified deep veins of right lower extremity: Secondary | ICD-10-CM

## 2022-08-12 DIAGNOSIS — Z86718 Personal history of other venous thrombosis and embolism: Secondary | ICD-10-CM | POA: Diagnosis not present

## 2022-08-12 LAB — POCT INR: INR: 3.2 — AB (ref 2.0–3.0)

## 2022-08-12 NOTE — Patient Instructions (Signed)
Continue taking Warfarin 1 tablet daily except for 1/2 tablet on Sundays, Tuesdays, and Thursdays.  Recheck INR 6 weeks.   Coumadin Clinic [phone]

## 2022-08-16 ENCOUNTER — Telehealth: Payer: Self-pay | Admitting: Neurology

## 2022-08-16 NOTE — Progress Notes (Signed)
GYNECOLOGY  VISIT   HPI: 77 y.o.   Single  Caucasian  female   G0P0 with No LMP recorded. Patient has had a hysterectomy.   here for   U/S, vulvar biopsy.   Not having right sided pain.   Has periodic bleeding with wiping.   Did not take coumadin today.   GYNECOLOGIC HISTORY: No LMP recorded. Patient has had a hysterectomy. Contraception:  hysterectomy Menopausal hormone therapy:  n/a Last mammogram:  01/25/22 Breast Density Category A, BI-RADS CATEGORY 1 Neg Last pap smear:   09/27/21 neg: HR HPV neg, 08/19/20 neg        OB History     Gravida  0   Para  0   Term      Preterm      AB      Living         SAB      IAB      Ectopic      Multiple      Live Births                 Patient Active Problem List   Diagnosis Date Noted   Atrial tachycardia 11/16/2021   Chronic fatigue and malaise 11/16/2021   Encounter for therapeutic drug monitoring 02/02/2021   Acute deep vein thrombosis (DVT) of right lower extremity, unspecified vein ([place]) 01/25/2021   Aortic atherosclerosis ([place]) 11/17/2020   Acute DVT (deep venous thrombosis) ([place]) 11/13/2020   Depression with anxiety 11/13/2020   RLS (restless legs syndrome) 01/07/2020   Insomnia due to medical condition 01/07/2020   Nocturia more than twice per night 01/07/2020   Bradycardia 12/14/2017   Abnormal EKG    GERD (gastroesophageal reflux disease)    Dyspnea on exertion 10/30/2017   Family history of ovarian cancer 04/17/2017   Family history of breast cancer 04/17/2017   Family history of colon cancer 04/17/2017   Family history of pancreatic cancer 04/17/2017   Family history of melanoma 04/17/2017   Genetic testing 04/17/2017   CIN III (cervical intraepithelial neoplasia III)    Arthritis    Fibromyalgia    PE (pulmonary embolism)    Thyroid disease    Osteoporosis    DVT (deep venous thrombosis) ([place]) 04/01/2004    Past Medical History:  Diagnosis Date   Abnormal EKG    Anxiety     Arthritis    oa   Bradycardia 12/14/2017   CIN III (cervical intraepithelial neoplasia III) 5329   Complication of anesthesia    did well last 2 times with procedures   Depression    DES exposure in utero    DVT (deep venous thrombosis) ([place]) 01/2009   LEFT LEG   DVT (deep venous thrombosis) ([place]) 10/30/2020   Dyspnea on exertion 10/2017   Elevated triglycerides with high cholesterol    Endometriosis    Fibromyalgia    GERD (gastroesophageal reflux disease)    History of colon polyps    History of hiatal hernia    told by some md has, some say not   IBS (irritable bowel syndrome) 2015   Left bundle branch block    intermittent   Lupus anticoagulant disorder ([place])    Multinodular goiter    Osteoporosis 12/2017   T score -3.2 stable from prior study   PE (pulmonary embolism)    Pneumonia 03/01/2021   PONV (postoperative nausea and vomiting)    Restless legs syndrome    Sinus bradycardia    Sleep  disorder    Thyroid disease    HYPERTHYROIDISM   Vitamin D deficiency     Past Surgical History:  Procedure Laterality Date   ABDOMINAL HYSTERECTOMY  2001   TAH, partial   APPENDECTOMY  1978   COLONOSCOPY WITH PROPOFOL N/A 12/07/2015   Procedure: COLONOSCOPY WITH PROPOFOL;  Surgeon: Garlan Fair, MD;  [facility];  Service: Endoscopy;  Laterality: N/A;   COLONOSCOPY WITH PROPOFOL N/A 01/11/2022   Procedure: COLONOSCOPY WITH PROPOFOL;  Surgeon: Ronnette Juniper, MD;  [facility];  Service: Gastroenterology;  Laterality: N/A;   colonscopy  7 yrs ago   other in past   ESOPHAGOGASTRODUODENOSCOPY (EGD) WITH PROPOFOL N/A 12/07/2015   Procedure: ESOPHAGOGASTRODUODENOSCOPY (EGD) WITH PROPOFOL;  Surgeon: Garlan Fair, MD;  [facility];  Service: Endoscopy;  Laterality: N/A;   ESOPHAGOGASTRODUODENOSCOPY ENDOSCOPY  yrs ago   FACIAL COSMETIC [place]  01/11/2022   Procedure: HEMOSTASIS CLIP  PLACEMENT;  Surgeon: Ronnette Juniper, MD;  [facility];  Service: Gastroenterology;;   HOT HEMOSTASIS N/A 01/11/2022   Procedure: HOT HEMOSTASIS (ARGON PLASMA COAGULATION/BICAP);  Surgeon: Ronnette Juniper, MD;  Location: [facility];  Service: Gastroenterology;  Laterality: N/A;   MYOMECTOMY     POLYPECTOMY  01/11/2022   Procedure: POLYPECTOMY;  Surgeon: Ronnette Juniper, MD;  [facility];  Service: Gastroenterology;;   REPLACEMENT TOTAL JOINT WRIST W/ PROSTHETIC IMPLANT Right 08/01/2021   TONSILLECTOMY      Current Outpatient Medications  Medication Sig Dispense Refill   ALPRAZolam (XANAX) 0.25 MG tablet Take 1 tablet (0.25 mg total) by mouth every 8 (eight) hours as needed for anxiety. 30 tablet 0   denosumab (PROLIA) 60 MG/ML SOLN injection Inject 60 mg into the skin every 6 (six) months. Administer in upper arm, thigh, or abdomen 180 mL 2   escitalopram (LEXAPRO) 10 MG tablet Take 10 mg by mouth daily.     methocarbamol (ROBAXIN) 500 MG tablet Take 250 mg by mouth daily as needed for muscle spasms.     Peppermint Oil 0.2 ML CPDR Take 0.2 mLs by mouth daily as needed (IBS).     rOPINIRole (REQUIP) 1 MG tablet Take 1 mg by mouth at bedtime.     rosuvastatin (CRESTOR) 5 MG tablet Take 5 mg by mouth at bedtime.     warfarin (COUMADIN) 1 MG tablet Take 1/2 tablet to 1 tablet by mouth daily or as directed by Anticoagulation Clinic. 90 tablet 1   B Complex Vitamins (B COMPLEX 1 PO) Take 1 each by mouth daily. sublingual (Patient not taking: Reported on 08/02/2022)     Calcium Carbonate+Vitamin D 600-200 MG-UNIT TABS Take 1 tablet by mouth daily. (Patient not taking: Reported on 08/02/2022)     Current Facility-Administered Medications  Medication Dose Route Frequency Provider Last Rate Last Admin   denosumab (PROLIA) injection 60 mg  60 mg Subcutaneous Once Nunzio Cobbs, MD         ALLERGIES: Sulfa antibiotics  Family History  Problem Relation Age of Onset   Hypertension  Mother    Breast cancer Mother        51's   Cancer Father        COLON   Hypertension Sister    Stroke Sister    Breast cancer Maternal Aunt        Age 31's   Cancer Maternal Aunt  Melanoma   Alzheimer's disease Maternal Aunt    Cancer Paternal Aunt        OVARIAN and COLON   Breast cancer Maternal Aunt        70's   Cancer Maternal Aunt        Colon CA   Alzheimer's disease Maternal Aunt     Social History   Socioeconomic History   Marital status: Single    Spouse name: Not on file   Number of children: Not on file   Years of education: Not on file   Highest education level: Not on file  Occupational History   Not on file  Tobacco Use   Smoking status: Former    Packs/day: 0.10    Years: 6.00    Total pack years: 0.60    Types: Cigarettes    Quit date: 08/01/1978    Years since quitting: 44.0   Smokeless tobacco: Never   Tobacco comments:    only smoked 4-6 yrs 1 pack per month  Vaping Use   Vaping Use: Never used  Substance and Sexual Activity   Alcohol use: Yes    Comment: 1 drink per month   Drug use: No   Sexual activity: Not Currently    Birth control/protection: Surgical    Comment: HYSTERECTOMY-1st intercourse 27-Fewer than 5 partners  Other Topics Concern   Not on file  Social History Narrative   Not on file   Social Determinants of Health   Financial Resource Strain: Not on file  Food Insecurity: Not on file  Transportation Needs: Not on file  Physical Activity: Not on file  Stress: Not on file  Social Connections: Not on file  Intimate Partner Violence: Not on file    Review of Systems  All other systems reviewed and are negative.   PHYSICAL EXAMINATION:    BP 118/64 (BP Location: Right Arm, Patient Position: Sitting, Cuff Size: Normal)   Pulse (!) 47   Ht '5\' 2"'$  (1.575 m)   Wt 128 lb (58.1 kg)   SpO2 97%   BMI 23.41 kg/m     General appearance: alert, cooperative and appears stated age   Pelvic US - transvaginal and  transabdominal Uterus absent.  Left ovary - 1.86 x 1.24 x 0.94 cm.  Right - 1.69 x 1.12 x 1.08 cm.  No adnexal masses.  No free fluid.   Pelvic: External genitalia:  infraclitoral beefy red flat skin rash/lesion.  Located where the labia minora meet.   Vulvar biopsy Consent done.  Sterile prep - Hibiclens.  Local 1% lidocaine, lot [id number], exp 09/02/23. 3 mm punch biopsy.  Tissue to pathology.  Single suture of 3/0 Vicryl.  Minimal EBL.  No complications.     Chaperone was present for exam:  Raquel Sarna  ASSESSMENT  RLQ pain, resolved.  FH ovarian cancer.  Status post hysterectomy.  Vulvar lesion.   PLAN  Pelvic US imagines and report reviewed.  Reassurance given regarding US findings.  I recommend she see her PCP if her pain recurs.  Fu biopsy results with final plan to follow.  Resume coumadin tomorrow in usual dosage.  Fu prn.    An After Visit Summary was printed and given to the patient.  20  total time was spent for this patient encounter, including preparation, face-to-face counseling with the patient, coordination of care, and documentation of the encounter in addition to doing the vulvar biopsy.

## 2022-08-16 NOTE — Telephone Encounter (Signed)
LVM and sent mychart msg informing pt of r/s needed for 2/7 appt- MD out.

## 2022-08-24 DIAGNOSIS — Z780 Asymptomatic menopausal state: Secondary | ICD-10-CM | POA: Diagnosis not present

## 2022-08-24 DIAGNOSIS — M810 Age-related osteoporosis without current pathological fracture: Secondary | ICD-10-CM | POA: Diagnosis not present

## 2022-08-25 ENCOUNTER — Ambulatory Visit (INDEPENDENT_AMBULATORY_CARE_PROVIDER_SITE_OTHER): Payer: PPO

## 2022-08-25 ENCOUNTER — Encounter: Payer: Self-pay | Admitting: Obstetrics and Gynecology

## 2022-08-25 ENCOUNTER — Ambulatory Visit (INDEPENDENT_AMBULATORY_CARE_PROVIDER_SITE_OTHER): Payer: PPO | Admitting: Obstetrics and Gynecology

## 2022-08-25 ENCOUNTER — Other Ambulatory Visit (HOSPITAL_COMMUNITY)
Admission: RE | Admit: 2022-08-25 | Discharge: 2022-08-25 | Disposition: A | Discharge status: Home / Self Care | Payer: PPO | Source: Ambulatory Visit | Attending: Obstetrics and Gynecology | Admitting: Obstetrics and Gynecology

## 2022-08-25 VITALS — BP 118/64 | HR 47 | Ht 62.0 in | Wt 128.0 lb

## 2022-08-25 DIAGNOSIS — Z8041 Family history of malignant neoplasm of ovary: Secondary | ICD-10-CM

## 2022-08-25 DIAGNOSIS — R1031 Right lower quadrant pain: Secondary | ICD-10-CM | POA: Diagnosis not present

## 2022-08-25 DIAGNOSIS — N762 Acute vulvitis: Secondary | ICD-10-CM | POA: Diagnosis not present

## 2022-08-25 DIAGNOSIS — N9089 Other specified noninflammatory disorders of vulva and perineum: Secondary | ICD-10-CM | POA: Insufficient documentation

## 2022-08-25 NOTE — Patient Instructions (Signed)
Vulva Biopsy, Care After The following information offers guidance on how to care for yourself after your procedure. Your health care provider may also give you more specific instructions. If you have problems or questions, contact your health care provider. What can I expect after the procedure? After the procedure, it is common to have: Slight bleeding from the biopsy site. Slight pain or discomfort at the biopsy site. Follow these instructions at home: Biopsy site care  Follow instructions from your health care provider about how to take care of your biopsy site. Make sure you: Clean the area using water and mild soap twice a day or as told by your health care provider. Gently pat the area dry. You may shower 24 hours after the procedure. If you were prescribed an antibiotic ointment, apply it as told by your health care provider. Do not stop using the antibiotic even if your condition improves. If told by your health care provider, take a sitz bath to help with pain and discomfort. This is a warm water bath that you take while sitting down. Do this as often as told by your health care provider. The water should only come up to your hips and cover your buttocks. You may pat the area dry with a soft, clean towel. Leave stitches (sutures), skin glue, or adhesive strips in place. These skin closures may need to stay in place for 2 weeks or longer. If adhesive strip edges start to loosen and curl up, you may trim the loose edges. Do not remove adhesive strips completely unless your health care provider tells you to do that. Check your biopsy site every day for signs of infection. It may be helpful to use a handheld mirror to do this. Check for: Redness, swelling, or more pain. More fluid or blood. Warmth. Pus or a bad smell. Do not rub the biopsy area after urinating. Gently pat the area dry or use a bottle filled with warm water (peri bottle) to clean the area. Gently wipe from front to  back. Lifestyle Wear loose, cotton underwear. Do not wear tight pants. For at least 1 week or until your health care provider approves: Do not use tampons, douche, or put anything inside your vagina. Do not have sex. Until your health care provider approves: Do not exercise, such as running or biking. Do not swim or use a hot tub. General instructions Take over-the-counter and prescription medicines only as told by your health care provider. Drink enough fluid to keep your urine pale yellow. Use a sanitary napkin until the bleeding stops. If told, put ice on the biopsy site. To do this: Place ice in a plastic bag. Place a towel between your skin and the bag. Leave the ice on for 20 minutes, 2-3 times a day. Remove the ice if your skin turns bright red. This is very important. If you cannot feel pain, heat, or cold, you have a greater risk of damage to the area. Keep all follow-up visits. This is important. Contact a health care provider if: You have redness, swelling, or more pain around your biopsy site. You have more fluid or blood coming from your biopsy site. Your biopsy site feels warm to the touch. Your pain is not controlled with medicine or ice packs. You have a fever or chills. Get help right away if: You have heavy bleeding from the vulva. You have pus or a bad smell coming from the biopsy site. You have abdominal pain. Summary After the procedure, it  is common to have slight bleeding and discomfort at the biopsy site. Follow instructions from your health care provider after your biopsy. Take sitz baths as told by your health care provider to help with pain and discomfort. Leave any sutures in place. Contact your health care provider if you notice any signs of infection around the biopsy site, including redness, swelling, more pain, more fluid or blood, or warmth. Keep all follow-up visits. This is important. This information is not intended to replace advice given to you  by your health care provider. Make sure you discuss any questions you have with your health care provider. Document Revised: 04/06/2021 Document Reviewed: 04/06/2021 Elsevier Patient Education  [place].

## 2022-08-29 ENCOUNTER — Encounter: Payer: Self-pay | Admitting: Obstetrics and Gynecology

## 2022-08-29 LAB — SURGICAL PATHOLOGY

## 2022-09-02 ENCOUNTER — Telehealth: Payer: Self-pay

## 2022-09-02 DIAGNOSIS — M810 Age-related osteoporosis without current pathological fracture: Secondary | ICD-10-CM

## 2022-09-02 NOTE — Telephone Encounter (Signed)
Ok to return to [facility].

## 2022-09-02 NOTE — Telephone Encounter (Signed)
FYI. While notifying pt of results/recommendations from most recent DEXA results. She reports that she has been seen by [facility] ([place], [place]) in past for thyroid and would like to return there since they have prior records. OK to send referral for her osteoporosis there? Please advise.

## 2022-09-04 ENCOUNTER — Encounter: Payer: Self-pay | Admitting: Obstetrics and Gynecology

## 2022-09-05 ENCOUNTER — Other Ambulatory Visit: Payer: Self-pay

## 2022-09-05 DIAGNOSIS — L439 Lichen planus, unspecified: Secondary | ICD-10-CM

## 2022-09-05 DIAGNOSIS — L280 Lichen simplex chronicus: Secondary | ICD-10-CM

## 2022-09-05 MED ORDER — CLOBETASOL PROPIONATE 0.05 % EX OINT: 1.0000 | TOPICAL_OINTMENT | Freq: Every day | CUTANEOUS | 0 refills | Status: AC

## 2022-09-06 NOTE — Telephone Encounter (Signed)
09/05/2022: Referral successfully faxed to [facility] @ [phone].

## 2022-09-06 NOTE — Telephone Encounter (Signed)
Nunzio Cobbs, MD 09/01/2022  8:59 PM EST   Please contact patient regarding her bone density study. She has osteoporosis of both hips and osteopenia of the spine. I reviewed her bone density reports over the last several years, and it appears that there has not been substantial gain in bone density of her hips.   I recommend she have endocrinology consultation with [facility] for review of care and discussion of alternative treatments.

## 2022-09-06 NOTE — Telephone Encounter (Signed)
Pt will use an alterative treatment for osteoporosis

## 2022-09-07 ENCOUNTER — Ambulatory Visit: Payer: PPO | Admitting: Neurology

## 2022-09-13 NOTE — Telephone Encounter (Signed)
VM received from pt stating she received a text from an [facility] location in [place] area yesterday and feels as if we faxed referral to incorrect location.   I called [facility] office in [place] and they confirmed that they received referral and have attempted to contact pt to schedule. Reports that she declined appt once but they have tried to reach back out to her.   I inquired if they contact pt's from a non-local number to schedule and rep indicated that was likely the case.   I attempted to contacted pt back to hopefully have solution for her. However, she felt as if call would take time and she currently had an appt to get to. She reported that she will call us back.

## 2022-09-14 NOTE — Progress Notes (Unsigned)
77 y.o. G0P0 Single Caucasian female here for annual exam.    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  She received a prescription for clobetasol ointment.  She is using the ointment nightly for the last month.   She brings in a copy of a vulvar biopsy from 1990 showing a papilloma.   She is followed for osteoporosis and is using Prolia.  Last injection was 07/27/22.  She has osteoporosis of both hips with T scores of -3.0. I recommended she see an endocrinologist.  She is working to schedule with [facility] and having difficulty doing this.  She takes calcium/vit D daily.   She had a pelvic ultrasound for RLQ pain on 08/25/22, which was normal.  Fh ovarian cancer.  CA125 on 09/27/21.  Dealing with IBS.  Occasional fecal urgency.  PCP:   Dr. Jacalyn Lefevre  No LMP recorded. Patient has had a hysterectomy.           Sexually active: No.  The current method of family planning is status post hysterectomy for CIN III.    Exercising: No. Smoker:  former  Health Maintenance: Pap:  09/27/21 benign glandular cells of the vagina: HR HPV neg: biopsy showed benign fibroepithelial polyp, 08/19/20 neg History of abnormal Pap:  yes, Hx of cryotherapy and CIN III  MMG:  01/25/22 Breast Density Category A, BI-RADS CATEGORY 1 Neg Colonoscopy:  01/11/22 BMD:   08/24/22  Result  osteoporosis, T score -3.0. TDaP:  03/30/10 Gardasil:   no Screening Labs:  PCP   reports that she quit smoking about 44 years ago. Her smoking use included cigarettes. She has a 0.60 pack-year smoking history. She has never used smokeless tobacco. She reports current alcohol use. She reports that she does not use drugs.  Past Medical History:  Diagnosis Date   Abnormal EKG    Anxiety    Arthritis    oa   Bradycardia 12/14/2017   CIN III (cervical intraepithelial neoplasia III) [id number]   Complication of anesthesia    did well last 2 times with procedures   Depression    DES exposure in utero    DVT  (deep venous thrombosis) ([place]) 01/2009   LEFT LEG   DVT (deep venous thrombosis) ([place]) 10/30/2020   Dyspnea on exertion 10/2017   Elevated triglycerides with high cholesterol    Endometriosis    Fibromyalgia    GERD (gastroesophageal reflux disease)    History of colon polyps    History of hiatal hernia    told by some md has, some say not   IBS (irritable bowel syndrome) 2015   Left bundle branch block    intermittent   Lichenoid dermatitis [id number]   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   Lupus anticoagulant disorder ([place])    Multinodular goiter    Osteoporosis 12/2017   T score -3.2 stable from prior study   PE (pulmonary embolism)    Pneumonia 03/01/2021   PONV (postoperative nausea and vomiting)    Restless legs syndrome    Sinus bradycardia    Sleep disorder    Thyroid disease    HYPERTHYROIDISM   Vitamin D deficiency     Past Surgical History:  Procedure Laterality Date   ABDOMINAL HYSTERECTOMY  2001   TAH, partial   APPENDECTOMY  1978   COLONOSCOPY WITH PROPOFOL N/A 12/07/2015   Procedure: COLONOSCOPY WITH PROPOFOL;  Surgeon: Garlan Fair, MD;  Location: [facility];  Service: Endoscopy;  Laterality: N/A;   COLONOSCOPY  WITH PROPOFOL N/A 01/11/2022   Procedure: COLONOSCOPY WITH PROPOFOL;  Surgeon: Ronnette Juniper, MD;  Location: [facility];  Service: Gastroenterology;  Laterality: N/A;   colonscopy  7 yrs ago   other in past   ESOPHAGOGASTRODUODENOSCOPY (EGD) WITH PROPOFOL N/A 12/07/2015   Procedure: ESOPHAGOGASTRODUODENOSCOPY (EGD) WITH PROPOFOL;  Surgeon: Garlan Fair, MD;  Location: [facility];  Service: Endoscopy;  Laterality: N/A;   ESOPHAGOGASTRODUODENOSCOPY ENDOSCOPY  yrs ago   FACIAL COSMETIC [place]  01/11/2022   Procedure: HEMOSTASIS CLIP PLACEMENT;  Surgeon: Ronnette Juniper, MD;  Location: [facility];  Service: Gastroenterology;;   HOT HEMOSTASIS N/A 01/11/2022   Procedure: HOT HEMOSTASIS  (ARGON PLASMA COAGULATION/BICAP);  Surgeon: Ronnette Juniper, MD;  Location: [facility];  Service: Gastroenterology;  Laterality: N/A;   MYOMECTOMY     POLYPECTOMY  01/11/2022   Procedure: POLYPECTOMY;  Surgeon: Ronnette Juniper, MD;  Location: [facility];  Service: Gastroenterology;;   REPLACEMENT TOTAL JOINT WRIST W/ PROSTHETIC IMPLANT Right 08/01/2021   TONSILLECTOMY      Current Outpatient Medications  Medication Sig Dispense Refill   ALPRAZolam (XANAX) 0.25 MG tablet Take 1 tablet (0.25 mg total) by mouth every 8 (eight) hours as needed for anxiety. 30 tablet 0   clobetasol ointment (TEMOVATE) [id number] % Apply 1 Application topically at bedtime. 30 g 0   denosumab (PROLIA) 60 MG/ML SOLN injection Inject 60 mg into the skin every 6 (six) months. Administer in upper arm, thigh, or abdomen 180 mL 2   escitalopram (LEXAPRO) 10 MG tablet Take 10 mg by mouth daily.     methocarbamol (ROBAXIN) 500 MG tablet Take 250 mg by mouth daily as needed for muscle spasms.     rOPINIRole (REQUIP) 1 MG tablet Take 1 mg by mouth at bedtime.     rosuvastatin (CRESTOR) 5 MG tablet Take 5 mg by mouth at bedtime.     warfarin (COUMADIN) 1 MG tablet Take 1/2 tablet to 1 tablet by mouth daily or as directed by Anticoagulation Clinic. 90 tablet 1   Current Facility-Administered Medications  Medication Dose Route Frequency Provider Last Rate Last Admin   denosumab (PROLIA) injection 60 mg  60 mg Subcutaneous Once Nunzio Cobbs, MD        Family History  Problem Relation Age of Onset   Hypertension Mother    Breast cancer Mother        45's   Cancer Father        COLON   Hypertension Sister    Stroke Sister    Breast cancer Maternal Aunt        Age 82's   Cancer Maternal Aunt        Melanoma   Alzheimer's disease Maternal Aunt    Cancer Paternal Aunt        OVARIAN and COLON   Breast cancer Maternal Aunt        70's   Cancer Maternal Aunt        Colon CA   Alzheimer's disease Maternal Aunt      Review of Systems  All other systems reviewed and are negative.   Exam:   BP 116/76 (BP Location: Left Arm, Patient Position: Sitting, Cuff Size: Normal)   Ht '5\' 2"'$  (1.575 m)   Wt 128 lb (58.1 kg)   BMI 23.41 kg/m     General appearance: alert, cooperative and appears stated age Head: normocephalic, without obvious abnormality, atraumatic  Neck: no adenopathy, supple, symmetrical, trachea midline and thyroid normal to inspection and palpation Lungs: clear to auscultation bilaterally Breasts: normal appearance, no masses or tenderness, No nipple retraction or dimpling, No nipple discharge or bleeding, No axillary adenopathy Heart: regular rate and rhythm Abdomen: soft, non-tender; no masses, no organomegaly Extremities: extremities normal, atraumatic, no cyanosis or edema Skin: skin color, texture, turgor normal. No rashes or lesions Lymph nodes: cervical, supraclavicular, and axillary nodes normal. Neurologic: grossly normal  Pelvic: External genitalia:  no lesions              No abnormal inguinal nodes palpated.              Urethra:  normal appearing urethra with no masses, tenderness or lesions              Bartholins and Skenes: normal                 Vagina: normal appearing vagina with normal color and discharge, no lesions              Cervix: no lesions              Pap taken: {yes no:314532} Bimanual Exam:  Uterus:  normal size, contour, position, consistency, mobility, non-tender              Adnexa: no mass, fullness, tenderness              Rectal exam: {yes no:314532}.  Confirms.              Anus:  normal sphincter tone, no lesions  Chaperone was present for exam:  ***  Assessment:   Well woman visit with gynecologic exam. GYN exam for high risk Medicare patient.  DES exposure.  Hx cervical dysplasia.  CIN III 2000. Status post hysterectomy.  FH ovarian cancer.   Status post hysterectomy in 2001 for CIN III. Hx CIN III 2000. Osteoporosis.  Hx DES  exposure.  Hx DVT and PE. FH breast and ovarian cancer.  Negative genetic testing.  Doing yearly ultrasounds.  Depression.  Use of Lexapro.   Plan: Mammogram screening discussed. Self breast awareness reviewed. Pap and HR HPV collected. Guidelines for Calcium, Vitamin D, regular exercise program including cardiovascular and weight bearing exercise. CA125. Referral to endocrinology.   Continue with Clobetsole ointment.  She does not need refill yet.  Follow up annually and prn.   After visit summary provided.

## 2022-09-22 NOTE — Telephone Encounter (Signed)
Pt reports that she spoke w/ office today and they were having trouble getting their schedules pulled up and will call her back to get her scheduled.  Pt reported that if she didn't hear back from them today that she will call them back tomorrow.

## 2022-09-23 ENCOUNTER — Ambulatory Visit: Payer: PPO | Attending: Cardiovascular Disease | Admitting: *Deleted

## 2022-09-23 DIAGNOSIS — Z5181 Encounter for therapeutic drug level monitoring: Secondary | ICD-10-CM

## 2022-09-23 LAB — POCT INR: POC INR: 2.5

## 2022-09-23 NOTE — Patient Instructions (Signed)
Description   Continue taking Warfarin 1 tablet daily except for 1/2 tablet on Sundays, Tuesdays, and Thursdays.  Recheck INR 6 weeks.   Coumadin Clinic [phone]

## 2022-09-26 DIAGNOSIS — H40053 Ocular hypertension, bilateral: Secondary | ICD-10-CM | POA: Diagnosis not present

## 2022-09-26 DIAGNOSIS — H2513 Age-related nuclear cataract, bilateral: Secondary | ICD-10-CM | POA: Diagnosis not present

## 2022-09-28 ENCOUNTER — Ambulatory Visit (INDEPENDENT_AMBULATORY_CARE_PROVIDER_SITE_OTHER): Payer: PPO | Admitting: Obstetrics and Gynecology

## 2022-09-28 ENCOUNTER — Telehealth: Payer: Self-pay | Admitting: Obstetrics and Gynecology

## 2022-09-28 ENCOUNTER — Encounter: Payer: Self-pay | Admitting: Obstetrics and Gynecology

## 2022-09-28 ENCOUNTER — Other Ambulatory Visit (HOSPITAL_COMMUNITY)
Admission: RE | Admit: 2022-09-28 | Discharge: 2022-09-28 | Disposition: A | Discharge status: Home / Self Care | Payer: PPO | Source: Ambulatory Visit | Attending: Obstetrics and Gynecology | Admitting: Obstetrics and Gynecology

## 2022-09-28 VITALS — BP 116/76 | Ht 62.0 in | Wt 128.0 lb

## 2022-09-28 DIAGNOSIS — Z01419 Encounter for gynecological examination (general) (routine) without abnormal findings: Secondary | ICD-10-CM

## 2022-09-28 DIAGNOSIS — Z8041 Family history of malignant neoplasm of ovary: Secondary | ICD-10-CM

## 2022-09-28 DIAGNOSIS — M810 Age-related osteoporosis without current pathological fracture: Secondary | ICD-10-CM | POA: Diagnosis not present

## 2022-09-28 DIAGNOSIS — Z1151 Encounter for screening for human papillomavirus (HPV): Secondary | ICD-10-CM | POA: Diagnosis not present

## 2022-09-28 DIAGNOSIS — Z5181 Encounter for therapeutic drug level monitoring: Secondary | ICD-10-CM | POA: Diagnosis not present

## 2022-09-28 DIAGNOSIS — Z9189 Other specified personal risk factors, not elsewhere classified: Secondary | ICD-10-CM | POA: Diagnosis not present

## 2022-09-28 DIAGNOSIS — Z803 Family history of malignant neoplasm of breast: Secondary | ICD-10-CM | POA: Diagnosis not present

## 2022-09-28 DIAGNOSIS — D069 Carcinoma in situ of cervix, unspecified: Secondary | ICD-10-CM | POA: Diagnosis not present

## 2022-09-28 DIAGNOSIS — L280 Lichen simplex chronicus: Secondary | ICD-10-CM

## 2022-09-28 NOTE — Patient Instructions (Signed)
EXERCISE AND DIET:  We recommended that you start or continue a regular exercise program for good health. Regular exercise means any activity that makes your heart beat faster and makes you sweat.  We recommend exercising at least 30 minutes per day at least 3 days a week, preferably 4 or 5.  We also recommend a diet low in fat and sugar.  Inactivity, poor dietary choices and obesity can cause diabetes, heart attack, stroke, and kidney damage, among others.    ALCOHOL AND SMOKING:  Women should limit their alcohol intake to no more than 7 drinks/beers/glasses of wine (combined, not each!) per week. Moderation of alcohol intake to this level decreases your risk of breast cancer and liver damage. And of course, no recreational drugs are part of a healthy lifestyle.  And absolutely no smoking or even second hand smoke. Most people know smoking can cause heart and lung diseases, but did you know it also contributes to weakening of your bones? Aging of your skin?  Yellowing of your teeth and nails?  CALCIUM AND VITAMIN D:  Adequate intake of calcium and Vitamin D are recommended.  The recommendations for exact amounts of these supplements seem to change often, but generally speaking 600 mg of calcium (either carbonate or citrate) and 800 units of Vitamin D per day seems prudent. Certain women may benefit from higher intake of Vitamin D.  If you are among these women, your doctor will have told you during your visit.    PAP SMEARS:  Pap smears, to check for cervical cancer or precancers,  have traditionally been done yearly, although recent scientific advances have shown that most women can have pap smears less often.  However, every woman still should have a physical exam from her gynecologist every year. It will include a breast check, inspection of the vulva and vagina to check for abnormal growths or skin changes, a visual exam of the cervix, and then an exam to evaluate the size and shape of the uterus and  ovaries.  And after 75 years of age, a rectal exam is indicated to check for rectal cancers. We will also provide age appropriate advice regarding health maintenance, like when you should have certain vaccines, screening for sexually transmitted diseases, bone density testing, colonoscopy, mammograms, etc.   MAMMOGRAMS:  All women over 40 years old should have a yearly mammogram. Many facilities now offer a "3D" mammogram, which may cost around $50 extra out of pocket. If possible,  we recommend you accept the option to have the 3D mammogram performed.  It both reduces the number of women who will be called back for extra views which then turn out to be normal, and it is better than the routine mammogram at detecting truly abnormal areas.    COLONOSCOPY:  Colonoscopy to screen for colon cancer is recommended for all women at age 50.  We know, you hate the idea of the prep.  We agree, BUT, having colon cancer and not knowing it is worse!!  Colon cancer so often starts as a polyp that can be seen and removed at colonscopy, which can quite literally save your life!  And if your first colonoscopy is normal and you have no family history of colon cancer, most women don't have to have it again for 10 years.  Once every ten years, you can do something that may end up saving your life, right?  We will be happy to help you get it scheduled when you are ready.    Be sure to check your insurance coverage so you understand how much it will cost.  It may be covered as a preventative service at no cost, but you should check your particular policy.    Calcium Content in Foods Calcium is the most abundant mineral in the body. Most of the body's calcium supply is stored in bones and teeth. Calcium helps many parts of the body function normally, including: Blood and blood vessels. Nerves. Hormones. Muscles. Bones and teeth. When your calcium stores are low, you may be at risk for low bone mass, bone loss, and broken bones  (fractures). When you get enough calcium, it helps to support strong bones and teeth throughout your life. Calcium is especially important for: Children during growth spurts. Girls during adolescence. Women who are pregnant or breastfeeding. Women after their menstrual cycle stops (postmenopause). Women whose menstrual cycle has stopped due to anorexia nervosa or regular intense exercise. People who cannot eat or digest dairy products. Vegans. Recommended daily amounts of calcium: Women (ages 19 to 50): 1,000 mg per day. Women (ages 51 and older): 1,200 mg per day. Men (ages 19 to 70): 1,000 mg per day. Men (ages 71 and older): 1,200 mg per day. Women (ages 9 to 18): 1,300 mg per day. Men (ages 9 to 18): 1,300 mg per day. General information Eat foods that are high in calcium. Try to get most of your calcium from food. Some people may benefit from taking calcium supplements. Check with your health care provider or diet and nutrition specialist (dietitian) before starting any calcium supplements. Calcium supplements may interact with certain medicines. Too much calcium may cause other health problems, such as constipation and kidney stones. For the body to absorb calcium, it needs vitamin D. Sources of vitamin D include: Skin exposure to direct sunlight. Foods, such as egg yolks, liver, mushrooms, saltwater fish, and fortified milk. Vitamin D supplements. Check with your health care provider or dietitian before starting any vitamin D supplements. What foods are high in calcium?  Foods that are high in calcium contain more than 100 milligrams per serving. Fruits Fortified orange juice or other fruit juice, 300 mg per 8 oz serving. Vegetables Collard greens, 360 mg per 8 oz serving. Kale, 100 mg per 8 oz serving. Bok choy, 160 mg per 8 oz serving. Grains Fortified ready-to-eat cereals, 100 to 1,000 mg per 8 oz serving. Fortified frozen waffles, 200 mg in 2 waffles. Oatmeal, 140 mg in  1 cup. Meats and other proteins Sardines, canned with bones, 325 mg per 3 oz serving. Salmon, canned with bones, 180 mg per 3 oz serving. Canned shrimp, 125 mg per 3 oz serving. Baked beans, 160 mg per 4 oz serving. Tofu, firm, made with calcium sulfate, 253 mg per 4 oz serving. Dairy Yogurt, plain, low-fat, 310 mg per 6 oz serving. Nonfat milk, 300 mg per 8 oz serving. American cheese, 195 mg per 1 oz serving. Cheddar cheese, 205 mg per 1 oz serving. Cottage cheese 2%, 105 mg per 4 oz serving. Fortified soy, rice, or almond milk, 300 mg per 8 oz serving. Mozzarella, part skim, 210 mg per 1 oz serving. The items listed above may not be a complete list of foods high in calcium. Actual amounts of calcium may be different depending on processing. Contact a dietitian for more information. What foods are lower in calcium? Foods that are lower in calcium contain 50 mg or less per serving. Fruits Apple, about 6 mg. Banana, about 12 mg.   Vegetables Lettuce, 19 mg per 2 oz serving. Tomato, about 11 mg. Grains Rice, 4 mg per 6 oz serving. Boiled potatoes, 14 mg per 8 oz serving. White bread, 6 mg per slice. Meats and other proteins Egg, 27 mg per 2 oz serving. Red meat, 7 mg per 4 oz serving. Chicken, 17 mg per 4 oz serving. Fish, cod, or trout, 20 mg per 4 oz serving. Dairy Cream cheese, regular, 14 mg per 1 Tbsp serving. Brie cheese, 50 mg per 1 oz serving. Parmesan cheese, 70 mg per 1 Tbsp serving. The items listed above may not be a complete list of foods lower in calcium. Actual amounts of calcium may be different depending on processing. Contact a dietitian for more information. Summary Calcium is an important mineral in the body because it affects many functions. Getting enough calcium helps support strong bones and teeth throughout your life. Try to get most of your calcium from food. Calcium supplements may interact with certain medicines. Check with your health care provider  or dietitian before starting any calcium supplements. This information is not intended to replace advice given to you by your health care provider. Make sure you discuss any questions you have with your health care provider. Document Revised: 11/13/2019 Document Reviewed: 11/13/2019 Elsevier Patient Education  2023 Elsevier Inc.  

## 2022-09-28 NOTE — Telephone Encounter (Signed)
The patient has had scheduling and communication issues with [facility].  She would prefer to have an appointment with a [facility] endocrinologist.

## 2022-09-28 NOTE — Telephone Encounter (Signed)
Please make a referral for my patient to see endocrinology in [facility] for osteoporosis.

## 2022-09-29 LAB — CA 125: CA 125: 3 U/mL (ref <35)

## 2022-09-29 NOTE — Telephone Encounter (Signed)
Refer to encounter 09/28/2022. Pt desires a referral to [facility] End[facility] practice. Referral has been placed. Will close this encounter.

## 2022-09-29 NOTE — Telephone Encounter (Signed)
Referral sent 

## 2022-10-03 ENCOUNTER — Ambulatory Visit: Payer: PPO | Admitting: Nurse Practitioner

## 2022-10-03 ENCOUNTER — Emergency Department (HOSPITAL_COMMUNITY): Payer: PPO

## 2022-10-03 ENCOUNTER — Emergency Department (HOSPITAL_COMMUNITY)
Admission: EM | Admit: 2022-10-03 | Discharge: 2022-10-03 | Disposition: A | Discharge status: Home / Self Care | Payer: PPO | Attending: Emergency Medicine | Admitting: Emergency Medicine

## 2022-10-03 ENCOUNTER — Encounter (HOSPITAL_COMMUNITY): Payer: Self-pay | Admitting: Emergency Medicine

## 2022-10-03 ENCOUNTER — Inpatient Hospital Stay: Payer: PPO

## 2022-10-03 ENCOUNTER — Inpatient Hospital Stay: Payer: PPO | Admitting: Family

## 2022-10-03 ENCOUNTER — Telehealth: Payer: Self-pay | Admitting: Cardiology

## 2022-10-03 DIAGNOSIS — R911 Solitary pulmonary nodule: Secondary | ICD-10-CM | POA: Diagnosis not present

## 2022-10-03 DIAGNOSIS — R0789 Other chest pain: Secondary | ICD-10-CM | POA: Diagnosis not present

## 2022-10-03 DIAGNOSIS — R079 Chest pain, unspecified: Secondary | ICD-10-CM | POA: Diagnosis not present

## 2022-10-03 LAB — CBC WITH DIFFERENTIAL/PLATELET
Abs Immature Granulocytes: 0.02 10*3/uL (ref 0.00–0.07)
Basophils Absolute: 0 10*3/uL (ref 0.0–0.1)
Basophils Relative: 1 %
Eosinophils Absolute: 0.2 10*3/uL (ref 0.0–0.5)
Eosinophils Relative: 2 %
HCT: 44.1 % (ref 36.0–46.0)
Hemoglobin: 14.7 g/dL (ref 12.0–15.0)
Immature Granulocytes: 0 %
Lymphocytes Relative: 20 %
Lymphs Abs: 1.5 10*3/uL (ref 0.7–4.0)
MCH: 31.1 pg (ref 26.0–34.0)
MCHC: 33.3 g/dL (ref 30.0–36.0)
MCV: 93.4 fL (ref 80.0–100.0)
Monocytes Absolute: 0.5 10*3/uL (ref 0.1–1.0)
Monocytes Relative: 6 %
Neutro Abs: 5.3 10*3/uL (ref 1.7–7.7)
Neutrophils Relative %: 71 %
Platelets: 196 10*3/uL (ref 150–400)
RBC: 4.72 MIL/uL (ref 3.87–5.11)
RDW: 13.1 % (ref 11.5–15.5)
WBC: 7.5 10*3/uL (ref 4.0–10.5)
nRBC: 0 % (ref 0.0–0.2)

## 2022-10-03 LAB — COMPREHENSIVE METABOLIC PANEL
ALT: 21 U/L (ref 0–44)
AST: 27 U/L (ref 15–41)
Albumin: 3.5 g/dL (ref 3.5–5.0)
Alkaline Phosphatase: 53 U/L (ref 38–126)
Anion gap: 5 (ref 5–15)
BUN: 14 mg/dL (ref 8–23)
CO2: 27 mmol/L (ref 22–32)
Calcium: 8.4 mg/dL — ABNORMAL LOW (ref 8.9–10.3)
Chloride: 107 mmol/L (ref 98–111)
Creatinine, Ser: 0.77 mg/dL (ref 0.44–1.00)
GFR, Estimated: >60 mL/min (ref >60)
Glucose, Bld: 113 mg/dL — ABNORMAL HIGH (ref 70–99)
Potassium: 4 mmol/L (ref 3.5–5.1)
Sodium: 139 mmol/L (ref 135–145)
Total Bilirubin: 0.7 mg/dL (ref 0.3–1.2)
Total Protein: 6.6 g/dL (ref 6.5–8.1)

## 2022-10-03 LAB — TROPONIN I (HIGH SENSITIVITY)
Troponin I (High Sensitivity): 8 ng/L (ref <18)
Troponin I (High Sensitivity): 9 ng/L (ref <18)

## 2022-10-03 LAB — PROTIME-INR
INR: 2.3 — ABNORMAL HIGH (ref 0.8–1.2)
Prothrombin Time: 25.4 seconds — ABNORMAL HIGH (ref 11.4–15.2)

## 2022-10-03 MED ORDER — IOHEXOL 350 MG/ML SOLN
73.0000 mL | Freq: Once | INTRAVENOUS | Status: AC | PRN
  Administered 2022-10-03: 73 mL via INTRAVENOUS

## 2022-10-03 NOTE — Telephone Encounter (Signed)
Pt calling to report that she received a call from a gentleman about endo referral but couldn't tell us where/what office he was from but could see that she had her appt w/ [facility] in 01/2023.   Per referral coordinator: She notified pt of the [facility] staffing issue and offered pt a referral to [facility] with Dr. Chalmers Cater. She was told by pt that her PCP was at that location and she has an upcoming appt where she will inquire if she could be seen by that provider in the office for her osteoporosis.    Tried to call pt to relay info. However, received VM. LVMTCB.

## 2022-10-03 NOTE — Discharge Instructions (Addendum)
INR today is 2.3  Your CT did not show a blood clot in the lung.  It did show a lung nodule.  This is something your PCP might decide to reimage at some point based on your risk for lung cancer.    Please return for worsening symptoms, especially when you are exercising.

## 2022-10-03 NOTE — Telephone Encounter (Signed)
Pt c/o of Chest Pain: STAT if CP now or developed within 24 hours  1. Are you having CP right now? Yes.   2. Are you experiencing any other symptoms (ex. SOB, nausea, vomiting, sweating)? Arms feel like that are on fire and some sweating.   3. How long have you been experiencing CP? Started last night around midnight   4. Is your CP continuous or coming and going? Continuous   5. Have you taken Nitroglycerin? No.  ?

## 2022-10-03 NOTE — Telephone Encounter (Signed)
LMOM to discuss appointment that was scheduled today 10/03/2022 @ 10:55. Waiting on a return call. Pt with active chest pain should be evaluated at the ED. Per Diona Browner, NP.

## 2022-10-03 NOTE — ED Provider Notes (Signed)
[facility] Provider Note   CSN: [id number] Arrival date & time: 10/03/22  1003     History {Add pertinent medical, surgical, social history, OB history to HPI:1} Chief Complaint  Patient presents with   Chest Pain    Emma Lopez is a 77 y.o. female.  HPI  77 year old female presents today complaining of left-sided chest pain.  She states it was pressure-like in nature last night at a 3 out of 4.  She was able to go to sleep after taking her Xanax.  States she woke up several times and still felt like she had some discomfort and took another Xanax and went back to sleep.  This morning the anterior chest pressure has resolved there is some discomfort in her left upper back.  She states that is also had some radiation down her arms that feel like they are on fire.  She reports to me that she is on Coumadin for history of DVTs and has had 1 episode of PE in the past.  She does not have any shortness of breath at this time.  However, she states she was not dyspneic when she had the PE in the past and is quite surprised that she had it.  She reports taking her Coumadin as prescribed.  She is being followed by Dr. Marin Olp and reports that she has a lupus anticoagulant.  She reports that she has had multiple episodes of chest pain with multiple workups in the past.  She states that she has never been told that she had coronary artery disease although she was told she has some atherosclerosis of her aorta.  She is followed by Dr. Golden Hurter with cardiology.    Home Medications Prior to Admission medications   Medication Sig Start Date End Date Taking? Authorizing Provider  ALPRAZolam (XANAX) 0.25 MG tablet Take 1 tablet (0.25 mg total) by mouth every 8 (eight) hours as needed for anxiety. 04/05/17   Huel Cote, NP  clobetasol ointment (TEMOVATE) [id number] % Apply 1 Application topically at bedtime. 09/05/22 10/05/22  Nunzio Cobbs, MD  denosumab  (PROLIA) 60 MG/ML SOLN injection Inject 60 mg into the skin every 6 (six) months. Administer in upper arm, thigh, or abdomen 03/05/15   Huel Cote, NP  escitalopram (LEXAPRO) 10 MG tablet Take 10 mg by mouth daily. 04/27/22   [provider]  methocarbamol (ROBAXIN) 500 MG tablet Take 250 mg by mouth daily as needed for muscle spasms.    [provider]  rOPINIRole (REQUIP) 1 MG tablet Take 1 mg by mouth at bedtime.    [provider]  rosuvastatin (CRESTOR) 5 MG tablet Take 5 mg by mouth at bedtime. 04/27/22   [provider]  warfarin (COUMADIN) 1 MG tablet Take 1/2 tablet to 1 tablet by mouth daily or as directed by Anticoagulation Clinic. 06/02/22   Sueanne Margarita, MD      Allergies    Sulfa antibiotics    Review of Systems   Review of Systems  Physical Exam Updated Vital Signs BP (!) 151/92   Pulse (!) 51   Temp 98.1 F (36.7 C) (Oral)   Resp 19   SpO2 100%  Physical Exam Vitals and nursing note reviewed.  Constitutional:      Appearance: She is well-developed.  HENT:     Head: Normocephalic.  Eyes:     Pupils: Pupils are equal, round, and reactive to light.  Cardiovascular:     Rate and Rhythm: Normal rate and regular rhythm.     Heart sounds: Normal heart sounds.  Pulmonary:     Effort: Pulmonary effort is normal.     Breath sounds: Normal breath sounds.  Abdominal:     General: Bowel sounds are normal.  Musculoskeletal:        General: Normal range of motion.     Cervical back: Normal range of motion.     Right lower leg: No tenderness. No edema.     Left lower leg: No tenderness. No edema.  Skin:    General: Skin is warm and dry.     Capillary Refill: Capillary refill takes less than 2 seconds.  Neurological:     General: No focal deficit present.     Mental Status: She is alert.  Psychiatric:        Mood and Affect: Mood normal.     ED Results / Procedures / Treatments   Labs (all labs ordered are listed, but only  abnormal results are displayed) Labs Reviewed  COMPREHENSIVE METABOLIC PANEL - Abnormal; Notable for the following components:      Result Value   Glucose, Bld 113 (*)    Calcium 8.4 (*)    All other components within normal limits  PROTIME-INR - Abnormal; Notable for the following components:   Prothrombin Time 25.4 (*)    INR 2.3 (*)    All other components within normal limits  CBC WITH DIFFERENTIAL/PLATELET  TROPONIN I (HIGH SENSITIVITY)  TROPONIN I (HIGH SENSITIVITY)    EKG EKG Interpretation  Date/Time:  Monday October 03 2022 10:15:48 EST Ventricular Rate:  50 PR Interval:  146 QRS Duration: 100 QT Interval:  416 QTC Calculation: 379 R Axis:   62 Text Interpretation: Sinus bradycardia Anterior infarct , age undetermined Abnormal ECG When compared with ECG of 17-Oct-2021 12:54, PREVIOUS ECG IS PRESENT Confirmed by Pattricia Boss [id number]) on 10/03/2022 11:10:13 AM  Radiology DG Chest 2 View  Result Date: 10/03/2022 CLINICAL DATA:  Chest pain. EXAM: CHEST - 2 VIEW COMPARISON:  10/17/2021. FINDINGS: Clear lungs. Normal heart size and mediastinal contours. No pleural effusion or pneumothorax. Visualized bones and upper abdomen are unremarkable. IMPRESSION: No evidence of acute cardiopulmonary disease. Electronically Signed   By: Emmit Alexanders M.D.   On: 10/03/2022 10:54    Procedures Procedures  {Document cardiac monitor, telemetry assessment procedure when appropriate:1}  Medications Ordered in ED Medications - No data to display  ED Course/ Medical Decision Making/ A&P Clinical Course as of 10/03/22 1702  Mon Oct 03, 2022  1408 Chest x-Nycholas Rayner reviewed interpreted no evidence of acute abnormality and radiologist interpretation concurs [DR]  1700 CBC reviewed interpreted within normal limits [DR]  99991111 Complete metabolic panel is reviewed and interpreted and is significant for mild hypocalcemia otherwise within normal limits Troponin and repeat troponin within normal limits  [DR]    Clinical Course User Index [DR] Pattricia Boss, MD   {   Click here for ABCD2, HEART and other calculatorsREFRESH Note before signing :1}                          Medical Decision Making Amount and/or Complexity of Data Reviewed Labs: ordered. Radiology: ordered.  77 year old female presents today with left-sided chest pain that began last night. She is followed by Dr. Radford Pax and has had previous evaluation with 2D echo normal in 2019 and nuclear stress test that showed no  ischemia in 2019.  Coronary CTA in 2019 had a calcium score of 0.  She has been evaluated by pulmonary for shortness of breath. She does have a history of DVT and PE.  She is anticoagulated.  However, she has had prior CTAs that have noted PE when she has been asymptomatic.  EKG shows sinus bradycardia with nonspecific ST changes that look similar to prior EKGs.  Doubt MI with normal troponins and unchanged EKG. Suspect that she may have some component with anxiety.  She was CT angio pending.  If this is normal then patient would appear appropriate for discharged home.  {Document critical care time when appropriate:1} {Document review of labs and clinical decision tools ie heart score, Chads2Vasc2 etc:1}  {Document your independent review of radiology images, and any outside records:1} {Document your discussion with family members, caretakers, and with consultants:1} {Document social determinants of health affecting pt's care:1} {Document your decision making why or why not admission, treatments were needed:1} Final Clinical Impression(s) / ED Diagnoses Final diagnoses:  None    Rx / DC Orders ED Discharge Orders     None

## 2022-10-03 NOTE — ED Provider Triage Note (Cosign Needed Addendum)
Emergency Medicine Provider Triage Evaluation Note  Emma Lopez , a 77 y.o. female  was evaluated in triage.  Pt complains of chest pressure and bilateral arm tingling onset last night. History of similar episodes evaluated in ED with normal evaluations, diagnosed with anxiety. No relief with breathing exercises and Xanax since onset. No SOB, n/v/d, fever, chills, abdominal pain, focal weakness, headache, vision changes, radiating pain, leg pain or swelling. Hx DVT/PE compliant with Coumadin.   Review of Systems  Positive: See HPI Negative: See HPI  Physical Exam  BP (!) 150/58 (BP Location: Right Arm)   Pulse (!) 51   Temp (!) 97.4 F (36.3 C) (Oral)   Resp 19   SpO2 100%  Gen:   Awake, no distress   Resp:  Normal effort LCTA MSK:   Moves extremities without difficulty  Other:  No LE edema or tenderness  Medical Decision Making  Medically screening exam initiated at 10:17 AM.  Appropriate orders placed.  [name] was informed that the remainder of the evaluation will be completed by another provider, this initial triage assessment does not replace that evaluation, and the importance of remaining in the ED until their evaluation is complete.     Suzzette Righter, PA-C 10/03/22 1020    GowensArvella Merles, PA-C 10/03/22 1022

## 2022-10-03 NOTE — Telephone Encounter (Signed)
Spoke with pt @ 9:41 AM. Pt is aware with active chest pain and sweating, she needs to be evaluated by the ED. Pt has arrange for a neighbor to take her to the ED right now. Appointment scheduled for today 10/03/2022 @ 10:55 has been cancelled.

## 2022-10-03 NOTE — Telephone Encounter (Signed)
Spoke with patient who states she has felt these same feelings before and have been associated with panic attacks. Says last night she felt some pressure on left side of pressure while laying in bed, prior to falling asleep. Was "doing a lot of burping" from what I ate. States she has a history of this, so got up and got a paper bag and began practicing deep breathing, took a xanax and states she went to sleep. Felt burning in her bilateral arms with heaviness. Awoke "about 3:30-4" and still felt a little bit of the pressure. Denies nausea and vomiting. Has not taken a nitroglycerin and states she doesn't have an rx at this time. Has not checked her blood pressure, states she doesn't have a cuff. Denies shortness of breath, dizziness/lightheadedness. Condones fatigue for "the last week" but attributed it to fibromyalgia.   Explained to patient that this is very suspicious for coronary event in females and I recommended she go to ED. I explained that from an outpatient standpoint, there wasn't much we could do for her and would likely be sending her to ED anyway-that she would be doing herself an injustice by not just going straight there. Patient replies, "I'll see what I can do." Currently scheduled for 10:55 (less than one hour) at [place] office, but understands that the recommendation is to go to ED.

## 2022-10-03 NOTE — Telephone Encounter (Signed)
Emma Lopez w/ pt and she reported that she feels that she has seen Dr. Chalmers Cater in the past for her thyroid and doesn't recall "liking her too well."  Pt states for now, she still has her appt [facility]/ [facility] and will call us if anything changes or if she decides to try referral at another location. Will route to provider for final review.

## 2022-10-03 NOTE — ED Provider Notes (Signed)
Received patient in turnover from Dr. Jeanell Sparrow.  Please see their note for further details of Hx, PE.  Briefly patient is a 77 y.o. female with a Chest Pain .  Patient found to be low risk and has 2 negative troponins.  She does have a history of PE and there is some concern for that possibility despite her having a therapeutic INR.  PE scan pending.  CT angiogram of the chest is negative for pulmonary embolism.  She did incidentally have a right lower lung nodule.  I discussed this with her.  Encouraged her to follow-up with her family doctor for perhaps further imaging if needed.Tyrone Nine, Linna Hoff, DO 10/03/22 [phone]

## 2022-10-03 NOTE — ED Triage Notes (Addendum)
Pt reports feeling exhausted this week. At bedtime felt pressure on left side and both arms felt like on fire. She got up and took xanax bc hx anxiety. She went to sleep and felt arm burnig still in middle of night. Took half xanax and went back to sleep.  This morning work up to chest pressure/ tightness radiating into back and still having burning sensation on both arms.  PT takes coumadin for hx DVTs. She did call cards office and tried to get in with cardiologist and they said she needed to come here.

## 2022-10-04 ENCOUNTER — Ambulatory Visit: Payer: PPO | Admitting: Neurology

## 2022-10-04 NOTE — Telephone Encounter (Signed)
Encounter reviewed and closed.  

## 2022-10-05 DIAGNOSIS — R7303 Prediabetes: Secondary | ICD-10-CM | POA: Diagnosis not present

## 2022-10-05 DIAGNOSIS — M810 Age-related osteoporosis without current pathological fracture: Secondary | ICD-10-CM | POA: Diagnosis not present

## 2022-10-05 DIAGNOSIS — F339 Major depressive disorder, recurrent, unspecified: Secondary | ICD-10-CM | POA: Diagnosis not present

## 2022-10-05 DIAGNOSIS — G629 Polyneuropathy, unspecified: Secondary | ICD-10-CM | POA: Diagnosis not present

## 2022-10-05 DIAGNOSIS — R911 Solitary pulmonary nodule: Secondary | ICD-10-CM | POA: Diagnosis not present

## 2022-10-05 DIAGNOSIS — R41840 Attention and concentration deficit: Secondary | ICD-10-CM | POA: Diagnosis not present

## 2022-10-05 DIAGNOSIS — M797 Fibromyalgia: Secondary | ICD-10-CM | POA: Diagnosis not present

## 2022-10-05 DIAGNOSIS — F419 Anxiety disorder, unspecified: Secondary | ICD-10-CM | POA: Diagnosis not present

## 2022-10-05 DIAGNOSIS — E042 Nontoxic multinodular goiter: Secondary | ICD-10-CM | POA: Diagnosis not present

## 2022-10-05 LAB — LAB REPORT - SCANNED: A1c: 5.6

## 2022-10-05 LAB — CYTOLOGY - PAP
Comment: NEGATIVE
Diagnosis: NEGATIVE
High risk HPV: NEGATIVE

## 2022-10-06 ENCOUNTER — Encounter: Payer: Self-pay | Admitting: Hematology & Oncology

## 2022-10-11 ENCOUNTER — Telehealth: Payer: Self-pay | Admitting: *Deleted

## 2022-10-11 NOTE — Telephone Encounter (Signed)
Per scheduling message Emma Lopez - called patient and lvm of upcoming appointments - requested call back to confirm 

## 2022-10-17 ENCOUNTER — Encounter: Payer: Self-pay | Admitting: Obstetrics and Gynecology

## 2022-10-21 DIAGNOSIS — M810 Age-related osteoporosis without current pathological fracture: Secondary | ICD-10-CM | POA: Diagnosis not present

## 2022-10-21 DIAGNOSIS — E042 Nontoxic multinodular goiter: Secondary | ICD-10-CM | POA: Diagnosis not present

## 2022-10-21 DIAGNOSIS — Z79899 Other long term (current) drug therapy: Secondary | ICD-10-CM | POA: Diagnosis not present

## 2022-10-21 DIAGNOSIS — R911 Solitary pulmonary nodule: Secondary | ICD-10-CM | POA: Diagnosis not present

## 2022-10-27 DIAGNOSIS — F339 Major depressive disorder, recurrent, unspecified: Secondary | ICD-10-CM | POA: Diagnosis not present

## 2022-10-27 DIAGNOSIS — R0602 Shortness of breath: Secondary | ICD-10-CM | POA: Diagnosis not present

## 2022-10-27 DIAGNOSIS — F419 Anxiety disorder, unspecified: Secondary | ICD-10-CM | POA: Diagnosis not present

## 2022-10-29 DIAGNOSIS — F419 Anxiety disorder, unspecified: Secondary | ICD-10-CM | POA: Diagnosis not present

## 2022-10-29 DIAGNOSIS — I459 Conduction disorder, unspecified: Secondary | ICD-10-CM | POA: Diagnosis not present

## 2022-10-29 DIAGNOSIS — R5383 Other fatigue: Secondary | ICD-10-CM | POA: Diagnosis not present

## 2022-10-31 ENCOUNTER — Emergency Department (HOSPITAL_COMMUNITY)
Admission: EM | Admit: 2022-10-31 | Discharge: 2022-10-31 | Disposition: A | Discharge status: Home / Self Care | Payer: PPO | Attending: Emergency Medicine | Admitting: Emergency Medicine

## 2022-10-31 ENCOUNTER — Emergency Department (HOSPITAL_COMMUNITY): Payer: PPO

## 2022-10-31 ENCOUNTER — Encounter (HOSPITAL_COMMUNITY): Payer: Self-pay | Admitting: Emergency Medicine

## 2022-10-31 ENCOUNTER — Other Ambulatory Visit: Payer: Self-pay

## 2022-10-31 DIAGNOSIS — M546 Pain in thoracic spine: Secondary | ICD-10-CM | POA: Insufficient documentation

## 2022-10-31 DIAGNOSIS — R0789 Other chest pain: Secondary | ICD-10-CM | POA: Diagnosis not present

## 2022-10-31 DIAGNOSIS — R079 Chest pain, unspecified: Secondary | ICD-10-CM | POA: Diagnosis not present

## 2022-10-31 DIAGNOSIS — Z7901 Long term (current) use of anticoagulants: Secondary | ICD-10-CM | POA: Diagnosis not present

## 2022-10-31 DIAGNOSIS — R457 State of emotional shock and stress, unspecified: Secondary | ICD-10-CM | POA: Diagnosis not present

## 2022-10-31 DIAGNOSIS — R202 Paresthesia of skin: Secondary | ICD-10-CM | POA: Diagnosis not present

## 2022-10-31 LAB — CBC WITH DIFFERENTIAL/PLATELET
Abs Immature Granulocytes: 0.03 10*3/uL (ref 0.00–0.07)
Basophils Absolute: 0.1 10*3/uL (ref 0.0–0.1)
Basophils Relative: 1 %
Eosinophils Absolute: 0 10*3/uL (ref 0.0–0.5)
Eosinophils Relative: 0 %
HCT: 42.4 % (ref 36.0–46.0)
Hemoglobin: 14.4 g/dL (ref 12.0–15.0)
Immature Granulocytes: 0 %
Lymphocytes Relative: 18 %
Lymphs Abs: 1.4 10*3/uL (ref 0.7–4.0)
MCH: 31.1 pg (ref 26.0–34.0)
MCHC: 34 g/dL (ref 30.0–36.0)
MCV: 91.6 fL (ref 80.0–100.0)
Monocytes Absolute: 0.5 10*3/uL (ref 0.1–1.0)
Monocytes Relative: 7 %
Neutro Abs: 5.6 10*3/uL (ref 1.7–7.7)
Neutrophils Relative %: 74 %
Platelets: 216 10*3/uL (ref 150–400)
RBC: 4.63 MIL/uL (ref 3.87–5.11)
RDW: 13.2 % (ref 11.5–15.5)
WBC: 7.6 10*3/uL (ref 4.0–10.5)
nRBC: 0 % (ref 0.0–0.2)

## 2022-10-31 LAB — COMPREHENSIVE METABOLIC PANEL
ALT: 18 U/L (ref 0–44)
AST: 22 U/L (ref 15–41)
Albumin: 3.4 g/dL — ABNORMAL LOW (ref 3.5–5.0)
Alkaline Phosphatase: 52 U/L (ref 38–126)
Anion gap: 9 (ref 5–15)
BUN: 17 mg/dL (ref 8–23)
CO2: 22 mmol/L (ref 22–32)
Calcium: 8.7 mg/dL — ABNORMAL LOW (ref 8.9–10.3)
Chloride: 106 mmol/L (ref 98–111)
Creatinine, Ser: 0.85 mg/dL (ref 0.44–1.00)
GFR, Estimated: >60 mL/min (ref >60)
Glucose, Bld: 102 mg/dL — ABNORMAL HIGH (ref 70–99)
Potassium: 4 mmol/L (ref 3.5–5.1)
Sodium: 137 mmol/L (ref 135–145)
Total Bilirubin: 0.5 mg/dL (ref 0.3–1.2)
Total Protein: 6.5 g/dL (ref 6.5–8.1)

## 2022-10-31 LAB — TROPONIN I (HIGH SENSITIVITY)
Troponin I (High Sensitivity): 7 ng/L (ref <18)
Troponin I (High Sensitivity): 7 ng/L (ref <18)

## 2022-10-31 LAB — PROTIME-INR
INR: 2.4 — ABNORMAL HIGH (ref 0.8–1.2)
Prothrombin Time: 25.5 seconds — ABNORMAL HIGH (ref 11.4–15.2)

## 2022-10-31 NOTE — ED Provider Triage Note (Signed)
Emergency Medicine Provider Triage Evaluation Note  Emma Lopez , a 77 y.o. female  was evaluated in triage.  Pt complains of chest pain radiating to back and bilateral arms and legs for the last 4 years. States more frequent and not going away. Has had multiple negative MRIs, stress tests and CT scans per pt..  Review of Systems  Positive: Chest pain, back pain Negative: SOB  Physical Exam  BP (!) 117/55 (BP Location: Right Arm)   Pulse (!) 52   Temp 98.1 F (36.7 C) (Oral)   Resp 18   Ht 5\' 2"  (1.575 m)   Wt 58.1 kg   SpO2 98%   BMI 23.41 kg/m  Gen:   Awake, no distress   Resp:  Normal effort  MSK:   Moves extremities without difficulty  Other:    Medical Decision Making  Medically screening exam initiated at 12:57 PM.  Appropriate orders placed.  [name] was informed that the remainder of the evaluation will be completed by another provider, this initial triage assessment does not replace that evaluation, and the importance of remaining in the ED until their evaluation is complete.     Osvaldo Shipper, [place] 10/31/22 1258

## 2022-10-31 NOTE — ED Provider Notes (Signed)
Emma Lopez Note   CSN: [id number] Arrival date & time: 10/31/22  1236     History  Chief Complaint  Patient presents with   Chest Pain    Emma Lopez is a 77 y.o. female.  HPI 77 year old female with a history of DVT on warfarin, anxiety, fibromyalgia, depression, and multiple other comorbidities presents with chest pain.  Started yesterday but was still present when she woke up today.  It is a pain in the inferior chest and she will get tingling in both hands.  She also will feel a small sharp pain in the mid thoracic back.  The symptoms have been coming and going for years.  However her most recent episode was at the beginning of March and now is recurring which is faster than typical.  She tried a couple doses of Xanax this morning without much relief.  She is not sure if it is related to recent medication changes such as decreasing her Lexapro.  She has missed her last couple cardiology appointments and next is in May. Whenever these have happened in the past there is no clear treatment that seems to help.  Also shows me an EKG from a few days ago at the walk-in [facility].  There they had an EKG that was concerning for delta waves.  She notes that she does have bradycardia and has been told she has had some tachycardia in the past but has never had any significant palpitations or syncope/near syncope.  Home Medications Prior to Admission medications   Medication Sig Start Date End Date Taking? Authorizing Lopez  ALPRAZolam (XANAX) 0.25 MG tablet Take 1 tablet (0.25 mg total) by mouth every 8 (eight) hours as needed for anxiety. 04/05/17   Huel Cote, NP  denosumab (PROLIA) 60 MG/ML SOLN injection Inject 60 mg into the skin every 6 (six) months. Administer in upper arm, thigh, or abdomen 03/05/15   Huel Cote, NP  escitalopram (LEXAPRO) 10 MG tablet Take 10 mg by mouth daily. 04/27/22   Lopez, Historical, MD   methocarbamol (ROBAXIN) 500 MG tablet Take 250 mg by mouth daily as needed for muscle spasms.    Lopez, Historical, MD  rOPINIRole (REQUIP) 1 MG tablet Take 1 mg by mouth at bedtime.    Lopez, Historical, MD  rosuvastatin (CRESTOR) 5 MG tablet Take 5 mg by mouth at bedtime. 04/27/22   Lopez, Historical, MD  warfarin (COUMADIN) 1 MG tablet Take 1/2 tablet to 1 tablet by mouth daily or as directed by Anticoagulation Clinic. 06/02/22   Sueanne Margarita, MD      Allergies    Sulfa antibiotics    Review of Systems   Review of Systems  Cardiovascular:  Positive for chest pain.  Gastrointestinal:  Negative for abdominal pain.  Musculoskeletal:  Positive for back pain.  Neurological:  Positive for numbness (tingling).    Physical Exam Updated Vital Signs BP (!) 143/72   Pulse (!) 49   Temp 98.1 F (36.7 C) (Oral)   Resp 18   Ht 5\' 2"  (1.575 m)   Wt 58.1 kg   SpO2 100%   BMI 23.41 kg/m  Physical Exam Vitals and nursing note reviewed.  Constitutional:      Appearance: She is well-developed.  HENT:     Head: Normocephalic and atraumatic.  Cardiovascular:     Rate and Rhythm: Normal rate and regular rhythm.     Pulses:  Radial pulses are 2+ on the right side and 2+ on the left side.     Heart sounds: Normal heart sounds.  Pulmonary:     Effort: Pulmonary effort is normal.     Breath sounds: Normal breath sounds.  Abdominal:     Palpations: Abdomen is soft.     Tenderness: There is no abdominal tenderness.  Musculoskeletal:     Thoracic back: No tenderness.  Skin:    General: Skin is warm and dry.  Neurological:     Mental Status: She is alert.  Psychiatric:     Comments: Patient is not overtly anxious though there is some mild lip trembling while she's talking     ED Results / Procedures / Treatments   Labs (all labs ordered are listed, but only abnormal results are displayed) Labs Reviewed  COMPREHENSIVE METABOLIC PANEL - Abnormal; Notable for the  following components:      Result Value   Glucose, Bld 102 (*)    Calcium 8.7 (*)    Albumin 3.4 (*)    All other components within normal limits  PROTIME-INR - Abnormal; Notable for the following components:   Prothrombin Time 25.5 (*)    INR 2.4 (*)    All other components within normal limits  CBC WITH DIFFERENTIAL/PLATELET  TROPONIN I (HIGH SENSITIVITY)  TROPONIN I (HIGH SENSITIVITY)    EKG EKG Interpretation  Date/Time:  Monday October 31 2022 12:45:08 EDT Ventricular Rate:  55 PR Interval:  132 QRS Duration: 108 QT Interval:  418 QTC Calculation: 399 R Axis:   74 Text Interpretation: Sinus bradycardia with Premature atrial complexes Septal infarct , age undetermined  no significant change since Oct 03 2022 Confirmed by Sherwood Gambler [phone]) on 10/31/2022 6:50:57 PM  Radiology DG Chest 2 View  Result Date: 10/31/2022 CLINICAL DATA:  Chest pressure. EXAM: CHEST - 2 VIEW COMPARISON:  10/03/2022. FINDINGS: Cardiac silhouette normal in size and configuration. Normal mediastinal and hilar contours. Lungs are hyperexpanded, but clear. No pleural effusion or pneumothorax. Skeletal structures are intact. IMPRESSION: No active cardiopulmonary disease. Electronically Signed   By: Lajean Manes M.D.   On: 10/31/2022 13:32    Procedures Procedures    Medications Ordered in ED Medications - No data to display  ED Course/ Medical Decision Making/ A&P                             Medical Decision Making Amount and/or Complexity of Data Reviewed Independent Historian:     Details: Cousin External Data Reviewed: notes. Labs: ordered.    Details: Troponins normal x 2.  Normal hemoglobin, no significant electrolyte disturbance.  INR of 2.4 is slightly lower than her goal of 2.5-3.5 Radiology: independent interpretation performed.    Details: No CHF ECG/medicine tests: ordered and independent interpretation performed.    Details: No change from baseline   Patient is having  recurrent chest pain.  She been having this for a long time and has had an extensive workup, some of which I reviewed through the chart.  No prior history of ACS.  I doubt this is ACS and while she has had a previous history of PE, her INR of 2.4 makes me think this is less likely.  I do not think she needs repeat CT and she just had a CT earlier this month for similar symptoms which was negative.  Troponins are negative x 2.  This has been a recurrent issue  for her for years while there may be an anxiety component and will be hard to tell.  Either way I think she is stable for discharge.  Highly doubt dissection.        Final Clinical Impression(s) / ED Diagnoses Final diagnoses:  Nonspecific chest pain    Rx / DC Orders ED Discharge Orders          Ordered    Ambulatory referral to Cardiology       Comments: If you have not heard from the Cardiology office within the next 72 hours please call [phone].   10/31/22 2000              Sherwood Gambler, MD 10/31/22 2250

## 2022-10-31 NOTE — ED Notes (Signed)
Lab called to add on pt inr

## 2022-10-31 NOTE — Discharge Instructions (Signed)
If you develop recurrent, continued, or worsening chest pain, shortness of breath, fever, vomiting, abdominal or back pain, or any other new/concerning symptoms then return to the ER for evaluation.  

## 2022-10-31 NOTE — ED Triage Notes (Signed)
Per [facility] pt coming from home c/o chest pressure, back stabbing and bilateral arm tingling x 3 weeks. Patient states she has been seen here, UC and PCP. Has been taking xanax to help with symptoms. Has had two this morning. No EKG done with EMS.

## 2022-11-03 ENCOUNTER — Telehealth: Payer: Self-pay | Admitting: Cardiology

## 2022-11-03 DIAGNOSIS — R079 Chest pain, unspecified: Secondary | ICD-10-CM

## 2022-11-03 NOTE — Telephone Encounter (Signed)
Called patient to discuss her concerns about her chronic chest pressure and intermittent dyspnea. Patient states she has had chronic chest pain/pressure for several years. Last OV with Dr. Radford Pax 12/15/20, she discussed DOE and chest pain with Dr. Radford Pax at that time. Patient was seen by APP on 07/22/21 and did not report chest pain at that time. She has had ED visit for chest pain on 10/17/21, 10/03/22, and recently on 10/31/22. She states that usually her xanax alleviates her symptoms, but on Monday 10/31/22 she took 2 doses of xanax and when it didn't seem to help she reported to ED. She states those were actually her last 2 doses of xanax and she just got a refill on her xanax today. She states her trip to the ED on 10/31/22 did not result in any changes to her treatment plan.   At the time of this call she denies chest pain or SOB but states she has continued to have intermittent chest pressure/SOB on exertion and at rest since Monday. Today she was able to drive to her pharmacy to pick up her xanax refill.   Patient has office visit with Christen Bame NP on 11/09/22. I checked the DOD schedule and we have no openings between now nad 11/09/22. There are no openings for Dr. Radford Pax or any APP between now and 11/09/22. Advised patient to continue to manage her chest discomfort and DOE with rest and PRN xanax, track her HR and BP and bring those results to her appt on 11/09/22. Also reviewed ED precautions and advised patient to report to ED for any worsening chest pain, especially if it is accompanied by arm or jaw tingling, nausea, SOB. Patient verbalizes understanding and agrees to plan.

## 2022-11-03 NOTE — Telephone Encounter (Signed)
Pt c/o of Chest Pain: STAT if CP now or developed within 24 hours  1. Are you having CP right now? Light pressure  2. Are you experiencing any other symptoms (ex. SOB, nausea, vomiting, sweating)? Light burning sensation  3. How long have you been experiencing CP? Going on for weeks  4. Is your CP continuous or coming and going? Coming and going  5. Have you taken Nitroglycerin? No  ?

## 2022-11-04 ENCOUNTER — Telehealth: Payer: Self-pay

## 2022-11-04 ENCOUNTER — Ambulatory Visit: Payer: PPO | Attending: Cardiology

## 2022-11-04 ENCOUNTER — Ambulatory Visit (INDEPENDENT_AMBULATORY_CARE_PROVIDER_SITE_OTHER): Payer: PPO

## 2022-11-04 VITALS — BP 100/64 | HR 45 | Ht 62.0 in | Wt 120.4 lb

## 2022-11-04 DIAGNOSIS — Z5181 Encounter for therapeutic drug level monitoring: Secondary | ICD-10-CM

## 2022-11-04 DIAGNOSIS — R0789 Other chest pain: Secondary | ICD-10-CM | POA: Diagnosis not present

## 2022-11-04 LAB — POCT INR: INR: 3.1 — AB (ref 2.0–3.0)

## 2022-11-04 NOTE — Addendum Note (Signed)
Addended by: Bea Laura B on: 11/04/2022 10:29 AM   Modules accepted: Orders

## 2022-11-04 NOTE — Telephone Encounter (Signed)
Returned call to patient who states that she just recently had troponin drew on Monday and does not wish to repeat this. Patient is open to getting EKG- patient will get EKG at Nurse visit today. Will forward to make MD aware.

## 2022-11-04 NOTE — Telephone Encounter (Signed)
     Patient  visit on 10/31/2022  at [facility] was for chest pain.  Have you been able to follow up with your primary care physician? Yes  The patient was or was not able to obtain any needed medicine or equipment. No medication prescribed.  Are there diet recommendations that you are having difficulty following? No  Patient expresses understanding of discharge instructions and education provided has no other needs at this time. Yes   Valdis Bevill [facility]  [facility] Population Health Community Resource Care Guide   [email or web address]  ?? [phone]   [email or web address]: [email or web address]  [email or web address]

## 2022-11-04 NOTE — Telephone Encounter (Signed)
Spoke with patient to review Dr. Norris Cross recommendations and confirm appointment for 11/09/2022. Patient verbalized understanding and had no questions.

## 2022-11-04 NOTE — Progress Notes (Signed)
   Nurse Visit   Date of Encounter: 11/04/2022 ID: Emma BERTRAND, DOB 03-01-1946, MRN [id number]  PCP:  Melida Quitter, MD   [facility] Providers Cardiologist:  Armanda Magic, MD      Visit Details   VS:  BP 100/64 (BP Location: Left Arm, Patient Position: Sitting, Cuff Size: Normal)   Pulse (!) 45   Ht 5\' 2"  (1.575 m)   Wt 120 lb 6.4 oz (54.6 kg)   SpO2 100%   BMI 22.02 kg/m  , BMI Body mass index is 22.02 kg/m.  Wt Readings from Last 3 Encounters:  11/04/22 120 lb 6.4 oz (54.6 kg)  10/31/22 128 lb (58.1 kg)  09/28/22 128 lb (58.1 kg)     Reason for visit: EKG- Per Dr. Mayford Knife- requested EKG due to chest tightness. Dr. Royann Shivers (DOD) reviewed EKG and states no changes noted. EKG placed to be scanned into chart for review. Patient refused repeat STAT troponin due to recently having one drawn this week. Made patient aware of ED precautions should new or worsening symptoms develop. Patient verbalized understanding.   Performed today: Vitals, EKG, Provider consulted:Dr. Croitoru, and Education  Changes (medications, testing, etc.) : None   Length of Visit: 20 minutes    Medications Adjustments/Labs and Tests Ordered: No orders of the defined types were placed in this encounter.  No orders of the defined types were placed in this encounter.    Signed, Baird Cancer, RN  11/04/2022 12:01 PM

## 2022-11-04 NOTE — Patient Instructions (Signed)
Description   Continue taking Warfarin 1 tablet daily except for 1/2 tablet on Sundays, Tuesdays, and Thursdays.  Recheck INR 6 weeks.   [facility] [phone]     

## 2022-11-04 NOTE — Telephone Encounter (Signed)
Returned call to patient to follow-up on her chest pressure.  Patient states she is still having some light chest pressure this morning, and dull ache to left side (1-2/10--constant, no change with deep breath). States this is the same as yesterday, not worse/not better.  Patient states she took 1 Xanax yesterday afternoon and it helped her feel more relaxed through the evening, unable to recall if chest pressure was relieved at that time. She is going to take another dose of Xanax this morning, take a shower, and go to her Coumadin Clinic appointment.  Will forward to Dr. Mayford Knife to review and advise.

## 2022-11-04 NOTE — Telephone Encounter (Signed)
Added patient to nurse visit schedule today for 11am for EKG- order placed for STAT Troponin at [facility] here in office.   Attempted to call patient, left message for patient to call back to office.

## 2022-11-07 ENCOUNTER — Encounter: Payer: Self-pay | Admitting: Hematology & Oncology

## 2022-11-09 ENCOUNTER — Ambulatory Visit: Payer: PPO | Attending: Nurse Practitioner | Admitting: Nurse Practitioner

## 2022-11-09 ENCOUNTER — Encounter: Payer: Self-pay | Admitting: Nurse Practitioner

## 2022-11-09 VITALS — BP 128/59 | HR 89 | Ht 62.0 in | Wt 121.6 lb

## 2022-11-09 DIAGNOSIS — R5381 Other malaise: Secondary | ICD-10-CM

## 2022-11-09 DIAGNOSIS — R001 Bradycardia, unspecified: Secondary | ICD-10-CM | POA: Diagnosis not present

## 2022-11-09 DIAGNOSIS — E785 Hyperlipidemia, unspecified: Secondary | ICD-10-CM

## 2022-11-09 DIAGNOSIS — Z86711 Personal history of pulmonary embolism: Secondary | ICD-10-CM | POA: Diagnosis not present

## 2022-11-09 DIAGNOSIS — R5382 Chronic fatigue, unspecified: Secondary | ICD-10-CM

## 2022-11-09 DIAGNOSIS — R079 Chest pain, unspecified: Secondary | ICD-10-CM | POA: Diagnosis not present

## 2022-11-09 DIAGNOSIS — Z7901 Long term (current) use of anticoagulants: Secondary | ICD-10-CM | POA: Diagnosis not present

## 2022-11-09 MED ORDER — METOPROLOL TARTRATE 50 MG PO TABS: ORAL_TABLET | ORAL | 0 refills | Status: DC

## 2022-11-09 NOTE — Progress Notes (Addendum)
Cardiology Office Note:    Date:  11/09/2022   ID:  Emma Lopez, DOB October 01, 1945, MRN [id number]  PCP:  Melida Quitter, MD   [facility] Providers Cardiologist:  Armanda Magic, MD     Referring MD: Pricilla Loveless, MD   Chief Complaint: chest pain  History of Present Illness:    Emma Lopez is a pleasant 77 y.o. female with a hx of recurrent DVT/PE with positive lupus anticoagulant (followed by Dr. Myna Hidalgo), HLD, sinus bradycardia, GERD, intermittent LBBB, hyperthyroidism and anxiety.   Established cardiology care with Dr. Mayford Knife. Coronary CTA 12/2017 normal with calcium score 0 without significant CAD.  Event monitor 06/2020 showed average HR 55, range 45 to 127 bpm, nonsustained atrial tachycardia max 19 beats, accelerated junctional rhythm, rare PAC/PVC, atrial triplet.  ETT was performed to assess for chronotropic incompetence with HR response to 148 bpm, 4.6 METS, hypertensive response with ST-TW changes after exercise built intermediate risk in the context of baseline EKG abnormalities (appears she had LBBB at that time>> previously told it only appears intermittently).  She had recently also had dobutamine stress at an outside facility 06/2020 that showed no evidence of ischemia so Dr. Mayford Knife felt that the ETT was false positive. F/u ambulatory BP monitor 09/2020 showed average BP 135/55.  2D echo 10/2020 EF 60 to 65%, normal RV without significant abnormalities.  At follow-up office visit 11/2020 she continued to complain of fatigue and dyspnea so CPX was ordered but she did not pursue this.  For her history of recurrent VTE she was previously on Xarelto and Pradaxa but is now on Coumadin recommendation for lifelong.  Last cardiology clinic visit was 07/05/2021 with Ronie Spies, PA. She reported chronic fatigue which has been present for a long time.  This was originally diagnosed in the context of fibromyalgia.  She was struggling with RLS which interrupts her sleeping ability.  Ferritin  level was checked due to RLS which was normal.  She was advised to return in 1 year for follow-up.  Had virtual visit 01/04/22 for pending colonoscopy for which she was deemed low risk.  ED visit 10/31/2022 with chest pain. Troponins normal x 2.  Normal hemoglobin, no significant electrolyte disturbance.  She revealed sinus bradycardia with premature atrial complexes, septal infarct age undetermined.   Today, she is here for follow-up of chest pain. Feels that symptoms of pressure around heart for which she went to ED have occurred twice recently, wonders if they are caused by anxiety. Sometimes it feels like a pin is sticking in her back directly behind her heart. Feels like she does not inhale well enough and sometimes feels like she takes in too much air and needs to belch to release it. Also feels pressure between her breasts, occurs separately at times from other symptoms. These symptoms have occurred for many years, seem to be more persistent recently. She denies dyspnea but does not exercise regularly. She denies edema, orthopnea, PND, diaphoresis, n/v, presyncope or syncope. Stopped statin 10/31/22 due to concern that was worsening her fibromyalgia. Seeing GI also for midsternal pain. PCP prescribed rosuvastatin in September due to LDL 145. Limiting carbs, reduced sugar intake.   Past Medical History:  Diagnosis Date   Abnormal EKG    Anxiety    Arthritis    oa   Bradycardia 12/14/2017   CIN III (cervical intraepithelial neoplasia III) 2000   Complication of anesthesia    did well last 2 times with procedures   Depression  DES exposure in utero    DVT (deep venous thrombosis) 01/2009   LEFT LEG   DVT (deep venous thrombosis) 10/30/2020   Dyspnea on exertion 10/2017   Elevated triglycerides with high cholesterol    Endometriosis    Fibromyalgia    GERD (gastroesophageal reflux disease)    History of colon polyps    History of hiatal hernia    told by some md has, some say not   IBS  (irritable bowel syndrome) 2015   Left bundle branch block    intermittent   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   Lupus anticoagulant disorder    Multinodular goiter    Osteoporosis 12/2017   T score -3.2 stable from prior study   PE (pulmonary embolism)    Pneumonia 03/01/2021   PONV (postoperative nausea and vomiting)    Restless legs syndrome    Sinus bradycardia    Sleep disorder    Thyroid disease    HYPERTHYROIDISM   Vitamin D deficiency     Past Surgical History:  Procedure Laterality Date   ABDOMINAL HYSTERECTOMY  2001   TAH, partial   APPENDECTOMY  1978   COLONOSCOPY WITH PROPOFOL N/A 12/07/2015   Procedure: COLONOSCOPY WITH PROPOFOL;  Surgeon: Charolett Bumpers, MD;  Location: [facility];  Service: Endoscopy;  Laterality: N/A;   COLONOSCOPY WITH PROPOFOL N/A 01/11/2022   Procedure: COLONOSCOPY WITH PROPOFOL;  Surgeon: Kerin Salen, MD;  Location: [facility];  Service: Gastroenterology;  Laterality: N/A;   colonscopy  7 yrs ago   other in past   ESOPHAGOGASTRODUODENOSCOPY (EGD) WITH PROPOFOL N/A 12/07/2015   Procedure: ESOPHAGOGASTRODUODENOSCOPY (EGD) WITH PROPOFOL;  Surgeon: Charolett Bumpers, MD;  Location: [facility];  Service: Endoscopy;  Laterality: N/A;   ESOPHAGOGASTRODUODENOSCOPY ENDOSCOPY  yrs ago   FACIAL COSMETIC SURGERY     GYNECOLOGIC CRYOSURGERY     HEMOSTASIS CLIP PLACEMENT  01/11/2022   Procedure: HEMOSTASIS CLIP PLACEMENT;  Surgeon: Kerin Salen, MD;  Location: [facility];  Service: Gastroenterology;;   HOT HEMOSTASIS N/A 01/11/2022   Procedure: HOT HEMOSTASIS (ARGON PLASMA COAGULATION/BICAP);  Surgeon: Kerin Salen, MD;  Location: [facility];  Service: Gastroenterology;  Laterality: N/A;   MYOMECTOMY     POLYPECTOMY  01/11/2022   Procedure: POLYPECTOMY;  Surgeon: Kerin Salen, MD;  Location: [facility];  Service: Gastroenterology;;   REPLACEMENT TOTAL JOINT WRIST W/ PROSTHETIC IMPLANT Right 08/01/2021   TONSILLECTOMY       Current Medications: Current Meds  Medication Sig   ALPRAZolam (XANAX) 0.25 MG tablet Take 1 tablet (0.25 mg total) by mouth every 8 (eight) hours as needed for anxiety.   denosumab (PROLIA) 60 MG/ML SOLN injection Inject 60 mg into the skin every 6 (six) months. Administer in upper arm, thigh, or abdomen   escitalopram (LEXAPRO) 20 MG tablet Take 20 mg by mouth daily.   methocarbamol (ROBAXIN) 500 MG tablet Take 250 mg by mouth daily as needed for muscle spasms.   metoprolol tartrate (LOPRESSOR) 50 MG tablet Take one (1) tablet by mouth ( 50 mg) 2 hours prior to CT scan.   rOPINIRole (REQUIP) 1 MG tablet Take 1 mg by mouth at bedtime.   warfarin (COUMADIN) 1 MG tablet Take 1/2 tablet to 1 tablet by mouth daily or as directed by Anticoagulation Clinic.   Current Facility-Administered Medications for the 11/09/22 encounter (Office Visit) with Levi Aland, NP  Medication   denosumab (PROLIA) injection 60 mg     Allergies:  Sulfa antibiotics   Social History   Socioeconomic History   Marital status: Single    Spouse name: Not on file   Number of children: Not on file   Years of education: Not on file   Highest education level: Not on file  Occupational History   Not on file  Tobacco Use   Smoking status: Former    Packs/day: 0.10    Years: 6.00    Additional pack years: 0.00    Total pack years: 0.60    Types: Cigarettes    Quit date: 08/01/1978    Years since quitting: 44.3   Smokeless tobacco: Never   Tobacco comments:    only smoked 4-6 yrs 1 pack per month  Vaping Use   Vaping Use: Never used  Substance and Sexual Activity   Alcohol use: Yes    Comment: 1 drink per month   Drug use: No   Sexual activity: Not Currently    Birth control/protection: Surgical    Comment: HYSTERECTOMY-1st intercourse 27-Fewer than 5 partners  Other Topics Concern   Not on file  Social History Narrative   Not on file   Social Determinants of Health   Financial Resource  Strain: Not on file  Food Insecurity: Not on file  Transportation Needs: Not on file  Physical Activity: Not on file  Stress: Not on file  Social Connections: Not on file     Family History: The patient's family history includes Alzheimer's disease in her maternal aunt and maternal aunt; Breast cancer in her maternal aunt, maternal aunt, and mother; Cancer in her father, maternal aunt, maternal aunt, and paternal aunt; Hypertension in her mother and sister; Stroke in her sister.  ROS:   Please see the history of present illness.  + chest discomfort All other systems reviewed and are negative.  Labs/Other Studies Reviewed:    The following studies were reviewed today:  Echo 11/15/20 1. Left ventricular ejection fraction, by estimation, is 60 to 65%. The  left ventricle has normal function. The left ventricle has no regional  wall motion abnormalities. Left ventricular diastolic parameters are  indeterminate.   2. Right ventricular systolic function is normal. The right ventricular  size is normal. There is normal pulmonary artery systolic pressure.   3. The mitral valve is normal in structure. Trivial mitral valve  regurgitation. No evidence of mitral stenosis.   4. The aortic valve has an indeterminant number of cusps. Aortic valve  regurgitation is not visualized. No aortic stenosis is present.   5. The inferior vena cava is normal in size with greater than 50%  respiratory variability, suggesting right atrial pressure of 3 mmHg.   Blood pressure monitor 10/19/20 Average Overall BP 135/1mmHg Average awake BP 136/38mmHg Average asleep 133/73mmHg 45% of systolic BPs >135mmHg awake and >137mmHg asleep 0% of systolic BPs >23mmhg awake and > asleep   ETT 08/20/20 Blood pressure demonstrated a hypertensive response to exercise. Exercise time 4 minutes and 31 seconds, 4.6 METS, fair Heart rate able to increase to 121 bpm. Immediately after exercise, there is 2 mm ST  segment depression in the inferior leads as well as V6. This is accentuation of nonspecific ST-T wave changes noted at baseline. Cannot exclude ischemia. Intermediate risk stress test with ST changes noted as above. Cannot exclude the possibility of ischemia.  Cardiac Monitor 06/28/20 Sinus bradycardia, normal sinus rhythm and sinus tachycardia. The average herat rate was 55bpm and ranged from 45 to 127bpm. Nonsustained atrial tachycardia up  to 19 beats at 118bpm. Accelerated junctional rhythm. Rare PAC and PVC. Atrial triplet.  Recent Labs: 10/31/2022: ALT 18; BUN 17; Creatinine, Ser 0.85; Hemoglobin 14.4; Platelets 216; Potassium 4.0; Sodium 137  Recent Lipid Panel No results found for: "CHOL", "TRIG", "HDL", "CHOLHDL", "VLDL", "LDLCALC", "LDLDIRECT"   Risk Assessment/Calculations:           Physical Exam:    VS:  BP (!) 128/59   Pulse 89   Ht 5\' 2"  (1.575 m)   Wt 121 lb 9.6 oz (55.2 kg)   SpO2 99%   BMI 22.24 kg/m     Wt Readings from Last 3 Encounters:  11/09/22 121 lb 9.6 oz (55.2 kg)  11/04/22 120 lb 6.4 oz (54.6 kg)  10/31/22 128 lb (58.1 kg)     GEN:  Well nourished, well developed in no acute distress HEENT: Normal NECK: No JVD; No carotid bruits CARDIAC: RRR, no murmurs, rubs, gallops RESPIRATORY:  Clear to auscultation without rales, wheezing or rhonchi  ABDOMEN: Soft, non-tender, non-distended MUSCULOSKELETAL:  No edema; No deformity. 2+ pedal pulses, equal bilaterally SKIN: Warm and dry NEUROLOGIC:  Alert and oriented x 3 PSYCHIATRIC:  Normal affect   EKG:  EKG is not ordered today.    Diagnoses:    1. Chest pain of uncertain etiology   2. Sinus bradycardia   3. History of pulmonary embolus (PE)   4. Chronic anticoagulation   5. Chronic fatigue and malaise   6. Hyperlipidemia, unspecified hyperlipidemia type    Assessment and Plan:     Chest pain: She has mid-sternal chest discomfort that has been intermittent for years but is occurring more  frequently recently. Has a sense of not being able to take a deep breath at times associated with this.  Recognizes also that she has anxiety that may be contributing. Had no significant coronary artery disease and calcium score of 12/2017. No significant valve disease and normal LV function on echo 10/2020. We will repeat coronary CTA for mapping of coronary anatomy. Will have her take Lopressor 50 mg 2 hours prior to test.   Fatigue: Has a history of fibromyalgia.  Recently read that statin medications can exacerbate fibromyalgia.  She is currently holding rosuvastatin for 2 months to see if her symptoms improve. Normal TSH, CBC 10/05/2022.  Sinus bradycardia: HR initially 89 bpm, slow to 62 bpm during exam.  She asks about past monitor 06/2020 that revealed both sinus bradycardia and sinus tachycardia. I explained the physiology of this. Not currently on AV nodal blocker. Continue to avoid AV nodal blocking agents.   Recurrent DVT/PE: History of positive lupus anticoagulant. Followed by oncology. I reviewed most recetn CTA chest from 10/03/22. Lifetime anticoagulation recommended. Followed by our coumadin clinic. No concerns today.   Hyperlipidemia: LDL 145 on 04/25/2022. Was advised to start rosuvastatin 5 mg by PCP. She is currently holding this to see if symptoms of fibromyalgia improved. Advised that we will further risk stratify with coronary CT, however she has an LDL goal of at least < 100.  Encouraged heart healthy, mostly plant-based diet and 150 minutes of moderate intensity exercise each week.     Disposition: 2-3 months with me  Medication Adjustments/Labs and Tests Ordered: Current medicines are reviewed at length with the patient today.  Concerns regarding medicines are outlined above.  Orders Placed This Encounter  Procedures   CT CORONARY MORPH W/CTA COR W/SCORE W/CA W/CM &/OR WO/CM   Meds ordered this encounter  Medications   metoprolol tartrate (  LOPRESSOR) 50 MG tablet    Sig:  Take one (1) tablet by mouth ( 50 mg) 2 hours prior to CT scan.    Dispense:  1 tablet    Refill:  0    Patient Instructions  Medication Instructions:   Your physician recommends that you continue on your current medications as directed. Please refer to the Current Medication list given to you today.   *If you need a refill on your cardiac medications before your next appointment, please call your pharmacy*   Lab Work:  None ordered.  If you have labs (blood work) drawn today and your tests are completely normal, you will receive your results only by: MyChart Message (if you have MyChart) OR A paper copy in the mail If you have any lab test that is abnormal or we need to change your treatment, we will call you to review the results.   Testing/Procedures:    Your cardiac CT will be scheduled at one of the below locations:   [facility] [address] [place], [place] [address] [phone]   If scheduled at [facility], please arrive at the [facility] and Children's Entrance (Entrance C2) of [facility] 30 minutes prior to test start time. You can use the FREE valet parking offered at entrance C (encouraged to control the heart rate for the test)  Proceed to the [facility] Radiology Department (first floor) to check-in and test prep.  All radiology patients and guests should use entrance C2 at [facility], accessed from [facility], even though the hospital's physical address listed is [address]     Please follow these instructions carefully (unless otherwise directed):   On the Night Before the Test: Be sure to Drink plenty of water. Do not consume any caffeinated/decaffeinated beverages or chocolate 12 hours prior to your test. Do not take any antihistamines 12 hours prior to your test.   On the Day of the Test: Drink plenty of water until 1 hour prior to the test. Do not eat any food 1 hour prior to  test. You may take your regular medications prior to the test.  Take metoprolol (Lopressor)one tablet by mouth ( 50 mg)  two hours prior to test. FEMALES- please wear underwire-free bra if available, avoid dresses & tight clothing      After the Test: Drink plenty of water. After receiving IV contrast, you may experience a mild flushed feeling. This is normal. On occasion, you may experience a mild rash up to 24 hours after the test. This is not dangerous. If this occurs, you can take Benadryl 25 mg and increase your fluid intake. If you experience trouble breathing, this can be serious. If it is severe call 911 IMMEDIATELY. If it is mild, please call our office.  We will call to schedule your test 2-4 weeks out understanding that some insurance companies will need an authorization prior to the service being performed.   For non-scheduling related questions, please contact the cardiac imaging nurse navigator should you have any questions/concerns: Rockwell Alexandria, Cardiac Imaging Nurse Navigator Larey Brick, Cardiac Imaging Nurse Navigator [facility] Direct Office Dial: [phone]   For scheduling needs, including cancellations and rescheduling, please call [place], [phone].    Follow-Up: At [facility], you and your health needs are our priority.  As part of our continuing mission to provide you with exceptional heart care, we have created designated Provider Care  Teams.  These Care Teams include your primary Cardiologist (physician) and Advanced Practice Providers (APPs -  Physician Assistants and Nurse Practitioners) who all work together to provide you with the care you need, when you need it.  We recommend signing up for the patient portal called "MyChart".  Sign up information is provided on this After Visit Summary.  MyChart is used to connect with patients for Virtual Visits (Telemedicine).  Patients are able to view lab/test results,  encounter notes, upcoming appointments, etc.  Non-urgent messages can be sent to your provider as well.   To learn more about what you can do with MyChart, go to [email or web address].    Your next appointment:   3 month(s)  Provider:   Eligha Bridegroom, NP           Signed, Levi Aland, NP  11/09/2022 1:34 PM    [facility]

## 2022-11-09 NOTE — Patient Instructions (Signed)
Medication Instructions:   Your physician recommends that you continue on your current medications as directed. Please refer to the Current Medication list given to you today.   *If you need a refill on your cardiac medications before your next appointment, please call your pharmacy*   Lab Work:  None ordered.  If you have labs (blood work) drawn today and your tests are completely normal, you will receive your results only by: MyChart Message (if you have MyChart) OR A paper copy in the mail If you have any lab test that is abnormal or we need to change your treatment, we will call you to review the results.   Testing/Procedures:    Your cardiac CT will be scheduled at one of the below locations:   [facility] [address] [place], [place] [address] [phone]   If scheduled at [facility], please arrive at the [facility] and [facility] Entrance (Entrance C2) of [facility] 30 minutes prior to test start time. You can use the FREE valet parking offered at entrance C (encouraged to control the heart rate for the test)  Proceed to the [facility] Radiology Department (first floor) to check-in and test prep.  All radiology patients and guests should use entrance C2 at [facility], accessed from [facility], even though the hospital's physical address listed is [address]     Please follow these instructions carefully (unless otherwise directed):   On the Night Before the Test: Be sure to Drink plenty of water. Do not consume any caffeinated/decaffeinated beverages or chocolate 12 hours prior to your test. Do not take any antihistamines 12 hours prior to your test.   On the Day of the Test: Drink plenty of water until 1 hour prior to the test. Do not eat any food 1 hour prior to test. You may take your regular medications prior to the test.  Take metoprolol (Lopressor)one tablet by mouth ( 50 mg)  two  hours prior to test. FEMALES- please wear underwire-free bra if available, avoid dresses & tight clothing      After the Test: Drink plenty of water. After receiving IV contrast, you may experience a mild flushed feeling. This is normal. On occasion, you may experience a mild rash up to 24 hours after the test. This is not dangerous. If this occurs, you can take Benadryl 25 mg and increase your fluid intake. If you experience trouble breathing, this can be serious. If it is severe call 911 IMMEDIATELY. If it is mild, please call our office.  We will call to schedule your test 2-4 weeks out understanding that some insurance companies will need an authorization prior to the service being performed.   For non-scheduling related questions, please contact the cardiac imaging nurse navigator should you have any questions/concerns: Rockwell Alexandria, Cardiac Imaging Nurse Navigator Larey Brick, Cardiac Imaging Nurse Navigator [facility] Direct Office Dial: [phone]   For scheduling needs, including cancellations and rescheduling, please call [place], [phone].    Follow-Up: At [facility], you and your health needs are our priority.  As part of our continuing mission to provide you with exceptional heart care, we have created designated Provider Care Teams.  These Care Teams include your primary Cardiologist (physician) and Advanced Practice Providers (APPs -  Physician Assistants and Nurse Practitioners) who all work together to provide you with the care you need, when you need it.  We recommend signing up  for the patient portal called "MyChart".  Sign up information is provided on this After Visit Summary.  MyChart is used to connect with patients for Virtual Visits (Telemedicine).  Patients are able to view lab/test results, encounter notes, upcoming appointments, etc.  Non-urgent messages can be sent to your provider as well.   To learn more about  what you can do with MyChart, go to [email or web address].    Your next appointment:   3 month(s)  Provider:   Eligha Bridegroom, NP

## 2022-11-11 ENCOUNTER — Other Ambulatory Visit: Payer: Self-pay

## 2022-11-11 ENCOUNTER — Telehealth (HOSPITAL_COMMUNITY): Payer: Self-pay | Admitting: *Deleted

## 2022-11-11 ENCOUNTER — Encounter: Payer: Self-pay | Admitting: Family

## 2022-11-11 ENCOUNTER — Inpatient Hospital Stay: Payer: PPO | Attending: Family | Admitting: Family

## 2022-11-11 ENCOUNTER — Other Ambulatory Visit: Payer: PPO

## 2022-11-11 VITALS — BP 138/62 | HR 54 | Temp 98.2°F | Resp 18 | Ht 62.0 in | Wt 121.9 lb

## 2022-11-11 DIAGNOSIS — I447 Left bundle-branch block, unspecified: Secondary | ICD-10-CM | POA: Insufficient documentation

## 2022-11-11 DIAGNOSIS — D6862 Lupus anticoagulant syndrome: Secondary | ICD-10-CM | POA: Insufficient documentation

## 2022-11-11 DIAGNOSIS — R001 Bradycardia, unspecified: Secondary | ICD-10-CM | POA: Diagnosis not present

## 2022-11-11 DIAGNOSIS — R079 Chest pain, unspecified: Secondary | ICD-10-CM | POA: Diagnosis not present

## 2022-11-11 DIAGNOSIS — R931 Abnormal findings on diagnostic imaging of heart and coronary circulation: Secondary | ICD-10-CM | POA: Insufficient documentation

## 2022-11-11 DIAGNOSIS — I82401 Acute embolism and thrombosis of unspecified deep veins of right lower extremity: Secondary | ICD-10-CM | POA: Diagnosis not present

## 2022-11-11 DIAGNOSIS — Z7901 Long term (current) use of anticoagulants: Secondary | ICD-10-CM | POA: Diagnosis not present

## 2022-11-11 DIAGNOSIS — I82402 Acute embolism and thrombosis of unspecified deep veins of left lower extremity: Secondary | ICD-10-CM | POA: Diagnosis not present

## 2022-11-11 DIAGNOSIS — Z79899 Other long term (current) drug therapy: Secondary | ICD-10-CM | POA: Insufficient documentation

## 2022-11-11 NOTE — Telephone Encounter (Signed)
Reaching out to patient to offer assistance regarding upcoming cardiac imaging study; pt verbalizes understanding of appt date/time, parking situation and where to check in, pre-test NPO status and medications ordered, and verified current allergies; name and call back number provided for further questions should they arise  Larey Brick RN Navigator Cardiac Imaging [facility] [phone] office [phone] cell  Patient hx of bradycardia and was advised to only take 50mg  metoprolol tartrate if HR was greater 65bpm two hours prior to her cardiac CT scan. She is aware to arrive at 3pm.

## 2022-11-11 NOTE — Progress Notes (Signed)
Hematology and Oncology Follow Up Visit  Emma Lopez [id number] July 13, 1946 77 y.o. 11/11/2022   Principle Diagnosis:  Recurrent DVT of the left leg -- probable progression Positive lupus anticoagulant   Past Therapy: Xarelto 20 mg p.o. daily -- d/c on 01/06/2021 Pradaxa 150 mg po BID - start on 01/06/2021 - d/c on 01/25/2021   Current Therapy:        Coumadin  -- dosed by [facility] Clinic -- keep INR 2.5-3.5   Interim History:  Emma Lopez is here today for follow-up. She is doing fairly well. She has been having chest tightness, SOB with any exertion and belching. She has seen her cardiologist and will be having CT of the heart next week on Monday.  She has not noted any blood loss. No abnormal bruising or petechiae.  She states that she experiences anxiety at times.  No fever, chills, n/v, cough, rash, abdominal pain or changes in bowel or bladder habits.  No swelling in her extremities.  She has intermittent tingling in her hands and at times the right arm. She has an appointment with her neurologist for further eval next week.  No falls or syncope.  Appetite and hydration are ok right now. Weight is stable at 121 lbs.   ECOG Performance Status: 1 - Symptomatic but completely ambulatory  Medications:  Allergies as of 11/11/2022       Reactions   Sulfa Antibiotics Rash, Other (See Comments)        Medication List        Accurate as of November 11, 2022 12:50 PM. If you have any questions, ask your nurse or doctor.          ALPRAZolam 0.25 MG tablet Commonly known as: XANAX Take 1 tablet (0.25 mg total) by mouth every 8 (eight) hours as needed for anxiety.   denosumab 60 MG/ML Soln injection Commonly known as: PROLIA Inject 60 mg into the skin every 6 (six) months. Administer in upper arm, thigh, or abdomen   escitalopram 20 MG tablet Commonly known as: LEXAPRO Take 20 mg by mouth daily.   methocarbamol 500 MG tablet Commonly known as: ROBAXIN Take 250 mg by  mouth daily as needed for muscle spasms.   metoprolol tartrate 50 MG tablet Commonly known as: Lopressor Take one (1) tablet by mouth ( 50 mg) 2 hours prior to CT scan.   rOPINIRole 1 MG tablet Commonly known as: REQUIP Take 1 mg by mouth at bedtime.   rosuvastatin 5 MG tablet Commonly known as: CRESTOR Take 5 mg by mouth at bedtime.   warfarin 1 MG tablet Commonly known as: COUMADIN Take as directed by the anticoagulation clinic. If you are unsure how to take this medication, talk to your nurse or doctor. Original instructions: Take 1/2 tablet to 1 tablet by mouth daily or as directed by Anticoagulation Clinic.        Allergies:  Allergies  Allergen Reactions   Sulfa Antibiotics Rash and Other (See Comments)    Past Medical History, Surgical history, Social history, and Family History were reviewed and updated.  Review of Systems: All other 10 point review of systems is negative.   Physical Exam:  vitals were not taken for this visit.   Wt Readings from Last 3 Encounters:  11/09/22 121 lb 9.6 oz (55.2 kg)  11/04/22 120 lb 6.4 oz (54.6 kg)  10/31/22 128 lb (58.1 kg)    Ocular: Sclerae unicteric, pupils equal, round and reactive to light Ear-nose-throat: Oropharynx clear,  dentition fair Lymphatic: No cervical or supraclavicular adenopathy Lungs no rales or rhonchi, good excursion bilaterally Heart regular rate and rhythm, no murmur appreciated Abd soft, nontender, positive bowel sounds MSK no focal spinal tenderness, no joint edema Neuro: non-focal, well-oriented, appropriate affect Breasts: Deferred   Lab Results  Component Value Date   WBC 7.6 10/31/2022   HGB 14.4 10/31/2022   HCT 42.4 10/31/2022   MCV 91.6 10/31/2022   PLT 216 10/31/2022   Lab Results  Component Value Date   FERRITIN 69 07/05/2021   IRON 111 12/04/2019   TIBC 351 12/04/2019   UIBC 240 12/04/2019   IRONPCTSAT 32 12/04/2019   Lab Results  Component Value Date   RBC 4.63  10/31/2022   No results found for: "KPAFRELGTCHN", "LAMBDASER", "KAPLAMBRATIO" No results found for: "IGGSERUM", "IGA", "IGMSERUM" No results found for: "TOTALPROTELP", "ALBUMINELP", "A1GS", "A2GS", "BETS", "BETA2SER", "GAMS", "MSPIKE", "SPEI"   Chemistry      Component Value Date/Time   NA 137 10/31/2022 1310   NA 142 01/03/2018 1601   K 4.0 10/31/2022 1310   CL 106 10/31/2022 1310   CO2 22 10/31/2022 1310   BUN 17 10/31/2022 1310   BUN 20 01/03/2018 1601   CREATININE 0.85 10/31/2022 1310   CREATININE 0.78 06/03/2022 1142      Component Value Date/Time   CALCIUM 8.7 (L) 10/31/2022 1310   ALKPHOS 52 10/31/2022 1310   AST 22 10/31/2022 1310   AST 19 06/03/2022 1142   ALT 18 10/31/2022 1310   ALT 14 06/03/2022 1142   BILITOT 0.5 10/31/2022 1310   BILITOT 0.5 06/03/2022 1142       Impression and Plan: Emma Lopez is a very pleasant 77 yo caucasian female with history of recurrent DVT and positive lupus anticoagulant.  She is now on Coumadin lifelong managed with the Coumadin clinic.  She continues to do well. No questions or concerns at this time.  Follow-up in 6 months.    Eileen Stanford, NP 4/12/202412:50 PM

## 2022-11-14 ENCOUNTER — Ambulatory Visit (HOSPITAL_COMMUNITY)
Admission: RE | Admit: 2022-11-14 | Discharge: 2022-11-14 | Disposition: A | Discharge status: Home / Self Care | Payer: PPO | Source: Ambulatory Visit | Attending: Nurse Practitioner | Admitting: Nurse Practitioner

## 2022-11-14 DIAGNOSIS — R072 Precordial pain: Secondary | ICD-10-CM | POA: Insufficient documentation

## 2022-11-14 DIAGNOSIS — E785 Hyperlipidemia, unspecified: Secondary | ICD-10-CM | POA: Diagnosis not present

## 2022-11-14 DIAGNOSIS — R0789 Other chest pain: Secondary | ICD-10-CM | POA: Insufficient documentation

## 2022-11-14 DIAGNOSIS — R911 Solitary pulmonary nodule: Secondary | ICD-10-CM | POA: Insufficient documentation

## 2022-11-14 DIAGNOSIS — R001 Bradycardia, unspecified: Secondary | ICD-10-CM

## 2022-11-14 DIAGNOSIS — I2510 Atherosclerotic heart disease of native coronary artery without angina pectoris: Secondary | ICD-10-CM

## 2022-11-14 MED ORDER — IOHEXOL 350 MG/ML SOLN
100.0000 mL | Freq: Once | INTRAVENOUS | Status: AC | PRN
  Administered 2022-11-14: 100 mL via INTRAVENOUS

## 2022-11-14 MED ORDER — NITROGLYCERIN 0.4 MG SL SUBL
SUBLINGUAL_TABLET | SUBLINGUAL | Status: AC
  Filled 2022-11-14: qty 2

## 2022-11-14 MED ORDER — NITROGLYCERIN 0.4 MG SL SUBL
0.8000 mg | SUBLINGUAL_TABLET | Freq: Once | SUBLINGUAL | Status: AC
  Administered 2022-11-14: 0.8 mg via SUBLINGUAL

## 2022-11-15 ENCOUNTER — Ambulatory Visit (HOSPITAL_COMMUNITY)
Admission: RE | Admit: 2022-11-15 | Discharge: 2022-11-15 | Disposition: A | Discharge status: Home / Self Care | Payer: PPO | Source: Ambulatory Visit | Attending: Cardiology | Admitting: Cardiology

## 2022-11-15 ENCOUNTER — Other Ambulatory Visit: Payer: Self-pay | Admitting: Cardiology

## 2022-11-15 DIAGNOSIS — R931 Abnormal findings on diagnostic imaging of heart and coronary circulation: Secondary | ICD-10-CM

## 2022-11-15 DIAGNOSIS — I82402 Acute embolism and thrombosis of unspecified deep veins of left lower extremity: Secondary | ICD-10-CM | POA: Diagnosis not present

## 2022-11-16 ENCOUNTER — Telehealth: Payer: Self-pay | Admitting: Neurology

## 2022-11-16 ENCOUNTER — Encounter (HOSPITAL_BASED_OUTPATIENT_CLINIC_OR_DEPARTMENT_OTHER): Payer: Self-pay | Admitting: Cardiology

## 2022-11-16 NOTE — Telephone Encounter (Signed)
New referral by Dr Nadene Rubins for Dyseasthesias in patient with RLS, not PLMs. History of positive ANA, and DVT , PE , chest pain. Last seen in Sleep Clinic 10-2021. Last sleep study 2021. [facility].

## 2022-11-17 ENCOUNTER — Encounter: Payer: Self-pay | Admitting: Neurology

## 2022-11-17 ENCOUNTER — Telehealth: Payer: Self-pay | Admitting: Neurology

## 2022-11-17 ENCOUNTER — Ambulatory Visit: Payer: PPO | Admitting: Neurology

## 2022-11-17 VITALS — BP 143/57 | HR 47 | Ht 62.0 in | Wt 119.0 lb

## 2022-11-17 DIAGNOSIS — R5382 Chronic fatigue, unspecified: Secondary | ICD-10-CM | POA: Diagnosis not present

## 2022-11-17 DIAGNOSIS — R4589 Other symptoms and signs involving emotional state: Secondary | ICD-10-CM

## 2022-11-17 DIAGNOSIS — R5381 Other malaise: Secondary | ICD-10-CM | POA: Diagnosis not present

## 2022-11-17 DIAGNOSIS — G309 Alzheimer's disease, unspecified: Secondary | ICD-10-CM

## 2022-11-17 DIAGNOSIS — G2581 Restless legs syndrome: Secondary | ICD-10-CM

## 2022-11-17 MED ORDER — ROPINIROLE HCL 1 MG PO TABS: 1.0000 mg | ORAL_TABLET | Freq: Every day | ORAL | 2 refills | Status: DC

## 2022-11-17 NOTE — Progress Notes (Addendum)
[facility]  Provider:  Dr Kori Goins Referring Provider: Melida Quitter, MD Primary Care Physician:  Melida Quitter, MD  Chief Complaint  Patient presents with   Numbness    RM 1 alone Pt is well, reports numbness and tingling in upper and  lower extremities for about 6 months. Occasional weakness in fingers  She mentions a fall and head injury prior to symptoms starting.     HPI:  Emma Lopez is a 77 y.o. female and seen here upon referral from Dr. Nadene Rubins for a Consultation/ Evaluation of tingling, numbness in both legs. She reports also high degree of fatigue - not new- not daytime sleepiness , but at the same time an inability to fall asleep when she could nap.  She reports her requip is the only medication that can control her restless limbs, This patient reports onset of nocturnal spells of dyseasthesias over a period of 6 months. Right arm pain, right leg some sciatica. She is wondering about Neuropathy , Fibromyalgia, and she appears anxious. Had DVT  in both legs. Had PE.  Positive for lupus anticoagulant.  She wakes up at night with pressure in the chest, limbs are tingling and burning, and she  feels relief after taking Xanax.       Emma Lopez is a 77 y.o. year old White or Caucasian female patient seen here as a referral on 11/16/2021 from Pt has a low energy level. She has lost her female companion in January 2023 and her sister last year. She is lonely.  11-16-2021 Now her PCP is Dr Nadene Rubins. The  patient is reportedly much more fatigued rather than sleepy.  She has always been a "napper". She states that she used to be able to take a brief nap and feel refreshed. Now Anytime she now lays down her legs act up, she stated her "RLS" keeps he from sleeping or napping.  It can also happen in a recliner.  Her sleep is still fragmented by nocturia, 3 times on average. Her cat also wake her earlier than desired.  Last visit was with Shawnie Dapper, NP on 05-18-2021 and she  advised to start the XL tab , but she was unable to swallow that. She has been working with pcp dr . Nadene Rubins to discuss changing antidepressants ( has been on lexapro) . Now tried Cymbalta in late March. Sprinkles in capsule, she has to open the capsule.  While weaning off lexapro she started to feel better.    Her in lab sleep study from 12-19-2019 showed no apnea; The patient described Restless legs as the main problem to initiate sleep, but Requip  seems to prevent translation into PLMs.  There was still a prolonged sleep latency noted and many limb movements during wakefulness.  I had felt then that intervention is not needed.     Chief concern according to patient :" My RLS/ PLMs may cause me to not rest well, I am tired all day. "   I have the pleasure of seeing Emma Lopez today, a right-handed Caucasian female with an e unexplained  chronic  fatigue disorder.     She  has a past medical history of Abnormal EKG, Anxiety, Arthritis, Bradycardia (12/14/2017), CIN III (cervical intraepithelial neoplasia III) (2000), Complication of anesthesia, PLMs,  Nocturia, fatigue- Depression, DES exposure in utero, DVT (deep venous thrombosis) (HCC) (01/2009), Dyspnea on exertion (10/2017), Elevated triglycerides with high cholesterol, Endometriosis,GERD (gastroesophageal reflux disease), History of colon polyps, History of  hiatal hernia, IBS (irritable bowel syndrome) (2015), Multinodular goiter, Osteoporosis (12/2017), PE (pulmonary embolism- 2010- after I had last seen her), PONV (postoperative nausea and vomiting), Restless legs syndrome,PLM sleep disorder, Thyroid disease, and Vitamin D deficiency..  The patient had the first and last sleep study in the year 2003  with a result of PLM disorder - at [facility].   Sleep relevant medical history: Nocturia/ 3-4 ,DVT and PE in 2010 ,   LBBB diagnosed last year, Dr Carolanne Grumbling- PVC on EKG, bradycardia.     Review of Systems: Out of a complete 14 system review, the  patient complains of only the following symptoms, and all other reviewed systems are negative.  Total =1/ 24 points - Because legs don't let me -   FSS endorsed at  36 / 63 points.   Social History   Socioeconomic History   Marital status: Single    Spouse name: Not on file   Number of children: Not on file   Years of education: Not on file   Highest education level: Not on file  Occupational History   Not on file  Tobacco Use   Smoking status: Former    Packs/day: 0.10    Years: 6.00    Additional pack years: 0.00    Total pack years: 0.60    Types: Cigarettes    Quit date: 08/01/1978    Years since quitting: 44.3   Smokeless tobacco: Never   Tobacco comments:    only smoked 4-6 yrs 1 pack per month  Vaping Use   Vaping Use: Never used  Substance and Sexual Activity   Alcohol use: Yes    Comment: 1 drink per month   Drug use: No   Sexual activity: Not Currently    Birth control/protection: Surgical    Comment: HYSTERECTOMY-1st intercourse 27-Fewer than 5 partners  Other Topics Concern   Not on file  Social History Narrative   Not on file   Social Determinants of Health   Financial Resource Strain: Not on file  Food Insecurity: Not on file  Transportation Needs: Not on file  Physical Activity: Not on file  Stress: Not on file  Social Connections: Not on file  Intimate Partner Violence: Not on file    Family History  Problem Relation Age of Onset   Hypertension Mother    Breast cancer Mother        53's   Cancer Father        COLON   Hypertension Sister    Stroke Sister    Breast cancer Maternal Aunt        Age 28's   Cancer Maternal Aunt        Melanoma   Alzheimer's disease Maternal Aunt    Cancer Paternal Aunt        OVARIAN and COLON   Breast cancer Maternal Aunt        70's   Cancer Maternal Aunt        Colon CA   Alzheimer's disease Maternal Aunt     Past Medical History:  Diagnosis Date   Abnormal EKG    Anxiety    Arthritis    oa    Bradycardia 12/14/2017   CIN III (cervical intraepithelial neoplasia III) 2000   Complication of anesthesia    did well last 2 times with procedures   Depression    DES exposure in utero    DVT (deep venous thrombosis) 01/2009   LEFT LEG   DVT (  deep venous thrombosis) 10/30/2020   Dyspnea on exertion 10/2017   Elevated triglycerides with high cholesterol    Endometriosis    Fibromyalgia    GERD (gastroesophageal reflux disease)    History of colon polyps    History of hiatal hernia    told by some md has, some say not   IBS (irritable bowel syndrome) 2015   Left bundle branch block    intermittent   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   Lupus anticoagulant disorder    Multinodular goiter    Osteoporosis 12/2017   T score -3.2 stable from prior study   PE (pulmonary embolism)    Pneumonia 03/01/2021   PONV (postoperative nausea and vomiting)    Restless legs syndrome    Sinus bradycardia    Sleep disorder    Thyroid disease    HYPERTHYROIDISM   Vitamin D deficiency     Past Surgical History:  Procedure Laterality Date   ABDOMINAL HYSTERECTOMY  2001   TAH, partial   APPENDECTOMY  1978   COLONOSCOPY WITH PROPOFOL N/A 12/07/2015   Procedure: COLONOSCOPY WITH PROPOFOL;  Surgeon: Charolett Bumpers, MD;  Location: [facility];  Service: Endoscopy;  Laterality: N/A;   COLONOSCOPY WITH PROPOFOL N/A 01/11/2022   Procedure: COLONOSCOPY WITH PROPOFOL;  Surgeon: Kerin Salen, MD;  Location: [facility];  Service: Gastroenterology;  Laterality: N/A;   colonscopy  7 yrs ago   other in past   ESOPHAGOGASTRODUODENOSCOPY (EGD) WITH PROPOFOL N/A 12/07/2015   Procedure: ESOPHAGOGASTRODUODENOSCOPY (EGD) WITH PROPOFOL;  Surgeon: Charolett Bumpers, MD;  Location: [facility];  Service: Endoscopy;  Laterality: N/A;   ESOPHAGOGASTRODUODENOSCOPY ENDOSCOPY  yrs ago   FACIAL COSMETIC SURGERY     GYNECOLOGIC CRYOSURGERY     HEMOSTASIS CLIP PLACEMENT  01/11/2022    Procedure: HEMOSTASIS CLIP PLACEMENT;  Surgeon: Kerin Salen, MD;  Location: [facility];  Service: Gastroenterology;;   HOT HEMOSTASIS N/A 01/11/2022   Procedure: HOT HEMOSTASIS (ARGON PLASMA COAGULATION/BICAP);  Surgeon: Kerin Salen, MD;  Location: [facility];  Service: Gastroenterology;  Laterality: N/A;   MYOMECTOMY     POLYPECTOMY  01/11/2022   Procedure: POLYPECTOMY;  Surgeon: Kerin Salen, MD;  Location: [facility];  Service: Gastroenterology;;   REPLACEMENT TOTAL JOINT WRIST W/ PROSTHETIC IMPLANT Right 08/01/2021   TONSILLECTOMY      Current Outpatient Medications  Medication Sig Dispense Refill   ALPRAZolam (XANAX) 0.25 MG tablet Take 1 tablet (0.25 mg total) by mouth every 8 (eight) hours as needed for anxiety. 30 tablet 0   denosumab (PROLIA) 60 MG/ML SOLN injection Inject 60 mg into the skin every 6 (six) months. Administer in upper arm, thigh, or abdomen 180 mL 2   escitalopram (LEXAPRO) 20 MG tablet Take 20 mg by mouth daily.     methocarbamol (ROBAXIN) 500 MG tablet Take 250 mg by mouth daily as needed for muscle spasms.     rOPINIRole (REQUIP) 1 MG tablet Take 1 mg by mouth at bedtime.     rosuvastatin (CRESTOR) 10 MG tablet Take 10 mg by mouth at bedtime.     warfarin (COUMADIN) 1 MG tablet Take 1/2 tablet to 1 tablet by mouth daily or as directed by Anticoagulation Clinic. 90 tablet 1   Current Facility-Administered Medications  Medication Dose Route Frequency Provider Last Rate Last Admin   denosumab (PROLIA) injection 60 mg  60 mg Subcutaneous Once Patton Salles, MD        Allergies as of  11/17/2022 - Review Complete 11/17/2022  Allergen Reaction Noted   Sulfa antibiotics Rash and Other (See Comments) 01/04/2011    Vitals: BP (!) 143/57   Pulse (!) 47   Ht 5\' 2"  (1.575 m)   Wt 119 lb (54 kg)   BMI 21.77 kg/m  Last Weight:  Wt Readings from Last 1 Encounters:  11/17/22 119 lb (54 kg)   Last Height:   Ht Readings from Last 1 Encounters:   11/17/22 5\' 2"  (1.575 m)   Last BMI: @LASTBMI  Physical exam:  General: The patient is awake, alert and appears not in acute distress. The patient is well groomed. Head: Normocephalic, atraumatic. Neck is supple. Mallampati  3,  neck circumference:14 inches . Nasal airflow  patent.  Retrognathia is mildly present  Dental status: native teeth  Cardiovascular:  Regular rate and cardiac rhythm by pulse,  without distended neck veins. Respiratory: Lungs are clear to auscultation.  Skin:  Without evidence of ankle edema, or rash. Trunk: The patient's posture is erect.   Neurologic exam : The patient is awake and alert, oriented to place and time.  well groomed.  Memory subjective described as impaired over the last 12 months-  anxious.  Attention span & concentration ability appears normal.  Speech is fluent,  without  dysarthria, dysphonia - she feels she struggles for words, and noted her sentence structure was off-  Eloquent.  Mood and affect are appropriate.   Cranial nerves: no loss of smell or taste reported - Pupils are equal and briskly reactive to light. Funduscopic exam deferred.  Extraocular movements in vertical and horizontal planes were intact and without nystagmus. No Diplopia.Visual fields by finger perimetry are intact. Hearing was intact to soft voice and finger rubbing.   Facial sensation intact   Facial motor strength is symmetric and tongue and uvula move midline.  Neck ROM : rotation, tilt and flexion extension were normal for age and shoulder shrug was symmetrical.    Motor exam:  Symmetric bulk, tone and ROM.   Normal tone without cog- wheeling, symmetric grip strength .   Sensory:  Fine touch, pinprick and vibration were tested - she reports tingling in her fingertips and thumbs - all the time.  Proprioception tested in the upper extremities was normal.   Coordination: Rapid alternating movements in the fingers/hands were of normal speed.  The Finger-to-nose  maneuver was intact without evidence of ataxia, dysmetria or tremor.   Gait and station: Patient could rise unassisted from a seated position, walked without assistive device.  Stance is of normal width/ base .  Toe and heel walk were deferred.  Deep tendon reflexes: in the  upper and lower extremities are symmetric and 1 plus.  Babinski response was deferred .    Assessment: Total time for face to face interview and examination, for review of  images and laboratory testing, neurophysiology testing and pre-existing records, including out-of -network , was 45 minutes. Assessment is as follows here:  My physical neurological exam was unchanged in comparison to 2023, and her symptoms would still fit into the spectrum of previously evaluated:  0) she uses xanax for anxiety -  has chest pressure,  yawning, the feeling of poor air intake, but gets relief from Xanax - I believe this is  ANXIETY. I do not prescribe benzodiazepine for her. Counseling for anxiety , anhedonia would be best.   1)  RLS treated with Requip, could not obtain XR form and is back to IR,  one hour  before bedtime.  She feels she sleeps well, but has fatigue in AM. I suggested to break the dose up in two.  I would not look for alternative at this time.    2) new is a concern of possible delayed word finding ( subjectively).  She is prediabetic , she has Lupus anticoagulant and had DVT, PE. She raises a question of ADD- I cannot test for that here.  Question of vascular or psychological origin. MCI  testing  would have been advised   3) Cymbalta provided by Dr  Nadene Rubins . She had to wean off Lexapro to switch to Cymbalta, and felt better on 10 mg than 20 mg.  Supposed to helps with Fibromyalgia and neuropathy, and sciatica.  She is mainly NOT IN PAIN but fatigued.  She is now back on Lexapro 10 mg and felt more depressed again,  increased back to 20 mg.  This was just completed last month. She reports yawning and this can be a symptom/  side effect  of SSRI increase.    Plan:   1)  refilled Requip/ 2)  referral to neuro-psychological testing for subjective MCI, and she believes she has ADD, I think this is depression and anxiety.   MRI brain ordered , to be compared to 09/2020 study      PS : addendum Chronically anticoagulated patient, recent DVT, edema-  all this may  make NCV and EMG impossible.  I sent this test request to my colleagues to review.  Porfirio Mylar Jarriel Papillion, MD   11-17-2022

## 2022-11-17 NOTE — Telephone Encounter (Signed)
[employer] NPR sent to GI [phone] 

## 2022-11-18 ENCOUNTER — Encounter: Payer: Self-pay | Admitting: Psychology

## 2022-11-21 LAB — ATN PROFILE

## 2022-11-22 LAB — ATN PROFILE
A -- Beta-amyloid 42/40 Ratio: 0.112 (ref >0.102)
Beta-amyloid 40: 188.33 pg/mL
N -- NfL, Plasma: 3.95 pg/mL (ref 0.00–7.64)
T -- p-tau181: 1.03 pg/mL — ABNORMAL HIGH (ref 0.00–0.97)

## 2022-12-06 ENCOUNTER — Ambulatory Visit
Admission: RE | Admit: 2022-12-06 | Discharge: 2022-12-06 | Disposition: A | Discharge status: Home / Self Care | Payer: PPO | Source: Ambulatory Visit | Attending: Neurology | Admitting: Neurology

## 2022-12-06 DIAGNOSIS — R4589 Other symptoms and signs involving emotional state: Secondary | ICD-10-CM

## 2022-12-06 DIAGNOSIS — G309 Alzheimer's disease, unspecified: Secondary | ICD-10-CM | POA: Diagnosis not present

## 2022-12-06 DIAGNOSIS — R5381 Other malaise: Secondary | ICD-10-CM

## 2022-12-06 DIAGNOSIS — R5382 Chronic fatigue, unspecified: Secondary | ICD-10-CM | POA: Diagnosis not present

## 2022-12-06 DIAGNOSIS — G2581 Restless legs syndrome: Secondary | ICD-10-CM | POA: Diagnosis not present

## 2022-12-07 DIAGNOSIS — D225 Melanocytic nevi of trunk: Secondary | ICD-10-CM | POA: Diagnosis not present

## 2022-12-07 DIAGNOSIS — L720 Epidermal cyst: Secondary | ICD-10-CM | POA: Diagnosis not present

## 2022-12-07 DIAGNOSIS — L718 Other rosacea: Secondary | ICD-10-CM | POA: Diagnosis not present

## 2022-12-07 DIAGNOSIS — L821 Other seborrheic keratosis: Secondary | ICD-10-CM | POA: Diagnosis not present

## 2022-12-07 DIAGNOSIS — D2371 Other benign neoplasm of skin of right lower limb, including hip: Secondary | ICD-10-CM | POA: Diagnosis not present

## 2022-12-08 ENCOUNTER — Other Ambulatory Visit: Payer: Self-pay | Admitting: *Deleted

## 2022-12-08 ENCOUNTER — Encounter: Payer: Self-pay | Admitting: Neurology

## 2022-12-08 DIAGNOSIS — G2581 Restless legs syndrome: Secondary | ICD-10-CM

## 2022-12-08 DIAGNOSIS — I2782 Chronic pulmonary embolism: Secondary | ICD-10-CM

## 2022-12-08 DIAGNOSIS — R5381 Other malaise: Secondary | ICD-10-CM

## 2022-12-08 DIAGNOSIS — I82402 Acute embolism and thrombosis of unspecified deep veins of left lower extremity: Secondary | ICD-10-CM

## 2022-12-08 MED ORDER — ATORVASTATIN CALCIUM 20 MG PO TABS: 20.0000 mg | ORAL_TABLET | Freq: Every day | ORAL | 3 refills | Status: DC

## 2022-12-12 NOTE — Addendum Note (Signed)
Addended by: Melvyn Novas on: 12/12/2022 06:25 PM   Modules accepted: Orders

## 2022-12-13 NOTE — Telephone Encounter (Signed)
Please review below notes and NCV/EMG note from Dr. Vickey Huger and let me know if I can schedule pt for either of you. Thank you!   Comments  Toni/ Yijun: This patient may have edema. She is chronically anticoagulated.  Please review if you would consider doing this test at all or perhaps in upper extremities ?? Porfirio Mylar        Scheduling Instructions  Patient would like an explanation for numbness, tingling- she has been treated for RLS, had DVTs,  does she have neuropathy?.  Cc dr Nadene Rubins.

## 2022-12-14 ENCOUNTER — Ambulatory Visit: Payer: PPO | Admitting: Physician Assistant

## 2022-12-15 ENCOUNTER — Other Ambulatory Visit: Payer: Self-pay | Admitting: Neurology

## 2022-12-15 DIAGNOSIS — M79604 Pain in right leg: Secondary | ICD-10-CM

## 2022-12-15 DIAGNOSIS — R5383 Other fatigue: Secondary | ICD-10-CM | POA: Diagnosis not present

## 2022-12-15 DIAGNOSIS — I82403 Acute embolism and thrombosis of unspecified deep veins of lower extremity, bilateral: Secondary | ICD-10-CM

## 2022-12-15 DIAGNOSIS — R7303 Prediabetes: Secondary | ICD-10-CM | POA: Diagnosis not present

## 2022-12-15 DIAGNOSIS — M797 Fibromyalgia: Secondary | ICD-10-CM

## 2022-12-15 DIAGNOSIS — G2581 Restless legs syndrome: Secondary | ICD-10-CM

## 2022-12-15 DIAGNOSIS — F419 Anxiety disorder, unspecified: Secondary | ICD-10-CM | POA: Diagnosis not present

## 2022-12-15 DIAGNOSIS — F339 Major depressive disorder, recurrent, unspecified: Secondary | ICD-10-CM | POA: Diagnosis not present

## 2022-12-15 NOTE — Progress Notes (Signed)
I have added a neuropathy laboratory panel-  Patient will be asked to come in for testing.

## 2022-12-16 ENCOUNTER — Ambulatory Visit: Payer: PPO | Attending: Cardiology

## 2022-12-16 ENCOUNTER — Other Ambulatory Visit: Payer: Self-pay | Admitting: Neurology

## 2022-12-16 DIAGNOSIS — I82401 Acute embolism and thrombosis of unspecified deep veins of right lower extremity: Secondary | ICD-10-CM

## 2022-12-16 DIAGNOSIS — Z5181 Encounter for therapeutic drug level monitoring: Secondary | ICD-10-CM

## 2022-12-16 LAB — POCT INR: INR: 3 (ref 2.0–3.0)

## 2022-12-16 NOTE — Telephone Encounter (Signed)
See message of patient in mychart from yesterday, she is ready to go, no edema.   Dr Ahern/ Dr Terrace Arabia

## 2022-12-16 NOTE — Patient Instructions (Signed)
Continue taking Warfarin 1 tablet daily except for 1/2 tablet on Sundays, Tuesdays, and Thursdays.  Recheck INR 6 weeks.   Coumadin Clinic [phone] 

## 2022-12-16 NOTE — Progress Notes (Signed)
Please arrange for NCS/ EMG with Dr Lucia Gaskins.  The patient was cleared by PCP> no oedema at ankle or foot.

## 2022-12-18 ENCOUNTER — Other Ambulatory Visit: Payer: Self-pay | Admitting: Neurology

## 2022-12-18 DIAGNOSIS — G629 Polyneuropathy, unspecified: Secondary | ICD-10-CM

## 2022-12-19 NOTE — Telephone Encounter (Signed)
Pt scheduled for NCV/EMG with Dr. Lucia Gaskins for 03/09/23 at 9:30am and appt was added to wait list

## 2023-01-05 ENCOUNTER — Encounter: Payer: Self-pay | Admitting: Obstetrics and Gynecology

## 2023-01-09 ENCOUNTER — Encounter: Payer: Self-pay | Admitting: Obstetrics and Gynecology

## 2023-01-09 ENCOUNTER — Ambulatory Visit: Payer: PPO | Admitting: Obstetrics and Gynecology

## 2023-01-09 VITALS — BP 124/82 | HR 50 | Ht 62.0 in | Wt 122.0 lb

## 2023-01-09 DIAGNOSIS — R5381 Other malaise: Secondary | ICD-10-CM

## 2023-01-09 DIAGNOSIS — R5383 Other fatigue: Secondary | ICD-10-CM | POA: Diagnosis not present

## 2023-01-09 NOTE — Progress Notes (Signed)
GYNECOLOGY  VISIT   HPI: 77 y.o.   Single  Caucasian  female   G0P0 with No LMP recorded. Patient has had a hysterectomy.   here for   fatigue. Pt noticed fatigue increasing 3-4 months ago. In September, pt began statins for high cholesterol and feels this is the cause.  She has an appointment with the Pharm-D at her PCP office in July.    States she has had panic attacks with chest pain and has gone to the ER twice. Saw NP at Dr. Norris Cross cardiology office.  When Crestor was increased to 10 mg, she felt wiped out.  Now she is on Lipitor 10 mg instead.   She is worried about cancer as the cause for the fatigue. CA125 3 on 09/28/22. Last pelvic US 09/24/22 in office showed normal ovaries.   She has dry sweats and mild headaches also. She is concerned about exposures in her home that could be causing her symptoms.  Has a cat that is indoor and outdoor.   Seen by Dr. Vickey Huger for chronic fatigue and malaise on 11/17/22.  Sleep study showed no apnea. Brain MRI 12/06/22 showed moderate generalized atrophy and no acute findings.   Her PCP has prescribed Lexapro and Cymbalta. Currently on Lexapro.  Dx of fibromyalgia and restless leg syndrome.   Normal TFTs in March with Dr. Evlyn Kanner.   Patient has made an appointment with [facility].  She wonders if she had adrenal problems.  GYNECOLOGIC HISTORY: No LMP recorded. Patient has had a hysterectomy. Contraception:  hyst Menopausal hormone therapy:  n/a Last mammogram:  01/25/22 Breast Density Category A, BI-RADS CATEGORY 1 Neg  Last pap smear:  09/28/22 neg: HR HPV neg,  09/27/21 benign glandular cells of the vagina: HR HPV neg: biopsy showed benign fibroepithelial polyp, 08/19/20 neg         OB History     Gravida  0   Para  0   Term      Preterm      AB      Living         SAB      IAB      Ectopic      Multiple      Live Births                 Patient Active Problem List   Diagnosis Date Noted   Atrial  tachycardia 11/16/2021   Chronic fatigue and malaise 11/16/2021   Encounter for therapeutic drug monitoring 02/02/2021   Acute deep vein thrombosis (DVT) of right lower extremity, unspecified vein (HCC) 01/25/2021   Aortic atherosclerosis (HCC) 11/17/2020   Acute DVT (deep venous thrombosis) (HCC) 11/13/2020   Depression with anxiety 11/13/2020   RLS (restless legs syndrome) 01/07/2020   Insomnia due to medical condition 01/07/2020   Nocturia more than twice per night 01/07/2020   Bradycardia 12/14/2017   Abnormal EKG    GERD (gastroesophageal reflux disease)    Dyspnea on exertion 10/30/2017   Family history of ovarian cancer 04/17/2017   Family history of breast cancer 04/17/2017   Family history of colon cancer 04/17/2017   Family history of pancreatic cancer 04/17/2017   Family history of melanoma 04/17/2017   Genetic testing 04/17/2017   CIN III (cervical intraepithelial neoplasia III)    Arthritis    Fibromyalgia    Chronic pulmonary embolism (HCC)    Thyroid disease    Osteoporosis    DVT (deep venous thrombosis) (HCC)  04/01/2004    Past Medical History:  Diagnosis Date   Abnormal EKG    Anxiety    Arthritis    oa   Bradycardia 12/14/2017   CIN III (cervical intraepithelial neoplasia III) 2000   Complication of anesthesia    did well last 2 times with procedures   Depression    DES exposure in utero    DVT (deep venous thrombosis) (HCC) 01/2009   LEFT LEG   DVT (deep venous thrombosis) (HCC) 10/30/2020   Dyspnea on exertion 10/2017   Elevated triglycerides with high cholesterol    Endometriosis    Fibromyalgia    GERD (gastroesophageal reflux disease)    History of colon polyps    History of hiatal hernia    told by some md has, some say not   IBS (irritable bowel syndrome) 2015   Left bundle branch block    intermittent   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   Lupus anticoagulant disorder (HCC)    Multinodular goiter     Osteoporosis 12/2017   T score -3.2 stable from prior study   PE (pulmonary embolism)    Pneumonia 03/01/2021   PONV (postoperative nausea and vomiting)    Restless legs syndrome    Sinus bradycardia    Sleep disorder    Thyroid disease    HYPERTHYROIDISM   Vitamin D deficiency     Past Surgical History:  Procedure Laterality Date   ABDOMINAL HYSTERECTOMY  2001   TAH, partial   APPENDECTOMY  1978   COLONOSCOPY WITH PROPOFOL N/A 12/07/2015   Procedure: COLONOSCOPY WITH PROPOFOL;  Surgeon: Charolett Bumpers, MD;  Location: [facility];  Service: Endoscopy;  Laterality: N/A;   COLONOSCOPY WITH PROPOFOL N/A 01/11/2022   Procedure: COLONOSCOPY WITH PROPOFOL;  Surgeon: Kerin Salen, MD;  Location: [facility];  Service: Gastroenterology;  Laterality: N/A;   colonscopy  7 yrs ago   other in past   ESOPHAGOGASTRODUODENOSCOPY (EGD) WITH PROPOFOL N/A 12/07/2015   Procedure: ESOPHAGOGASTRODUODENOSCOPY (EGD) WITH PROPOFOL;  Surgeon: Charolett Bumpers, MD;  Location: [facility];  Service: Endoscopy;  Laterality: N/A;   ESOPHAGOGASTRODUODENOSCOPY ENDOSCOPY  yrs ago   FACIAL COSMETIC SURGERY     GYNECOLOGIC CRYOSURGERY     HEMOSTASIS CLIP PLACEMENT  01/11/2022   Procedure: HEMOSTASIS CLIP PLACEMENT;  Surgeon: Kerin Salen, MD;  Location: [facility];  Service: Gastroenterology;;   HOT HEMOSTASIS N/A 01/11/2022   Procedure: HOT HEMOSTASIS (ARGON PLASMA COAGULATION/BICAP);  Surgeon: Kerin Salen, MD;  Location: [facility];  Service: Gastroenterology;  Laterality: N/A;   MYOMECTOMY     POLYPECTOMY  01/11/2022   Procedure: POLYPECTOMY;  Surgeon: Kerin Salen, MD;  Location: [facility];  Service: Gastroenterology;;   REPLACEMENT TOTAL JOINT WRIST W/ PROSTHETIC IMPLANT Right 08/01/2021   TONSILLECTOMY      Current Outpatient Medications  Medication Sig Dispense Refill   ALPRAZolam (XANAX) 0.25 MG tablet Take 1 tablet (0.25 mg total) by mouth every 8 (eight) hours as needed for anxiety. 30 tablet  0   atorvastatin (LIPITOR) 20 MG tablet Take 1 tablet (20 mg total) by mouth daily. 30 tablet 3   denosumab (PROLIA) 60 MG/ML SOLN injection Inject 60 mg into the skin every 6 (six) months. Administer in upper arm, thigh, or abdomen 180 mL 2   escitalopram (LEXAPRO) 20 MG tablet Take 20 mg by mouth daily.     methocarbamol (ROBAXIN) 500 MG tablet Take 250 mg by mouth daily as needed for muscle spasms.  rOPINIRole (REQUIP) 1 MG tablet Take 1 tablet (1 mg total) by mouth at bedtime. 90 tablet 2   warfarin (COUMADIN) 1 MG tablet Take 1/2 tablet to 1 tablet by mouth daily or as directed by Anticoagulation Clinic. 90 tablet 1   Current Facility-Administered Medications  Medication Dose Route Frequency Provider Last Rate Last Admin   denosumab (PROLIA) injection 60 mg  60 mg Subcutaneous Once Patton Salles, MD         ALLERGIES: Sulfa antibiotics  Family History  Problem Relation Age of Onset   Hypertension Mother    Breast cancer Mother        7's   Cancer Father        COLON   Hypertension Sister    Stroke Sister    Breast cancer Maternal Aunt        Age 60's   Cancer Maternal Aunt        Melanoma   Alzheimer's disease Maternal Aunt    Cancer Paternal Aunt        OVARIAN and COLON   Breast cancer Maternal Aunt        70's   Cancer Maternal Aunt        Colon CA   Alzheimer's disease Maternal Aunt     Social History   Socioeconomic History   Marital status: Single    Spouse name: Not on file   Number of children: Not on file   Years of education: Not on file   Highest education level: Not on file  Occupational History   Not on file  Tobacco Use   Smoking status: Former    Packs/day: 0.10    Years: 6.00    Additional pack years: 0.00    Total pack years: 0.60    Types: Cigarettes    Quit date: 08/01/1978    Years since quitting: 44.4   Smokeless tobacco: Never   Tobacco comments:    only smoked 4-6 yrs 1 pack per month  Vaping Use   Vaping Use:  Never used  Substance and Sexual Activity   Alcohol use: Yes    Comment: 1 drink per month   Drug use: No   Sexual activity: Not Currently    Birth control/protection: Surgical    Comment: HYSTERECTOMY-1st intercourse 27-Fewer than 5 partners  Other Topics Concern   Not on file  Social History Narrative   Not on file   Social Determinants of Health   Financial Resource Strain: Not on file  Food Insecurity: Not on file  Transportation Needs: Not on file  Physical Activity: Not on file  Stress: Not on file  Social Connections: Not on file  Intimate Partner Violence: Not on file    Review of Systems  See HPI.  PHYSICAL EXAMINATION:    BP 124/82 (BP Location: Right Arm, Patient Position: Sitting, Cuff Size: Normal)   Pulse (!) 50   Ht 5\' 2"  (1.575 m)   Wt 122 lb (55.3 kg)   SpO2 97%   BMI 22.31 kg/m     General appearance: alert, cooperative and appears stated age  ASSESSMENT  Fatigue.  Malaise. Fibromyalgia.  Hx depression and anxiety. Hx DVT and PE.  PLAN  Reassurance given regarding her recent normal CA125 and pelvic US.  We reviewed signs and symptoms of ovarian cancer.  Return to PCP office for further evaluation of her fatigue with review of medications, consideration of exposure to tick born disease, toxoplasmosis, and other concerns.  We discussed potential referral from her PCP to psychiatry for medication to treat depression.  I discussed [facility] as an option for evaluation of hormones, and that I do not recommend HRT for her based on her age and her medical history.   She may choose to do the consultation but still keep her appointments with her regular medical team.  Questions invited and answered. Fu here prn.    50 min total time was spent for this patient encounter, including preparation, face-to-face counseling with the patient, coordination of care, and documentation of the encounter.

## 2023-01-11 DIAGNOSIS — M810 Age-related osteoporosis without current pathological fracture: Secondary | ICD-10-CM | POA: Diagnosis not present

## 2023-01-11 DIAGNOSIS — E785 Hyperlipidemia, unspecified: Secondary | ICD-10-CM | POA: Diagnosis not present

## 2023-01-11 DIAGNOSIS — F339 Major depressive disorder, recurrent, unspecified: Secondary | ICD-10-CM | POA: Diagnosis not present

## 2023-01-24 NOTE — Progress Notes (Deleted)
Cardiology Office Note:  .   Date:  01/24/2023  ID:  Emma Lopez, DOB May 25, 1946, MRN [id number] PCP: Melida Quitter, MD  [facility] Providers Cardiologist:  Armanda Magic, MD { Click to update primary MD,subspecialty MD or APP then REFRESH:1}   History of Present Illness: .   Emma Lopez is a 77 y.o. female   with a hx of recurrent DVT/PE with positive lupus anticoagulant (followed by Dr. Myna Hidalgo), HLD, sinus bradycardia, GERD, intermittent LBBB, hyperthyroidism and anxiety.   Patient last seen in our office 10/2022 complaining of chest pain. Coronary CTA mod stenosis in LAD, calcium score 1  FFR no significant stenosis.  ROS: ***  Studies Reviewed: Marland Kitchen           Prior CV Studies: {Select studies to display:26339}   Coronary CTA 11/14/22 IMPRESSION: 1. Moderate stenosis, CADRADS = 3. Ostial LAD with minimal plaque but visual 50-69% stenosis. CT FFR will be performed and reported separately. Maximal stenosis 1-24% in LCX and RCA.   2. Coronary calcium score of 1. This was 29th percentile for age-, sex-, and race- matched controls.   3. Total plaque volume 12 mm3 which is <10th percentile for age- and sex- matched controls (calcified plaque 0 mm3; noncalcified plaque 12 mm3). TPV is mild.   4. Normal coronary origin with right dominance.   INTERPRETATION:   CAD-RADS 3: Moderate stenosis (50-69%). Consider symptom-guided anti-ischemic pharmacotherapy as well as risk factor modification per guideline directed care. Additional analysis with CT FFR will be submitted.   Electronically Signed: By: Jodelle Red M.D. On: 11/15/2022 07:51 1. Left Main:  No significant stenosis. FFR = 0.98   2. LAD: No significant stenosis. Proximal FFR = 0.89, Mid FFR = 0.84, Distal FFR = 0.80 3. LCX: No significant stenosis. Proximal FFR = 0.96, Distal FFR = 0.90 4. RCA: No significant stenosis. Proximal FFR = 0.99, Mid FFR = 0.94, Distal FFR = 0.93   IMPRESSION: 1.  CT FFR analysis did not show any significant stenosis. There is visual stenosis at ostial LAD, but FFR is not significant.     Electronically Signed   By: Jodelle Red M.D.   On: 11/15/2022 07:53       Coronary CTA 12/2017 normal with calcium score 0 without significant CAD.  Event monitor 06/2020 showed average HR 55, range 45 to 127 bpm, nonsustained atrial tachycardia max 19 beats, accelerated junctional rhythm, rare PAC/PVC, atrial triplet.  ETT was performed to assess for chronotropic incompetence with HR response to 148 bpm, 4.6 METS, hypertensive response with ST-TW changes after exercise built intermediate risk in the context of baseline EKG abnormalities (appears she had LBBB at that time>> previously told it only appears intermittently).  She had recently also had dobutamine stress at an outside facility 06/2020 that showed no evidence of ischemia so Dr. Mayford Knife felt that the ETT was false positive. F/u ambulatory BP monitor 09/2020 showed average BP 135/55.  2D echo 10/2020 EF 60 to 65%, normal RV without significant abnormalities.  Risk Assessment/Calculations:   {Does this patient have ATRIAL FIBRILLATION?:[id number]} No BP recorded.  {Refresh Note OR Click here to enter BP  :1}***       Physical Exam:   VS:  There were no vitals taken for this visit.   Wt Readings from Last 3 Encounters:  01/09/23 122 lb (55.3 kg)  11/17/22 119 lb (54 kg)  11/11/22 121 lb 14.4 oz (55.3 kg)    GEN: Well nourished, well  developed in no acute distress NECK: No JVD; No carotid bruits CARDIAC: ***RRR, no murmurs, rubs, gallops RESPIRATORY:  Clear to auscultation without rales, wheezing or rhonchi  ABDOMEN: Soft, non-tender, non-distended EXTREMITIES:  No edema; No deformity   ASSESSMENT AND PLAN: .    CAD with mod plaque LAD on coronary CTA 11/2022 FFR negative for significant stenosis. No significant valve disease and normal LV function on echo 10/2020.     Recurrent DVT/PE: History of  positive lupus anticoagulant. Followed by oncology.  Lifetime anticoagulation recommended. Followed by our coumadin clinic. No concerns today.    Hyperlipidemia: LDL 145 on 04/25/2022. Was advised to start rosuvastatin 5 mg by PCP. She is currently holding this to see if symptoms of fibromyalgia improved. Advised that we will further risk stratify with coronary CT, however she has an LDL goal of at least < 100.  Encouraged heart healthy, mostly plant-based diet and 150 minutes of moderate intensity exercise each week.         {Are you ordering a CV Procedure (e.g. stress test, cath, DCCV, TEE, etc)?   Press F2        :[id number]}  Dispo: ***  Signed, Jacolyn Reedy, PA-C

## 2023-01-27 ENCOUNTER — Other Ambulatory Visit (HOSPITAL_BASED_OUTPATIENT_CLINIC_OR_DEPARTMENT_OTHER): Payer: Self-pay | Admitting: Internal Medicine

## 2023-01-27 ENCOUNTER — Encounter (HOSPITAL_BASED_OUTPATIENT_CLINIC_OR_DEPARTMENT_OTHER): Payer: Self-pay | Admitting: Internal Medicine

## 2023-01-27 DIAGNOSIS — R911 Solitary pulmonary nodule: Secondary | ICD-10-CM

## 2023-01-31 ENCOUNTER — Ambulatory Visit: Payer: PPO

## 2023-01-31 ENCOUNTER — Encounter: Payer: Self-pay | Admitting: Obstetrics and Gynecology

## 2023-01-31 ENCOUNTER — Ambulatory Visit: Payer: PPO | Attending: Cardiology

## 2023-01-31 DIAGNOSIS — Z5181 Encounter for therapeutic drug level monitoring: Secondary | ICD-10-CM | POA: Diagnosis not present

## 2023-01-31 DIAGNOSIS — I82401 Acute embolism and thrombosis of unspecified deep veins of right lower extremity: Secondary | ICD-10-CM | POA: Diagnosis not present

## 2023-01-31 LAB — POCT INR: INR: 2.3 (ref 2.0–3.0)

## 2023-01-31 NOTE — Patient Instructions (Signed)
TAKE 1 TABLET TODAY ONLY THEN Continue taking Warfarin 1 tablet daily except for 1/2 tablet on Sundays, Tuesdays, and Thursdays.  Recheck INR 5 weeks.   Coumadin Clinic [phone]

## 2023-02-01 ENCOUNTER — Other Ambulatory Visit (HOSPITAL_COMMUNITY): Payer: Self-pay | Admitting: Internal Medicine

## 2023-02-01 ENCOUNTER — Ambulatory Visit: Payer: PPO | Admitting: Behavioral Health

## 2023-02-01 ENCOUNTER — Encounter: Payer: Self-pay | Admitting: Behavioral Health

## 2023-02-01 VITALS — BP 135/60 | HR 52 | Ht 62.0 in | Wt 120.0 lb

## 2023-02-01 DIAGNOSIS — F411 Generalized anxiety disorder: Secondary | ICD-10-CM

## 2023-02-01 DIAGNOSIS — R911 Solitary pulmonary nodule: Secondary | ICD-10-CM

## 2023-02-01 DIAGNOSIS — F331 Major depressive disorder, recurrent, moderate: Secondary | ICD-10-CM | POA: Diagnosis not present

## 2023-02-01 IMAGING — CT CT HEAD W/O CM
4 series · 17 of 47 positions shown, 19 images · non-contrast
Comparison: MRI head December 01, 2020.

CLINICAL DATA: Head trauma, minor (Age >= 65y)



[Series 2: head wo · axial · 0.40mm/px · z∈[-174,-69]mm · 7 of 29 slices shown, 9 images]
[im 4/29  brain]
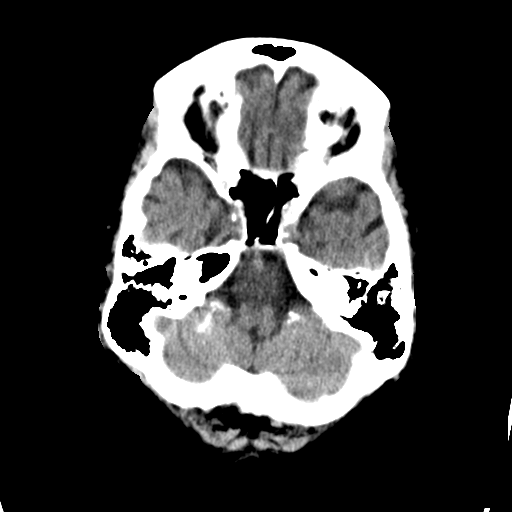
[im 4/29  bone]
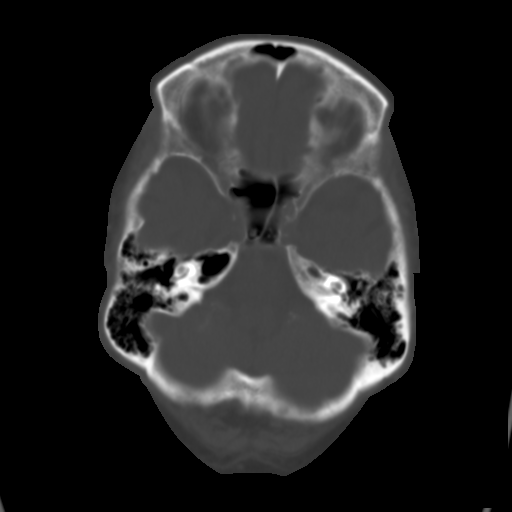
[im 8/29  brain]
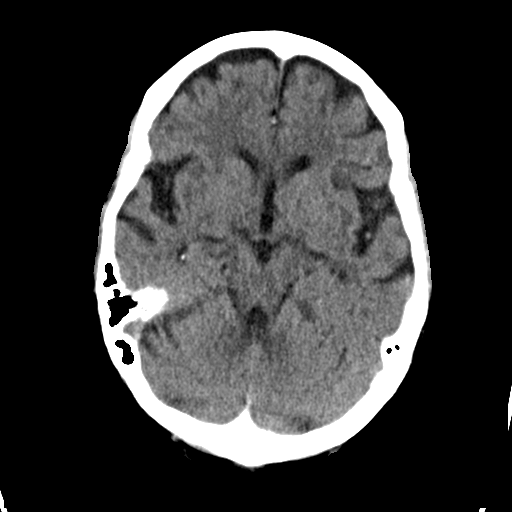
[im 11/29  brain]
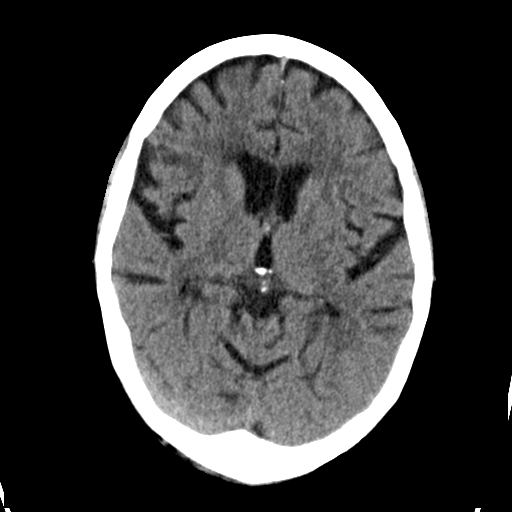
[im 15/29  brain]
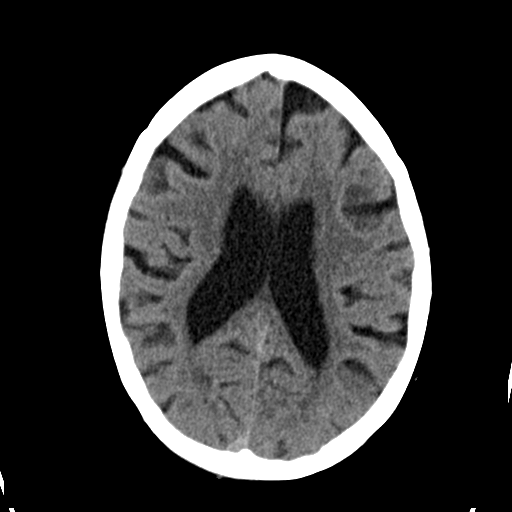
[im 18/29  brain]
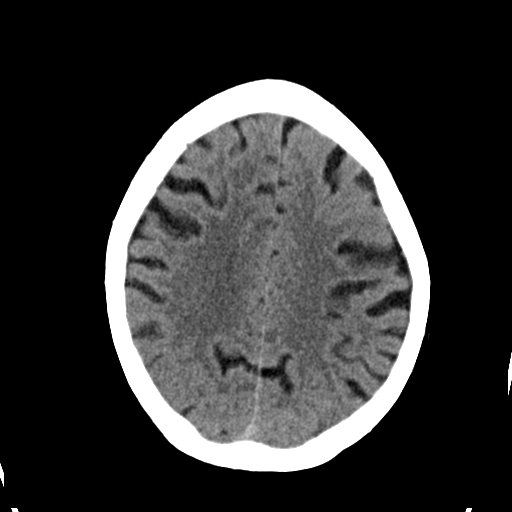
[im 18/29  bone]
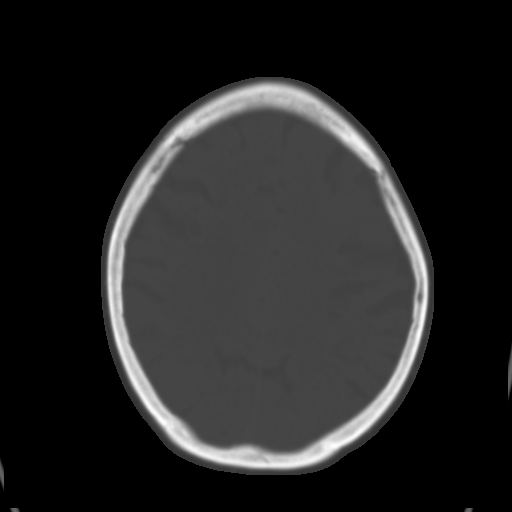
[im 22/29  brain]
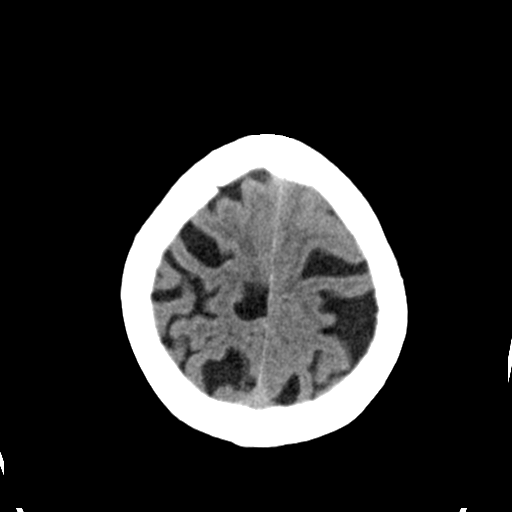
[im 25/29  brain]
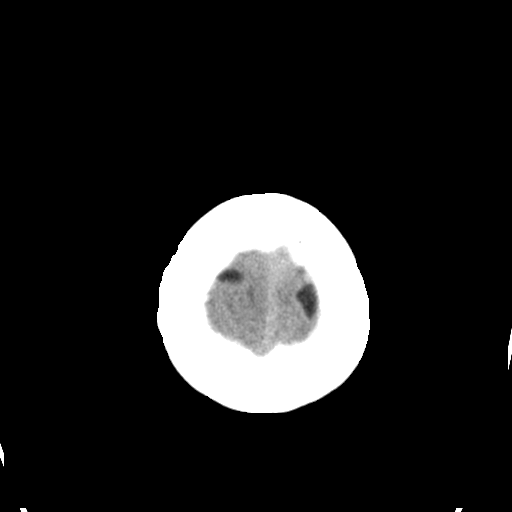

[Series 3: head bone · axial · 0.40mm/px · z∈[-175,-127]mm · 4 of 72 slices shown]
[im 8/72  bone]
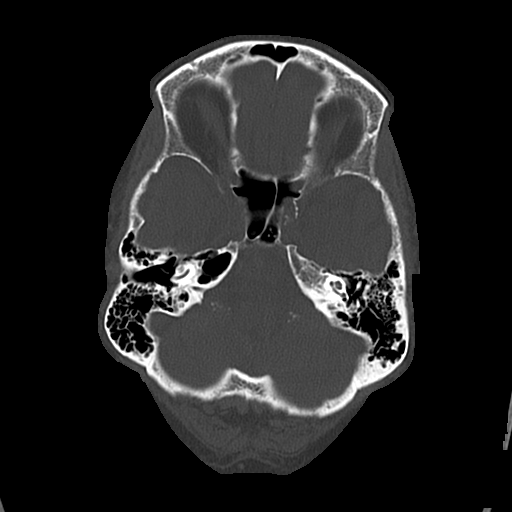
[im 15/72  bone]
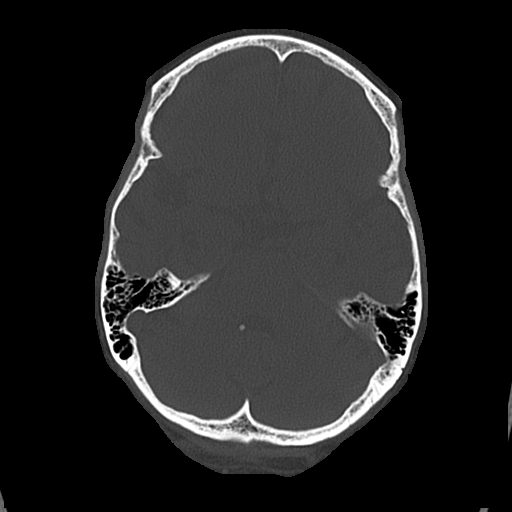
[im 22/72  bone]
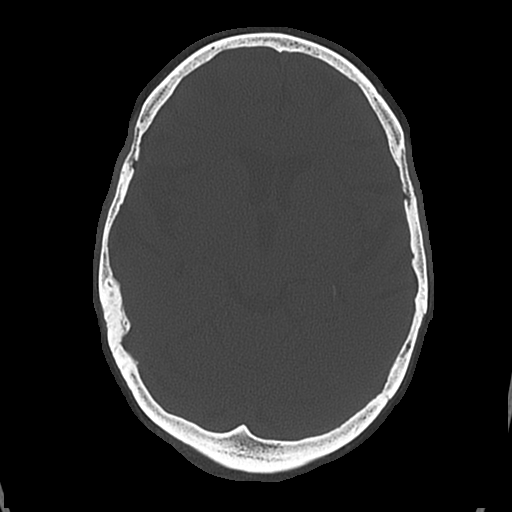
[im 32/72  bone]
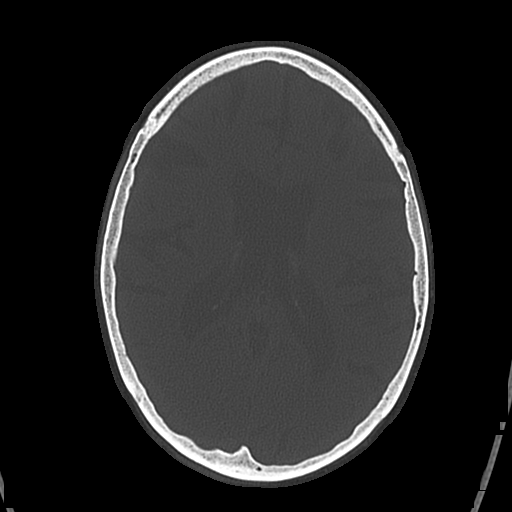

[Series 4: coronal soft · coronal · 0.29mm/px · 3 of 65 slices shown]
[im 22/65  brain]
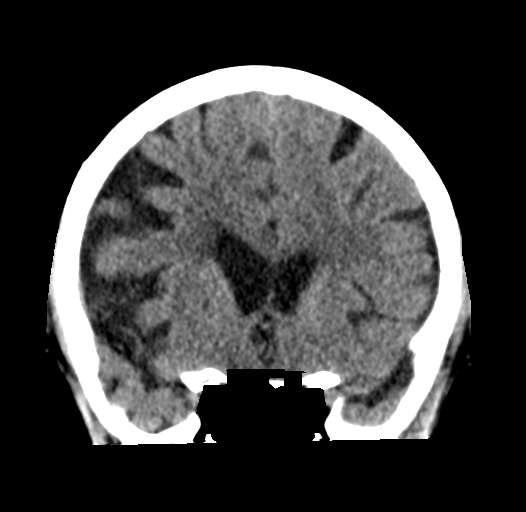
[im 29/65  brain]
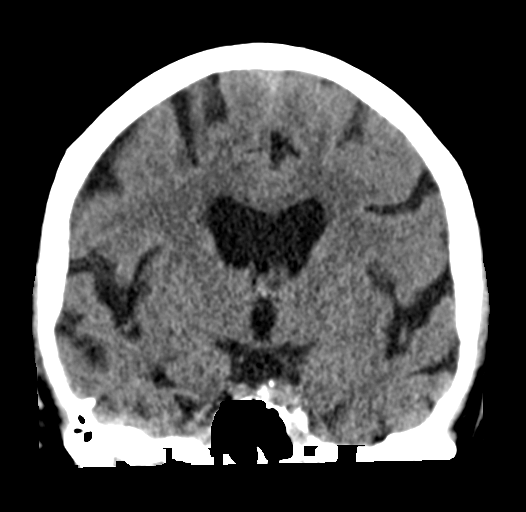
[im 36/65  brain]
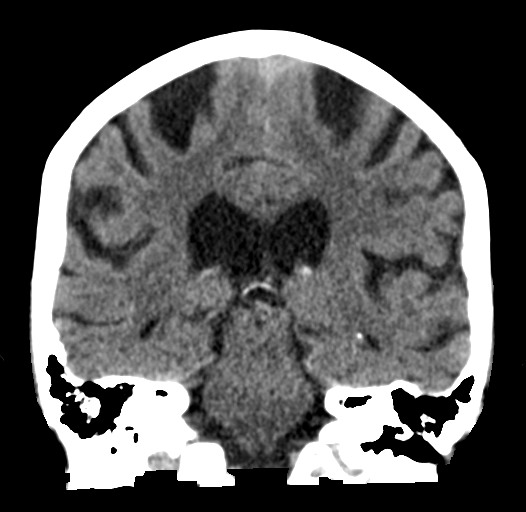

[Series 5: sagittal soft · sagittal · 0.29mm/px · 3 of 52 slices shown]
[im 18/52  brain]
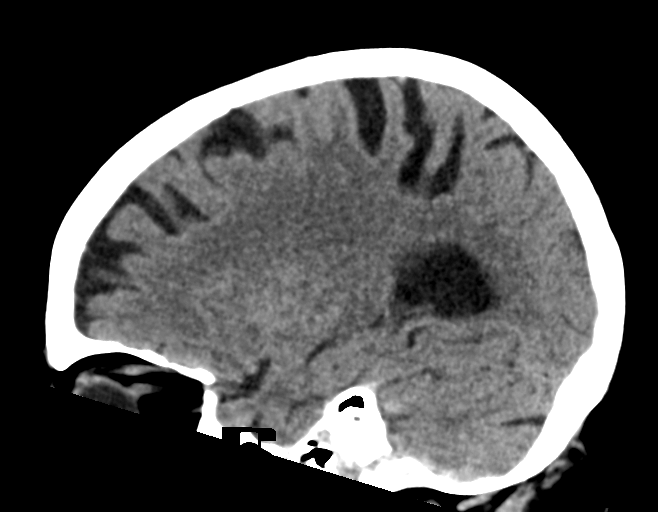
[im 26/52  brain]
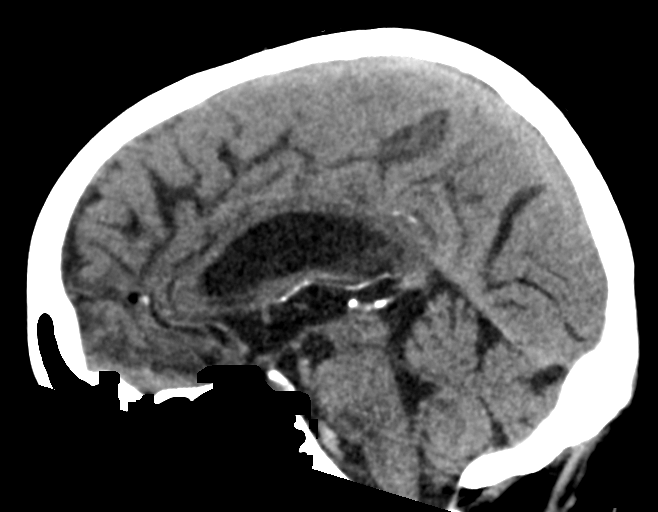
[im 35/52  brain]
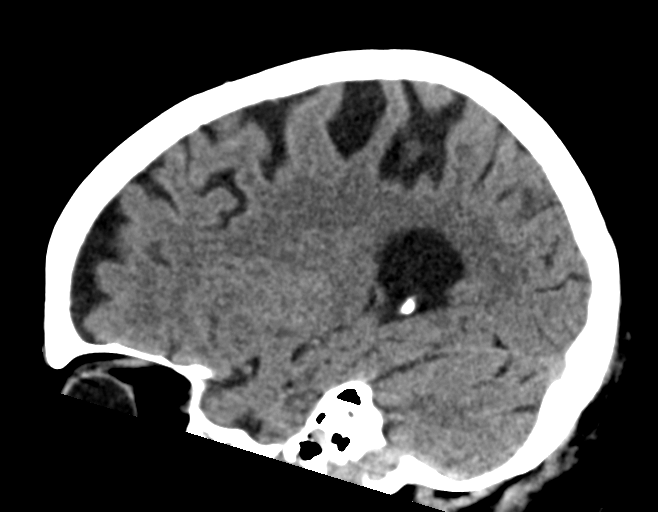

[17 of 47 positions shown; findings below may reference images not displayed]

FINDINGS: Brain: No evidence of acute infarction, hemorrhage, hydrocephalus,
extra-axial collection or mass lesion/mass effect. Cerebral atrophy.

Vascular: No hyperdense vessel identified.

Skull: No acute fracture.

Sinuses/Orbits: Clear sinuses.  No acute orbital findings.

Other: No mastoid effusions.
IMPRESSION: 1. No evidence of acute intracranial abnormality.
2.  Cerebral atrophy ([id number]).

## 2023-02-01 MED ORDER — VILAZODONE HCL 20 MG PO TABS
ORAL_TABLET | ORAL | 1 refills | Status: DC
Start: 2023-02-01 — End: 2023-03-08

## 2023-02-01 NOTE — Progress Notes (Signed)
[facility] MD/PA/NP Initial Note  02/01/2023 4:50 PM Emma Lopez  MRN:  [id number]  Chief Complaint:  Chief Complaint   Depression; Anxiety; Establish Care; Medication Problem; Patient Education; Stress; Fatigue     HPI:   "Emma Lopez", 77 year old female presents to this office for initial visit and to establish care.  Collateral information should be considered reliable.  Patient is very articulate, calm, and pleasant.  She is here today on referral from her PCP to consult with psychiatry.  Says that she is very frustrated because she has had multiple diagnostic testing that does not reveal the source of her continued chronic fatigue.  She also reports moderate levels of anxiety and depression that is currently not controlled with her Lexapro.  She says that she is currently living alone in [facility] and that her significant other passed away approximately 2 years ago.  She states that she does not have many remaining family members except for a couple of cousins.  They hold healthcare power of attorney and would handle her affairs if necessary.  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  Says that she has suffered with anxiety and depression for 20+ years and has been taking Lexapro for 10+ years.  She feels like the medication is not working well enough but also has concerns about the source of fatigue.  She reports depression today at 5/10, and anxiety at 6/10.  PHQ-9 score a 15.  She is sleeping 7 to 8 hours per night.  Her MDQ was grossly negative with no criterion on marked yes.  Says that she would like to consider medication changes today that may help. She denies any history of mania, no psychosis, no auditory or visual hallucinations, no delirium.  No current suicidal ideation or homicidal ideation.  Past psychiatric medication trials:  Lexapro Cymbalta BuSpar   Visit Diagnosis:    ICD-10-CM   1. Major depressive disorder, recurrent episode, moderate (HCC)  F33.1  Vilazodone HCl 20 MG TABS    2. Generalized anxiety disorder  F41.1 Vilazodone HCl 20 MG TABS      Past Psychiatric History: Anxiety, Depression, Chronic report of Fatigue,Fibromyalgia  Past Medical History:  Past Medical History:  Diagnosis Date   Abnormal EKG    Anxiety    Arthritis    oa   Bradycardia 12/14/2017   CIN III (cervical intraepithelial neoplasia III) 2000   Complication of anesthesia    did well last 2 times with procedures   Depression    DES exposure in utero    DVT (deep venous thrombosis) (HCC) 01/2009   LEFT LEG   DVT (deep venous thrombosis) (HCC) 10/30/2020   Dyspnea on exertion 10/2017   Elevated triglycerides with high cholesterol    Endometriosis    Fibromyalgia    GERD (gastroesophageal reflux disease)    History of colon polyps    History of hiatal hernia    told by some md has, some say not   IBS (irritable bowel syndrome) 2015   Left bundle branch block    intermittent   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   Lupus anticoagulant disorder (HCC)    Multinodular goiter    Osteoporosis 12/2017   T score -3.2 stable from prior study   PE (pulmonary embolism)    Pneumonia 03/01/2021   PONV (postoperative nausea and vomiting)    Restless legs syndrome    Sinus bradycardia    Sleep disorder    Thyroid disease  HYPERTHYROIDISM   Vitamin D deficiency     Past Surgical History:  Procedure Laterality Date   ABDOMINAL HYSTERECTOMY  2001   TAH, partial   APPENDECTOMY  1978   COLONOSCOPY WITH PROPOFOL N/A 12/07/2015   Procedure: COLONOSCOPY WITH PROPOFOL;  Surgeon: Charolett Bumpers, MD;  Location: [facility];  Service: Endoscopy;  Laterality: N/A;   COLONOSCOPY WITH PROPOFOL N/A 01/11/2022   Procedure: COLONOSCOPY WITH PROPOFOL;  Surgeon: Kerin Salen, MD;  Location: [facility];  Service: Gastroenterology;  Laterality: N/A;   colonscopy  7 yrs ago   other in past   ESOPHAGOGASTRODUODENOSCOPY (EGD) WITH PROPOFOL  N/A 12/07/2015   Procedure: ESOPHAGOGASTRODUODENOSCOPY (EGD) WITH PROPOFOL;  Surgeon: Charolett Bumpers, MD;  Location: [facility];  Service: Endoscopy;  Laterality: N/A;   ESOPHAGOGASTRODUODENOSCOPY ENDOSCOPY  yrs ago   FACIAL COSMETIC SURGERY     GYNECOLOGIC CRYOSURGERY     HEMOSTASIS CLIP PLACEMENT  01/11/2022   Procedure: HEMOSTASIS CLIP PLACEMENT;  Surgeon: Kerin Salen, MD;  Location: [facility];  Service: Gastroenterology;;   HOT HEMOSTASIS N/A 01/11/2022   Procedure: HOT HEMOSTASIS (ARGON PLASMA COAGULATION/BICAP);  Surgeon: Kerin Salen, MD;  Location: [facility];  Service: Gastroenterology;  Laterality: N/A;   MYOMECTOMY     POLYPECTOMY  01/11/2022   Procedure: POLYPECTOMY;  Surgeon: Kerin Salen, MD;  Location: [facility];  Service: Gastroenterology;;   REPLACEMENT TOTAL JOINT WRIST W/ PROSTHETIC IMPLANT Right 08/01/2021   TONSILLECTOMY      Family Psychiatric History: depression  Family History:  Family History  Problem Relation Age of Onset   Depression Mother    Anxiety disorder Mother    Hypertension Mother    Breast cancer Mother        62's   Cancer Father        COLON   Depression Sister    Anxiety disorder Sister    Hypertension Sister    Stroke Sister    Breast cancer Maternal Aunt        Age 18's   Cancer Maternal Aunt        Melanoma   Alzheimer's disease Maternal Aunt    Breast cancer Maternal Aunt        70's   Cancer Maternal Aunt        Colon CA   Alzheimer's disease Maternal Aunt    Cancer Paternal Aunt        OVARIAN and COLON    Social History:  Social History   Socioeconomic History   Marital status: Single    Spouse name: Not on file   Number of children: 0   Years of education: 16   Highest education level: Bachelor's degree (e.g., BA, AB, BS)  Occupational History   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  Tobacco Use   Smoking status: Former    Packs/day: 0.10    Years: 6.00    Additional pack years: 0.00    Total  pack years: 0.60    Types: Cigarettes    Quit date: 08/01/1978    Years since quitting: 44.5   Smokeless tobacco: Never   Tobacco comments:    only smoked 4-6 yrs 1 pack per month  Vaping Use   Vaping Use: Never used  Substance and Sexual Activity   Alcohol use: Yes    Comment: 1 drink per month   Drug use: No   Sexual activity: Not Currently    Birth control/protection: Surgical    Comment: HYSTERECTOMY-1st intercourse 27-Fewer than 5 partners  Other Topics Concern   Not on file  Social History Narrative   Lives alone in [place].    Social Determinants of Health   Financial Resource Strain: Not on file  Food Insecurity: Not on file  Transportation Needs: Not on file  Physical Activity: Not on file  Stress: Not on file  Social Connections: Not on file    Allergies:  Allergies  Allergen Reactions   Sulfa Antibiotics Rash and Other (See Comments)    Metabolic Disorder Labs: No results found for: "HGBA1C", "MPG" No results found for: "PROLACTIN" No results found for: "CHOL", "TRIG", "HDL", "CHOLHDL", "VLDL", "LDLCALC" Lab Results  Component Value Date   TSH 0.498 10/17/2021   TSH 0.411 11/17/2020    Therapeutic Level Labs: No results found for: "LITHIUM" No results found for: "VALPROATE" No results found for: "CBMZ"  Current Medications: Current Outpatient Medications  Medication Sig Dispense Refill   Vilazodone HCl 20 MG TABS Take 1/2 tablet by mouth for 7 days, then one whole tablet daily. Must take with food. 30 tablet 1   ALPRAZolam (XANAX) 0.25 MG tablet Take 1 tablet (0.25 mg total) by mouth every 8 (eight) hours as needed for anxiety. 30 tablet 0   atorvastatin (LIPITOR) 20 MG tablet Take 1 tablet (20 mg total) by mouth daily. 30 tablet 3   denosumab (PROLIA) 60 MG/ML SOLN injection Inject 60 mg into the skin every 6 (six) months. Administer in upper arm, thigh, or abdomen 180 mL 2   escitalopram (LEXAPRO) 20 MG tablet Take 20 mg by mouth daily.      methocarbamol (ROBAXIN) 500 MG tablet Take 250 mg by mouth daily as needed for muscle spasms.     rOPINIRole (REQUIP) 1 MG tablet Take 1 tablet (1 mg total) by mouth at bedtime. 90 tablet 2   warfarin (COUMADIN) 1 MG tablet Take 1/2 tablet to 1 tablet by mouth daily or as directed by Anticoagulation Clinic. 90 tablet 1   Current Facility-Administered Medications  Medication Dose Route Frequency Provider Last Rate Last Admin   denosumab (PROLIA) injection 60 mg  60 mg Subcutaneous Once Patton Salles, MD        Medication Side Effects: none  Orders placed this visit:  No orders of the defined types were placed in this encounter.   Psychiatric Specialty Exam:  Review of Systems  Constitutional:  Positive for fatigue.  Eyes:  Positive for pain.  Respiratory:  Positive for shortness of breath.   Gastrointestinal:  Positive for abdominal pain and diarrhea.  Genitourinary:  Positive for frequency and urgency.  Neurological:  Positive for weakness and headaches.  Hematological:  Bruises/bleeds easily.    Blood pressure 135/60, pulse (!) 52, height 5\' 2"  (1.575 m), weight 120 lb (54.4 kg).Body mass index is 21.95 kg/m.  General Appearance: Casual, Neat, and Well Groomed  Eye Contact:  Good  Speech:  Clear and Coherent  Volume:  Normal  Mood:  Anxious, Depressed, and Dysphoric  Affect:  Congruent, Depressed, and Anxious  Thought Process:  Coherent  Orientation:  Full (Time, Place, and Person)  Thought Content: Logical   Suicidal Thoughts:  No  Homicidal Thoughts:  No  Memory:  WNL  Judgement:  Good  Insight:  Good  Psychomotor Activity:  Normal  Concentration:  Concentration: Good  Recall:  Good  Fund of Knowledge: Good  Language: Good  Assets:  Desire for Improvement  ADL's:  Intact  Cognition: WNL  Prognosis:  Good   Screenings:  PHQ2-9    Flowsheet Row Office Visit from 02/01/2023 in [facility]  PHQ-2 Total Score 4  PHQ-9 Total  Score 15      Flowsheet Row ED from 10/31/2022 in [facility] Department at [facility] ED from 10/03/2022 in [facility] Emergency Department at [facility] ED from 01/12/2022 in [facility] Emergency Department at [facility]  C-SSRS RISK CATEGORY No Risk No Risk No Risk       Receiving Psychotherapy: No   Treatment Plan/Recommendations:   Greater than 50% of face to face time with patient was spent on counseling and coordination of care. We discussed her long hx of anxiety and depression leading back twenty plus years ago. Family hx with mother and sister. We talked about her  family dynamic and she is living alone with only a couple of cousins still remaining.  Her significant other passed a couple of years ago. We talked about her concerns with extreme idiopathic fatigue and her frustration with not finding cause. Labs normal, diagnostics normal. Does have hx of sinus brady, and left bundle branch block which could contribute, along with use of blood thinners. We discussed medications and previous plan of care. Talked about her goals for tx here.  We agreed to:  Will decrease Lexapro to 10 mg for two weeks, then  5 mg for two weeks.  Will start Viibryd 10 mg for 7 days, then 20 mg daily  Will report side effects or worsening symptoms promptly Will follow-up in 4 weeks to reassess Provided emergency contact information Reviewed PDMP  Joan Flores, NP

## 2023-02-01 NOTE — Progress Notes (Deleted)
[facility] Med Check  Patient ID: Emma Lopez,  MRN: [id number]  PCP: Melida Quitter, MD  Date of Evaluation: 02/01/2023 Time spent:{TIME; 0 MIN TO 60 [phone]  Chief Complaint:   HISTORY/CURRENT STATUS: HPI  Individual Medical History/ Review of Systems: Changes? :{EXAM; YES/NO:21197}  Allergies: Sulfa antibiotics  Current Medications:  Current Outpatient Medications:    ALPRAZolam (XANAX) 0.25 MG tablet, Take 1 tablet (0.25 mg total) by mouth every 8 (eight) hours as needed for anxiety., Disp: 30 tablet, Rfl: 0   atorvastatin (LIPITOR) 20 MG tablet, Take 1 tablet (20 mg total) by mouth daily., Disp: 30 tablet, Rfl: 3   denosumab (PROLIA) 60 MG/ML SOLN injection, Inject 60 mg into the skin every 6 (six) months. Administer in upper arm, thigh, or abdomen, Disp: 180 mL, Rfl: 2   escitalopram (LEXAPRO) 20 MG tablet, Take 20 mg by mouth daily., Disp: , Rfl:    methocarbamol (ROBAXIN) 500 MG tablet, Take 250 mg by mouth daily as needed for muscle spasms., Disp: , Rfl:    rOPINIRole (REQUIP) 1 MG tablet, Take 1 tablet (1 mg total) by mouth at bedtime., Disp: 90 tablet, Rfl: 2   warfarin (COUMADIN) 1 MG tablet, Take 1/2 tablet to 1 tablet by mouth daily or as directed by Anticoagulation Clinic., Disp: 90 tablet, Rfl: 1  Current Facility-Administered Medications:    denosumab (PROLIA) injection 60 mg, 60 mg, Subcutaneous, Once, Patton Salles, MD Medication Side Effects: {Medication Side Effects (Optional):12147}  Family Medical/ Social History: Changes? {EXAM; YES/NO:19492}  MENTAL HEALTH EXAM:  There were no vitals taken for this visit.There is no height or weight on file to calculate BMI.  General Appearance: {PSY:[phone]}  Eye Contact:  {PSY:22684}  Speech:  {PSY:[id number]}  Volume:  {PSY:22686}  Mood:  {PSY:22306}  Affect:  {[phone]}  Thought Process:  {PSY:22688}  Orientation:  {PSY:22689}  Thought Content: {PSYt:22690}   Suicidal  Thoughts:  {PSY:22692}  Homicidal Thoughts:  {PSY:22692}  Memory:  {PSY:[phone]}  Judgement:  {PSY:22694}  Insight:  {PSY:22695}  Psychomotor Activity:  {PSY:22696}  Concentration:  {PSY:21399}  Recall:  {PSY:22877}  Fund of Knowledge: {PSY:22877}  Language: {WJX:91478}  Assets:  {PSY:22698}  ADL's:  {PSY:22290}  Cognition: {PSY:304700322}  Prognosis:  {PSY:22877}    DIAGNOSES:    ICD-10-CM   1. Major depressive disorder, recurrent episode, moderate (HCC)  F33.1     2. Generalized anxiety disorder  F41.1       Receiving Psychotherapy: {GNF:62130}   RECOMMENDATIONS: ***   Joan Flores, NP

## 2023-02-07 ENCOUNTER — Ambulatory Visit: Payer: PPO | Admitting: Physician Assistant

## 2023-02-11 ENCOUNTER — Encounter (HOSPITAL_BASED_OUTPATIENT_CLINIC_OR_DEPARTMENT_OTHER): Payer: Self-pay

## 2023-02-11 ENCOUNTER — Emergency Department (HOSPITAL_BASED_OUTPATIENT_CLINIC_OR_DEPARTMENT_OTHER): Payer: PPO

## 2023-02-11 ENCOUNTER — Emergency Department (HOSPITAL_BASED_OUTPATIENT_CLINIC_OR_DEPARTMENT_OTHER)
Admission: EM | Admit: 2023-02-11 | Discharge: 2023-02-11 | Disposition: A | Discharge status: Home / Self Care | Payer: PPO | Source: Home / Self Care | Attending: Emergency Medicine | Admitting: Emergency Medicine

## 2023-02-11 ENCOUNTER — Other Ambulatory Visit: Payer: Self-pay

## 2023-02-11 DIAGNOSIS — R109 Unspecified abdominal pain: Secondary | ICD-10-CM

## 2023-02-11 DIAGNOSIS — R1032 Left lower quadrant pain: Secondary | ICD-10-CM | POA: Diagnosis not present

## 2023-02-11 DIAGNOSIS — K760 Fatty (change of) liver, not elsewhere classified: Secondary | ICD-10-CM | POA: Diagnosis not present

## 2023-02-11 DIAGNOSIS — K8020 Calculus of gallbladder without cholecystitis without obstruction: Secondary | ICD-10-CM | POA: Diagnosis not present

## 2023-02-11 DIAGNOSIS — R001 Bradycardia, unspecified: Secondary | ICD-10-CM | POA: Diagnosis not present

## 2023-02-11 LAB — COMPREHENSIVE METABOLIC PANEL
ALT: 14 U/L (ref 0–44)
AST: 20 U/L (ref 15–41)
Albumin: 3.6 g/dL (ref 3.5–5.0)
Alkaline Phosphatase: 61 U/L (ref 38–126)
Anion gap: 7 (ref 5–15)
BUN: 15 mg/dL (ref 8–23)
CO2: 26 mmol/L (ref 22–32)
Calcium: 8.4 mg/dL — ABNORMAL LOW (ref 8.9–10.3)
Chloride: 106 mmol/L (ref 98–111)
Creatinine, Ser: 0.72 mg/dL (ref 0.44–1.00)
GFR, Estimated: >60 mL/min (ref >60)
Glucose, Bld: 100 mg/dL — ABNORMAL HIGH (ref 70–99)
Potassium: 4.3 mmol/L (ref 3.5–5.1)
Sodium: 139 mmol/L (ref 135–145)
Total Bilirubin: 0.5 mg/dL (ref 0.3–1.2)
Total Protein: 6.8 g/dL (ref 6.5–8.1)

## 2023-02-11 LAB — URINALYSIS, ROUTINE W REFLEX MICROSCOPIC
Bilirubin Urine: NEGATIVE
Glucose, UA: NEGATIVE mg/dL
Hgb urine dipstick: NEGATIVE
Ketones, ur: NEGATIVE mg/dL
Leukocytes,Ua: NEGATIVE
Nitrite: NEGATIVE
Protein, ur: NEGATIVE mg/dL
Specific Gravity, Urine: 1.02 (ref 1.005–1.030)
pH: 5.5 (ref 5.0–8.0)

## 2023-02-11 LAB — CBC
HCT: 43.7 % (ref 36.0–46.0)
Hemoglobin: 14.6 g/dL (ref 12.0–15.0)
MCH: 31.3 pg (ref 26.0–34.0)
MCHC: 33.4 g/dL (ref 30.0–36.0)
MCV: 93.6 fL (ref 80.0–100.0)
Platelets: 239 10*3/uL (ref 150–400)
RBC: 4.67 MIL/uL (ref 3.87–5.11)
RDW: 13.4 % (ref 11.5–15.5)
WBC: 6.5 10*3/uL (ref 4.0–10.5)
nRBC: 0 % (ref 0.0–0.2)

## 2023-02-11 MED ORDER — IOHEXOL 300 MG/ML  SOLN
75.0000 mL | Freq: Once | INTRAMUSCULAR | Status: AC | PRN
  Administered 2023-02-11: 75 mL via INTRAVENOUS

## 2023-02-11 NOTE — ED Provider Notes (Signed)
Emergency Department Provider Note   I have reviewed the triage vital signs and the nursing notes.   HISTORY  Chief Complaint Flank Pain   HPI Emma Lopez is a 77 y.o. female with PMH reviewed presents to the emergency department for evaluation of left abdominal and flank pain.  Symptoms have been gradually worsening over the past 3 days.  She has not noticed a rash in this location.  No nausea or vomiting.  She has had ongoing loose stools for the past couple of months and does have a history of IBS but states this feels different than her prior IBS symptoms.  No blood in the stool.  No dysuria. No fevers.    Past Medical History:  Diagnosis Date   Abnormal EKG    Anxiety    Arthritis    oa   Bradycardia 12/14/2017   CIN III (cervical intraepithelial neoplasia III) 2000   Complication of anesthesia    did well last 2 times with procedures   Depression    DES exposure in utero    DVT (deep venous thrombosis) (HCC) 01/2009   LEFT LEG   DVT (deep venous thrombosis) (HCC) 10/30/2020   Dyspnea on exertion 10/2017   Elevated triglycerides with high cholesterol    Endometriosis    Fibromyalgia    GERD (gastroesophageal reflux disease)    History of colon polyps    History of hiatal hernia    told by some md has, some say not   IBS (irritable bowel syndrome) 2015   Left bundle branch block    intermittent   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   Lupus anticoagulant disorder (HCC)    Multinodular goiter    Osteoporosis 12/2017   T score -3.2 stable from prior study   PE (pulmonary embolism)    Pneumonia 03/01/2021   PONV (postoperative nausea and vomiting)    Restless legs syndrome    Sinus bradycardia    Sleep disorder    Thyroid disease    HYPERTHYROIDISM   Vitamin D deficiency     Review of Systems  Constitutional: No fever/chills Cardiovascular: Denies chest pain. Respiratory: Denies shortness of breath. Gastrointestinal:  Positive left abdominal pain.  No nausea, no vomiting.  No diarrhea.  No constipation. Genitourinary: Negative for dysuria. Musculoskeletal: Negative for back pain. Skin: Negative for rash. Neurological: Negative for headaches, focal weakness or numbness.  ____________________________________________   PHYSICAL EXAM:  VITAL SIGNS: ED Triage Vitals  Encounter Vitals Group     BP 02/11/23 1234 (!) 123/53     Pulse Rate 02/11/23 1234 (!) 52     Resp 02/11/23 1234 14     Temp 02/11/23 1234 (!) 97 F (36.1 C)     Temp src --      SpO2 02/11/23 1234 98 %     Weight 02/11/23 1253 119 lb 0.8 oz (54 kg)     Height 02/11/23 1253 5\' 2"  (1.575 m)   Constitutional: Alert and oriented. Well appearing and in no acute distress. Eyes: Conjunctivae are normal.  Head: Atraumatic. Nose: No congestion/rhinnorhea. Mouth/Throat: Mucous membranes are moist.  Neck: No stridor.   Cardiovascular: Normal rate, regular rhythm. Good peripheral circulation. Grossly normal heart sounds.   Respiratory: Normal respiratory effort.  No retractions. Lungs CTAB. Gastrointestinal: Soft with some mild, focal LLQ tenderness. No peritonitis. No distention.  Musculoskeletal: No lower extremity tenderness nor edema. No gross deformities of extremities. Neurologic:  Normal speech and language.  No gross focal neurologic deficits are appreciated.  Skin:  Skin is warm, dry and intact. No rash noted. ____________________________________________   LABS (all labs ordered are listed, but only abnormal results are displayed)  Labs Reviewed  COMPREHENSIVE METABOLIC PANEL - Abnormal; Notable for the following components:      Result Value   Glucose, Bld 100 (*)    Calcium 8.4 (*)    All other components within normal limits  URINALYSIS, ROUTINE W REFLEX MICROSCOPIC - Abnormal; Notable for the following components:   Color, Urine STRAW (*)    All other components within normal limits  CBC    ____________________________________________  RADIOLOGY  CT ABDOMEN PELVIS W CONTRAST  Result Date: 02/11/2023 CLINICAL DATA:  Left lower quadrant abdominal pain and left flank pain EXAM: CT ABDOMEN AND PELVIS WITH CONTRAST TECHNIQUE: Multidetector CT imaging of the abdomen and pelvis was performed using the standard protocol following bolus administration of intravenous contrast. RADIATION DOSE REDUCTION: This exam was performed according to the departmental dose-optimization program which includes automated exposure control, adjustment of the mA and/or kV according to patient size and/or use of iterative reconstruction technique. CONTRAST:  75mL OMNIPAQUE IOHEXOL 300 MG/ML  SOLN COMPARISON:  CT abdomen and pelvis 11/17/2020 FINDINGS: Lower chest: No acute abnormality. Hepatobiliary: Hepatic steatosis. Cholelithiasis without evidence of cholecystitis. No biliary dilation. Pancreas: No acute abnormality. Spleen: Unremarkable. Adrenals/Urinary Tract: Stable adrenal glands. No urinary calculi or hydronephrosis. Unremarkable bladder. Stomach/Bowel: Normal caliber large and small bowel. No bowel wall thickening. The appendix is not definitively visualized. No secondary signs of appendicitis. Unremarkable stomach. Vascular/Lymphatic: Aortic atherosclerosis. No enlarged abdominal or pelvic lymph nodes. Reproductive: Hysterectomy.  No adnexal mass. Other: No free intraperitoneal fluid or air. Musculoskeletal: No acute fracture or destructive osseous lesion. IMPRESSION: 1. No acute abnormality in the abdomen or pelvis. 2. Hepatic steatosis. Aortic Atherosclerosis (ICD10-I70.0). Electronically Signed   By: Minerva Fester M.D.   On: 02/11/2023 17:51    ____________________________________________   PROCEDURES  Procedure(s) performed:   Procedures  None  ____________________________________________   INITIAL IMPRESSION / ASSESSMENT AND PLAN / ED COURSE  Pertinent labs & imaging results that were  available during my care of the patient were reviewed by me and considered in my medical decision making (see chart for details).   This patient is Presenting for Evaluation of abdominal pain, which does require a range of treatment options, and is a complaint that involves a high risk of morbidity and mortality.  The Differential Diagnoses Differential diagnosis includes but is not exclusive to ovarian cyst, ovarian torsion, acute appendicitis, urinary tract infection, bowel obstruction, hernia, colitis, renal colic, gastroenteritis, volvulus etc.  I decided to review pertinent External Data, and in summary no recent visits for similar symptoms.   Clinical Laboratory Tests Ordered, included CBC without leukocytosis or anemia.  CMP without acute kidney injury.  LFTs and bilirubin normal.  UA without infection.   Radiologic Tests Ordered, included CT abdomen/pelvis. I independently interpreted the images and agree with radiology interpretation.   Cardiac Monitor Tracing which shows sinus bradycardia.    Social Determinants of Health Risk patient is a non-smoker.   Medical Decision Making: Summary:  Patient with prior history of hysterectomy presents emergency department left flank and lower abdominal pain.  Mild tenderness on exam.  No overlying rash to suspect zoster.  Plan for CT imaging and reassess  Reevaluation with update and discussion with patient.  CT scan reassuring.  UA without infection.  She plans to take her home  Bentyl along with Tylenol and follow with her primary care doctor.  Considered admission but ED workup is reassuring. Stable for discharge.   Patient's presentation is most consistent with acute presentation with potential threat to life or bodily function.   Disposition: discharge  ____________________________________________  FINAL CLINICAL IMPRESSION(S) / ED DIAGNOSES  Final diagnoses:  Left flank pain    Note:  This document was prepared using Dragon  voice recognition software and may include unintentional dictation errors.  Alona Bene, MD, [facility] Emergency Medicine    Channin Agustin, Arlyss Repress, MD 02/11/23 [phone]

## 2023-02-11 NOTE — ED Triage Notes (Signed)
Left side flank pain since Tuesday. Denied fever, no N/V. No urinary symptoms.

## 2023-02-11 NOTE — Discharge Instructions (Signed)

## 2023-02-13 DIAGNOSIS — F419 Anxiety disorder, unspecified: Secondary | ICD-10-CM | POA: Diagnosis not present

## 2023-02-13 DIAGNOSIS — R0789 Other chest pain: Secondary | ICD-10-CM | POA: Diagnosis not present

## 2023-02-13 DIAGNOSIS — R9431 Abnormal electrocardiogram [ECG] [EKG]: Secondary | ICD-10-CM | POA: Diagnosis not present

## 2023-02-13 DIAGNOSIS — I447 Left bundle-branch block, unspecified: Secondary | ICD-10-CM | POA: Diagnosis not present

## 2023-02-13 DIAGNOSIS — R1032 Left lower quadrant pain: Secondary | ICD-10-CM | POA: Diagnosis not present

## 2023-02-13 DIAGNOSIS — E785 Hyperlipidemia, unspecified: Secondary | ICD-10-CM | POA: Diagnosis not present

## 2023-02-13 DIAGNOSIS — K589 Irritable bowel syndrome without diarrhea: Secondary | ICD-10-CM | POA: Diagnosis not present

## 2023-02-13 DIAGNOSIS — I2510 Atherosclerotic heart disease of native coronary artery without angina pectoris: Secondary | ICD-10-CM | POA: Diagnosis not present

## 2023-02-14 ENCOUNTER — Emergency Department (HOSPITAL_COMMUNITY)
Admission: EM | Admit: 2023-02-14 | Discharge: 2023-02-14 | Disposition: A | Discharge status: Home / Self Care | Payer: PPO | Attending: Emergency Medicine | Admitting: Emergency Medicine

## 2023-02-14 ENCOUNTER — Other Ambulatory Visit: Payer: Self-pay

## 2023-02-14 DIAGNOSIS — R109 Unspecified abdominal pain: Secondary | ICD-10-CM

## 2023-02-14 DIAGNOSIS — K5792 Diverticulitis of intestine, part unspecified, without perforation or abscess without bleeding: Secondary | ICD-10-CM | POA: Insufficient documentation

## 2023-02-14 DIAGNOSIS — R1012 Left upper quadrant pain: Secondary | ICD-10-CM | POA: Diagnosis not present

## 2023-02-14 LAB — BASIC METABOLIC PANEL
Anion gap: 9 (ref 5–15)
BUN: 22 mg/dL (ref 8–23)
CO2: 24 mmol/L (ref 22–32)
Calcium: 8.7 mg/dL — ABNORMAL LOW (ref 8.9–10.3)
Chloride: 106 mmol/L (ref 98–111)
Creatinine, Ser: 0.92 mg/dL (ref 0.44–1.00)
GFR, Estimated: >60 mL/min (ref >60)
Glucose, Bld: 103 mg/dL — ABNORMAL HIGH (ref 70–99)
Potassium: 3.8 mmol/L (ref 3.5–5.1)
Sodium: 139 mmol/L (ref 135–145)

## 2023-02-14 LAB — CBC WITH DIFFERENTIAL/PLATELET
Abs Immature Granulocytes: 0.01 10*3/uL (ref 0.00–0.07)
Basophils Absolute: 0 10*3/uL (ref 0.0–0.1)
Basophils Relative: 1 %
Eosinophils Absolute: 0.1 10*3/uL (ref 0.0–0.5)
Eosinophils Relative: 1 %
HCT: 45.3 % (ref 36.0–46.0)
Hemoglobin: 14.8 g/dL (ref 12.0–15.0)
Immature Granulocytes: 0 %
Lymphocytes Relative: 23 %
Lymphs Abs: 1.2 10*3/uL (ref 0.7–4.0)
MCH: 30.8 pg (ref 26.0–34.0)
MCHC: 32.7 g/dL (ref 30.0–36.0)
MCV: 94.4 fL (ref 80.0–100.0)
Monocytes Absolute: 0.5 10*3/uL (ref 0.1–1.0)
Monocytes Relative: 9 %
Neutro Abs: 3.6 10*3/uL (ref 1.7–7.7)
Neutrophils Relative %: 66 %
Platelets: 246 10*3/uL (ref 150–400)
RBC: 4.8 MIL/uL (ref 3.87–5.11)
RDW: 13.2 % (ref 11.5–15.5)
WBC: 5.5 10*3/uL (ref 4.0–10.5)
nRBC: 0 % (ref 0.0–0.2)

## 2023-02-14 LAB — URINALYSIS, W/ REFLEX TO CULTURE (INFECTION SUSPECTED)
Bilirubin Urine: NEGATIVE
Glucose, UA: NEGATIVE mg/dL
Ketones, ur: NEGATIVE mg/dL
Nitrite: NEGATIVE
Protein, ur: NEGATIVE mg/dL
Specific Gravity, Urine: 1.012 (ref 1.005–1.030)
pH: 5 (ref 5.0–8.0)

## 2023-02-14 LAB — TROPONIN I (HIGH SENSITIVITY): Troponin I (High Sensitivity): 9 ng/L (ref <18)

## 2023-02-14 MED ORDER — AMOXICILLIN-POT CLAVULANATE 875-125 MG PO TABS
1.0000 | ORAL_TABLET | Freq: Once | ORAL | Status: AC
  Administered 2023-02-14: 1 via ORAL
  Filled 2023-02-14: qty 1

## 2023-02-14 MED ORDER — KETOROLAC TROMETHAMINE 30 MG/ML IJ SOLN
15.0000 mg | Freq: Once | INTRAMUSCULAR | Status: AC
  Administered 2023-02-14: 15 mg via INTRAVENOUS
  Filled 2023-02-14: qty 1

## 2023-02-14 MED ORDER — PANTOPRAZOLE SODIUM 40 MG IV SOLR
40.0000 mg | Freq: Once | INTRAVENOUS | Status: AC
  Administered 2023-02-14: 40 mg via INTRAVENOUS
  Filled 2023-02-14: qty 10

## 2023-02-14 MED ORDER — SUCRALFATE 1 G PO TABS: 1.0000 g | ORAL_TABLET | Freq: Three times a day (TID) | ORAL | 0 refills | Status: DC

## 2023-02-14 MED ORDER — SUCRALFATE 1 G PO TABS
1.0000 g | ORAL_TABLET | Freq: Once | ORAL | Status: AC
  Administered 2023-02-14: 1 g via ORAL
  Filled 2023-02-14: qty 1

## 2023-02-14 MED ORDER — ONDANSETRON 8 MG PO TBDP: 8.0000 mg | ORAL_TABLET | Freq: Three times a day (TID) | ORAL | 0 refills | Status: DC | PRN

## 2023-02-14 MED ORDER — MORPHINE SULFATE (PF) 4 MG/ML IV SOLN
6.0000 mg | Freq: Once | INTRAVENOUS | Status: AC
  Administered 2023-02-14: 6 mg via INTRAVENOUS
  Filled 2023-02-14: qty 2

## 2023-02-14 NOTE — Discharge Instructions (Signed)
We sent a message to GI team.  Dr. Laurey Morale medical assistant will call you with an appointment.  For now, please take Augmentin.  Also take Carafate in case there is gastritis as the cause  -but take that more than an hour after Coumadin.  Return to emergency room if starting worsening pain, severe nausea and vomiting, fevers or chills.

## 2023-02-14 NOTE — ED Provider Notes (Signed)
[place] EMERGENCY DEPARTMENT AT [facility] Provider Note   CSN: [id number] Arrival date & time: 02/14/23  0848     History  Chief Complaint  Patient presents with   Abdominal Pain    Emma Lopez is a 77 y.o. female.  HPI    77 year old patient comes in with chief complaint of abdominal pain.  Patient has history of DVT/PE on Coumadin, hyperlipidemia, GERD, hysterectomy.  She comes to the ER with abdominal pain that has been present now for about 4 to 5 days.  Patient describes the pain as left-sided flank pain without any UTI-like symptoms.  The pain is constant, with no specific aggravating or relieving factors.  She had gone to ER at med center and had a CT abdomen pelvis which was reassuring.  Thereafter she has seen her PCP, they gave her Bentyl.  But despite taking Bentyl, her pain has not improved.  Infection thinks that the pain might of worsened.  Patient has nausea, no recent emesis.  She had some loose bowel movement, but no watery stools or blood in the stool.  No history of similar symptoms in the past.  It also appears to her, that there is some mid thoracic back pain, which prompted her to come to the emergency room.  Patient has seen her GI doctors in the past.  She was cleared with colonoscopy last year.  She also has family history of ovarian cancer.  She is status post hysterectomy, but her ovaries are still present.  She also had a pulmonary mass for which she is supposed to get PET scan on Friday.  Home Medications Prior to Admission medications   Medication Sig Start Date End Date Taking? Authorizing Provider  ondansetron (ZOFRAN-ODT) 8 MG disintegrating tablet Take 1 tablet (8 mg total) by mouth every 8 (eight) hours as needed for nausea. 02/14/23  Yes Derwood Kaplan, MD  sucralfate (CARAFATE) 1 g tablet Take 1 tablet (1 g total) by mouth 4 (four) times daily -  with meals and at bedtime. 02/14/23  Yes Derwood Kaplan, MD  ALPRAZolam (XANAX) 0.25 MG  tablet Take 1 tablet (0.25 mg total) by mouth every 8 (eight) hours as needed for anxiety. 04/05/17   Harrington Challenger, NP  atorvastatin (LIPITOR) 20 MG tablet Take 1 tablet (20 mg total) by mouth daily. 12/08/22 03/08/23  Swinyer, Zachary George, NP  denosumab (PROLIA) 60 MG/ML SOLN injection Inject 60 mg into the skin every 6 (six) months. Administer in upper arm, thigh, or abdomen 03/05/15   Harrington Challenger, NP  escitalopram (LEXAPRO) 20 MG tablet Take 20 mg by mouth daily.    [provider]  methocarbamol (ROBAXIN) 500 MG tablet Take 250 mg by mouth daily as needed for muscle spasms.    [provider]  rOPINIRole (REQUIP) 1 MG tablet Take 1 tablet (1 mg total) by mouth at bedtime. 11/17/22   Dohmeier, Porfirio Mylar, MD  Vilazodone HCl 20 MG TABS Take 1/2 tablet by mouth for 7 days, then one whole tablet daily. Must take with food. 02/01/23   Joan Flores, NP  warfarin (COUMADIN) 1 MG tablet Take 1/2 tablet to 1 tablet by mouth daily or as directed by Anticoagulation Clinic. 06/02/22   Quintella Reichert, MD      Allergies    Sulfa antibiotics    Review of Systems   Review of Systems  All other systems reviewed and are negative.   Physical Exam Updated Vital Signs BP Marland Kitchen)  161/69 (BP Location: Right Arm)   Pulse (!) 53   Temp 98 F (36.7 C) (Oral)   Resp 18   Ht 5\' 2"  (1.575 m)   Wt 54 kg   SpO2 100%   BMI 21.77 kg/m  Physical Exam Vitals and nursing note reviewed.  Constitutional:      Appearance: She is well-developed.  HENT:     Head: Atraumatic.  Cardiovascular:     Rate and Rhythm: Normal rate.  Pulmonary:     Effort: Pulmonary effort is normal.  Abdominal:     General: Abdomen is flat.     Palpations: Abdomen is soft.     Tenderness: There is abdominal tenderness in the epigastric area and left upper quadrant.     Comments: Left flank tenderness  Musculoskeletal:     Cervical back: Normal range of motion and neck supple.  Skin:    General: Skin is warm and dry.   Neurological:     Mental Status: She is alert and oriented to person, place, and time.     ED Results / Procedures / Treatments   Labs (all labs ordered are listed, but only abnormal results are displayed) Labs Reviewed  BASIC METABOLIC PANEL - Abnormal; Notable for the following components:      Result Value   Glucose, Bld 103 (*)    Calcium 8.7 (*)    All other components within normal limits  URINALYSIS, W/ REFLEX TO CULTURE (INFECTION SUSPECTED) - Abnormal; Notable for the following components:   Hgb urine dipstick SMALL (*)    Leukocytes,Ua TRACE (*)    Bacteria, UA RARE (*)    All other components within normal limits  CBC WITH DIFFERENTIAL/PLATELET  LIPASE, BLOOD  TROPONIN I (HIGH SENSITIVITY)    EKG None  Radiology No results found.  Procedures Procedures    Medications Ordered in ED Medications  amoxicillin-clavulanate (AUGMENTIN) 875-125 MG per tablet 1 tablet (has no administration in time range)  ketorolac (TORADOL) 30 MG/ML injection 15 mg (15 mg Intravenous Given 02/14/23 0944)  morphine (PF) 4 MG/ML injection 6 mg (6 mg Intravenous Given 02/14/23 1354)  sucralfate (CARAFATE) tablet 1 g (1 g Oral Given 02/14/23 1354)  pantoprazole (PROTONIX) injection 40 mg (40 mg Intravenous Given 02/14/23 1354)    ED Course/ Medical Decision Making/ A&P Clinical Course as of 02/14/23 1543  Tue Feb 14, 2023  1306 Patient's lab results reviewed.  She has no evidence of hematuria.  No leukocytosis.  No renal failure.  I went to reassess the patient.  She received Toradol, but has not helped her with her pain.  Patient states that she has 2 separate pains going on.  She has a left flank pain and also pain in her torso both front and back.  That pain is separate.  That pain is more like a burning pain and it is intermittent.  There is no evidence of any rash.  I went over the results with the patient.  I indicated to her that with the urine being normal I do not think  this is kidney stone.  Additionally, Toradol would have helped if this was kidney stone.  I informed her that I do not want to rush and just order more studies without having a good idea of what we are testing.  I reviewed her records, she has had CT angio PE this year and also CT coronary this year which were reassuring.  She also has had ultrasound of the pelvis earlier  which revealed no evidence of ovarian cancer.  Given all of this information we wanted to see what the labs look like and reassess after pain medicine to see if we need to expand our workup.  Based on information patient is providing me.  Will give her Protonix, Carafate and morphine.  I will reassess her.  I will get EKG and troponin as patient had voiced that nobody has listened to her heart or checked her for heart attack.  However I explained to the patient why I did not pursue that workup based on clinical gestalt and her previous workup.  Will get a troponin for at least the peace of mind along with EKG.  Will give her medicine and then reassess.  If she is still not feeling better, then I think we will have to proceed with CT scan.  She is due for PET scan in February.  Differential diagnosis would also include paraneoplastic process at this time along with peptic ulcer disease/gastritis. [AN]    Clinical Course User Index [AN] Derwood Kaplan, MD                             Medical Decision Making Amount and/or Complexity of Data Reviewed Labs: ordered.  Risk Prescription drug management.   This patient presents to the ED with chief complaint(s) of left-sided abdominal pain, midthoracic pain with pertinent past medical history of hysterectomy, DVT/PE on Coumadin and chronic GERD.The complaint involves an extensive differential diagnosis and also carries with it a high risk of complications and morbidity.    The differential diagnosis includes : Nephrolithiasis, renal colic, splenomegaly, pancreatitis, peptic ulcer  disease, esophageal spasms.  Less likely to be dissection, ACS or PE.  No pleuritic component, unlikely to be a pulmonary issue.  No evidence of rash, low concerns for zoster.  The initial plan is to get basic labs and reassess the patient.  She was seen in the ER recently, just 3 days ago and had a CT scan that was normal.  She has had CT PE, CT coronary this year which were reassuring.  She also had an ultrasound pelvis that revealed normal ovaries last time.  Hepatobiliary process also normal on the last CT scan.   Additional history obtained: Records reviewed previous admission documents and previous CT scan, CT pulmonary and CT coronary.  I also reviewed patient's upper and lower endoscopy.  Upper endoscopy was normal 2 years back.  Independent labs interpretation:  The following labs were independently interpreted: CBC, BMP, UA are all normal.  LFT are also fine.    Treatment and Reassessment: Patient reassessed at 3:30 PM.  She did not feel significantly better with Carafate and morphine.  Discussed with her that we can proceed with an CT renal stone, but I think it is a low yield study.  Patient is questioning if she can have a dizziness related pain or endometriosis related pain.  I indicated that endometriosis is unlikely to be the cause.  Addition pain will be diagnosis of exclusion.  She also question if diverticulitis is a possibility.  I shared with her that her colonoscopy did not reveal diverticulosis and CT scan was fine, however it is not a unreasonable idea to just put her on Augmentin for few days to see if that helps.  Plan is for patient to be discharged.  She is requesting that she can see GI sooner.  I will send a message to Dr.  Vreeland, who will try to coordinate a closer follow-up with Dr. Marca Ancona.  The patient appears reasonably screened and/or stabilized for discharge and I doubt any other medical condition or other Outpatient [facility] requiring further screening, evaluation, or  treatment in the ED at this time prior to discharge.   Results from the ER workup discussed with the patient face to face and all questions answered to the best of my ability. The patient is safe for discharge with strict return precautions.   Final Clinical Impression(s) / ED Diagnoses Final diagnoses:  Left sided abdominal pain of unknown cause  Diverticulitis    Rx / DC Orders ED Discharge Orders          Ordered    sucralfate (CARAFATE) 1 g tablet  3 times daily with meals & bedtime        02/14/23 1527    ondansetron (ZOFRAN-ODT) 8 MG disintegrating tablet  Every 8 hours PRN        02/14/23 1527              Derwood Kaplan, MD 02/14/23 1547

## 2023-02-14 NOTE — ED Triage Notes (Signed)
Pt reports left lower abd/flank pain x 4 days. Denies N/V

## 2023-02-15 ENCOUNTER — Ambulatory Visit: Payer: PPO | Attending: Cardiology

## 2023-02-15 ENCOUNTER — Ambulatory Visit: Payer: PPO | Admitting: Nurse Practitioner

## 2023-02-15 DIAGNOSIS — Z5181 Encounter for therapeutic drug level monitoring: Secondary | ICD-10-CM

## 2023-02-15 LAB — POCT INR: INR: 2.7 (ref 2.0–3.0)

## 2023-02-15 NOTE — Patient Instructions (Signed)
Description   Continue taking Warfarin 1 tablet daily except for 1/2 tablet on Sundays, Tuesdays, and Thursdays.  Recheck INR 2 weeks.   Coumadin Clinic [phone]

## 2023-02-16 ENCOUNTER — Telehealth: Payer: Self-pay | Admitting: Cardiology

## 2023-02-16 ENCOUNTER — Other Ambulatory Visit: Payer: Self-pay | Admitting: *Deleted

## 2023-02-16 ENCOUNTER — Ambulatory Visit: Payer: PPO | Attending: Internal Medicine | Admitting: Pharmacist

## 2023-02-16 DIAGNOSIS — E785 Hyperlipidemia, unspecified: Secondary | ICD-10-CM | POA: Diagnosis not present

## 2023-02-16 NOTE — Progress Notes (Unsigned)
Patient ID: Emma Lopez                 DOB: [birth year]                    MRN: [id number]      HPI: Emma Lopez is a 77 y.o. female patient referred to lipid clinic by Emma Bridegroom, NP. PMH is significant for recurrent DVT/PE with positive lupus anticoagulant (followed by Dr. Myna Hidalgo), HLD, sinus bradycardia, GERD, intermittent LBBB, hyperthyroidism and anxiety.  CTA with FFR done in 2024 for chest pain. CAC 1 (29th percentile), CT FFR analysis did not show any significant stenosis. There is visual stenosis at ostial LAD, but FFR is not significant.  IMPRESSION: 1. Moderate stenosis, CADRADS = 3. Ostial LAD with minimal plaque but visual 50-69% stenosis. CT FFR will be performed and reported separately. Maximal stenosis 1-24% in LCX and RCA.   2. Coronary calcium score of 1. This was 29th percentile for age-, sex-, and race- matched controls.   3. Total plaque volume 12 mm3 which is <10th percentile for age- and sex- matched controls (calcified plaque 0 mm3; noncalcified plaque 12 mm3). TPV is mild.   4. Normal coronary origin with right dominance.   INTERPRETATION:   CAD-RADS 3: Moderate stenosis (50-69%). Consider symptom-guided anti-ischemic pharmacotherapy as well as risk factor modification per guideline directed care. Additional analysis with CT FFR will be submitted.  Patient presents today to lipid clinic. She report she stopped atorvastatin 6/17. Feels a lot better. She is interested in Repatha. Knows people who have taken. She states she is pre- diabetic. Ready to take her health seriously. Lost 10 lb by cutting back on sweets.   Wants to talk about several health issues, osteoporosis, her GI symptoms, anxiety.   Current Medications: none Intolerances: rosuvastatin 5mg  (myalgias, fatigue), atorvastatin 20mg  (decreased energy) Risk Factors: CAD on CT LDL-C goal: <70  Diet: chicken and fish Dark chocolate Almonds, walnuts  Exercise: use to do yoga  Family  History:  Family History  Problem Relation Age of Onset   Depression Mother    Anxiety disorder Mother    Hypertension Mother    Breast cancer Mother        41's   Cancer Father        COLON   Depression Sister    Anxiety disorder Sister    Hypertension Sister    Stroke Sister    Breast cancer Maternal Aunt        Age 5's   Cancer Maternal Aunt        Melanoma   Alzheimer's disease Maternal Aunt    Breast cancer Maternal Aunt        70's   Cancer Maternal Aunt        Colon CA   Alzheimer's disease Maternal Aunt    Cancer Paternal Aunt        OVARIAN and COLON     Social History:  Social History   Socioeconomic History   Marital status: Single    Spouse name: Not on file   Number of children: 0   Years of education: 16   Highest education level: Bachelor's degree (e.g., BA, AB, BS)  Occupational History   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  Tobacco Use   Smoking status: Former    Current packs/day: 0.00    Average packs/day: 0.1 packs/day for 6.0 years (0.6 ttl pk-yrs)    Types: Cigarettes    Start  date: 08/01/1972    Quit date: 08/01/1978    Years since quitting: 44.5   Smokeless tobacco: Never   Tobacco comments:    only smoked 4-6 yrs 1 pack per month  Vaping Use   Vaping status: Never Used  Substance and Sexual Activity   Alcohol use: Yes    Comment: 1 drink per month   Drug use: No   Sexual activity: Not Currently    Birth control/protection: Surgical    Comment: HYSTERECTOMY-1st intercourse 27-Fewer than 5 partners  Other Topics Concern   Not on file  Social History Narrative   Lives alone in [place].    Social Determinants of Health   Financial Resource Strain: Not on file  Food Insecurity: Not on file  Transportation Needs: Not on file  Physical Activity: Not on file  Stress: Not on file  Social Connections: Not on file  Intimate Partner Violence: Not on file     Labs: Lipid Panel  04/25/22 TC 221, TG 107, HDL 55, LDL-C  145 non HDL 166  Past Medical History:  Diagnosis Date   Abnormal EKG    Anxiety    Arthritis    oa   Bradycardia 12/14/2017   CIN III (cervical intraepithelial neoplasia III) 2000   Complication of anesthesia    did well last 2 times with procedures   Depression    DES exposure in utero    DVT (deep venous thrombosis) (HCC) 01/2009   LEFT LEG   DVT (deep venous thrombosis) (HCC) 10/30/2020   Dyspnea on exertion 10/2017   Elevated triglycerides with high cholesterol    Endometriosis    Fibromyalgia    GERD (gastroesophageal reflux disease)    History of colon polyps    History of hiatal hernia    told by some md has, some say not   IBS (irritable bowel syndrome) 2015   Left bundle branch block    intermittent   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 30 tablet 0   methocarbamol (ROBAXIN) 500 MG tablet Take 250 mg by mouth daily as needed for muscle spasms.     ondansetron (ZOFRAN-ODT) 8 MG disintegrating tablet Take 1 tablet (8 mg total) by mouth every 8 (eight) hours as needed for nausea. 20 tablet 0   rOPINIRole (REQUIP) 1 MG tablet Take 1 tablet (1 mg total) by mouth at bedtime. 90 tablet 2   sucralfate (CARAFATE) 1 g tablet Take 1 tablet (1 g total) by mouth 4 (four) times daily -  with meals and at bedtime. 60 tablet 0   Vilazodone HCl 20 MG TABS Take 1/2 tablet by mouth for 7 days, then one  whole tablet daily. Must take with food. 30 tablet 1   warfarin (COUMADIN) 1 MG tablet Take 1/2 tablet to 1 tablet by mouth daily or  as directed by Anticoagulation Clinic. 90 tablet 1   denosumab (PROLIA) 60 MG/ML SOLN injection Inject 60 mg into the skin every 6 (six) months. Administer in upper arm, thigh, or abdomen (Patient not taking: Reported on 02/16/2023) 180 mL 2   Current Facility-Administered Medications on File Prior to Visit  Medication Dose Route Frequency Provider Last Rate Last Admin   denosumab (PROLIA) injection 60 mg  60 mg Subcutaneous Once Patton Salles, MD        Allergies  Allergen Reactions   Sulfa Antibiotics Rash and Other (See Comments)    Assessment/Plan:  1. Hyperlipidemia -  Hyperlipidemia Assessment: LDL-C is above goal of <70 Patient did not tolerate rosuvastatin 5mg  or atorvastatin 20mg  Feels much better off statin Not much physical activity I recommended strength training for her osteoporosis Per patient, cost is not an issue with Repatha Recommended working on anxiety- can be playing a big toll on her health. She does have a psychologist   Plan: Will submit a PA for Repatha Follow up with patient after determination  PA submitted Key: [id number] Medication approved.   Thank you,  Olene Floss, Pharm.D, BCACP, BCPS, CPP [facility] A Division of [facility] [address], [place], [place] [address]  Phone: [phone]; Fax: [phone]

## 2023-02-16 NOTE — Telephone Encounter (Signed)
S/w pt today to move Eligha Bridegroom, NP appt up and pt was very concerned about lipids.  Offered appt today with Pharmacist, pt is in town and will take appt today at 3:35.  If pt needs to move up appt will discuss today. Referral placed in system. Will send to [facility] to [place].

## 2023-02-16 NOTE — Assessment & Plan Note (Signed)
Assessment: LDL-C is above goal of <70 Patient did not tolerate rosuvastatin 5mg  or atorvastatin 20mg  Feels much better off statin Not much physical activity I recommended strength training for her osteoporosis Per patient, cost is not an issue with Repatha Recommended working on anxiety- can be playing a big toll on her health. She does have a psychologist   Plan: Will submit a PA for Repatha Follow up with patient after determination

## 2023-02-16 NOTE — Patient Instructions (Signed)
I will submit a prior authorization for Repatha. I will call you once I hear back. Please call me at [phone] with any questions.   Repatha is a cholesterol medication that improved your body's ability to get rid of "bad cholesterol" known as LDL. It can lower your LDL up to 60%! It is an injection that is given under the skin every 2 weeks. The medication often requires a prior authorization from your insurance company. We will take care of submitting all the necessary information to your insurance company to get it approved. The most common side effects of Repatha include runny nose, symptoms of the common cold, rarely flu or flu-like symptoms, back/muscle pain in about 3-4% of the patients, and redness, pain, or bruising at the injection site. Tell your healthcare provider if you have any side effect that bothers you or that does not go away.     

## 2023-02-16 NOTE — Telephone Encounter (Signed)
Patient returned staff call. 

## 2023-02-16 NOTE — Telephone Encounter (Signed)
LVM for pt to call office to get sooner appt with Lebron Conners.

## 2023-02-17 ENCOUNTER — Encounter (HOSPITAL_COMMUNITY)
Admission: RE | Admit: 2023-02-17 | Discharge: 2023-02-17 | Disposition: A | Discharge status: Home / Self Care | Payer: PPO | Source: Ambulatory Visit | Attending: Internal Medicine | Admitting: Internal Medicine

## 2023-02-17 ENCOUNTER — Telehealth: Payer: Self-pay | Admitting: Pharmacist

## 2023-02-17 DIAGNOSIS — R911 Solitary pulmonary nodule: Secondary | ICD-10-CM | POA: Diagnosis not present

## 2023-02-17 DIAGNOSIS — R932 Abnormal findings on diagnostic imaging of liver and biliary tract: Secondary | ICD-10-CM | POA: Diagnosis not present

## 2023-02-17 DIAGNOSIS — E785 Hyperlipidemia, unspecified: Secondary | ICD-10-CM

## 2023-02-17 LAB — GLUCOSE, CAPILLARY: Glucose-Capillary: 100 mg/dL — ABNORMAL HIGH (ref 70–99)

## 2023-02-17 MED ORDER — FLUDEOXYGLUCOSE F - 18 (FDG) INJECTION
5.6000 | Freq: Once | INTRAVENOUS | Status: AC | PRN
  Administered 2023-02-17: 6.7 via INTRAVENOUS

## 2023-02-17 MED ORDER — REPATHA SURECLICK 140 MG/ML ~~LOC~~ SOAJ: 1.0000 mL | SUBCUTANEOUS | 11 refills | Status: DC

## 2023-02-17 NOTE — Telephone Encounter (Signed)
PA for Repatha approved. Called pt and left detailed message per DPR. Requested a call back to find out what pharmacy she would like it sent to and if she wants a 90 days supply or 30. Will also need repeat labs scheduled.

## 2023-02-17 NOTE — Telephone Encounter (Signed)
Spoke with patient. She will get repeat labs at [facility] office after she has given at least 4 injections. Orders placed. Rx sent to [facility] for 30 days per pt request

## 2023-02-20 DIAGNOSIS — Z1231 Encounter for screening mammogram for malignant neoplasm of breast: Secondary | ICD-10-CM | POA: Diagnosis not present

## 2023-02-22 ENCOUNTER — Encounter: Payer: Self-pay | Admitting: Obstetrics and Gynecology

## 2023-03-01 ENCOUNTER — Encounter: Payer: Self-pay | Admitting: Obstetrics and Gynecology

## 2023-03-01 NOTE — Progress Notes (Deleted)
GYNECOLOGY  VISIT   HPI: 77 y.o.   Single  Caucasian  female   G0P0 with No LMP recorded. Patient has had a hysterectomy.   here for   hot flashes  GYNECOLOGIC HISTORY: No LMP recorded. Patient has had a hysterectomy. Contraception:  hyst Menopausal hormone therapy:  n/a Last mammogram:  02/20/23 Breast Density Category A, BI-RADS CATEGORY 1 Neg  Last pap smear:   09/28/22 neg: HR HPV neg,  09/27/21 benign glandular cells of the vagina: HR HPV neg: biopsy showed benign fibroepithelial polyp, 08/19/20 neg         OB History     Gravida  0   Para  0   Term      Preterm      AB      Living         SAB      IAB      Ectopic      Multiple      Live Births                 Patient Active Problem List   Diagnosis Date Noted   Hyperlipidemia 02/16/2023   Atrial tachycardia 11/16/2021   Chronic fatigue and malaise 11/16/2021   Encounter for therapeutic drug monitoring 02/02/2021   Acute deep vein thrombosis (DVT) of right lower extremity, unspecified vein (HCC) 01/25/2021   Aortic atherosclerosis (HCC) 11/17/2020   Acute DVT (deep venous thrombosis) (HCC) 11/13/2020   Depression with anxiety 11/13/2020   RLS (restless legs syndrome) 01/07/2020   Insomnia due to medical condition 01/07/2020   Nocturia more than twice per night 01/07/2020   Bradycardia 12/14/2017   Abnormal EKG    GERD (gastroesophageal reflux disease)    Dyspnea on exertion 10/30/2017   Family history of ovarian cancer 04/17/2017   Family history of breast cancer 04/17/2017   Family history of colon cancer 04/17/2017   Family history of pancreatic cancer 04/17/2017   Family history of melanoma 04/17/2017   Genetic testing 04/17/2017   CIN III (cervical intraepithelial neoplasia III)    Arthritis    Fibromyalgia    Chronic pulmonary embolism (HCC)    Thyroid disease    Osteoporosis    DVT (deep venous thrombosis) (HCC) 04/01/2004    Past Medical History:  Diagnosis Date   Abnormal EKG     Anxiety    Arthritis    oa   Bradycardia 12/14/2017   CIN III (cervical intraepithelial neoplasia III) 2000   Complication of anesthesia    did well last 2 times with procedures   Depression    DES exposure in utero    DVT (deep venous thrombosis) (HCC) 01/2009   LEFT LEG   DVT (deep venous thrombosis) (HCC) 10/30/2020   Dyspnea on exertion 10/2017   Elevated triglycerides with high cholesterol    Endometriosis    Fibromyalgia    GERD (gastroesophageal reflux disease)    History of colon polyps    History of hiatal hernia    told by some md has, some say not   IBS (irritable bowel syndrome) 2015   Left bundle branch block    intermittent   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   Lupus anticoagulant disorder (HCC)    Multinodular goiter    Osteoporosis 12/2017   T score -3.2 stable from prior study   PE (pulmonary embolism)    Pneumonia 03/01/2021   PONV (postoperative nausea and vomiting)  Restless legs syndrome    Sinus bradycardia    Sleep disorder    Thyroid disease    HYPERTHYROIDISM   Vitamin D deficiency     Past Surgical History:  Procedure Laterality Date   ABDOMINAL HYSTERECTOMY  2001   TAH, partial   APPENDECTOMY  1978   COLONOSCOPY WITH PROPOFOL N/A 12/07/2015   Procedure: COLONOSCOPY WITH PROPOFOL;  Surgeon: Charolett Bumpers, MD;  Location: [facility];  Service: Endoscopy;  Laterality: N/A;   COLONOSCOPY WITH PROPOFOL N/A 01/11/2022   Procedure: COLONOSCOPY WITH PROPOFOL;  Surgeon: Kerin Salen, MD;  Location: [facility];  Service: Gastroenterology;  Laterality: N/A;   colonscopy  7 yrs ago   other in past   ESOPHAGOGASTRODUODENOSCOPY (EGD) WITH PROPOFOL N/A 12/07/2015   Procedure: ESOPHAGOGASTRODUODENOSCOPY (EGD) WITH PROPOFOL;  Surgeon: Charolett Bumpers, MD;  Location: [facility];  Service: Endoscopy;  Laterality: N/A;   ESOPHAGOGASTRODUODENOSCOPY ENDOSCOPY  yrs ago   FACIAL COSMETIC SURGERY     GYNECOLOGIC  CRYOSURGERY     HEMOSTASIS CLIP PLACEMENT  01/11/2022   Procedure: HEMOSTASIS CLIP PLACEMENT;  Surgeon: Kerin Salen, MD;  Location: [facility];  Service: Gastroenterology;;   HOT HEMOSTASIS N/A 01/11/2022   Procedure: HOT HEMOSTASIS (ARGON PLASMA COAGULATION/BICAP);  Surgeon: Kerin Salen, MD;  Location: [facility];  Service: Gastroenterology;  Laterality: N/A;   MYOMECTOMY     POLYPECTOMY  01/11/2022   Procedure: POLYPECTOMY;  Surgeon: Kerin Salen, MD;  Location: [facility];  Service: Gastroenterology;;   REPLACEMENT TOTAL JOINT WRIST W/ PROSTHETIC IMPLANT Right 08/01/2021   TONSILLECTOMY      Current Outpatient Medications  Medication Sig Dispense Refill   ALPRAZolam (XANAX) 0.25 MG tablet Take 1 tablet (0.25 mg total) by mouth every 8 (eight) hours as needed for anxiety. 30 tablet 0   denosumab (PROLIA) 60 MG/ML SOLN injection Inject 60 mg into the skin every 6 (six) months. Administer in upper arm, thigh, or abdomen (Patient not taking: Reported on 02/16/2023) 180 mL 2   Evolocumab (REPATHA SURECLICK) 140 MG/ML SOAJ Inject 140 mg into the skin every 14 (fourteen) days. 2 mL 11   methocarbamol (ROBAXIN) 500 MG tablet Take 250 mg by mouth daily as needed for muscle spasms.     ondansetron (ZOFRAN-ODT) 8 MG disintegrating tablet Take 1 tablet (8 mg total) by mouth every 8 (eight) hours as needed for nausea. 20 tablet 0   rOPINIRole (REQUIP) 1 MG tablet Take 1 tablet (1 mg total) by mouth at bedtime. 90 tablet 2   sucralfate (CARAFATE) 1 g tablet Take 1 tablet (1 g total) by mouth 4 (four) times daily -  with meals and at bedtime. 60 tablet 0   Vilazodone HCl 20 MG TABS Take 1/2 tablet by mouth for 7 days, then one whole tablet daily. Must take with food. 30 tablet 1   warfarin (COUMADIN) 1 MG tablet Take 1/2 tablet to 1 tablet by mouth daily or as directed by Anticoagulation Clinic. 90 tablet 1   Current Facility-Administered Medications  Medication Dose Route Frequency Provider Last Rate  Last Admin   denosumab (PROLIA) injection 60 mg  60 mg Subcutaneous Once Patton Salles, MD         ALLERGIES: Sulfa antibiotics  Family History  Problem Relation Age of Onset   Depression Mother    Anxiety disorder Mother    Hypertension Mother    Breast cancer Mother        44's   Cancer Father  COLON   Depression Sister    Anxiety disorder Sister    Hypertension Sister    Stroke Sister    Breast cancer Maternal Aunt        Age 63's   Cancer Maternal Aunt        Melanoma   Alzheimer's disease Maternal Aunt    Breast cancer Maternal Aunt        70's   Cancer Maternal Aunt        Colon CA   Alzheimer's disease Maternal Aunt    Cancer Paternal Aunt        OVARIAN and COLON    Social History   Socioeconomic History   Marital status: Single    Spouse name: Not on file   Number of children: 0   Years of education: 16   Highest education level: Bachelor's degree (e.g., BA, AB, BS)  Occupational History   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  Tobacco Use   Smoking status: Former    Current packs/day: 0.00    Average packs/day: 0.1 packs/day for 6.0 years (0.6 ttl pk-yrs)    Types: Cigarettes    Start date: 08/01/1972    Quit date: 08/01/1978    Years since quitting: 44.6   Smokeless tobacco: Never   Tobacco comments:    only smoked 4-6 yrs 1 pack per month  Vaping Use   Vaping status: Never Used  Substance and Sexual Activity   Alcohol use: Yes    Comment: 1 drink per month   Drug use: No   Sexual activity: Not Currently    Birth control/protection: Surgical    Comment: HYSTERECTOMY-1st intercourse 27-Fewer than 5 partners  Other Topics Concern   Not on file  Social History Narrative   Lives alone in [place].    Social Determinants of Health   Financial Resource Strain: Not on file  Food Insecurity: Not on file  Transportation Needs: Not on file  Physical Activity: Not on file  Stress: Not on file  Social Connections: Not  on file  Intimate Partner Violence: Not on file    Review of Systems  PHYSICAL EXAMINATION:    There were no vitals taken for this visit.    General appearance: alert, cooperative and appears stated age Head: Normocephalic, without obvious abnormality, atraumatic Neck: no adenopathy, supple, symmetrical, trachea midline and thyroid normal to inspection and palpation Lungs: clear to auscultation bilaterally Breasts: normal appearance, no masses or tenderness, No nipple retraction or dimpling, No nipple discharge or bleeding, No axillary or supraclavicular adenopathy Heart: regular rate and rhythm Abdomen: soft, non-tender, no masses,  no organomegaly Extremities: extremities normal, atraumatic, no cyanosis or edema Skin: Skin color, texture, turgor normal. No rashes or lesions Lymph nodes: Cervical, supraclavicular, and axillary nodes normal. No abnormal inguinal nodes palpated Neurologic: Grossly normal  Pelvic: External genitalia:  no lesions              Urethra:  normal appearing urethra with no masses, tenderness or lesions              Bartholins and Skenes: normal                 Vagina: normal appearing vagina with normal color and discharge, no lesions              Cervix: no lesions                Bimanual Exam:  Uterus:  normal size, contour, position, consistency,  mobility, non-tender              Adnexa: no mass, fullness, tenderness              Rectal exam: {yes no:[id number]}.  Confirms.              Anus:  normal sphincter tone, no lesions  Chaperone was present for exam:  ***  ASSESSMENT     PLAN     An After Visit Summary was printed and given to the patient.  ______ minutes face to face time of which over 50% was spent in counseling.

## 2023-03-01 NOTE — Telephone Encounter (Signed)
Call placed to patient, left detailed message recommending OV with Dr. Edward Jolly to further discuss. Return call to office at [phone], option 1 for appts.   AEX 09/28/22  Routing to provider for final review.  Will close encounter.

## 2023-03-03 DIAGNOSIS — R0789 Other chest pain: Secondary | ICD-10-CM | POA: Diagnosis not present

## 2023-03-03 DIAGNOSIS — K5732 Diverticulitis of large intestine without perforation or abscess without bleeding: Secondary | ICD-10-CM | POA: Diagnosis not present

## 2023-03-03 DIAGNOSIS — R0602 Shortness of breath: Secondary | ICD-10-CM | POA: Diagnosis not present

## 2023-03-03 DIAGNOSIS — R110 Nausea: Secondary | ICD-10-CM | POA: Diagnosis not present

## 2023-03-03 DIAGNOSIS — R079 Chest pain, unspecified: Secondary | ICD-10-CM | POA: Diagnosis not present

## 2023-03-03 DIAGNOSIS — K5792 Diverticulitis of intestine, part unspecified, without perforation or abscess without bleeding: Secondary | ICD-10-CM | POA: Diagnosis not present

## 2023-03-06 ENCOUNTER — Ambulatory Visit: Payer: PPO | Admitting: Behavioral Health

## 2023-03-06 ENCOUNTER — Ambulatory Visit: Payer: PPO

## 2023-03-08 ENCOUNTER — Ambulatory Visit: Payer: PPO | Admitting: Behavioral Health

## 2023-03-08 ENCOUNTER — Encounter: Payer: Self-pay | Admitting: Behavioral Health

## 2023-03-08 ENCOUNTER — Ambulatory Visit: Payer: PPO | Admitting: Obstetrics and Gynecology

## 2023-03-08 DIAGNOSIS — F331 Major depressive disorder, recurrent, moderate: Secondary | ICD-10-CM | POA: Diagnosis not present

## 2023-03-08 DIAGNOSIS — R4589 Other symptoms and signs involving emotional state: Secondary | ICD-10-CM

## 2023-03-08 DIAGNOSIS — F411 Generalized anxiety disorder: Secondary | ICD-10-CM | POA: Diagnosis not present

## 2023-03-08 MED ORDER — HYDROXYZINE HCL 10 MG PO TABS
10.0000 mg | ORAL_TABLET | Freq: Three times a day (TID) | ORAL | 1 refills | Status: DC | PRN
Start: 2023-03-08 — End: 2023-04-02

## 2023-03-08 MED ORDER — VILAZODONE HCL 20 MG PO TABS
ORAL_TABLET | ORAL | 1 refills | Status: DC
Start: 2023-03-08 — End: 2023-05-08

## 2023-03-08 NOTE — Progress Notes (Signed)
[facility]  Patient ID: Emma Lopez,  MRN: [id number]  PCP: Melida Quitter, MD  Date of Evaluation: 03/08/2023 Time spent:30 minutes  Chief Complaint:  Chief Complaint   Anxiety; Depression; Patient Education; Follow-up; Medication Problem; Stress; Loneliness      HISTORY/CURRENT STATUS: HPI "[name]", 77 year old female presents to this office for initial visit and to establish care.  Collateral information should be considered reliable.  Patient is very articulate, calm, and pleasant.  She is here today on referral from her PCP to consult with psychiatry.  Says that she is very frustrated because she has had multiple diagnostic testing that does not reveal the source of her continued chronic fatigue.  She also reports moderate levels of anxiety and depression that is currently not controlled with her Lexapro.  She says that she is currently living alone in [facility] and that her significant other passed away approximately 2 years ago.  She states that she does not have many remaining family members except for a couple of cousins.  They hold healthcare power of attorney and would handle her affairs if necessary.  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  Says that she has suffered with anxiety and depression for 20+ years and has been taking Lexapro for 10+ years.  She feels like the medication is not working well enough but also has concerns about the source of fatigue.  She reports depression today at 5/10, and anxiety at 6/10.  PHQ-9 score a 15.  She is sleeping 7 to 8 hours per night.  Her MDQ was grossly negative with no criterion on marked yes.  Says that she would like to consider medication changes today that may help. She denies any history of mania, no psychosis, no auditory or visual hallucinations, no delirium.  No current suicidal ideation or homicidal ideation.   Past psychiatric medication trials:  Lexapro Cymbalta BuSpar   Individual Medical  History/ Review of Systems: Changes? :No   Allergies: Sulfa antibiotics  Current Medications:  Current Outpatient Medications:    hydrOXYzine (ATARAX) 10 MG tablet, Take 1 tablet (10 mg total) by mouth 3 (three) times daily as needed., Disp: 90 tablet, Rfl: 1   ALPRAZolam (XANAX) 0.25 MG tablet, Take 1 tablet (0.25 mg total) by mouth every 8 (eight) hours as needed for anxiety., Disp: 30 tablet, Rfl: 0   denosumab (PROLIA) 60 MG/ML SOLN injection, Inject 60 mg into the skin every 6 (six) months. Administer in upper arm, thigh, or abdomen (Patient not taking: Reported on 02/16/2023), Disp: 180 mL, Rfl: 2   Evolocumab (REPATHA SURECLICK) 140 MG/ML SOAJ, Inject 140 mg into the skin every 14 (fourteen) days., Disp: 2 mL, Rfl: 11   methocarbamol (ROBAXIN) 500 MG tablet, Take 250 mg by mouth daily as needed for muscle spasms., Disp: , Rfl:    ondansetron (ZOFRAN-ODT) 8 MG disintegrating tablet, Take 1 tablet (8 mg total) by mouth every 8 (eight) hours as needed for nausea., Disp: 20 tablet, Rfl: 0   rOPINIRole (REQUIP) 1 MG tablet, Take 1 tablet (1 mg total) by mouth at bedtime., Disp: 90 tablet, Rfl: 2   sucralfate (CARAFATE) 1 g tablet, Take 1 tablet (1 g total) by mouth 4 (four) times daily -  with meals and at bedtime., Disp: 60 tablet, Rfl: 0   Vilazodone HCl 20 MG TABS, Take one tablet by mouth daily, Disp: 30 tablet, Rfl: 1   warfarin (COUMADIN) 1 MG tablet, Take 1/2 tablet to 1 tablet by  mouth daily or as directed by Anticoagulation Clinic., Disp: 90 tablet, Rfl: 1  Current Facility-Administered Medications:    denosumab (PROLIA) injection 60 mg, 60 mg, Subcutaneous, Once, [name], MD Medication Side Effects: none  Family Medical/ Social History: Changes? No  MENTAL HEALTH EXAM:  There were no vitals taken for this visit.There is no height or weight on file to calculate BMI.  General Appearance: Casual, Neat, and Well Groomed  Eye Contact:  Good  Speech:  Clear and  Coherent  Volume:  Normal  Mood:  Anxious, Depressed, and Dysphoric  Affect:  Appropriate, Depressed, and Anxious  Thought Process:  Coherent  Orientation:  Full (Time, Place, and Person)  Thought Content: Logical   Suicidal Thoughts:  No  Homicidal Thoughts:  No  Memory:  WNL  Judgement:  Good  Insight:  Good  Psychomotor Activity:  Normal  Concentration:  Concentration: Good  Recall:  Good  Fund of Knowledge: Good  Language: Good  Assets:  Desire for Improvement  ADL's:  Intact  Cognition: WNL  Prognosis:  Good    DIAGNOSES:    ICD-10-CM   1. Loneliness  R45.89     2. Major depressive disorder, recurrent episode, moderate (HCC)  F33.1 hydrOXYzine (ATARAX) 10 MG tablet    Vilazodone HCl 20 MG TABS    3. Generalized anxiety disorder  F41.1 hydrOXYzine (ATARAX) 10 MG tablet    Vilazodone HCl 20 MG TABS      Receiving Psychotherapy: No    RECOMMENDATIONS:   Greater than 50% of 30 min face to face time with patient was spent on counseling and coordination of care. Talked about her continue problems with panic attacks and ER visit. Everything ok and believe are panic attacks. She is at high risk for loneliness. No family or remaining friends in the area. She is considering retirement community.  Only been on Viibryd 20 mg for 3 weeks. No changes this visit.  Recommended Mag Glycinate supplement at bedtime.    We agreed to:   Will continue Viibryd  20 mg daily  To start hydroxyzine 10 mg three time daily for anxiety or panic. Must drink plenty of water.  Will report side effects or worsening symptoms promptly Will follow-up in 4 weeks to reassess Provided emergency contact information Reviewed PDMP    Joan Flores, NP

## 2023-03-09 ENCOUNTER — Encounter: Payer: PPO | Admitting: Neurology

## 2023-03-09 ENCOUNTER — Other Ambulatory Visit: Payer: Self-pay | Admitting: Nurse Practitioner

## 2023-03-15 ENCOUNTER — Encounter: Payer: Self-pay | Admitting: Obstetrics and Gynecology

## 2023-03-15 ENCOUNTER — Ambulatory Visit: Payer: PPO | Admitting: Obstetrics and Gynecology

## 2023-03-15 VITALS — BP 118/76 | HR 57 | Ht 62.0 in | Wt 122.0 lb

## 2023-03-15 DIAGNOSIS — R232 Flushing: Secondary | ICD-10-CM

## 2023-03-15 DIAGNOSIS — M810 Age-related osteoporosis without current pathological fracture: Secondary | ICD-10-CM

## 2023-03-15 NOTE — Patient Instructions (Signed)
Calcium Content in Foods Calcium is the most abundant mineral in the body. Most of the body's calcium supply is stored in bones and teeth. Calcium helps many parts of the body function normally, including: Blood and blood vessels. Nerves. Hormones. Muscles. Bones and teeth. When your calcium stores are low, you may be at risk for low bone mass, bone loss, and broken bones (fractures). When you get enough calcium, it helps to support strong bones and teeth throughout your life. Calcium is especially important for: Children during growth spurts. Girls during adolescence. Women who are pregnant or breastfeeding. Women after their menstrual cycle stops (postmenopause). Women whose menstrual cycle has stopped due to anorexia nervosa or regular intense exercise. People who cannot eat or digest dairy products. Vegans. Recommended daily amounts of calcium: Women (ages 92 to 66): 1,000 mg per day. Women (ages 64 and older): 1,200 mg per day. Men (ages 41 to 52): 1,000 mg per day. Men (ages 54 and older): 1,200 mg per day. Women (ages 61 to 4): 1,300 mg per day. Men (ages 46 to 87): 1,300 mg per day. General information Eat foods that are high in calcium. Try to get most of your calcium from food. Some people may benefit from taking calcium supplements. Check with your health care provider or diet and nutrition specialist (dietitian) before starting any calcium supplements. Calcium supplements may interact with certain medicines. Too much calcium may cause other health problems, such as constipation and kidney stones. For the body to absorb calcium, it needs vitamin D. Sources of vitamin D include: Skin exposure to direct sunlight. Foods, such as egg yolks, liver, mushrooms, saltwater fish, and fortified milk. Vitamin D supplements. Check with your health care provider or dietitian before starting any vitamin D supplements. What foods are high in calcium?  Foods that are high in calcium contain  more than 100 milligrams per serving. Fruits Fortified orange juice or other fruit juice, 300 mg per 8 oz serving. Vegetables Collard greens, 360 mg per 8 oz serving. Kale, 100 mg per 8 oz serving. Bok choy, 160 mg per 8 oz serving. Grains Fortified ready-to-eat cereals, 100 to 1,000 mg per 8 oz serving. Fortified frozen waffles, 200 mg in 2 waffles. Oatmeal, 140 mg in 1 cup. Meats and other proteins Sardines, canned with bones, 325 mg per 3 oz serving. Salmon, canned with bones, 180 mg per 3 oz serving. Canned shrimp, 125 mg per 3 oz serving. Baked beans, 160 mg per 4 oz serving. Tofu, firm, made with calcium sulfate, 253 mg per 4 oz serving. Dairy Yogurt, plain, low-fat, 310 mg per 6 oz serving. Nonfat milk, 300 mg per 8 oz serving. American cheese, 195 mg per 1 oz serving. Cheddar cheese, 205 mg per 1 oz serving. Cottage cheese 2%, 105 mg per 4 oz serving. Fortified soy, rice, or almond milk, 300 mg per 8 oz serving. Mozzarella, part skim, 210 mg per 1 oz serving. The items listed above may not be a complete list of foods high in calcium. Actual amounts of calcium may be different depending on processing. Contact a dietitian for more information. What foods are lower in calcium? Foods that are lower in calcium contain 50 mg or less per serving. Fruits Apple, about 6 mg. Banana, about 12 mg. Vegetables Lettuce, 19 mg per 2 oz serving. Tomato, about 11 mg. Grains Rice, 4 mg per 6 oz serving. Boiled potatoes, 14 mg per 8 oz serving. White bread, 6 mg per slice. Meats and other proteins  Egg, 27 mg per 2 oz serving. Red meat, 7 mg per 4 oz serving. Chicken, 17 mg per 4 oz serving. Fish, cod, or trout, 20 mg per 4 oz serving. Dairy Cream cheese, regular, 14 mg per 1 Tbsp serving. Brie cheese, 50 mg per 1 oz serving. Parmesan cheese, 70 mg per 1 Tbsp serving. The items listed above may not be a complete list of foods lower in calcium. Actual amounts of calcium may be  different depending on processing. Contact a dietitian for more information. Summary Calcium is an important mineral in the body because it affects many functions. Getting enough calcium helps support strong bones and teeth throughout your life. Try to get most of your calcium from food. Calcium supplements may interact with certain medicines. Check with your health care provider or dietitian before starting any calcium supplements. This information is not intended to replace advice given to you by your health care provider. Make sure you discuss any questions you have with your health care provider. Document Revised: 11/13/2019 Document Reviewed: 11/13/2019 Elsevier Patient Education  2024 [employer] Inc[employer]   Osteoporosis  Osteoporosis happens when the bones become thin and less dense than normal. Osteoporosis makes bones more brittle and fragile and more likely to break (fracture). Over time, osteoporosis can cause your bones to become so weak that they fracture after a minor fall. Bones in the hip, wrist, and spine are most likely to fracture due to osteoporosis. What are the causes? The exact cause of this condition is not known. What increases the risk? You are more likely to develop this condition if you: Have family members with this condition. Have poor nutrition. Use the following: Steroid medicines, such as prednisone. Anti-seizure medicines. Nicotine or tobacco, such as cigarettes, e-cigarettes, and chewing tobacco. Are female. Are age 62 or older. Are not physically active (are sedentary). Are of European or Asian descent. Have a small body frame. What are the signs or symptoms? A fracture might be the first sign of osteoporosis, especially if the fracture results from a fall or injury that usually would not cause a bone to break. Other signs and symptoms include: Pain in the neck or low back. Stooped posture. Loss of height. How is this diagnosed? This condition may be  diagnosed based on: Your medical history. A physical exam. A bone mineral density test, also called a DXA or DEXA test (dual-energy X-ray absorptiometry test). This test uses X-rays to measure the amount of minerals in your bones. How is this treated? This condition may be treated by: Making lifestyle changes, such as: Including foods with more calcium and vitamin D in your diet. Doing weight-bearing and muscle-strengthening exercises. Stopping tobacco use. Limiting alcohol intake. Taking medicine to slow the process of bone loss or to increase bone density. Taking daily supplements of calcium and vitamin D. Taking hormone replacement medicines, such as estrogen for women and testosterone for men. Monitoring your levels of calcium and vitamin D. The goal of treatment is to strengthen your bones and lower your risk for a fracture. Follow these instructions at home: Eating and drinking Include calcium and vitamin D in your diet. Calcium is important for bone health, and vitamin D helps your body absorb calcium. Good sources of calcium and vitamin D include: Certain fatty fish, such as salmon and tuna. Products that have calcium and vitamin D added to them (are fortified), such as fortified cereals. Egg yolks. Cheese. Liver.  Activity Do exercises as told by your health care provider.  Ask your health care provider what exercises and activities are safe for you. You should do: Exercises that make you work against gravity (weight-bearing exercises), such as tai chi, yoga, or walking. Exercises to strengthen muscles, such as lifting weights. Lifestyle Do not drink alcohol if: Your health care provider tells you not to drink. You are pregnant, may be pregnant, or are planning to become pregnant. If you drink alcohol: Limit how much you use to: 0-1 drink a day for women. 0-2 drinks a day for men. Know how much alcohol is in your drink. In the [place] one drink equals one 12 oz bottle of  beer (355 mL), one 5 oz glass of wine (148 mL), or one 1 oz glass of hard liquor (44 mL). Do not use any products that contain nicotine or tobacco, such as cigarettes, e-cigarettes, and chewing tobacco. If you need help quitting, ask your health care provider. Preventing falls Use devices to help you move around (mobility aids) as needed, such as canes, walkers, scooters, or crutches. Keep rooms well-lit and clutter-free. Remove tripping hazards from walkways, including cords and throw rugs. Install grab bars in bathrooms and safety rails on stairs. Use rubber mats in the bathroom and other areas that are often wet or slippery. Wear closed-toe shoes that fit well and support your feet. Wear shoes that have rubber soles or low heels. Review your medicines with your health care provider. Some medicines can cause dizziness or changes in blood pressure, which can increase your risk of falling. General instructions Take over-the-counter and prescription medicines only as told by your health care provider. Keep all follow-up visits. This is important. Contact a health care provider if: You have never been screened for osteoporosis and you are: A woman who is age 76 or older. A man who is age 32 or older. Get help right away if: You fall or injure yourself. Summary Osteoporosis is thinning and loss of density in your bones. This makes bones more brittle and fragile and more likely to break (fracture),even with minor falls. The goal of treatment is to strengthen your bones and lower your risk for a fracture. Include calcium and vitamin D in your diet. Calcium is important for bone health, and vitamin D helps your body absorb calcium. Talk with your health care provider about screening for osteoporosis if you are a woman who is age 55 or older, or a man who is age 39 or older. This information is not intended to replace advice given to you by your health care provider. Make sure you discuss any  questions you have with your health care provider. Document Revised: 01/02/2020 Document Reviewed: 01/02/2020 Elsevier Patient Education  2024 [employer].

## 2023-03-15 NOTE — Progress Notes (Signed)
GYNECOLOGY  VISIT   HPI: 77 y.o.   Single  Caucasian  female   G0P0 with No LMP recorded. Patient has had a hysterectomy.   here for hot flashes. Pt is still having some fatigue. Wants to discuss prolia injection as well.  Hot flashes for 2 months.  Having dry sweats, maybe more at night.   Has increased anxiety and depression and doing medication management through [facility]. Lots of life stress and change.   Taking Requip for restless legs through neurology.   Saw Dr. Evlyn Kanner at her PCP office about her osteoporosis.  Right hip T score -3.1. She received a prescription for Tymlos, which she declines.  She would like to continue Prolia.  She was due in May for her Prolia injection.    Used Reclast once.   GYNECOLOGIC HISTORY: No LMP recorded. Patient has had a hysterectomy. Contraception:  hyst Menopausal hormone therapy:  n/a Last mammogram:  01/25/22 Breast Density Category A, BI-RADS CATEGORY 1 Neg  Last pap smear:   09/28/22 neg: HR HPV neg,  09/27/21 benign glandular cells of the vagina: HR HPV neg: biopsy showed benign fibroepithelial polyp, 08/19/20 neg         OB History     Gravida  0   Para  0   Term      Preterm      AB      Living         SAB      IAB      Ectopic      Multiple      Live Births                 Patient Active Problem List   Diagnosis Date Noted   Hyperlipidemia 02/16/2023   Atrial tachycardia 11/16/2021   Chronic fatigue and malaise 11/16/2021   Encounter for therapeutic drug monitoring 02/02/2021   Acute deep vein thrombosis (DVT) of right lower extremity, unspecified vein (HCC) 01/25/2021   Aortic atherosclerosis (HCC) 11/17/2020   Acute DVT (deep venous thrombosis) (HCC) 11/13/2020   Depression with anxiety 11/13/2020   RLS (restless legs syndrome) 01/07/2020   Insomnia due to medical condition 01/07/2020   Nocturia more than twice per night 01/07/2020   Bradycardia 12/14/2017   Abnormal EKG    GERD  (gastroesophageal reflux disease)    Dyspnea on exertion 10/30/2017   Family history of ovarian cancer 04/17/2017   Family history of breast cancer 04/17/2017   Family history of colon cancer 04/17/2017   Family history of pancreatic cancer 04/17/2017   Family history of melanoma 04/17/2017   Genetic testing 04/17/2017   CIN III (cervical intraepithelial neoplasia III)    Arthritis    Fibromyalgia    Chronic pulmonary embolism (HCC)    Thyroid disease    Osteoporosis    DVT (deep venous thrombosis) (HCC) 04/01/2004    Past Medical History:  Diagnosis Date   Abnormal EKG    Anxiety    Arthritis    oa   Bradycardia 12/14/2017   CIN III (cervical intraepithelial neoplasia III) 2000   Complication of anesthesia    did well last 2 times with procedures   Depression    DES exposure in utero    DVT (deep venous thrombosis) (HCC) 01/2009   LEFT LEG   DVT (deep venous thrombosis) (HCC) 10/30/2020   Dyspnea on exertion 10/2017   Elevated triglycerides with high cholesterol    Endometriosis    Fibromyalgia  GERD (gastroesophageal reflux disease)    History of colon polyps    History of hiatal hernia    told by some md has, some say not   IBS (irritable bowel syndrome) 2015   Left bundle branch block    intermittent   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   Lupus anticoagulant disorder (HCC)    Multinodular goiter    Osteoporosis 12/2017   T score -3.2 stable from prior study   PE (pulmonary embolism)    Pneumonia 03/01/2021   PONV (postoperative nausea and vomiting)    Restless legs syndrome    Sinus bradycardia    Sleep disorder    Thyroid disease    HYPERTHYROIDISM   Vitamin D deficiency     Past Surgical History:  Procedure Laterality Date   ABDOMINAL HYSTERECTOMY  2001   TAH, partial   APPENDECTOMY  1978   COLONOSCOPY WITH PROPOFOL N/A 12/07/2015   Procedure: COLONOSCOPY WITH PROPOFOL;  Surgeon: Charolett Bumpers, MD;  Location: [facility]  ENDOSCOPY;  Service: Endoscopy;  Laterality: N/A;   COLONOSCOPY WITH PROPOFOL N/A 01/11/2022   Procedure: COLONOSCOPY WITH PROPOFOL;  Surgeon: Kerin Salen, MD;  Location: [facility] ENDOSCOPY;  Service: Gastroenterology;  Laterality: N/A;   colonscopy  7 yrs ago   other in past   ESOPHAGOGASTRODUODENOSCOPY (EGD) WITH PROPOFOL N/A 12/07/2015   Procedure: ESOPHAGOGASTRODUODENOSCOPY (EGD) WITH PROPOFOL;  Surgeon: Charolett Bumpers, MD;  Location: [facility] ENDOSCOPY;  Service: Endoscopy;  Laterality: N/A;   ESOPHAGOGASTRODUODENOSCOPY ENDOSCOPY  yrs ago   FACIAL COSMETIC SURGERY     GYNECOLOGIC CRYOSURGERY     HEMOSTASIS CLIP PLACEMENT  01/11/2022   Procedure: HEMOSTASIS CLIP PLACEMENT;  Surgeon: Kerin Salen, MD;  Location: [facility] END[facility]COPY;  Service: Gastroenterology;;   HOT HEMOSTASIS N/A 01/11/2022   Procedure: HOT HEMOSTASIS (ARGON PLASMA COAGULATION/BICAP);  Surgeon: Kerin Salen, MD;  Location: [facility];  Service: Gastroenterology;  Laterality: N/A;   MYOMECTOMY     POLYPECTOMY  01/11/2022   Procedure: POLYPECTOMY;  Surgeon: Kerin Salen, MD;  Location: [facility] ENDOSCOPY;  Service: Gastroenterology;;   REPLACEMENT TOTAL JOINT WRIST W/ PROSTHETIC IMPLANT Right 08/01/2021   TONSILLECTOMY      Current Outpatient Medications  Medication Sig Dispense Refill   ALPRAZolam (XANAX) 0.25 MG tablet Take 1 tablet (0.25 mg total) by mouth every 8 (eight) hours as needed for anxiety. 30 tablet 0   BD PEN NEEDLE NANO 2ND GEN 32G X 4 MM MISC AS DIRECTED WITH TYMLOS DAILY 90 DAYS     Evolocumab (REPATHA SURECLICK) 140 MG/ML SOAJ Inject 140 mg into the skin every 14 (fourteen) days. 2 mL 11   hydrOXYzine (ATARAX) 10 MG tablet Take 1 tablet (10 mg total) by mouth 3 (three) times daily as needed. 90 tablet 1   methocarbamol (ROBAXIN) 500 MG tablet Take 250 mg by mouth daily as needed for muscle spasms.     nitroGLYCERIN (NITROSTAT) 0.4 MG SL tablet Place under the tongue.     ondansetron (ZOFRAN-ODT) 8 MG disintegrating tablet Take  1 tablet (8 mg total) by mouth every 8 (eight) hours as needed for nausea. 20 tablet 0   rOPINIRole (REQUIP) 1 MG tablet Take 1 tablet (1 mg total) by mouth at bedtime. 90 tablet 2   sucralfate (CARAFATE) 1 g tablet Take 1 tablet (1 g total) by mouth 4 (four) times daily -  with meals and at bedtime. 60 tablet 0   Vilazodone HCl 20 MG TABS Take one tablet by mouth  daily 30 tablet 1   warfarin (COUMADIN) 1 MG tablet Take 1/2 tablet to 1 tablet by mouth daily or as directed by Anticoagulation Clinic. 90 tablet 1   denosumab (PROLIA) 60 MG/ML SOLN injection Inject 60 mg into the skin every 6 (six) months. Administer in upper arm, thigh, or abdomen (Patient not taking: Reported on 02/16/2023) 180 mL 2   Current Facility-Administered Medications  Medication Dose Route Frequency Provider Last Rate Last Admin   denosumab (PROLIA) injection 60 mg  60 mg Subcutaneous Once Patton Salles, MD         ALLERGIES: Sulfa antibiotics  Family History  Problem Relation Age of Onset   Depression Mother    Anxiety disorder Mother    Hypertension Mother    Breast cancer Mother        56's   Cancer Father        COLON   Depression Sister    Anxiety disorder Sister    Hypertension Sister    Stroke Sister    Breast cancer Maternal Aunt        Age 70's   Cancer Maternal Aunt        Melanoma   Alzheimer's disease Maternal Aunt    Breast cancer Maternal Aunt        70's   Cancer Maternal Aunt        Colon CA   Alzheimer's disease Maternal Aunt    Cancer Paternal Aunt        OVARIAN and COLON    Social History   Socioeconomic History   Marital status: Single    Spouse name: Not on file   Number of children: 0   Years of education: 16   Highest education level: Bachelor's degree (e.g., BA, AB, BS)  Occupational History   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  Tobacco Use   Smoking status: Former    Current packs/day: 0.00    Average packs/day: 0.1 packs/day for 6.0 years  (0.6 ttl pk-yrs)    Types: Cigarettes    Start date: 08/01/1972    Quit date: 08/01/1978    Years since quitting: 44.6   Smokeless tobacco: Never   Tobacco comments:    only smoked 4-6 yrs 1 pack per month  Vaping Use   Vaping status: Never Used  Substance and Sexual Activity   Alcohol use: Yes    Comment: 1 drink per month   Drug use: No   Sexual activity: Not Currently    Birth control/protection: Surgical    Comment: HYSTERECTOMY-1st intercourse 27-Fewer than 5 partners  Other Topics Concern   Not on file  Social History Narrative   Lives alone in [place].    Social Determinants of Health   Financial Resource Strain: Not on file  Food Insecurity: Not on file  Transportation Needs: Not on file  Physical Activity: Not on file  Stress: Not on file  Social Connections: Not on file  Intimate Partner Violence: Not on file    Review of Systems  All other systems reviewed and are negative.   PHYSICAL EXAMINATION:    BP 118/76 (BP Location: Left Arm, Patient Position: Sitting, Cuff Size: Normal)   Pulse (!) 57   Ht 5\' 2"  (1.575 m)   Wt 122 lb (55.3 kg)   SpO2 98%   BMI 22.31 kg/m     General appearance: alert, cooperative and appears stated age  ASSESSMENT  Increased heat.  Likely nongynecologic etiology.  Hx overactive  thyroid. On Requip.  Osteoporosis.  Anxiety.   PLAN  She will follow up with her other providers regarding her increased heat.  Prolia and Reclast discussed.  She wants to continue Prolia, and she prefers to do treatment here. We discussed increased risk of compression fracture of the spine if she stops Prolia and chooses to take nothing for her osteoporosis.  Need to check calcium, vit D level. She will take supplements for her calcium and vit D.  I did give the patient a brochure for [facility] counseling if she does not have a Veterinary surgeon at [facility].   30 min  total time was spent for this patient encounter, including preparation, face-to-face  counseling with the patient, coordination of care, and documentation of the encounter.

## 2023-03-16 ENCOUNTER — Ambulatory Visit: Payer: PPO

## 2023-03-17 ENCOUNTER — Emergency Department (HOSPITAL_BASED_OUTPATIENT_CLINIC_OR_DEPARTMENT_OTHER)
Admission: EM | Admit: 2023-03-17 | Discharge: 2023-03-17 | Disposition: A | Discharge status: Home / Self Care | Payer: PPO | Attending: Emergency Medicine | Admitting: Emergency Medicine

## 2023-03-17 ENCOUNTER — Encounter (HOSPITAL_BASED_OUTPATIENT_CLINIC_OR_DEPARTMENT_OTHER): Payer: Self-pay | Admitting: Emergency Medicine

## 2023-03-17 ENCOUNTER — Other Ambulatory Visit: Payer: Self-pay

## 2023-03-17 ENCOUNTER — Other Ambulatory Visit (HOSPITAL_BASED_OUTPATIENT_CLINIC_OR_DEPARTMENT_OTHER): Payer: Self-pay

## 2023-03-17 ENCOUNTER — Emergency Department (HOSPITAL_BASED_OUTPATIENT_CLINIC_OR_DEPARTMENT_OTHER): Payer: PPO

## 2023-03-17 DIAGNOSIS — R1032 Left lower quadrant pain: Secondary | ICD-10-CM | POA: Diagnosis not present

## 2023-03-17 DIAGNOSIS — R457 State of emotional shock and stress, unspecified: Secondary | ICD-10-CM | POA: Diagnosis not present

## 2023-03-17 DIAGNOSIS — Z90710 Acquired absence of both cervix and uterus: Secondary | ICD-10-CM | POA: Diagnosis not present

## 2023-03-17 DIAGNOSIS — R1084 Generalized abdominal pain: Secondary | ICD-10-CM | POA: Diagnosis not present

## 2023-03-17 DIAGNOSIS — K529 Noninfective gastroenteritis and colitis, unspecified: Secondary | ICD-10-CM

## 2023-03-17 DIAGNOSIS — K769 Liver disease, unspecified: Secondary | ICD-10-CM | POA: Diagnosis not present

## 2023-03-17 DIAGNOSIS — I959 Hypotension, unspecified: Secondary | ICD-10-CM | POA: Diagnosis not present

## 2023-03-17 DIAGNOSIS — Z7901 Long term (current) use of anticoagulants: Secondary | ICD-10-CM | POA: Diagnosis not present

## 2023-03-17 LAB — CBC WITH DIFFERENTIAL/PLATELET
Abs Immature Granulocytes: 0.03 10*3/uL (ref 0.00–0.07)
Basophils Absolute: 0 10*3/uL (ref 0.0–0.1)
Basophils Relative: 1 %
Eosinophils Absolute: 0.1 10*3/uL (ref 0.0–0.5)
Eosinophils Relative: 1 %
HCT: 44.1 % (ref 36.0–46.0)
Hemoglobin: 14.8 g/dL (ref 12.0–15.0)
Immature Granulocytes: 0 %
Lymphocytes Relative: 21 %
Lymphs Abs: 1.6 10*3/uL (ref 0.7–4.0)
MCH: 31.1 pg (ref 26.0–34.0)
MCHC: 33.6 g/dL (ref 30.0–36.0)
MCV: 92.6 fL (ref 80.0–100.0)
Monocytes Absolute: 0.6 10*3/uL (ref 0.1–1.0)
Monocytes Relative: 8 %
Neutro Abs: 5.4 10*3/uL (ref 1.7–7.7)
Neutrophils Relative %: 69 %
Platelets: 244 10*3/uL (ref 150–400)
RBC: 4.76 MIL/uL (ref 3.87–5.11)
RDW: 13.2 % (ref 11.5–15.5)
WBC: 7.6 10*3/uL (ref 4.0–10.5)
nRBC: 0 % (ref 0.0–0.2)

## 2023-03-17 LAB — COMPREHENSIVE METABOLIC PANEL
ALT: 14 U/L (ref 0–44)
AST: 17 U/L (ref 15–41)
Albumin: 3.8 g/dL (ref 3.5–5.0)
Alkaline Phosphatase: 50 U/L (ref 38–126)
Anion gap: 8 (ref 5–15)
BUN: 20 mg/dL (ref 8–23)
CO2: 27 mmol/L (ref 22–32)
Calcium: 9 mg/dL (ref 8.9–10.3)
Chloride: 104 mmol/L (ref 98–111)
Creatinine, Ser: 0.76 mg/dL (ref 0.44–1.00)
GFR, Estimated: >60 mL/min (ref >60)
Glucose, Bld: 100 mg/dL — ABNORMAL HIGH (ref 70–99)
Potassium: 3.8 mmol/L (ref 3.5–5.1)
Sodium: 139 mmol/L (ref 135–145)
Total Bilirubin: 0.6 mg/dL (ref 0.3–1.2)
Total Protein: 6.6 g/dL (ref 6.5–8.1)

## 2023-03-17 LAB — URINALYSIS, ROUTINE W REFLEX MICROSCOPIC
Bilirubin Urine: NEGATIVE
Glucose, UA: NEGATIVE mg/dL
Hgb urine dipstick: NEGATIVE
Leukocytes,Ua: NEGATIVE
Nitrite: NEGATIVE
Specific Gravity, Urine: >1.046 — ABNORMAL HIGH (ref 1.005–1.030)
pH: 7 (ref 5.0–8.0)

## 2023-03-17 LAB — LIPASE, BLOOD: Lipase: 40 U/L (ref 11–51)

## 2023-03-17 LAB — PROTIME-INR
INR: 2.6 — ABNORMAL HIGH (ref 0.8–1.2)
Prothrombin Time: 28.1 seconds — ABNORMAL HIGH (ref 11.4–15.2)

## 2023-03-17 MED ORDER — IOHEXOL 300 MG/ML  SOLN
100.0000 mL | Freq: Once | INTRAMUSCULAR | Status: AC | PRN
  Administered 2023-03-17: 100 mL via INTRAVENOUS

## 2023-03-17 MED ORDER — OXYCODONE HCL 5 MG PO TABS
5.0000 mg | ORAL_TABLET | Freq: Once | ORAL | Status: AC
  Administered 2023-03-17: 5 mg via ORAL
  Filled 2023-03-17: qty 1

## 2023-03-17 MED ORDER — OXYCODONE HCL 5 MG PO TABS
5.0000 mg | ORAL_TABLET | Freq: Three times a day (TID) | ORAL | 0 refills | Status: DC | PRN
Start: 2023-03-17 — End: 2023-05-18

## 2023-03-17 MED ORDER — ACETAMINOPHEN 325 MG PO TABS
650.0000 mg | ORAL_TABLET | Freq: Once | ORAL | Status: AC
  Administered 2023-03-17: 650 mg via ORAL
  Filled 2023-03-17: qty 2

## 2023-03-17 NOTE — ED Notes (Signed)
No N/V/D per patient

## 2023-03-17 NOTE — ED Notes (Signed)
Pt attempted to give urine sample but dropped cup in the toilet.

## 2023-03-17 NOTE — ED Notes (Signed)
Pt given discharge instructions and reviewed prescriptions. Opportunities given for questions. Pt verbalizes understanding. PIV removed x1. Stone,Heather R, RN 

## 2023-03-17 NOTE — ED Triage Notes (Signed)
Pt arrived via [facility] from home c/o abd pain since yesterday with diarrhea. Pt caox4 and ambuHx diverticulitis. Denies N/V. Afebrile.

## 2023-03-17 NOTE — ED Provider Notes (Signed)
[place] [facility]GENCY DEPARTMENT AT [facility] Provider Note   CSN: [id number] Arrival date & time: 03/17/23  0805     History  Chief Complaint  Patient presents with   Abdominal Pain    Emma Lopez is a 77 y.o. female.  Patient here with left lower quadrant abdominal pain for the last 2 days.  History of multiple abdominal surgeries, fibromyalgia, depression, anxiety, restless legs, blood clots on Coumadin, irritable bowel syndrome.  Denies any pain at urination.  Denies any chest pain or shortness of breath or weakness or numbness or chills.  Denies any fevers.  Nothing makes it worse or better.  Recently treated for diverticulitis.  Sounds like primary care doctor called in antibiotic yesterday after discussion on the phone with them.  She has not started antibiotics.  The history is provided by the patient.       Home Medications Prior to Admission medications   Medication Sig Start Date End Date Taking? Authorizing Provider  oxyCODONE (ROXICODONE) 5 MG immediate release tablet Take 1 tablet (5 mg total) by mouth every 8 (eight) hours as needed for up to 6 doses for breakthrough pain. 03/17/23  Yes Eldine Rencher, DO  ALPRAZolam (XANAX) 0.25 MG tablet Take 1 tablet (0.25 mg total) by mouth every 8 (eight) hours as needed for anxiety. 04/05/17   Harrington Challenger, NP  BD PEN NEEDLE NANO 2ND GEN 32G X 4 MM MISC AS DIRECTED WITH TYMLOS DAILY 90 DAYS 01/11/23   [provider]  denosumab (PROLIA) 60 MG/ML SOLN injection Inject 60 mg into the skin every 6 (six) months. Administer in upper arm, thigh, or abdomen Patient not taking: Reported on 02/16/2023 03/05/15   Harrington Challenger, NP  Evolocumab (REPATHA SURECLICK) 140 MG/ML SOAJ Inject 140 mg into the skin every 14 (fourteen) days. 02/17/23   Swinyer, Zachary George, NP  hydrOXYzine (ATARAX) 10 MG tablet Take 1 tablet (10 mg total) by mouth 3 (three) times daily as needed. 03/08/23   Joan Flores, NP  methocarbamol (ROBAXIN) 500  MG tablet Take 250 mg by mouth daily as needed for muscle spasms.    [provider]  nitroGLYCERIN (NITROSTAT) 0.4 MG SL tablet Place under the tongue. 03/03/23   [provider]  ondansetron (ZOFRAN-ODT) 8 MG disintegrating tablet Take 1 tablet (8 mg total) by mouth every 8 (eight) hours as needed for nausea. 02/14/23   Derwood Kaplan, MD  rOPINIRole (REQUIP) 1 MG tablet Take 1 tablet (1 mg total) by mouth at bedtime. 11/17/22   Dohmeier, Porfirio Mylar, MD  sucralfate (CARAFATE) 1 g tablet Take 1 tablet (1 g total) by mouth 4 (four) times daily -  with meals and at bedtime. 02/14/23   Derwood Kaplan, MD  Vilazodone HCl 20 MG TABS Take one tablet by mouth daily 03/08/23   Joan Flores, NP  warfarin (COUMADIN) 1 MG tablet Take 1/2 tablet to 1 tablet by mouth daily or as directed by Anticoagulation Clinic. 06/02/22   Quintella Reichert, MD      Allergies    Sulfa antibiotics    Review of Systems   Review of Systems  Physical Exam Updated Vital Signs BP (!) 161/62 (BP Location: Right Arm)   Pulse 60   Temp 97.8 F (36.6 C) (Oral)   Resp 20   Ht 5\' 2"  (1.575 m)   Wt 54.4 kg   SpO2 100%   BMI 21.95 kg/m  Physical Exam Vitals and nursing note reviewed.  Constitutional:  General: She is not in acute distress.    Appearance: She is well-developed. She is not ill-appearing.  HENT:     Head: Normocephalic and atraumatic.     Mouth/Throat:     Mouth: Mucous membranes are moist.  Eyes:     Extraocular Movements: Extraocular movements intact.     Conjunctiva/sclera: Conjunctivae normal.     Pupils: Pupils are equal, round, and reactive to light.  Cardiovascular:     Rate and Rhythm: Normal rate and regular rhythm.     Heart sounds: Normal heart sounds. No murmur heard. Pulmonary:     Effort: Pulmonary effort is normal. No respiratory distress.     Breath sounds: Normal breath sounds.  Abdominal:     General: Abdomen is flat.     Palpations: Abdomen is soft.      Tenderness: There is abdominal tenderness in the left lower quadrant.  Musculoskeletal:        General: No swelling.     Cervical back: Neck supple.  Skin:    General: Skin is warm and dry.     Capillary Refill: Capillary refill takes less than 2 seconds.  Neurological:     Mental Status: She is alert.  Psychiatric:        Mood and Affect: Mood normal.     ED Results / Procedures / Treatments   Labs (all labs ordered are listed, but only abnormal results are displayed) Labs Reviewed  COMPREHENSIVE METABOLIC PANEL - Abnormal; Notable for the following components:      Result Value   Glucose, Bld 100 (*)    All other components within normal limits  URINALYSIS, ROUTINE W REFLEX MICROSCOPIC - Abnormal; Notable for the following components:   Color, Urine COLORLESS (*)    Specific Gravity, Urine >1.046 (*)    Ketones, ur TRACE (*)    Protein, ur TRACE (*)    All other components within normal limits  PROTIME-INR - Abnormal; Notable for the following components:   Prothrombin Time 28.1 (*)    INR 2.6 (*)    All other components within normal limits  CBC WITH DIFFERENTIAL/PLATELET  LIPASE, BLOOD    EKG None  Radiology No results found.  Procedures Procedures    Medications Ordered in ED Medications  oxyCODONE (Oxy IR/ROXICODONE) immediate release tablet 5 mg (has no administration in time range)  acetaminophen (TYLENOL) tablet 650 mg (650 mg Oral Given 03/17/23 0925)  iohexol (OMNIPAQUE) 300 MG/ML solution 100 mL (100 mLs Intravenous Contrast Given 03/17/23 0912)    ED Course/ Medical Decision Making/ A&P                                 Medical Decision Making Amount and/or Complexity of Data Reviewed Labs: ordered. Radiology: ordered.  Risk OTC drugs. Prescription drug management.   [name] is here with abdominal pain.  History of fibromyalgia, blood clots on Coumadin, restless legs, IBS.  Patient overall appears well.  Unremarkable vitals.  Little  bit of tenderness in left lower abdomen.  She has had prior hysterectomy.  Differential diagnosis possibly diverticulitis versus UTI seems less likely to be bowel obstruction, could be chronic pain process/IBS process.  I offered her narcotic pain medicine but she declined and will give Tylenol.  Will check CBC, CMP, lipase, urinalysis, CT scan abdomen and pelvis.  CT scan per radiology report may be with a low-grade colitis.  Otherwise lab work  for my review interpretation is unremarkable.  No significant anemia, electrolyte abnormality or kidney injury or leukocytosis.  Overall will prescribe oxycodone for breakthrough pain.  She is taking this in the past.  Will give her a dose now.  She is ready been prescribed antibiotics by her primary care doctor for this and she will take that.  Patient discharged in good condition.  Recommend close follow-up with primary care doctor.  She is given return precautions including fever, worsening pain.  This chart was dictated using voice recognition software.  Despite best efforts to proofread,  errors can occur which can change the documentation meaning.         Final Clinical Impression(s) / ED Diagnoses Final diagnoses:  Colitis    Rx / DC Orders ED Discharge Orders          Ordered    oxyCODONE (ROXICODONE) 5 MG immediate release tablet  Every 8 hours PRN        03/17/23 1048              Aradhya Shellenbarger, DO 03/17/23 1050

## 2023-03-17 NOTE — Discharge Instructions (Signed)
Take antibiotics as prescribed by your primary care doctor.  Recommend 1000 mg of Tylenol every 6 hours as needed for pain.  You can take Roxicodon, narcotic pain medicine, for breakthrough pain.  Please do not mix with alcohol or drugs or other dangerous activities including driving.  Overall I expect that you would get some good improvement in the next 48 to 72 hours.  If you are not feeling better please return for reevaluation especially if you develop a fever greater than 100.4.  Follow-up with your primary care doctor as well for reevaluation this week.

## 2023-03-19 ENCOUNTER — Telehealth: Payer: Self-pay | Admitting: Obstetrics and Gynecology

## 2023-03-19 ENCOUNTER — Encounter: Payer: Self-pay | Admitting: Neurology

## 2023-03-19 DIAGNOSIS — M810 Age-related osteoporosis without current pathological fracture: Secondary | ICD-10-CM

## 2023-03-19 NOTE — Telephone Encounter (Signed)
Please arrange for Prolia to resume this fall.  Patient was due in May, 2024.   Of note, on chart review, patient is currently being treated for diverticulitis.   I would recommend waiting at least 6 weeks prior to starting back on Prolia.   I would also recommend checking a vitamin D level prior to receiving the Prolia.  This can be checked at any time.  I will place a future order.

## 2023-03-21 ENCOUNTER — Other Ambulatory Visit: Payer: Self-pay | Admitting: Obstetrics and Gynecology

## 2023-03-21 ENCOUNTER — Encounter: Payer: Self-pay | Admitting: Obstetrics and Gynecology

## 2023-03-21 DIAGNOSIS — R232 Flushing: Secondary | ICD-10-CM

## 2023-03-22 ENCOUNTER — Other Ambulatory Visit: Payer: Self-pay | Admitting: Gastroenterology

## 2023-03-22 ENCOUNTER — Telehealth: Payer: Self-pay

## 2023-03-22 DIAGNOSIS — Z7901 Long term (current) use of anticoagulants: Secondary | ICD-10-CM | POA: Diagnosis not present

## 2023-03-22 DIAGNOSIS — K529 Noninfective gastroenteritis and colitis, unspecified: Secondary | ICD-10-CM | POA: Diagnosis not present

## 2023-03-22 NOTE — Telephone Encounter (Signed)
 Patient is returning call. Transferred to [place], CMA.

## 2023-03-22 NOTE — Telephone Encounter (Signed)
Patient with diagnosis of DVT while taking Xarelto, changed to Pradaxa then warfarin, higher INR goal 2.5-3.5 given prior recurrent VTE, also positive for lupus anticoagulant.   Procedure: colonoscopy Date of procedure: 05/02/23  CrCl 34mL/min Platelet count 244K  Per office protocol, patient can hold warfarin for 5 days prior to procedure. Patient will need bridging with Lovenox (enoxaparin) around procedure. INRs are followed at [facility] clinic who can coordinate bridge.  **This guidance is not considered finalized until pre-operative APP has relayed final recommendations.**

## 2023-03-22 NOTE — Telephone Encounter (Signed)
   Name: Emma Lopez  DOB: 1946/05/17  MRN: [id number]  Primary Cardiologist: Armanda Magic, MD   Preoperative team, please contact this patient and set up a phone call appointment for further preoperative risk assessment. Please obtain consent and complete medication review. Thank you for your help.  I confirm that guidance regarding antiplatelet and oral anticoagulation therapy has been completed and, if necessary, noted below.  Per office protocol, patient can hold warfarin for 5 days prior to procedure. Patient will need bridging with Lovenox (enoxaparin) around procedure. INRs are followed at [facility] Coumadin clinic who can coordinate bridge.    Napoleon Form, Leodis Rains, NP 03/22/2023, 12:55 PM [facility]

## 2023-03-22 NOTE — Telephone Encounter (Signed)
Left message for the patient to contact the office. 

## 2023-03-22 NOTE — Telephone Encounter (Signed)
Pharmacy please advise on holding Coumadin prior to colonoscopy scheduled for 05/02/2023. Thank you.

## 2023-03-22 NOTE — Telephone Encounter (Signed)
Spoke with the patient who states she has an in office appt in September. Reviewed chart and patient has an appt with Eligha Bridegroom, NP on 04/17/23. Patient states she would rather come to the office for clearance.

## 2023-03-22 NOTE — Telephone Encounter (Signed)
..     Pre-operative Risk Assessment    Patient Name: Emma Lopez  DOB: [birth year] MRN: [id number]  Last appt 11/09/22 No schedule appt    Request for Surgical Clearance    Procedure:   colonoscopy  Date of Surgery:  Clearance 05/02/23                                 Surgeon:  dr Marca Ancona Surgeon's Group or Practice Name:  [facility] gastroenterology Phone number:  [phone] Fax number:  [phone]   Type of Clearance Requested:   - Medical  - Pharmacy:  Hold Warfarin (Coumadin)     Type of Anesthesia:   propofol   Additional requests/questions:    Jola Babinski   03/22/2023, 11:50 AM

## 2023-03-24 ENCOUNTER — Other Ambulatory Visit: Payer: PPO

## 2023-03-24 DIAGNOSIS — R5382 Chronic fatigue, unspecified: Secondary | ICD-10-CM | POA: Diagnosis not present

## 2023-03-24 DIAGNOSIS — E785 Hyperlipidemia, unspecified: Secondary | ICD-10-CM | POA: Diagnosis not present

## 2023-03-24 DIAGNOSIS — R232 Flushing: Secondary | ICD-10-CM | POA: Diagnosis not present

## 2023-03-24 DIAGNOSIS — R61 Generalized hyperhidrosis: Secondary | ICD-10-CM | POA: Diagnosis not present

## 2023-03-24 DIAGNOSIS — M810 Age-related osteoporosis without current pathological fracture: Secondary | ICD-10-CM | POA: Diagnosis not present

## 2023-03-24 DIAGNOSIS — E079 Disorder of thyroid, unspecified: Secondary | ICD-10-CM | POA: Diagnosis not present

## 2023-03-24 DIAGNOSIS — R5381 Other malaise: Secondary | ICD-10-CM | POA: Diagnosis not present

## 2023-03-24 DIAGNOSIS — E559 Vitamin D deficiency, unspecified: Secondary | ICD-10-CM | POA: Diagnosis not present

## 2023-03-25 LAB — T3, FREE: T3, Free: 3.8 pg/mL (ref 2.3–4.2)

## 2023-03-25 LAB — T4, FREE: Free T4: 1.8 ng/dL (ref 0.8–1.8)

## 2023-03-25 LAB — VITAMIN D 25 HYDROXY (VIT D DEFICIENCY, FRACTURES): Vit D, 25-Hydroxy: 37 ng/mL (ref 30–100)

## 2023-03-25 LAB — ESTRADIOL: Estradiol: 16 pg/mL

## 2023-03-25 LAB — TSH: TSH: 0.22 m[IU]/L — ABNORMAL LOW (ref 0.40–4.50)

## 2023-03-25 LAB — FOLLICLE STIMULATING HORMONE: FSH: 88.4 m[IU]/mL

## 2023-03-27 ENCOUNTER — Encounter: Payer: Self-pay | Admitting: Obstetrics and Gynecology

## 2023-03-27 DIAGNOSIS — H35372 Puckering of macula, left eye: Secondary | ICD-10-CM | POA: Diagnosis not present

## 2023-03-27 DIAGNOSIS — H31003 Unspecified chorioretinal scars, bilateral: Secondary | ICD-10-CM | POA: Diagnosis not present

## 2023-03-27 DIAGNOSIS — H40011 Open angle with borderline findings, low risk, right eye: Secondary | ICD-10-CM | POA: Diagnosis not present

## 2023-03-27 DIAGNOSIS — H2513 Age-related nuclear cataract, bilateral: Secondary | ICD-10-CM | POA: Diagnosis not present

## 2023-03-27 NOTE — Telephone Encounter (Signed)
Routing to Dr. Antony Blackbird.   Encounter closed.

## 2023-03-28 NOTE — Telephone Encounter (Signed)
Insurance information submitted to [employer] portal. Will await summary of benefits for prolia.    

## 2023-03-29 ENCOUNTER — Ambulatory Visit: Payer: PPO | Admitting: Obstetrics and Gynecology

## 2023-04-01 ENCOUNTER — Other Ambulatory Visit: Payer: Self-pay | Admitting: Behavioral Health

## 2023-04-01 DIAGNOSIS — F411 Generalized anxiety disorder: Secondary | ICD-10-CM

## 2023-04-01 DIAGNOSIS — F331 Major depressive disorder, recurrent, moderate: Secondary | ICD-10-CM

## 2023-04-06 ENCOUNTER — Ambulatory Visit: Payer: PPO | Attending: Internal Medicine

## 2023-04-06 DIAGNOSIS — I824Z2 Acute embolism and thrombosis of unspecified deep veins of left distal lower extremity: Secondary | ICD-10-CM | POA: Diagnosis not present

## 2023-04-06 DIAGNOSIS — Z5181 Encounter for therapeutic drug level monitoring: Secondary | ICD-10-CM

## 2023-04-06 LAB — POCT INR: INR: 2.6 (ref 2.0–3.0)

## 2023-04-06 MED ORDER — ENOXAPARIN SODIUM 60 MG/0.6ML IJ SOSY: 60.0000 mg | PREFILLED_SYRINGE | Freq: Two times a day (BID) | INTRAMUSCULAR | 1 refills | Status: DC

## 2023-04-06 NOTE — Patient Instructions (Addendum)
Description   Continue taking Warfarin 1 tablet daily except for 1/2 tablet on Sundays, Tuesdays, and Thursdays. On 04/26/23 follow pre/post procedure instructions.  Recheck INR 1 week post procedure.   Coumadin Clinic [phone]      9/25: Last dose of warfarin.  9/26: No warfarin or enoxaparin (Lovenox).  9/27: Inject enoxaparin 60mg  in the fatty abdominal tissue at least 2 inches from the belly button twice a day about 12 hours apart, 8am and 8pm rotate sites. No warfarin.  9/28: Inject enoxaparin in the fatty tissue every 12 hours, 8am and 8pm. No warfarin.  9/29: Inject enoxaparin in the fatty tissue every 12 hours, 8am and 8pm. No warfarin.  9/30: Inject enoxaparin in the fatty tissue in the morning at 8 am (No PM dose). No warfarin.  10/1: Procedure Day - No enoxaparin - Resume warfarin in the evening or as directed by doctor (take an extra half tablet with usual dose for 2 days then resume normal dose).  10/2: Resume enoxaparin inject in the fatty tissue every 12 hours and take warfarin  10/3: Inject enoxaparin in the fatty tissue every 12 hours and take warfarin  10/4: Inject enoxaparin in the fatty tissue every 12 hours and take warfarin  10/5: Inject enoxaparin in the fatty tissue every 12 hours and take warfarin  10/6: Inject enoxaparin in the fatty tissue every 12 hours and take warfarin  10/7: warfarin appt to check INR.

## 2023-04-06 NOTE — Telephone Encounter (Signed)
Call to patient. Patient declines to review prolia benefits and schedule injection at this time. Will call back when convenient for her. States she has number to our office.

## 2023-04-06 NOTE — Telephone Encounter (Addendum)
Deductible:  None   OOP MAX: None   Annual exam: 09-28-22 BS  Calcium:      8.7      Date: 06/09/23 from PCP office   Upcoming dental procedures:   Hx of Kidney Disease:   Last Bone Density Scan: 08-24-22   Is Prior Authorization needed: no  Pt estimated Cost: ~$280.10     Coverage Details: Prolia and administration will be subject to a 20% coinsurance up to $3200 OOP max ($1703.56 met). Once met, coverage increases to 100%. No deductible applies.

## 2023-04-10 ENCOUNTER — Ambulatory Visit: Payer: PPO | Admitting: Behavioral Health

## 2023-04-10 NOTE — Progress Notes (Signed)
Pt called in and was sick. She will be RS. No charges this time.

## 2023-04-11 DIAGNOSIS — M810 Age-related osteoporosis without current pathological fracture: Secondary | ICD-10-CM | POA: Diagnosis not present

## 2023-04-11 DIAGNOSIS — R5381 Other malaise: Secondary | ICD-10-CM | POA: Diagnosis not present

## 2023-04-11 DIAGNOSIS — E042 Nontoxic multinodular goiter: Secondary | ICD-10-CM | POA: Diagnosis not present

## 2023-04-11 DIAGNOSIS — R7303 Prediabetes: Secondary | ICD-10-CM | POA: Diagnosis not present

## 2023-04-11 DIAGNOSIS — F419 Anxiety disorder, unspecified: Secondary | ICD-10-CM | POA: Diagnosis not present

## 2023-04-11 DIAGNOSIS — E559 Vitamin D deficiency, unspecified: Secondary | ICD-10-CM | POA: Diagnosis not present

## 2023-04-11 DIAGNOSIS — I700 Atherosclerosis of aorta: Secondary | ICD-10-CM | POA: Diagnosis not present

## 2023-04-12 ENCOUNTER — Telehealth: Payer: Self-pay | Admitting: Behavioral Health

## 2023-04-12 NOTE — Telephone Encounter (Signed)
Patient not on Vyvanse, it is vilazodone.

## 2023-04-12 NOTE — Telephone Encounter (Signed)
Patient lvm stating she switch from Lexapro to Vyvanse. She stated she is experiencing extreme fatigue and hot flashes. She is inquiring if the Vyvanse could be the cause of these symptoms.  Contact # [phone]

## 2023-04-13 ENCOUNTER — Encounter: Payer: Self-pay | Admitting: Behavioral Health

## 2023-04-13 ENCOUNTER — Ambulatory Visit (INDEPENDENT_AMBULATORY_CARE_PROVIDER_SITE_OTHER): Payer: PPO | Admitting: Behavioral Health

## 2023-04-13 DIAGNOSIS — F411 Generalized anxiety disorder: Secondary | ICD-10-CM

## 2023-04-13 DIAGNOSIS — R5381 Other malaise: Secondary | ICD-10-CM

## 2023-04-13 DIAGNOSIS — F331 Major depressive disorder, recurrent, moderate: Secondary | ICD-10-CM | POA: Diagnosis not present

## 2023-04-13 MED ORDER — SERTRALINE HCL 50 MG PO TABS: ORAL_TABLET | ORAL | 1 refills | Status: DC

## 2023-04-13 NOTE — Progress Notes (Signed)
[facility]  Patient ID: Emma Lopez,  MRN: [id number]  PCP: Emma Quitter, MD  Date of Evaluation: 04/13/2023 Time spent:30 minutes  Chief Complaint:  Chief Complaint   Depression; Anxiety; Follow-up; Patient Education; Medication Problem; Stress     HISTORY/CURRENT STATUS: HPI "Emma Lopez", 77 year old female presents to this office for follow up and medication management. Collateral information should be considered reliable.  Patient is very anxious today. Says that she started having hotflashes in August. She is wondering if Viibryd could be the cause. She is continuing with severe fatigue and is worried. She is following up with PCP, Endocrinology, and OB/Gyn. Thyroid has been checked.   She reports depression today at 5/10, and anxiety at 6/10.  She is sleeping 7 to 8 hours per night.  Says that she would like to consider medication changes today that may help. She denies any history of mania, no psychosis, no auditory or visual hallucinations, no delirium.  No current suicidal ideation or homicidal ideation.   Past psychiatric medication trials:  Lexapro Cymbalta BuSpar        Individual Medical History/ Review of Systems: Changes? :No   Allergies: Sulfa antibiotics  Current Medications:  Current Outpatient Medications:    sertraline (ZOLOFT) 50 MG tablet, Take 1/2 tablet by mouth for 7 days, then take one whole tablet daily, Disp: 30 tablet, Rfl: 1   ALPRAZolam (XANAX) 0.25 MG tablet, Take 1 tablet (0.25 mg total) by mouth every 8 (eight) hours as needed for anxiety., Disp: 30 tablet, Rfl: 0   BD PEN NEEDLE NANO 2ND GEN 32G X 4 MM MISC, AS DIRECTED WITH TYMLOS DAILY 90 DAYS, Disp: , Rfl:    denosumab (PROLIA) 60 MG/ML SOLN injection, Inject 60 mg into the skin every 6 (six) months. Administer in upper arm, thigh, or abdomen (Patient not taking: Reported on 02/16/2023), Disp: 180 mL, Rfl: 2   enoxaparin (LOVENOX) 60 MG/0.6ML injection, Inject 0.6 mLs (60 mg  total) into the skin every 12 (twelve) hours., Disp: 12 mL, Rfl: 1   Evolocumab (REPATHA SURECLICK) 140 MG/ML SOAJ, Inject 140 mg into the skin every 14 (fourteen) days., Disp: 2 mL, Rfl: 11   hydrOXYzine (ATARAX) 10 MG tablet, TAKE 1 TABLET BY MOUTH THREE TIMES A DAY AS NEEDED, Disp: 270 tablet, Rfl: 0   methocarbamol (ROBAXIN) 500 MG tablet, Take 250 mg by mouth daily as needed for muscle spasms., Disp: , Rfl:    nitroGLYCERIN (NITROSTAT) 0.4 MG SL tablet, Place under the tongue., Disp: , Rfl:    ondansetron (ZOFRAN-ODT) 8 MG disintegrating tablet, Take 1 tablet (8 mg total) by mouth every 8 (eight) hours as needed for nausea., Disp: 20 tablet, Rfl: 0   oxyCODONE (ROXICODONE) 5 MG immediate release tablet, Take 1 tablet (5 mg total) by mouth every 8 (eight) hours as needed for up to 6 doses for breakthrough pain., Disp: 6 tablet, Rfl: 0   rOPINIRole (REQUIP) 1 MG tablet, Take 1 tablet (1 mg total) by mouth at bedtime., Disp: 90 tablet, Rfl: 2   sucralfate (CARAFATE) 1 g tablet, Take 1 tablet (1 g total) by mouth 4 (four) times daily -  with meals and at bedtime., Disp: 60 tablet, Rfl: 0   Vilazodone HCl 20 MG TABS, Take one tablet by mouth daily, Disp: 30 tablet, Rfl: 1   warfarin (COUMADIN) 1 MG tablet, Take 1/2 tablet to 1 tablet by mouth daily or as directed by Anticoagulation Clinic., Disp: 90 tablet, Rfl: 1  Current Facility-Administered  Medications:    denosumab (PROLIA) injection 60 mg, 60 mg, Subcutaneous, Once, Emma Lopez Emma Lobo, MD Medication Side Effects: none  Family Medical/ Social History: Changes? No  MENTAL HEALTH EXAM:  There were no vitals taken for this visit.There is no height or weight on file to calculate BMI.  General Appearance: Casual, Neat, and Well Groomed  Eye Contact:  Good  Speech:  Clear and Coherent  Volume:  Normal  Mood:  Anxious, Depressed, and Dysphoric  Affect:  Appropriate, Depressed, and Anxious  Thought Process:  Coherent  Orientation:   Full (Time, Place, and Person)  Thought Content: Logical   Suicidal Thoughts:  No  Homicidal Thoughts:  No  Memory:  WNL  Judgement:  Good  Insight:  Good  Psychomotor Activity:  Normal  Concentration:  Concentration: Good  Recall:  Good  Fund of Knowledge: Good  Language: Good  Assets:  Desire for Improvement  ADL's:  Intact  Cognition: WNL  Prognosis:  Good    DIAGNOSES:    ICD-10-CM   1. Generalized anxiety disorder  F41.1 sertraline (ZOLOFT) 50 MG tablet    2. Major depressive disorder, recurrent episode, moderate (HCC)  F33.1 sertraline (ZOLOFT) 50 MG tablet    3. Chronic fatigue and malaise  R53.82    R53.81       Receiving Psychotherapy: No    RECOMMENDATIONS:  Greater than 50% of 30 min face to face time with patient was spent on counseling and coordination of care. She is extremely anxious and questions whether medication is causing hot flashes. She is following up with multiple providers in search of answers for fatigue. She does have hx of CFS.  She is hesitant but agrees to switch from [place] to [employer].  She is considering retirement community.   Recommended Mag Glycinate supplement at bedtime.    We agreed to:   Will reduce Viibryd  20 mg daily to 10 mg for 7 days, then every other day for 7 days, then stop To start Zoloft 25 mg for 7 days, then 50 mg daily Stopped  hydroxyzine 10 mg three time daily due to dizzyness. Will report side effects or worsening symptoms promptly Will follow-up in 6 weeks to reassess Provided emergency contact information Reviewed PDMP     Emma Flores, NP

## 2023-04-13 NOTE — Telephone Encounter (Signed)
Called patient and she made an appt to see Arlys John today, will discuss with him.

## 2023-04-17 ENCOUNTER — Ambulatory Visit: Payer: PPO | Admitting: Nurse Practitioner

## 2023-04-20 ENCOUNTER — Other Ambulatory Visit: Payer: Self-pay

## 2023-04-20 ENCOUNTER — Encounter (HOSPITAL_COMMUNITY): Payer: Self-pay | Admitting: Emergency Medicine

## 2023-04-20 ENCOUNTER — Emergency Department (HOSPITAL_COMMUNITY)
Admission: EM | Admit: 2023-04-20 | Discharge: 2023-04-20 | Disposition: A | Discharge status: Home / Self Care | Payer: PPO | Attending: Emergency Medicine | Admitting: Emergency Medicine

## 2023-04-20 DIAGNOSIS — G4489 Other headache syndrome: Secondary | ICD-10-CM | POA: Diagnosis not present

## 2023-04-20 DIAGNOSIS — R5383 Other fatigue: Secondary | ICD-10-CM | POA: Diagnosis not present

## 2023-04-20 DIAGNOSIS — E860 Dehydration: Secondary | ICD-10-CM | POA: Diagnosis not present

## 2023-04-20 DIAGNOSIS — Z7901 Long term (current) use of anticoagulants: Secondary | ICD-10-CM | POA: Diagnosis not present

## 2023-04-20 DIAGNOSIS — R531 Weakness: Secondary | ICD-10-CM | POA: Diagnosis not present

## 2023-04-20 LAB — URINALYSIS, ROUTINE W REFLEX MICROSCOPIC
Bilirubin Urine: NEGATIVE
Glucose, UA: NEGATIVE mg/dL
Hgb urine dipstick: NEGATIVE
Ketones, ur: NEGATIVE mg/dL
Leukocytes,Ua: NEGATIVE
Nitrite: NEGATIVE
Protein, ur: NEGATIVE mg/dL
Specific Gravity, Urine: 1.002 — ABNORMAL LOW (ref 1.005–1.030)
pH: 7 (ref 5.0–8.0)

## 2023-04-20 LAB — BASIC METABOLIC PANEL
Anion gap: 10 (ref 5–15)
BUN: 20 mg/dL (ref 8–23)
CO2: 24 mmol/L (ref 22–32)
Calcium: 8.9 mg/dL (ref 8.9–10.3)
Chloride: 102 mmol/L (ref 98–111)
Creatinine, Ser: 0.52 mg/dL (ref 0.44–1.00)
GFR, Estimated: >60 mL/min (ref >60)
Glucose, Bld: 77 mg/dL (ref 70–99)
Potassium: 3.7 mmol/L (ref 3.5–5.1)
Sodium: 136 mmol/L (ref 135–145)

## 2023-04-20 LAB — CBC
HCT: 46.1 % — ABNORMAL HIGH (ref 36.0–46.0)
Hemoglobin: 15.1 g/dL — ABNORMAL HIGH (ref 12.0–15.0)
MCH: 31.3 pg (ref 26.0–34.0)
MCHC: 32.8 g/dL (ref 30.0–36.0)
MCV: 95.6 fL (ref 80.0–100.0)
Platelets: 260 10*3/uL (ref 150–400)
RBC: 4.82 MIL/uL (ref 3.87–5.11)
RDW: 13.6 % (ref 11.5–15.5)
WBC: 10.6 10*3/uL — ABNORMAL HIGH (ref 4.0–10.5)
nRBC: 0 % (ref 0.0–0.2)

## 2023-04-20 LAB — CBG MONITORING, ED
Glucose-Capillary: 74 mg/dL (ref 70–99)
Glucose-Capillary: 76 mg/dL (ref 70–99)
Glucose-Capillary: 130 mg/dL — ABNORMAL HIGH (ref 70–99)

## 2023-04-20 NOTE — Discharge Instructions (Signed)
The tests today did not show signs of dehydration or other electrolyte abnormalities.  Continue hydrating and supplementing your calorie intake with Pedialyte and Ensure as you have been doing.  Follow-up with your primary care doctor and endocrinologist for further evaluation of your symptoms.

## 2023-04-20 NOTE — ED Triage Notes (Addendum)
Pt BIB EMS from home for wkns and fatigue x3 days, EMS reports pt has not been eating or drinking the past few days. VSS. BP: 180/64 HR: 80, RR:16, SpO2: 98% on RA, Temp: 98 CBG: 203

## 2023-04-20 NOTE — ED Provider Notes (Signed)
[place] [facility]ERGENCY DEPARTMENT AT [facility] Provider Note   CSN: [id number] Arrival date & time: 04/20/23  0531     History  Chief Complaint  Patient presents with   Fatigue    Emma Lopez is a 77 y.o. female.  HPI   Patient has a history of DVT, PE, endometriosis, depression, fibromyalgia, anxiety, restless leg syndrome, irritable bowel syndrome who presents to the ED for evaluation of persistent fatigue.  Patient also has been having intermittent episodes of feeling flushed and hot.  Patient states the symptoms have been going on for several weeks now.  She has not been eating or drinking as much.  She has been having to force herself to supplement her diet with Ensure.  She also has been having to force herself to drink more fluids including water and Pedialyte.  Patient states she initially saw her OB/GYN doctor who referred her to her neurologist as I thought it could be related to her medications.  Patient had thyroid testing that was slightly abnormal so she followed up with Dr. Evlyn Kanner in the office.  Patient was told her thyroid test were normal but the patient was also interested in getting adrenal gland testing.  She was instructed to follow-up in the office for repeat blood testing as it needed to be in the morning.  Patient had not been able to get into the office.  She has not felt safe to drive.  Last night patient was starting to feel more fatigued.  She was concerned that she was getting very dehydrated so she came to the ED.  She has not had any vomiting or diarrhea.  No chest pain no abdominal pain.  Home Medications Prior to Admission medications   Medication Sig Start Date End Date Taking? Authorizing Provider  ALPRAZolam (XANAX) 0.25 MG tablet Take 1 tablet (0.25 mg total) by mouth every 8 (eight) hours as needed for anxiety. 04/05/17   Harrington Challenger, NP  enoxaparin (LOVENOX) 60 MG/0.6ML injection Inject 0.6 mLs (60 mg total) into the skin every 12 (twelve)  hours. 04/06/23   Quintella Reichert, MD  Evolocumab (REPATHA SURECLICK) 140 MG/ML SOAJ Inject 140 mg into the skin every 14 (fourteen) days. 02/17/23   Swinyer, Zachary George, NP  hydrOXYzine (ATARAX) 10 MG tablet TAKE 1 TABLET BY MOUTH THREE TIMES A DAY AS NEEDED 04/02/23   Avelina Laine A, NP  methocarbamol (ROBAXIN) 500 MG tablet Take 250 mg by mouth daily as needed for muscle spasms.    [provider]  nitroGLYCERIN (NITROSTAT) 0.4 MG SL tablet Place under the tongue. 03/03/23   [provider]  ondansetron (ZOFRAN-ODT) 8 MG disintegrating tablet Take 1 tablet (8 mg total) by mouth every 8 (eight) hours as needed for nausea. 02/14/23   Derwood Kaplan, MD  oxyCODONE (ROXICODONE) 5 MG immediate release tablet Take 1 tablet (5 mg total) by mouth every 8 (eight) hours as needed for up to 6 doses for breakthrough pain. 03/17/23   Curatolo, Adam, DO  rOPINIRole (REQUIP) 1 MG tablet Take 1 tablet (1 mg total) by mouth at bedtime. 11/17/22   Dohmeier, Porfirio Mylar, MD  sertraline (ZOLOFT) 50 MG tablet Take 1/2 tablet by mouth for 7 days, then take one whole tablet daily 04/13/23   Avelina Laine A, NP  sucralfate (CARAFATE) 1 g tablet Take 1 tablet (1 g total) by mouth 4 (four) times daily -  with meals and at bedtime. 02/14/23   Derwood Kaplan, MD  Vilazodone  HCl 20 MG TABS Take one tablet by mouth daily 03/08/23   Joan Flores, NP  warfarin (COUMADIN) 1 MG tablet Take 1/2 tablet to 1 tablet by mouth daily or as directed by Anticoagulation Clinic. 06/02/22   Quintella Reichert, MD      Allergies    Sulfa antibiotics    Review of Systems   Review of Systems  Physical Exam Updated Vital Signs BP (!) 147/65 (BP Location: Left Arm)   Pulse 64   Temp 97.9 F (36.6 C) (Oral)   Resp 16   Ht 1.575 m (5\' 2" )   Wt 51.3 kg   SpO2 99%   BMI 20.67 kg/m  Physical Exam Vitals and nursing note reviewed.  Constitutional:      General: She is not in acute distress.    Appearance: She is well-developed.  HENT:      Head: Normocephalic and atraumatic.     Right Ear: External ear normal.     Left Ear: External ear normal.  Eyes:     General: No scleral icterus.       Right eye: No discharge.        Left eye: No discharge.     Conjunctiva/sclera: Conjunctivae normal.  Neck:     Trachea: No tracheal deviation.  Cardiovascular:     Rate and Rhythm: Normal rate and regular rhythm.  Pulmonary:     Effort: Pulmonary effort is normal. No respiratory distress.     Breath sounds: Normal breath sounds. No stridor. No wheezing or rales.  Abdominal:     General: Bowel sounds are normal. There is no distension.     Palpations: Abdomen is soft.     Tenderness: There is no abdominal tenderness. There is no guarding or rebound.  Musculoskeletal:        General: No tenderness or deformity.     Cervical back: Neck supple.  Skin:    General: Skin is warm and dry.     Findings: No rash.  Neurological:     General: No focal deficit present.     Mental Status: She is alert.     Cranial Nerves: No cranial nerve deficit, dysarthria or facial asymmetry.     Sensory: No sensory deficit.     Motor: No abnormal muscle tone or seizure activity.     Coordination: Coordination normal.  Psychiatric:        Mood and Affect: Mood normal.     ED Results / Procedures / Treatments   Labs (all labs ordered are listed, but only abnormal results are displayed) Labs Reviewed  CBC - Abnormal; Notable for the following components:      Result Value   WBC 10.6 (*)    Hemoglobin 15.1 (*)    HCT 46.1 (*)    All other components within normal limits  URINALYSIS, ROUTINE W REFLEX MICROSCOPIC - Abnormal; Notable for the following components:   Color, Urine STRAW (*)    Specific Gravity, Urine 1.002 (*)    All other components within normal limits  CBG MONITORING, ED - Abnormal; Notable for the following components:   Glucose-Capillary 130 (*)    All other components within normal limits  BASIC METABOLIC PANEL   CORTISOL-AM, BLOOD  CBG MONITORING, ED  CBG MONITORING, ED    EKG EKG Interpretation Date/Time:  Thursday April 20 2023 05:47:17 EDT Ventricular Rate:  62 PR Interval:  127 QRS Duration:  107 QT Interval:  404 QTC Calculation: 411 R Axis:  60  Text Interpretation: Sinus rhythm Atrial premature complex No significant change since last tracing Confirmed by Linwood Dibbles [phone]) on 04/20/2023 7:53:21 AM  Radiology No results found.  Procedures Procedures    Medications Ordered in ED Medications - No data to display  ED Course/ Medical Decision Making/ A&P                                 Medical Decision Making Problems Addressed: Other fatigue: undiagnosed new problem with uncertain prognosis  Amount and/or Complexity of Data Reviewed Labs: ordered. Decision-making details documented in ED Course.    Details: Labs reviewed.  No significant abnormalities.  No evidence of dehydration.  Urine specific gravity is low   Patient has been having issues with persistent fatigue.  She has had decreased appetite.  Patient is currently seeing an endocrinologist.  She has plans for adrenal testing.  Patient states she actually has an appointment tomorrow and a friend is going to take her.  Her ED workup is reassuring.  She does not have any evidence of dehydration.  In fact her urine specific gravity is actually low.  Patient is not having any focal signs of weakness.  She does not have any significant lab abnormalities.  She is not having any pain or tenderness.  Unclear etiology of her symptoms but at this time no signs of any emergent condition.  Patient appears stable for continued outpatient follow-up with her primary care doctor and endocrinologist.        Final Clinical Impression(s) / ED Diagnoses Final diagnoses:  Other fatigue    Rx / DC Orders ED Discharge Orders     None         Linwood Dibbles, MD 04/20/23 [phone]

## 2023-04-21 ENCOUNTER — Telehealth: Payer: Self-pay | Admitting: *Deleted

## 2023-04-21 DIAGNOSIS — N951 Menopausal and female climacteric states: Secondary | ICD-10-CM | POA: Diagnosis not present

## 2023-04-21 LAB — MISC LABCORP TEST (SEND OUT): Labcorp test code: 104018

## 2023-04-21 NOTE — Telephone Encounter (Signed)
Pt called and stated that her appt for 10/1 was cancelled. Pt is not going to bridge with Lovenox and will continue her normal dose of warfarin.  Will keep appt for 05/08/2023.

## 2023-04-24 ENCOUNTER — Encounter: Payer: Self-pay | Admitting: Family

## 2023-04-25 ENCOUNTER — Telehealth: Payer: Self-pay | Admitting: Behavioral Health

## 2023-04-25 ENCOUNTER — Telehealth: Payer: Self-pay | Admitting: Cardiology

## 2023-04-25 NOTE — Telephone Encounter (Signed)
Please see message.  From last appt 9/12:   Will reduce Viibryd  20 mg daily to 10 mg for 7 days, then every other day for 7 days, then stop To start Zoloft 25 mg for 7 days, then 50 mg daily Stopped  hydroxyzine 10 mg three time daily due to dizzyness. Will report side effects or worsening symptoms promptly Will follow-up in 6 weeks to reassess

## 2023-04-25 NOTE — Telephone Encounter (Signed)
Huntley Dec, Cousin-POA - (roi is on file) called at 4:30 to report that talking with Willisha she feels Emma Lopez is severely depressed and is declining in her health.  She is  not taking take of herself, not bathing, not eating properly, dehydrated, etc.  She is concerned for her.  She feels if she takes her to the Urgent Care or a facility it will make things worse.  Huntley Dec is asking for a call to discuss the situation and give her guidance.  She does think the recently medication change has helped.  Next appt is 10/24.  Sara's number is [phone]

## 2023-04-25 NOTE — Telephone Encounter (Signed)
Pt c/o medication issue:  1. Name of Medication: Lipitor 2. How are you currently taking this medication (dosage and times per day)? As Written  3. Are you having a reaction (difficulty breathing--STAT)? No  4. What is your medication issue? Patient's POA Huntley Dec is requesting to speak with a nurse in regards to the patient taking this medication. Huntley Dec stated that she had questions about the side effects. Please advise.

## 2023-04-25 NOTE — Telephone Encounter (Signed)
Spoke with Huntley Dec per DPR, she states patient complains of severe hot flashes and feel she feels like she can hardly function. She has become a frequent flyer in the ED. She states she knows her description but that is what the patient is stated. She would like to know if she should stop lipitor.  Did advise lipitor is not on patient current medication list. The only medication she takes for cholesterol is repatha.  She states patient does like to be catered to and waited on because all of her test that patient has been having been negative in the ED.

## 2023-04-26 ENCOUNTER — Encounter (HOSPITAL_COMMUNITY): Payer: Self-pay

## 2023-04-26 ENCOUNTER — Other Ambulatory Visit: Payer: Self-pay

## 2023-04-26 ENCOUNTER — Emergency Department (HOSPITAL_COMMUNITY)
Admission: EM | Admit: 2023-04-26 | Discharge: 2023-04-29 | Disposition: A | Discharge status: Other Acute Inpatient Hospital | Payer: PPO | Attending: Emergency Medicine | Admitting: Emergency Medicine

## 2023-04-26 DIAGNOSIS — Z7901 Long term (current) use of anticoagulants: Secondary | ICD-10-CM | POA: Diagnosis not present

## 2023-04-26 DIAGNOSIS — E860 Dehydration: Secondary | ICD-10-CM | POA: Diagnosis not present

## 2023-04-26 DIAGNOSIS — R55 Syncope and collapse: Secondary | ICD-10-CM | POA: Diagnosis not present

## 2023-04-26 DIAGNOSIS — R531 Weakness: Secondary | ICD-10-CM | POA: Diagnosis not present

## 2023-04-26 DIAGNOSIS — R457 State of emotional shock and stress, unspecified: Secondary | ICD-10-CM | POA: Diagnosis not present

## 2023-04-26 DIAGNOSIS — F329 Major depressive disorder, single episode, unspecified: Secondary | ICD-10-CM | POA: Diagnosis present

## 2023-04-26 DIAGNOSIS — R630 Anorexia: Secondary | ICD-10-CM | POA: Diagnosis not present

## 2023-04-26 DIAGNOSIS — Z79899 Other long term (current) drug therapy: Secondary | ICD-10-CM | POA: Diagnosis not present

## 2023-04-26 DIAGNOSIS — F32A Depression, unspecified: Secondary | ICD-10-CM | POA: Diagnosis not present

## 2023-04-26 DIAGNOSIS — I10 Essential (primary) hypertension: Secondary | ICD-10-CM | POA: Diagnosis not present

## 2023-04-26 DIAGNOSIS — F332 Major depressive disorder, recurrent severe without psychotic features: Secondary | ICD-10-CM | POA: Diagnosis not present

## 2023-04-26 DIAGNOSIS — R45851 Suicidal ideations: Secondary | ICD-10-CM | POA: Insufficient documentation

## 2023-04-26 DIAGNOSIS — R350 Frequency of micturition: Secondary | ICD-10-CM | POA: Diagnosis not present

## 2023-04-26 LAB — URINALYSIS, ROUTINE W REFLEX MICROSCOPIC
Bilirubin Urine: NEGATIVE
Glucose, UA: NEGATIVE mg/dL
Hgb urine dipstick: NEGATIVE
Ketones, ur: NEGATIVE mg/dL
Leukocytes,Ua: NEGATIVE
Nitrite: NEGATIVE
Protein, ur: NEGATIVE mg/dL
Specific Gravity, Urine: 1.011 (ref 1.005–1.030)
pH: 6 (ref 5.0–8.0)

## 2023-04-26 LAB — RAPID URINE DRUG SCREEN, HOSP PERFORMED
Amphetamines: NOT DETECTED
Barbiturates: NOT DETECTED
Benzodiazepines: NOT DETECTED
Cocaine: NOT DETECTED
Opiates: NOT DETECTED
Tetrahydrocannabinol: NOT DETECTED

## 2023-04-26 LAB — CBC WITH DIFFERENTIAL/PLATELET
Abs Immature Granulocytes: 0.06 10*3/uL (ref 0.00–0.07)
Basophils Absolute: 0.1 10*3/uL (ref 0.0–0.1)
Basophils Relative: 1 %
Eosinophils Absolute: 0.1 10*3/uL (ref 0.0–0.5)
Eosinophils Relative: 1 %
HCT: 49.9 % — ABNORMAL HIGH (ref 36.0–46.0)
Hemoglobin: 16.5 g/dL — ABNORMAL HIGH (ref 12.0–15.0)
Immature Granulocytes: 1 %
Lymphocytes Relative: 15 %
Lymphs Abs: 1.5 10*3/uL (ref 0.7–4.0)
MCH: 31.4 pg (ref 26.0–34.0)
MCHC: 33.1 g/dL (ref 30.0–36.0)
MCV: 95 fL (ref 80.0–100.0)
Monocytes Absolute: 0.9 10*3/uL (ref 0.1–1.0)
Monocytes Relative: 10 %
Neutro Abs: 7.1 10*3/uL (ref 1.7–7.7)
Neutrophils Relative %: 72 %
Platelets: 270 10*3/uL (ref 150–400)
RBC: 5.25 MIL/uL — ABNORMAL HIGH (ref 3.87–5.11)
RDW: 13.9 % (ref 11.5–15.5)
WBC: 9.7 10*3/uL (ref 4.0–10.5)
nRBC: 0 % (ref 0.0–0.2)

## 2023-04-26 LAB — ETHANOL: Alcohol, Ethyl (B): <10 mg/dL (ref <10)

## 2023-04-26 LAB — COMPREHENSIVE METABOLIC PANEL
ALT: 26 U/L (ref 0–44)
AST: 26 U/L (ref 15–41)
Albumin: 3.7 g/dL (ref 3.5–5.0)
Alkaline Phosphatase: 68 U/L (ref 38–126)
Anion gap: 11 (ref 5–15)
BUN: 22 mg/dL (ref 8–23)
CO2: 25 mmol/L (ref 22–32)
Calcium: 9.1 mg/dL (ref 8.9–10.3)
Chloride: 100 mmol/L (ref 98–111)
Creatinine, Ser: 0.56 mg/dL (ref 0.44–1.00)
GFR, Estimated: >60 mL/min (ref >60)
Glucose, Bld: 106 mg/dL — ABNORMAL HIGH (ref 70–99)
Potassium: 3.8 mmol/L (ref 3.5–5.1)
Sodium: 136 mmol/L (ref 135–145)
Total Bilirubin: 0.6 mg/dL (ref 0.3–1.2)
Total Protein: 7.4 g/dL (ref 6.5–8.1)

## 2023-04-26 LAB — PROTIME-INR
INR: 3.9 — ABNORMAL HIGH (ref 0.8–1.2)
Prothrombin Time: 38.1 seconds — ABNORMAL HIGH (ref 11.4–15.2)

## 2023-04-26 MED ORDER — SUCRALFATE 1 G PO TABS
1.0000 g | ORAL_TABLET | Freq: Three times a day (TID) | ORAL | Status: DC
  Administered 2023-04-27 – 2023-04-28 (×2): 1 g via ORAL
  Filled 2023-04-26 (×12): qty 1

## 2023-04-26 MED ORDER — OXYCODONE HCL 5 MG PO TABS: 5.0000 mg | ORAL_TABLET | Freq: Three times a day (TID) | ORAL | Status: DC | PRN

## 2023-04-26 MED ORDER — ONDANSETRON 8 MG PO TBDP: 8.0000 mg | ORAL_TABLET | Freq: Three times a day (TID) | ORAL | Status: DC | PRN

## 2023-04-26 MED ORDER — HYDROXYZINE HCL 10 MG PO TABS
10.0000 mg | ORAL_TABLET | Freq: Three times a day (TID) | ORAL | Status: DC | PRN
  Administered 2023-04-26: 10 mg via ORAL
  Filled 2023-04-26: qty 1

## 2023-04-26 MED ORDER — LACTATED RINGERS IV BOLUS
1000.0000 mL | Freq: Once | INTRAVENOUS | Status: AC
  Administered 2023-04-26: 1000 mL via INTRAVENOUS

## 2023-04-26 MED ORDER — SERTRALINE HCL 50 MG PO TABS
50.0000 mg | ORAL_TABLET | Freq: Every day | ORAL | Status: DC
  Administered 2023-04-26 – 2023-04-29 (×4): 50 mg via ORAL
  Filled 2023-04-26 (×5): qty 1

## 2023-04-26 MED ORDER — ROPINIROLE HCL 1 MG PO TABS
1.0000 mg | ORAL_TABLET | Freq: Every day | ORAL | Status: DC
  Administered 2023-04-26 – 2023-04-28 (×3): 1 mg via ORAL
  Filled 2023-04-26 (×3): qty 1

## 2023-04-26 MED ORDER — LACTATED RINGERS IV SOLN: INTRAVENOUS | Status: DC

## 2023-04-26 MED ORDER — ROPINIROLE HCL 1 MG PO TABS: 1.0000 mg | ORAL_TABLET | Freq: Every day | ORAL | Status: DC

## 2023-04-26 MED ORDER — ALPRAZOLAM 0.5 MG PO TABS
0.2500 mg | ORAL_TABLET | Freq: Three times a day (TID) | ORAL | Status: DC | PRN
  Administered 2023-04-26: 0.25 mg via ORAL
  Filled 2023-04-26: qty 1

## 2023-04-26 MED ORDER — WARFARIN 0.5 MG HALF TABLET: 0.5000 mg | ORAL_TABLET | Freq: Every day | ORAL | Status: DC

## 2023-04-26 MED ORDER — METHOCARBAMOL 500 MG PO TABS: 250.0000 mg | ORAL_TABLET | Freq: Every day | ORAL | Status: DC | PRN

## 2023-04-26 MED ORDER — BUPROPION HCL ER (SR) 100 MG PO TB12
100.0000 mg | ORAL_TABLET | Freq: Every day | ORAL | Status: DC
  Administered 2023-04-26 – 2023-04-29 (×4): 100 mg via ORAL
  Filled 2023-04-26 (×4): qty 1

## 2023-04-26 MED ORDER — ROPINIROLE HCL 0.5 MG PO TABS
0.5000 mg | ORAL_TABLET | Freq: Once | ORAL | Status: AC
  Administered 2023-04-26: 0.5 mg via ORAL
  Filled 2023-04-26: qty 1

## 2023-04-26 NOTE — Telephone Encounter (Signed)
Noted. Reviewed information with Arlys John and looks like pt in [facility] due to dehydration and now getting fluids. Will contact her POA and get more information.

## 2023-04-26 NOTE — Consult Note (Signed)
[facility] ED ASSESSMENT   Reason for Consult:  Psych consult Referring Physician:  Lorre Nick Patient Identification: Emma Lopez MRN:  [id number] ED Chief Complaint: Major depressive disorder with current active episode  Diagnosis:  Principal Problem:   Major depressive disorder with current active episode   ED Assessment Time Calculation: Start Time: 1310 Stop Time: 1424 Total Time in Minutes (Assessment Completion): 74  HPI:  Emma Lopez is a 77 y.o. female patient brought in by EMS for dehydration.  Patient reports fatigue, weakness, sadness and hopelessness. She has a history of DVTs, GERD, thyroid disease, chronic fatigue, depression and anxiety.  Subjective:   Emma Lopez is a 77 y.o. female patient brought in by EMS for dehydration.  Patient reports fatigue, weakness, sadness and hopelessness. She has a history of DVTs, GERD, thyroid disease, chronic fatigue, depression and anxiety. Patient is accompanied by her niece who is also a POA.   Emma Lopez, 46 y.o., female patient seen face to face by this provider, consulted with Dr. Lucianne Muss; and chart reviewed on 04/26/23.  On evaluation Emma Lopez reports she has a long history of depression and anxiety that has never been well controlled with her medications.  In the last year, it has gotten progressively worse.  She has experienced a lot of loss and is struggling with the changes of aging. She is experiencing intense feelings of lonliness and lack of purpose.  She has experienced at least 20 pounds of weight loss in the last year. She isn't sleeping well and is too tired to cook for herself.  She needed IV fluids due to dehydration. The depression is causing a failure to thrive.    During evaluation Emma Lopez is sitting on the stretcher (position) in no acute distress.  She is alert, oriented x 4, calm, cooperative and attentive.  Her mood is depressed, anxious and hopeless with congruent affect. She has normal speech, and  behavior.  Objectively there is no evidence of psychosis/mania or delusional thinking.  Patient is able to converse coherently, goal directed thoughts, no distractibility, or pre-occupation.  She also denies active suicidal ideation/self-harm/homicidal ideation, psychosis, and paranoia. She does present with passive suicidal ideation by saying she thinks it would just be better if she isn't here; there is no intent or plan. Patient answered questions appropriately.    Patient is a danger to herself through passive suicidal ideation and failure to thrive.  She requires inpatient psychiatric hospitalization for stabilization and treatment.     Past Psychiatric History: depression and anxiety  Risk to Self or Others: Is the patient at risk to self? Yes Has the patient been a risk to self in the past 6 months? No Has the patient been a risk to self within the distant past? No Is the patient a risk to others? No Has the patient been a risk to others in the past 6 months? No Has the patient been a risk to others within the distant past? No  [place] Scale:  Flowsheet Row ED from 04/26/2023 in [facility] Emergency Department at [facility] ED from 04/20/2023 in [facility] Department at [facility] ED from 03/17/2023 in [facility] Emergency Department at [facility]  C-SSRS RISK CATEGORY No Risk No Risk No Risk        Substance Abuse:   None noted  Past Medical History:  Past Medical History:  Diagnosis Date   Abnormal EKG    Anxiety  Arthritis    oa   Bradycardia 12/14/2017   CIN III (cervical intraepithelial neoplasia III) 2000   Complication of anesthesia    did well last 2 times with procedures   Depression    DES exposure in utero    DVT (deep venous thrombosis) (HCC) 01/2009   LEFT LEG   DVT (deep venous thrombosis) (HCC) 10/30/2020   Dyspnea on exertion 10/2017   Elevated triglycerides with high cholesterol    Endometriosis     Fibromyalgia    GERD (gastroesophageal reflux disease)    History of colon polyps    History of hiatal hernia    told by some md has, some say not   IBS (irritable bowel syndrome) 2015   Left bundle branch block    intermittent   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  low TSH 0.22 on 03/24/23.   Vitamin D deficiency     Past Surgical History:  Procedure Laterality Date   ABDOMINAL HYSTERECTOMY  2001   TAH, partial   APPENDECTOMY  1978   COLONOSCOPY WITH PROPOFOL N/A 12/07/2015   Procedure: COLONOSCOPY WITH PROPOFOL;  Surgeon: Charolett Bumpers, MD;  Location: [facility];  Service: Endoscopy;  Laterality: N/A;   COLONOSCOPY WITH PROPOFOL N/A 01/11/2022   Procedure: COLONOSCOPY WITH PROPOFOL;  Surgeon: Kerin Salen, MD;  Location: [facility];  Service: Gastroenterology;  Laterality: N/A;   colonscopy  7 yrs ago   other in past   ESOPHAGOGASTRODUODENOSCOPY (EGD) WITH PROPOFOL N/A 12/07/2015   Procedure: ESOPHAGOGASTRODUODENOSCOPY (EGD) WITH PROPOFOL;  Surgeon: Charolett Bumpers, MD;  Location: [facility];  Service: Endoscopy;  Laterality: N/A;   ESOPHAGOGASTRODUODENOSCOPY ENDOSCOPY  yrs ago   FACIAL COSMETIC SURGERY     GYNECOLOGIC CRYOSURGERY     HEMOSTASIS CLIP PLACEMENT  01/11/2022   Procedure: HEMOSTASIS CLIP PLACEMENT;  Surgeon: Kerin Salen, MD;  Location: [facility];  Service: Gastroenterology;;   HOT HEMOSTASIS N/A 01/11/2022   Procedure: HOT HEMOSTASIS (ARGON PLASMA COAGULATION/BICAP);  Surgeon: Kerin Salen, MD;  [facility];  Service: Gastroenterology;  Laterality: N/A;   MYOMECTOMY     POLYPECTOMY  01/11/2022    Procedure: POLYPECTOMY;  Surgeon: Kerin Salen, MD;  Location: [facility];  Service: Gastroenterology;;   REPLACEMENT TOTAL JOINT WRIST W/ PROSTHETIC IMPLANT Right 08/01/2021   TONSILLECTOMY     Family History:  Family History  Problem Relation Age of Onset   Depression Mother    Anxiety disorder Mother    Hypertension Mother    Breast cancer Mother        60's   Cancer Father        COLON   Depression Sister    Anxiety disorder Sister    Hypertension Sister    Stroke Sister    Breast cancer Maternal Aunt        Age 33's   Cancer Maternal Aunt        Melanoma   Alzheimer's disease Maternal Aunt    Breast cancer Maternal Aunt        70's   Cancer Maternal Aunt  Colon CA   Alzheimer's disease Maternal Aunt    Cancer Paternal Aunt        OVARIAN and COLON   Family Psychiatric  History: family history of depression that has been successfully treated with ECT Social History:  Social History   Substance and Sexual Activity  Alcohol Use Yes   Comment: 1 drink per month     Social History   Substance and Sexual Activity  Drug Use No    Social History   Socioeconomic History   Marital status: Single    Spouse name: Not on file   Number of children: 0   Years of education: 16   Highest education level: Bachelor's degree (e.g., BA, AB, BS)  Occupational History   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  Tobacco Use   Smoking status: Former    Current packs/day: 0.00    Average packs/day: 0.1 packs/day for 6.0 years (0.6 ttl pk-yrs)    Types: Cigarettes    Start date: 08/01/1972    Quit date: 08/01/1978    Years since quitting: 44.7   Smokeless tobacco: Never   Tobacco comments:    only smoked 4-6 yrs 1 pack per month  Vaping Use   Vaping status: Never Used  Substance and Sexual Activity   Alcohol use: Yes    Comment: 1 drink per month   Drug use: No   Sexual activity: Not Currently    Birth control/protection: Surgical    Comment:  HYSTERECTOMY-1st intercourse 27-Fewer than 5 partners  Other Topics Concern   Not on file  Social History Narrative   Lives alone in [place].    Social Determinants of Health   Financial Resource Strain: Not on file  Food Insecurity: Not on file  Transportation Needs: Not on file  Physical Activity: Not on file  Stress: Not on file  Social Connections: Not on file   Additional Social History: Patient lives alone    Allergies:   Allergies  Allergen Reactions   Sulfa Antibiotics Rash and Other (See Comments)    Labs:  Results for orders placed or performed during the hospital encounter of 04/26/23 (from the past 48 hour(s))  Urinalysis, Routine w reflex microscopic -Urine, Clean Catch     Status: Abnormal   Collection Time: 04/26/23  9:48 AM  Result Value Ref Range   Color, Urine STRAW (A) YELLOW   APPearance CLEAR CLEAR   Specific Gravity, Urine 1.011 1.005 - 1.030   pH 6.0 5.0 - 8.0   Glucose, UA NEGATIVE NEGATIVE mg/dL   Hgb urine dipstick NEGATIVE NEGATIVE   Bilirubin Urine NEGATIVE NEGATIVE   Ketones, ur NEGATIVE NEGATIVE mg/dL   Protein, ur NEGATIVE NEGATIVE mg/dL   Nitrite NEGATIVE NEGATIVE   Leukocytes,Ua NEGATIVE NEGATIVE    Comment: Performed at [facility], [address] [place], [place] [address]  Rapid urine drug screen (hospital performed)     Status: None   Collection Time: 04/26/23  9:48 AM  Result Value Ref Range   Opiates NONE DETECTED NONE DETECTED   Cocaine NONE DETECTED NONE DETECTED   Benzodiazepines NONE DETECTED NONE DETECTED   Amphetamines NONE DETECTED NONE DETECTED   Tetrahydrocannabinol NONE DETECTED NONE DETECTED   Barbiturates NONE DETECTED NONE DETECTED    Comment: (NOTE) DRUG SCREEN FOR MEDICAL PURPOSES ONLY.  IF CONFIRMATION IS NEEDED FOR ANY PURPOSE, NOTIFY LAB WITHIN 5 DAYS.  LOWEST DETECTABLE LIMITS FOR URINE DRUG SCREEN Drug Class  Cutoff (ng/mL) Amphetamine and metabolites     1000 Barbiturate and metabolites    200 Benzodiazepine                 200 Opiates and metabolites        300 Cocaine and metabolites        300 THC                            50 Performed at [facility], [address], [place], [place] [address]   CBC with Differential/Platelet     Status: Abnormal   Collection Time: 04/26/23 10:06 AM  Result Value Ref Range   WBC 9.7 4.0 - 10.5 K/uL   RBC 5.25 (H) 3.87 - 5.11 MIL/uL   Hemoglobin 16.5 (H) 12.0 - 15.0 g/dL   HCT 60.4 (H) 54.0 - 98.1 %   MCV 95.0 80.0 - 100.0 fL   MCH 31.4 26.0 - 34.0 pg   MCHC 33.1 30.0 - 36.0 g/dL   RDW 19.1 47.8 - 29.5 %   Platelets 270 150 - 400 K/uL   nRBC 0.0 0.0 - 0.2 %   Neutrophils Relative % 72 %   Neutro Abs 7.1 1.7 - 7.7 K/uL   Lymphocytes Relative 15 %   Lymphs Abs 1.5 0.7 - 4.0 K/uL   Monocytes Relative 10 %   Monocytes Absolute 0.9 0.1 - 1.0 K/uL   Eosinophils Relative 1 %   Eosinophils Absolute 0.1 0.0 - 0.5 K/uL   Basophils Relative 1 %   Basophils Absolute 0.1 0.0 - 0.1 K/uL   Immature Granulocytes 1 %   Abs Immature Granulocytes 0.06 0.00 - 0.07 K/uL    Comment: Performed at [facility], [address] [place], [place] [address]  Comprehensive metabolic panel     Status: Abnormal   Collection Time: 04/26/23 10:06 AM  Result Value Ref Range   Sodium 136 135 - 145 mmol/L   Potassium 3.8 3.5 - 5.1 mmol/L   Chloride 100 98 - 111 mmol/L   CO2 25 22 - 32 mmol/L   Glucose, Bld 106 (H) 70 - 99 mg/dL    Comment: Glucose reference range applies only to samples taken after fasting for at least 8 hours.   BUN 22 8 - 23 mg/dL   Creatinine, Ser 8.65 0.44 - 1.00 mg/dL   Calcium 9.1 8.9 - 78.4 mg/dL   Total Protein 7.4 6.5 - 8.1 g/dL   Albumin 3.7 3.5 - 5.0 g/dL   AST 26 15 - 41 U/L   ALT 26 0 - 44 U/L   Alkaline Phosphatase 68 38 - 126 U/L   Total Bilirubin 0.6 0.3 - 1.2 mg/dL   GFR, Estimated >69 >62 mL/min    Comment: (NOTE) Calculated using the  CKD-EPI Creatinine Equation (2021)    Anion gap 11 5 - 15    Comment: Performed at [facility], [address] [place], [place] [address]  Protime-INR     Status: Abnormal   Collection Time: 04/26/23 10:06 AM  Result Value Ref Range   Prothrombin Time 38.1 (H) 11.4 - 15.2 seconds   INR 3.9 (H) 0.8 - 1.2    Comment: (NOTE) INR goal varies based on device and disease states. Performed at [facility], [address], [place], [place] [address]   Ethanol     Status: None   Collection Time: 04/26/23 10:06 AM  Result Value Ref Range   Alcohol, Ethyl (B) <10 <10 mg/dL    Comment: (NOTE) Lowest detectable limit for serum alcohol is 10 mg/dL.  For medical purposes only. Performed at [facility], [address], [place], [place] [address]     Current Facility-Administered Medications  Medication Dose Route Frequency Provider Last Rate Last Admin   ALPRAZolam Prudy Feeler) tablet 0.25 mg  0.25 mg Oral Q8H PRN Lorre Nick, MD       denosumab (PROLIA) injection 60 mg  60 mg Subcutaneous Once Patton Salles, MD       hydrOXYzine (ATARAX) tablet 10 mg  10 mg Oral TID PRN Lorre Nick, MD       methocarbamol (ROBAXIN) tablet 250 mg  250 mg Oral Daily PRN Lorre Nick, MD       ondansetron (ZOFRAN-ODT) disintegrating tablet 8 mg  8 mg Oral Q8H PRN Lorre Nick, MD       oxyCODONE (Oxy IR/ROXICODONE) immediate release tablet 5 mg  5 mg Oral Q8H PRN Lorre Nick, MD       rOPINIRole (REQUIP) tablet 1 mg  1 mg Oral QHS Lorre Nick, MD   1 mg at 04/26/23 1145   sertraline (ZOLOFT) tablet 50 mg  50 mg Oral Daily Lorre Nick, MD   50 mg at 04/26/23 1145   sucralfate (CARAFATE) tablet 1 g  1 g Oral TID WC & HS Lorre Nick, MD       Current Outpatient Medications  Medication Sig Dispense Refill   ALPRAZolam (XANAX) 0.25 MG tablet Take 1 tablet (0.25 mg total) by mouth every 8 (eight) hours as needed for anxiety. 30  tablet 0   enoxaparin (LOVENOX) 60 MG/0.6ML injection Inject 0.6 mLs (60 mg total) into the skin every 12 (twelve) hours. 12 mL 1   Evolocumab (REPATHA SURECLICK) 140 MG/ML SOAJ Inject 140 mg into the skin every 14 (fourteen) days. 2 mL 11   hydrOXYzine (ATARAX) 10 MG tablet TAKE 1 TABLET BY MOUTH THREE TIMES A DAY AS NEEDED 270 tablet 0   methocarbamol (ROBAXIN) 500 MG tablet Take 250 mg by mouth daily as needed for muscle spasms.     nitroGLYCERIN (NITROSTAT) 0.4 MG SL tablet Place under the tongue.     ondansetron (ZOFRAN-ODT) 8 MG disintegrating tablet Take 1 tablet (8 mg total) by mouth every 8 (eight) hours as needed for nausea. 20 tablet 0   oxyCODONE (ROXICODONE) 5 MG immediate release tablet Take 1 tablet (5 mg total) by mouth every 8 (eight) hours as needed for up to 6 doses for breakthrough pain. 6 tablet 0   rOPINIRole (REQUIP) 1 MG tablet Take 1 tablet (1 mg total) by mouth at bedtime. 90 tablet 2   sertraline (ZOLOFT) 50 MG tablet Take 1/2 tablet by mouth for 7 days, then take one whole tablet daily 30 tablet 1   sucralfate (CARAFATE) 1 g tablet Take 1 tablet (1 g total) by mouth 4 (four) times daily -  with meals and at bedtime. 60 tablet 0   Vilazodone HCl 20 MG TABS Take one tablet by mouth daily 30 tablet 1   warfarin (COUMADIN) 1 MG tablet Take 1/2 tablet to 1 tablet by mouth daily or as directed by Anticoagulation Clinic. 90 tablet 1    Musculoskeletal: Strength & Muscle Tone: within normal limits Gait & Station: normal Patient leans: N/A   Psychiatric Specialty Exam: Presentation  General Appearance:  Disheveled  Eye Contact: Good  Speech: Clear  and Coherent  Speech Volume: Normal  Handedness: Right   Mood and Affect  Mood: Depressed; Hopeless  Affect: Congruent   Thought Process  Thought Processes: Coherent  Descriptions of Associations:Intact  Orientation:Full (Time, Place and Person)  Thought Content:WDL  History of  Schizophrenia/Schizoaffective disorder:No data recorded Duration of Psychotic Symptoms:No data recorded Hallucinations:Hallucinations: None  Ideas of Reference:None  Suicidal Thoughts:Suicidal Thoughts: Yes, Passive SI Passive Intent and/or Plan: Without Intent; Without Plan  Homicidal Thoughts:Homicidal Thoughts: No   Sensorium  Memory: Immediate Good; Recent Good; Remote Good  Judgment: Intact  Insight: Fair   Art therapist  Concentration: Fair  Attention Span: Fair  Recall: Good  Fund of Knowledge: Good  Language: Good   Psychomotor Activity  Psychomotor Activity: Psychomotor Activity: Normal   Assets  Assets: Communication Skills; Desire for Improvement; Financial Resources/Insurance; Housing; Social Support    Sleep  Sleep: Sleep: Fair Number of Hours of Sleep: 6   Physical Exam: Physical Exam Eyes:     Pupils: Pupils are equal, round, and reactive to light.  Pulmonary:     Effort: Pulmonary effort is normal.  Neurological:     Mental Status: She is alert and oriented to person, place, and time.    Review of Systems  Neurological:        "I have restless legs and neuropathy in my feet"  All other systems reviewed and are negative.  Blood pressure (!) 148/102, pulse 65, temperature 97.9 F (36.6 C), resp. rate 16, height 5\' 2"  (1.575 m), weight 51.3 kg, SpO2 100%. Body mass index is 20.69 kg/m.  Medical Decision Making: Patient case reviewed and discussed with Dr Lucianne Muss.  Patient is a danger to herself through passive suicidal ideation and failure to thrive.  She requires inpatient psychiatric hospitalization for stabilization and treatment.    Problem 1: Major depressive episode  - recommend inpatient psychiatric hospitalization - restart zoloft 50mg  PO Q day - start wellbutrin SR 100mg  PO Q day - assess for tolerability for 3 days then increase to BID or change to XR daily   Disposition:  Recommend inpatient psychiatric  hospitalization for stabilization and treatment.    Thomes Lolling, NP 04/26/2023 2:39 PM

## 2023-04-26 NOTE — ED Notes (Signed)
Patient's suitcase placed in locker 27.

## 2023-04-26 NOTE — Telephone Encounter (Signed)
Pts cousin/ caregiver advised and will make her a PCP OV sooner than her 05/18/23 appt.

## 2023-04-26 NOTE — Telephone Encounter (Signed)
She reported she had stopped her statin at office visit with Malena Peer on 02/16/23. She has a follow-up visit with Robet Leu, PA on 05/18/23. Would recommend daughter come to that visit or that they make an appointment with PCP prior to that for symptoms described.

## 2023-04-26 NOTE — Progress Notes (Signed)
LCSW Progress Note  [id number]   Emma Lopez  04/26/2023  3:11 PM  Description:   Inpatient Psychiatric Referral  Patient was recommended inpatient per Phebe Colla, NP. There are no available beds at [facility] unit, per [facility] Rona Ravens, RN. Patient was referred to the following out of network facilities:   [facility] Provider Address Phone Fax  [facility]  [address] [place] [address] [phone]  [facility]  [address] [place] [address] [phone]  [facility]  [address] [place] [address] [phone]  [facility]  [address] [place] [address] [phone]  [facility]  [address] [place] [address] [phone]  [facility]  [address], [place] [address] [phone]  [facility]  [place] [phone]  [facility]  [address] [place] [address] [phone]  [facility]  [address], [place] [address] [phone]  [facility]  [place] [phone]  [facility]  [address] [place] [address] [phone]  [facility]  [address] [place], [place] [address] [phone]    Situation ongoing, CSW to continue following and update chart as more information becomes available.      Cathie Beams, [place]  04/26/2023 3:11 PM

## 2023-04-26 NOTE — Telephone Encounter (Signed)
Rtc to pt's POA Sarah, we discussed at length the situation and other factors involved. Pt is in the ER now with another family member there communicating with Maralyn Sago. Pt was just seen in the ER last week again on 04/20/23. She was released at that time and sent home.   Will discuss history and concerns with Arlys John. Informed Maralyn Sago that we would follow up on what the doctors decide to do with her and for Maralyn Sago to call back with any further questions or concerns.

## 2023-04-26 NOTE — Progress Notes (Signed)
ANTICOAGULATION CONSULT NOTE - Initial Consult  Pharmacy Consult for Coumadin Indication:  h/o VTE  Allergies  Allergen Reactions   Sulfa Antibiotics Rash and Other (See Comments)    Patient Measurements: Height: 5\' 2"  (157.5 cm) Weight: 51.3 kg (113 lb 1.5 oz) IBW/kg (Calculated) : 50.1  Vital Signs: Temp: 97.9 F (36.6 C) (09/25 0912) BP: 148/102 (09/25 0912) Pulse Rate: 65 (09/25 0912)  Labs: Recent Labs    04/26/23 1006  HGB 16.5*  HCT 49.9*  PLT 270  LABPROT 38.1*  INR 3.9*  CREATININE 0.56    Estimated Creatinine Clearance: 48.1 mL/min (by C-G formula based on SCr of 0.56 mg/dL).   Medical History: Past Medical History:  Diagnosis Date   Abnormal EKG    Anxiety    Arthritis    oa   Bradycardia 12/14/2017   CIN III (cervical intraepithelial neoplasia III) 2000   Complication of anesthesia    did well last 2 times with procedures   Depression    DES exposure in utero    DVT (deep venous thrombosis) (HCC) 01/2009   LEFT LEG   DVT (deep venous thrombosis) (HCC) 10/30/2020   Dyspnea on exertion 10/2017   Elevated triglycerides with high cholesterol    Endometriosis    Fibromyalgia    GERD (gastroesophageal reflux disease)    History of colon polyps    History of hiatal hernia    told by some md has, some say not   IBS (irritable bowel syndrome) 2015   Left bundle branch block    intermittent   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  low TSH 0.22 on 03/24/23.   Vitamin D deficiency      Assessment: Active Problem(s): persistent fatigue. Patient also has been having intermittent episodes of feeling  flushed and hot. Decreased appetite/oral intake. Lack of self care  AC/Heme: Warfarin PTA for h/o VTE - Hgb 16.5, Plts WNL, INR 3.9  Goal of Therapy:  INR 2-3 Monitor platelets by anticoagulation protocol: Yes   Plan:  Hold Coumadin today Dailiy INR   Dolorez Jeffrey S. Merilynn Finland, PharmD, BCPS Clinical Staff Pharmacist [email or web address] Merilynn Finland, Detron Carras Stillinger 04/26/2023,11:16 AM

## 2023-04-26 NOTE — Telephone Encounter (Signed)
Huntley Dec just called at 9:03 to report that Emma Lopez has dialed 911.  Huntley Dec really needs a call back asap.  She is thinking it may time to look into [facility] facility but she needs to talk with you about this.  Please call.

## 2023-04-26 NOTE — ED Provider Notes (Signed)
[place] EMERGENCY DEPARTMENT AT [facility] Provider Note   CSN: [id number] Arrival date & time: 04/26/23  4098     History  Chief Complaint  Patient presents with   Dehydration    Emma Lopez is a 77 y.o. female.  77 year old female presents with possible dehydration.  Patient notes diffuse weakness.  Extensive review of her old records shows that she has been treated for severe depression.  Patient is being started on Zoloft over the last 4 days.  Patient states she does not have any energy.  Patient's cousin, who is her healthcare part returning, notes that patient has had feelings of hopelessness.  She is not been bathing appropriately.  Has had decreased oral intake.  No reported fever, nausea, vomiting.  Some frequent urination noted without flank pain.  Patient states that she feels as if people will be better if she were not around.  No prior history of psychiatric hospitalization       Home Medications Prior to Admission medications   Medication Sig Start Date End Date Taking? Authorizing Provider  ALPRAZolam (XANAX) 0.25 MG tablet Take 1 tablet (0.25 mg total) by mouth every 8 (eight) hours as needed for anxiety. 04/05/17   Harrington Challenger, NP  enoxaparin (LOVENOX) 60 MG/0.6ML injection Inject 0.6 mLs (60 mg total) into the skin every 12 (twelve) hours. 04/06/23   Quintella Reichert, MD  Evolocumab (REPATHA SURECLICK) 140 MG/ML SOAJ Inject 140 mg into the skin every 14 (fourteen) days. 02/17/23   Swinyer, Zachary George, NP  hydrOXYzine (ATARAX) 10 MG tablet TAKE 1 TABLET BY MOUTH THREE TIMES A DAY AS NEEDED 04/02/23   Avelina Laine A, NP  methocarbamol (ROBAXIN) 500 MG tablet Take 250 mg by mouth daily as needed for muscle spasms.    [provider]  nitroGLYCERIN (NITROSTAT) 0.4 MG SL tablet Place under the tongue. 03/03/23   [provider]  ondansetron (ZOFRAN-ODT) 8 MG disintegrating tablet Take 1 tablet (8 mg total) by mouth every 8 (eight) hours as  needed for nausea. 02/14/23   Derwood Kaplan, MD  oxyCODONE (ROXICODONE) 5 MG immediate release tablet Take 1 tablet (5 mg total) by mouth every 8 (eight) hours as needed for up to 6 doses for breakthrough pain. 03/17/23   Curatolo, Adam, DO  rOPINIRole (REQUIP) 1 MG tablet Take 1 tablet (1 mg total) by mouth at bedtime. 11/17/22   Dohmeier, Porfirio Mylar, MD  sertraline (ZOLOFT) 50 MG tablet Take 1/2 tablet by mouth for 7 days, then take one whole tablet daily 04/13/23   Avelina Laine A, NP  sucralfate (CARAFATE) 1 g tablet Take 1 tablet (1 g total) by mouth 4 (four) times daily -  with meals and at bedtime. 02/14/23   Derwood Kaplan, MD  Vilazodone HCl 20 MG TABS Take one tablet by mouth daily 03/08/23   Joan Flores, NP  warfarin (COUMADIN) 1 MG tablet Take 1/2 tablet to 1 tablet by mouth daily or as directed by Anticoagulation Clinic. 06/02/22   Quintella Reichert, MD      Allergies    Sulfa antibiotics    Review of Systems   Review of Systems  All other systems reviewed and are negative.   Physical Exam Updated Vital Signs BP (!) 148/102 (BP Location: Right Arm)   Pulse 65   Temp 97.9 F (36.6 C)   Resp 16   Ht 1.575 m (5\' 2" )   Wt 51.3 kg   SpO2 100%  BMI 20.69 kg/m  Physical Exam Vitals and nursing note reviewed.  Constitutional:      General: She is not in acute distress.    Appearance: Normal appearance. She is well-developed. She is not toxic-appearing.  HENT:     Head: Normocephalic and atraumatic.  Eyes:     General: Lids are normal.     Conjunctiva/sclera: Conjunctivae normal.     Pupils: Pupils are equal, round, and reactive to light.  Neck:     Thyroid: No thyroid mass.     Trachea: No tracheal deviation.  Cardiovascular:     Rate and Rhythm: Normal rate and regular rhythm.     Heart sounds: Normal heart sounds. No murmur heard.    No gallop.  Pulmonary:     Effort: Pulmonary effort is normal. No respiratory distress.     Breath sounds: Normal breath sounds. No  stridor. No decreased breath sounds, wheezing, rhonchi or rales.  Abdominal:     General: There is no distension.     Palpations: Abdomen is soft.     Tenderness: There is no abdominal tenderness. There is no rebound.  Musculoskeletal:        General: No tenderness. Normal range of motion.     Cervical back: Normal range of motion and neck supple.  Skin:    General: Skin is warm and dry.     Findings: No abrasion or rash.  Neurological:     Mental Status: She is alert and oriented to person, place, and time. Mental status is at baseline.     GCS: GCS eye subscore is 4. GCS verbal subscore is 5. GCS motor subscore is 6.     Cranial Nerves: Cranial nerves are intact. No cranial nerve deficit.     Sensory: No sensory deficit.     Motor: Motor function is intact.  Psychiatric:        Attention and Perception: Attention normal.        Mood and Affect: Mood is depressed. Affect is blunt and flat.        Speech: Speech is delayed.        Behavior: Behavior is slowed and withdrawn.        Thought Content: Thought content includes suicidal ideation. Thought content does not include suicidal plan.     ED Results / Procedures / Treatments   Labs (all labs ordered are listed, but only abnormal results are displayed) Labs Reviewed  CBC WITH DIFFERENTIAL/PLATELET  URINALYSIS, ROUTINE W REFLEX MICROSCOPIC  COMPREHENSIVE METABOLIC PANEL  PROTIME-INR  ETHANOL  RAPID URINE DRUG SCREEN, HOSP PERFORMED    EKG None  Radiology No results found.  Procedures Procedures    Medications Ordered in ED Medications  lactated ringers bolus 1,000 mL (has no administration in time range)  lactated ringers infusion (has no administration in time range)    ED Course/ Medical Decision Making/ A&P                                 Medical Decision Making Amount and/or Complexity of Data Reviewed Labs: ordered. ECG/medicine tests: ordered.  Risk Prescription drug management.   Patient  given IV hydration here.  Suspect mild dehydration.  Patient is overall she seems to be severe depression.  Patient noted to have elevated INR of 3.9.  Will hold Coumadin for 2 days and will restart.  Urinalysis is negative for infection here.  Her CBC and  electrolytes otherwise without significant abnormality patient is medically clear at this time.  Consult to psychiatry has been obtained.        Final Clinical Impression(s) / ED Diagnoses Final diagnoses:  None    Rx / DC Orders ED Discharge Orders     None         Lorre Nick, MD 04/26/23 [phone]

## 2023-04-26 NOTE — ED Triage Notes (Signed)
Patient brought in by EMS due to possible dehydration. Pt reports weakness and fatigue. States she has been having to make herself get out of bed to eat and drinking. Reports "dry sweating", which described as feeling on fire. Pt reports frequent urination. States she was here for the same last week. Per EMS patient is transitioning from one SSRI to another.

## 2023-04-27 DIAGNOSIS — F32A Depression, unspecified: Secondary | ICD-10-CM | POA: Diagnosis not present

## 2023-04-27 DIAGNOSIS — F332 Major depressive disorder, recurrent severe without psychotic features: Secondary | ICD-10-CM

## 2023-04-27 DIAGNOSIS — R531 Weakness: Secondary | ICD-10-CM | POA: Diagnosis not present

## 2023-04-27 DIAGNOSIS — E860 Dehydration: Secondary | ICD-10-CM | POA: Diagnosis not present

## 2023-04-27 DIAGNOSIS — R55 Syncope and collapse: Secondary | ICD-10-CM | POA: Diagnosis not present

## 2023-04-27 LAB — PROTIME-INR
INR: 4.6 (ref 0.8–1.2)
Prothrombin Time: 43.7 seconds — ABNORMAL HIGH (ref 11.4–15.2)

## 2023-04-27 MED ORDER — CARMEX CLASSIC LIP BALM EX OINT
TOPICAL_OINTMENT | CUTANEOUS | Status: DC | PRN
  Filled 2023-04-27: qty 10

## 2023-04-27 MED ORDER — WARFARIN - PHARMACIST DOSING INPATIENT: Freq: Every day | Status: DC

## 2023-04-27 NOTE — ED Notes (Signed)
Calm, cooperative today - although appears anxious at times. Visitor today - POA.

## 2023-04-27 NOTE — ED Provider Notes (Signed)
Emergency Medicine Observation Re-evaluation Note  Emma Lopez is a 77 y.o. female, seen on rounds today.  Pt initially presented to the ED for complaints of Dehydration Currently, the patient is holding in the ED.  Physical Exam  BP 133/65 (BP Location: Left Arm)   Pulse 75   Temp 98.8 F (37.1 C) (Oral)   Resp 16   Ht 1.575 m (5\' 2" )   Wt 51.3 kg   SpO2 98%   BMI 20.69 kg/m  Physical Exam General: nad Cardiac: regular rate Lungs: normal effort Psych: sleeping this am  ED Course / MDM  EKG:   I have reviewed the labs performed to date as well as medications administered while in observation.  Recent changes in the last 24 hours include still with elevated inr.  Will hold coumadin.  Plan  Current plan is for inpatient psych. Coumadin level elevated this am. Pharmacy consulted to assist with dosing.  Will hold next dose for now   Emma Dibbles, MD 04/27/23 [phone]

## 2023-04-27 NOTE — Progress Notes (Signed)
ANTICOAGULATION CONSULT NOTE  Pharmacy Consult for Coumadin Indication:  h/o VTE  Allergies  Allergen Reactions   Hydroxyzine Other (See Comments)    Vertigo    Sulfa Antibiotics Rash    Patient Measurements: Height: 5\' 2"  (157.5 cm) Weight: 51.3 kg (113 lb 1.5 oz) IBW/kg (Calculated) : 50.1  Vital Signs: Temp: 98.8 F (37.1 C) (09/26 0635) Temp Source: Oral (09/26 0635) BP: 133/65 (09/26 0635) Pulse Rate: 75 (09/26 0635)  Labs: Recent Labs    04/26/23 1006 04/27/23 0642  HGB 16.5*  --   HCT 49.9*  --   PLT 270  --   LABPROT 38.1* 43.7*  INR 3.9* 4.6*  CREATININE 0.56  --     Estimated Creatinine Clearance: 48.1 mL/min (by C-G formula based on SCr of 0.56 mg/dL).   Medical History: Past Medical History:  Diagnosis Date   Abnormal EKG    Anxiety    Arthritis    oa   Bradycardia 12/14/2017   CIN III (cervical intraepithelial neoplasia III) 2000   Complication of anesthesia    did well last 2 times with procedures   Depression    DES exposure in utero    DVT (deep venous thrombosis) (HCC) 01/2009   LEFT LEG   DVT (deep venous thrombosis) (HCC) 10/30/2020   Dyspnea on exertion 10/2017   Elevated triglycerides with high cholesterol    Endometriosis    Fibromyalgia    GERD (gastroesophageal reflux disease)    History of colon polyps    History of hiatal hernia    told by some md has, some say not   IBS (irritable bowel syndrome) 2015   Left bundle branch block    intermittent   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  low TSH 0.22 on 03/24/23.   Vitamin D deficiency      Assessment: Active Problem(s):  persistent fatigue. Patient also has been having intermittent episodes of feeling flushed and hot. Decreased appetite/oral intake. Lack of self care  AC/Heme: Warfarin PTA for h/o VTE - home regimen 1mg  daily except 0.5mg  SunTuTh  INR continues to be SUPRAtherapeutic No reported bleeding  Goal of Therapy:  INR 2-3 Monitor platelets by anticoagulation protocol: Yes   Plan:  Hold Coumadin again today Dailiy INR    Hessie Knows, PharmD, BCPS Secure Chat if ?s 04/27/2023 10:06 AM

## 2023-04-27 NOTE — Telephone Encounter (Signed)
Patient is still in ED

## 2023-04-27 NOTE — Progress Notes (Signed)
[facility] Psych ED Progress Note  04/27/2023 6:38 PM FREEDOM SALISBURY  MRN:  [id number]    Principal Problem: Major depressive disorder with current active episode Diagnosis:  Principal Problem:   Major depressive disorder with current active episode   ED Assessment Time Calculation: Start Time: 1015 Stop Time: 1035 Total Time in Minutes (Assessment Completion): 20   Subjective: Emma Lopez, 77 y.o., female patient seen face to face by this provider, consulted with Dr. Lucianne Muss; and chart reviewed on 04/27/23.  On evaluation Emma Lopez patient sitting up in her bed, no acute distress. She is alert and oriented x4 to person, place, time, and situation. Patient mood is euthymic, affect congruent with mood.  Thought process coherent and linear.  Thought content logical and within normal limits.  Memory, judgment, and insight fair. Patient refused to contract for safety.  Patient states that she suffers from major depression and anxiety, began discussing with provider that over the summer she had been having panic attacks that correlated with chest pain, and she had been calling 911 and had been receiving several cardiac workups.  Says that she sees Avelina Laine at [facility], who prescribes her medications, she has been with him since July 2024.  She states that she has taken Cymbalta, Lexapro, now she is on Zoloft and has just recently stopped taking Viibryd.  She states she is currently having hot flashes and sweats and not sure where it is coming from, says her OB/GYN thought it was the Requip for restless legs, but now she feels that it could be from the psychiatric medications.  She states that she has had increased feelings of fatigue, tiredness and low energy she does have a history of fibromyalgia.  She states her sleep is fair and appetite is poor, states she does not eat a lot because she does not want to increase her cholesterol or be diagnosed with diabetes, states she eats breakfast and will have  some tells, yogurt, maybe coffee, and cheese, snacks for lunch and dinner. She states when she does become anxious she uses coping skills such as going into a paper bag, she is prescribed Xanax, takes it for antianxiety, listens to music and goes for a walk if he needs to. Patient's cousin Hinton Rao is in patient's room with her, patient allows provider to speak in front of her cousin, and allows cousins to speak with provider and Ms. Maralyn Sago states that the family is trying to get patient into a friend's home in the next 2 weeks, feels a lot of her anxiety and depression is due to loneliness.  Patient's cousin Hinton Rao called this provider and stated that patient has a serious lack of discipline in her eating, never been married, Elaria been retired for a long time, didn't have too work, very Multimedia programmer, and comes from a wealthy family.  Expediting Friends Home, request so patient would not be alone, she feels that patient has a vicious cycle of not wanting to eat, she feels that is not because patient wants to be healthy and lower cholesterol, she feels that it is because patient is depressed, does not have the energy to cook and does not feel that she needs to eat.  She states that she looked in patient's refrigerator and patient just has a lot of snack foods such as cheese, yogurt, protein shakes, and ice cream.  Patient states that food does not make her feel any better.  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  Maralyn Sago states that patient used to be a world traveler, now she appears to enjoy going to doctor's appointments.  She states that patient works herself into a frenzy and makes herself have a panic attack. Thinks that loneliness has caused this, had a boyfriend and he passed  away last year; was close to his 2 daughters but they moved away after their father died.   Maralyn Sago states that she would like to be updated on anything that has to do with patient, states she is the head power of attorney, and that her cousin Vernona Rieger assist in small matters.   Patient has not been compliant with medication Zoloft and Wellbutrin, while here in the hospital, states she did not understand that she was supposed to take both antidepressants at the same time, thought she was being tapered off of the Zoloft and started on the Wellbutrin.  This provider informed her that she can take both to help with her depression.  Past Psychiatric History: depression and anxiety    [place] Scale:  Flowsheet Row ED from 04/26/2023 in [facility] Emergency Department at [facility] ED from 04/20/2023 in [facility] Emergency Department at [facility] ED from 03[facility]17/2023 in [facility] Emergency Department at [facility]  C-SSRS RISK CATEGORY No Risk No Risk No Risk       Past Medical History:  Past Medical History:  Diagnosis Date   Abnormal EKG    Anxiety    Arthritis    oa   Bradycardia 12/14/2017   CIN III (cervical intraepithelial neoplasia III) 2000   Complication of anesthesia    did well last 2 times with procedures   Depression    DES exposure in utero    DVT (deep venous thrombosis) (HCC) 01/2009   LEFT LEG   DVT (deep venous thrombosis) (HCC) 10/30/2020   Dyspnea on exertion 10/2017   Elevated triglycerides with high cholesterol    Endometriosis    Fibromyalgia    GERD (gastroesophageal reflux disease)    History of colon polyps    History of hiatal hernia    told by some md has, some say not   IBS (irritable bowel syndrome) 2015   Left bundle branch block    intermittent   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   Lupus anticoagulant disorder (HCC)    Multinodular goiter    Osteoporosis 12/2017   T score -3.2 stable from  prior study   PE (pulmonary embolism)    Pneumonia 03/01/2021   PONV (postoperative nausea and vomiting)    Restless legs syndrome    Sinus bradycardia    Sleep disorder    Thyroid disease    HYPERTHYROIDISM.  low TSH 0.22 on 03/24/23.   Vitamin D deficiency     Past Surgical History:  Procedure Laterality Date   ABDOMINAL HYSTERECTOMY  2001   TAH, partial   APPENDECTOMY  1978   COLONOSCOPY WITH PROPOFOL N/A 12/07/2015   Procedure: COLONOSCOPY WITH PROPOFOL;  Surgeon: Charolett Bumpers, MD;  Location: [facility];  Service: Endoscopy;  Laterality: N/A;   COLONOSCOPY WITH PROPOFOL N/A 01/11/2022   Procedure: COLONOSCOPY WITH PROPOFOL;  Surgeon: Kerin Salen, MD;  Location: [facility];  Service: Gastroenterology;  Laterality:  N/A;   colonscopy  7 yrs ago   other in past   ESOPHAGOGASTRODUODENOSCOPY (EGD) WITH PROPOFOL N/A 12/07/2015   Procedure: ESOPHAGOGASTRODUODENOSCOPY (EGD) WITH PROPOFOL;  Surgeon: Charolett Bumpers, MD;  [facility]: [facility];  Service: Endoscopy;  Laterality: N/A;   ESOPHAGOGASTRODUODENOSCOPY ENDOSCOPY  yrs ago   FACIAL COSMETIC SURGERY     GYNECOLOGIC CRYOSURGERY     HEMOSTASIS CLIP PLACEMENT  01/11/2022   Procedure: HEMOSTASIS CLIP PLACEMENT;  Surgeon: Kerin Salen, MD;  [facility]: [facility];  Service: Gastroenterology;;   HOT HEMOSTASIS N/A 01/11/2022   Procedure: HOT HEMOSTASIS (ARGON PLASMA COAGULATION/BICAP);  Surgeon: Kerin Salen, MD;  [facility];  Service: Gastroenterology;  Laterality: N/A;   MYOMECTOMY     POLYPECTOMY  01/11/2022   Procedure: POLYPECTOMY;  Surgeon: Kerin Salen, MD;  [facility]: [facility];  Service: Gastroenterology;;   REPLACEMENT TOTAL JOINT WRIST W/ PROSTHETIC IMPLANT Right 08/01/2021   TONSILLECTOMY     Family History:  Family History  Problem Relation Age of Onset   Depression Mother    Anxiety disorder Mother    Hypertension Mother    Breast cancer Mother        19's   Cancer Father        COLON   Depression  Sister    Anxiety disorder Sister    Hypertension Sister    Stroke Sister    Breast cancer Maternal Aunt        Age 51's   Cancer Maternal Aunt        Melanoma   Alzheimer's disease Maternal Aunt    Breast cancer Maternal Aunt        70's   Cancer Maternal Aunt        Colon CA   Alzheimer's disease Maternal Aunt    Cancer Paternal Aunt        OVARIAN and COLON    Social History:  Social History   Substance and Sexual Activity  Alcohol Use Yes   Comment: 1 drink per month     Social History   Substance and Sexual Activity  Drug Use No    Social History   Socioeconomic History   Marital status: Single    Spouse name: Not on file   Number of children: 0   Years of education: 16   Highest education level: Bachelor's degree (e.g., BA, AB, BS)  Occupational History   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  Tobacco Use   Smoking status: Former    Current packs/day: 0.00    Average packs/day: 0.1 packs/day for 6.0 years (0.6 ttl pk-yrs)    Types: Cigarettes    Start date: 08/01/1972    Quit date: 08/01/1978    Years since quitting: 44.7   Smokeless tobacco: Never   Tobacco comments:    only smoked 4-6 yrs 1 pack per month  Vaping Use   Vaping status: Never Used  Substance and Sexual Activity   Alcohol use: Yes    Comment: 1 drink per month   Drug use: No   Sexual activity: Not Currently    Birth control/protection: Surgical    Comment: HYSTERECTOMY-1st intercourse 27-Fewer than 5 partners  Other Topics Concern   Not on file  Social History Narrative   Lives alone in [place].    Social Determinants of Health   Financial Resource Strain: Not on file  Food Insecurity: Not on file  Transportation Needs: Not on file  Physical Activity: Not on file  Stress: Not on file  Social Connections: Not on file    Sleep: Fair  Appetite:  Poor  Current Medications: Current Facility-Administered Medications  Medication Dose Route Frequency Provider Last  Rate Last Admin   ALPRAZolam Prudy Feeler) tablet 0.25 mg  0.25 mg Oral Q8H PRN Lorre Nick, MD   0.25 mg at 04/26/23 2327   buPROPion ER (WELLBUTRIN SR) 12 hr tablet 100 mg  100 mg Oral Daily Weber, Kyra A, NP   100 mg at 04/26/23 1645   denosumab (PROLIA) injection 60 mg  60 mg Subcutaneous Once Patton Salles, MD       hydrOXYzine (ATARAX) tablet 10 mg  10 mg Oral TID PRN Lorre Nick, MD   10 mg at 04/26/23 2128   methocarbamol (ROBAXIN) tablet 250 mg  250 mg Oral Daily PRN Lorre Nick, MD       ondansetron (ZOFRAN-ODT) disintegrating tablet 8 mg  8 mg Oral Q8H PRN Lorre Nick, MD       oxyCODONE (Oxy IR/ROXICODONE) immediate release tablet 5 mg  5 mg Oral Q8H PRN Lorre Nick, MD       rOPINIRole (REQUIP) tablet 1 mg  1 mg Oral QHS Lorre Nick, MD   1 mg at 04/26/23 1145   sertraline (ZOLOFT) tablet 50 mg  50 mg Oral Daily Lorre Nick, MD   50 mg at 04/26/23 1145   sucralfate (CARAFATE) tablet 1 g  1 g Oral TID WC & HS Lorre Nick, MD   1 g at 04/27/23 1610   Warfarin - Pharmacist Dosing Inpatient   Does not apply q1600 Hessie Knows, [facility]       Current Outpatient Medications  Medication Sig Dispense Refill   ALPRAZolam (XANAX) 0.5 MG tablet Take 0.5 mg by mouth 2 (two) times daily as needed for anxiety.     Evolocumab (REPATHA SURECLICK) 140 MG/ML SOAJ Inject 140 mg into the skin every 14 (fourteen) days. 2 mL 11   methocarbamol (ROBAXIN) 500 MG tablet Take 250 mg by mouth daily as needed for muscle spasms.     oxyCODONE (ROXICODONE) 5 MG immediate release tablet Take 1 tablet (5 mg total) by mouth every 8 (eight) hours as needed for up to 6 doses for breakthrough pain. 6 tablet 0   rOPINIRole (REQUIP) 1 MG tablet Take 1 tablet (1 mg total) by mouth at bedtime. 90 tablet 2   sertraline (ZOLOFT) 50 MG tablet Take 1/2 tablet by mouth for 7 days, then take one whole tablet daily (Patient taking differently: Take 50 mg by mouth See admin instructions.) 30 tablet 1    Vilazodone HCl 20 MG TABS Take one tablet by mouth daily (Patient taking differently: 10 mg by Other route See admin instructions. Take 10 mg by mouth once a day through 04/29/2023) 30 tablet 1   warfarin (COUMADIN) 1 MG tablet Take 1/2 tablet to 1 tablet by mouth daily or as directed by Anticoagulation Clinic. (Patient taking differently: Take 0.5-1 mg by mouth See admin instructions. Take 0.5 mg by mouth in the morning on Sun/Tues/Thurs and 1 mg on Mon/Wed/Fri/Sat) 90 tablet 1   ALPRAZolam (XANAX) 0.25 MG tablet Take 1 tablet (0.25 mg total) by mouth every 8 (eight) hours as needed for anxiety. (Patient not taking: Reported on 04/26/2023) 30 tablet 0   enoxaparin (LOVENOX) 60 MG/0.6ML injection Inject 0.6 mLs (60 mg total) into the skin every 12 (twelve) hours. (Patient not taking: Reported on 04/26/2023) 12 mL 1   hydrOXYzine (ATARAX) 10 MG tablet TAKE  1 TABLET BY MOUTH THREE TIMES A DAY AS NEEDED (Patient not taking: Reported on 04/26/2023) 270 tablet 0   ondansetron (ZOFRAN-ODT) 8 MG disintegrating tablet Take 1 tablet (8 mg total) by mouth every 8 (eight) hours as needed for nausea. (Patient not taking: Reported on 04/26/2023) 20 tablet 0   sucralfate (CARAFATE) 1 g tablet Take 1 tablet (1 g total) by mouth 4 (four) times daily -  with meals and at bedtime. (Patient not taking: Reported on 04/26/2023) 60 tablet 0    Lab Results:  Results for orders placed or performed during the hospital encounter of 04/26/23 (from the past 48 hour(s))  Urinalysis, Routine w reflex microscopic -Urine, Clean Catch     Status: Abnormal   Collection Time: 04/26/23  9:48 AM  Result Value Ref Range   Color, Urine STRAW (A) YELLOW   APPearance CLEAR CLEAR   Specific Gravity, Urine 1.011 1.005 - 1.030   pH 6.0 5.0 - 8.0   Glucose, UA NEGATIVE NEGATIVE mg/dL   Hgb urine dipstick NEGATIVE NEGATIVE   Bilirubin Urine NEGATIVE NEGATIVE   Ketones, ur NEGATIVE NEGATIVE mg/dL   Protein, ur NEGATIVE NEGATIVE mg/dL   Nitrite  NEGATIVE NEGATIVE   Leukocytes,Ua NEGATIVE NEGATIVE    Comment: Performed at [facility], [address] [place], [place] [address]  Rapid urine drug screen (hospital performed)     Status: None   Collection Time: 04/26/23  9:48 AM  Result Value Ref Range   Opiates NONE DETECTED NONE DETECTED   Cocaine NONE DETECTED NONE DETECTED   Benzodiazepines NONE DETECTED NONE DETECTED   Amphetamines NONE DETECTED NONE DETECTED   Tetrahydrocannabinol NONE DETECTED NONE DETECTED   Barbiturates NONE DETECTED NONE DETECTED    Comment: (NOTE) DRUG SCREEN FOR MEDICAL PURPOSES ONLY.  IF CONFIRMATION IS NEEDED FOR ANY PURPOSE, NOTIFY LAB WITHIN 5 DAYS.  LOWEST DETECTABLE LIMITS FOR URINE DRUG SCREEN Drug Class                     Cutoff (ng/mL) Amphetamine and metabolites    1000 Barbiturate and metabolites    200 Benzodiazepine                 200 Opiates and metabolites        300 Cocaine and metabolites        300 THC                            50 Performed at [facility], [address], [place], [place] [address]   CBC with Differential/Platelet     Status: Abnormal   Collection Time: 04/26/23 10:06 AM  Result Value Ref Range   WBC 9.7 4.0 - 10.5 K/uL   RBC 5.25 (H) 3.87 - 5.11 MIL/uL   Hemoglobin 16.5 (H) 12.0 - 15.0 g/dL   HCT 47.4 (H) 25.9 - 56.3 %   MCV 95.0 80.0 - 100.0 fL   MCH 31.4 26.0 - 34.0 pg   MCHC 33.1 30.0 - 36.0 g/dL   RDW 87.5 64.3 - 32.9 %   Platelets 270 150 - 400 K/uL   nRBC 0.0 0.0 - 0.2 %   Neutrophils Relative % 72 %   Neutro Abs 7.1 1.7 - 7.7 K/uL   Lymphocytes Relative 15 %   Lymphs Abs 1.5 0.7 - 4.0 K/uL   Monocytes Relative 10 %   Monocytes Absolute 0.9 0.1 - 1.0 K/uL  Eosinophils Relative 1 %   Eosinophils Absolute 0.1 0.0 - 0.5 K/uL   Basophils Relative 1 %   Basophils Absolute 0.1 0.0 - 0.1 K/uL   Immature Granulocytes 1 %   Abs Immature Granulocytes 0.06 0.00 - 0.07 K/uL    Comment: Performed at [facility], [address], [place], [place] [address]  Comprehensive metabolic panel     Status: Abnormal   Collection Time: 04/26/23 10:06 AM  Result Value Ref Range   Sodium 136 135 - 145 mmol/L   Potassium 3.8 3.5 - 5.1 mmol/L   Chloride 100 98 - 111 mmol/L   CO2 25 22 - 32 mmol/L   Glucose, Bld 106 (H) 70 - 99 mg/dL    Comment: Glucose reference range applies only to samples taken after fasting for at least 8 hours.   BUN 22 8 - 23 mg/dL   Creatinine, Ser 6.21 0.44 - 1.00 mg/dL   Calcium 9.1 8.9 - 30.8 mg/dL   Total Protein 7.4 6.5 - 8.1 g/dL   Albumin 3.7 3.5 - 5.0 g/dL   AST 26 15 - 41 U/L   ALT 26 0 - 44 U/L   Alkaline Phosphatase 68 38 - 126 U/L   Total Bilirubin 0.6 0.3 - 1.2 mg/dL   GFR, Estimated >65 >78 mL/min    Comment: (NOTE) Calculated using the CKD-EPI Creatinine Equation (2021)    Anion gap 11 5 - 15    Comment: Performed at [facility], [address] [place], [place] [address]  Protime-INR     Status: Abnormal   Collection Time: 04/26/23 10:06 AM  Result Value Ref Range   Prothrombin Time 38.1 (H) 11.4 - 15.2 seconds   INR 3.9 (H) 0.8 - 1.2    Comment: (NOTE) INR goal varies based on device and disease states. Performed at [facility], [address] [place], [place] [address]   Ethanol     Status: None   Collection Time: 04/26/23 10:06 AM  Result Value Ref Range   Alcohol, Ethyl (B) <10 <10 mg/dL    Comment: (NOTE) Lowest detectable limit for serum alcohol is 10 mg/dL.  For medical purposes only. Performed at [facility], [address] [place], [place] [address]   Protime-INR     Status: Abnormal   Collection Time: 04/27/23  6:42 AM  Result Value Ref Range   Prothrombin Time 43.7 (H) 11.4 - 15.2 seconds   INR 4.6 (HH) 0.8 - 1.2    Comment: CRITICAL RESULT CALLED TO, READ BACK BY AND VERIFIED WITH: MILNER,T. RN @0819  ON 9.26.2024 BY NMCCOY (NOTE) INR goal varies  based on device and disease states. Performed at [facility], [address] [place] [address] [place], [place] [address]     Blood Alcohol level:  Lab Results  Component Value Date   ETH <10 04/26/2023    Physical Findings:  CIWA:    COWS:     Musculoskeletal: Strength & Muscle Tone: within normal limits Gait & Station: normal Patient leans: N/A  Psychiatric Specialty Exam:  Presentation  General Appearance:  Appropriate for Environment  Eye Contact: Good  Speech: Clear and Coherent  Speech Volume: Normal  Handedness: Right   Mood and Affect  Mood: Depressed; Anxious; Hopeless  Affect: Congruent   Thought Process  Thought Processes: Coherent  Descriptions of Associations:Intact  Orientation:Full (Time, Place and Person)  Thought Content:WDL  History of Schizophrenia/Schizoaffective disorder:No data recorded Duration of Psychotic Symptoms:No  data recorded Hallucinations:Hallucinations: None  Ideas of Reference:None  Suicidal Thoughts:Suicidal Thoughts: Yes, Passive SI Passive Intent and/or Plan: Without Intent; Without Plan  Homicidal Thoughts:Homicidal Thoughts: No   Sensorium  Memory: Immediate Fair; Recent Fair  Judgment: Intact  Insight: Fair   Chartered certified accountant: Fair  Attention Span: Fair  Recall: Good  Fund of Knowledge: Good  Language: Good   Psychomotor Activity  Psychomotor Activity: Psychomotor Activity: Normal   Assets  Assets: Communication Skills; Desire for Improvement; Social Support; Housing; Transportation   Sleep  Sleep: Sleep: Fair Number of Hours of Sleep: 6    Physical Exam: Physical Exam Vitals and nursing note reviewed. Exam conducted with a chaperone present.  Neurological:     Mental Status: She is alert.  Psychiatric:        Attention and Perception: Attention normal.        Mood and Affect: Mood is anxious and depressed. Affect is flat.         Speech: Speech normal.        Behavior: Behavior is cooperative.        Thought Content: Thought content includes suicidal ideation. Thought content includes suicidal plan.        Cognition and Memory: Memory normal.        Judgment: Judgment is inappropriate.    Review of Systems  Psychiatric/Behavioral:  Positive for depression and suicidal ideas.    Blood pressure (!) 133/96, pulse 69, temperature 97.7 F (36.5 C), temperature source Oral, resp. rate 18, height 5\' 2"  (1.575 m), weight 51.3 kg, SpO2 99%. Body mass index is 20.69 kg/m.   Medical Decision Making: Continue to recommend inpatient psychiatric hospitalization for stabilization and treatment.   Maliq Pilley MOTLEY-MANGRUM, PMHNP 04/27/2023, 6:38 PM

## 2023-04-27 NOTE — Telephone Encounter (Signed)
Pt's INR and PT is a critical high. Pt is on Coumadin. Still in the ED

## 2023-04-28 LAB — PROTIME-INR
INR: 3.6 — ABNORMAL HIGH (ref 0.8–1.2)
Prothrombin Time: 35.8 s — ABNORMAL HIGH (ref 11.4–15.2)

## 2023-04-28 MED ORDER — ENSURE ENLIVE PO LIQD
237.0000 mL | Freq: Two times a day (BID) | ORAL | Status: DC
  Administered 2023-04-28 – 2023-04-29 (×3): 237 mL via ORAL
  Filled 2023-04-28 (×5): qty 237

## 2023-04-28 NOTE — Progress Notes (Signed)
LCSW Progress Note  [id number]   MARYANN MCCALL  04/28/2023  1:24 AM    Inpatient Behavioral Health Placement  Pt meets inpatient criteria per Alona Bene, PMHNP. There are no available beds within [facility] system per [facility] AC Oluwatosin Olasunkanmi,RN. Referral was sent to the following facilities;  Destination  Service Provider Address Phone [facility]  [address] [place] [address] [phone]  [facility]  [address], [place] [address] [phone]  [facility]  [address] [place] [address] [phone]  [facility]  [address] [place] [address] [phone]  [facility]  [address] [place] [address] [phone]  [facility]  [address], [place] [address] [phone]  [facility]  [place] [phone]  [facility]  [address] [place] [address] [phone]  [facility]  [address], [place] [address] [phone]  [facility] Inpatient [facility]  [place] [phone]  [facility]  [address] [place] [address] [phone]  [facility]  [address] [place], [place] [address] [phone]  [facility]  [address] [place] [address] [phone]  [facility]  [address], [place] [address] [phone]  [facility] Patient Placement  [facility], [place] [phone]  [facility]  [address], [place] [address] [phone]  [facility]  [address] [place] [address] [phone]  [facility]  [address] [place], [place] [phone]  [facility]  [address], [place] [address] [phone]  [facility]  [address], [place] [address] [phone]  [facility]  [address] [place] [address] [phone]  [facility]  [address] [place] [address] [phone]  [facility]  [address] [place] [address] [phone]  [facility] [address]  [place] [address] [phone]  [facility]  [address] [place], [place] [address] [phone]  [facility]  [address] [place] [address] [phone]  [facility]  [address] [place] [address] [phone]    Situation ongoing,  CSW will follow up.    Maryjean Ka, MSW, LCSWA 04/28/2023 1:24 AM

## 2023-04-28 NOTE — Telephone Encounter (Signed)
Patient still hospitalized

## 2023-04-28 NOTE — Progress Notes (Signed)
Pt was accepted to [facility] TODAY 04/28/2023;Bed Assignment  PENDING Signed Voluntary consent uploaded to chart or faxed to [facility] FAX Number [phone].  Address: [address] [place], [place], [place] [address]  Pt meets inpatient criteria per Alona Bene, PMHNP   Attending Physician will be Dr. Shellee Milo  Report can be called to: [phone]  Pt can arrive after: [facility] and [facility] RN to coordinate with care team.  Care Team notified: [facility], PMHNP,Evening [facility] Lona Kettle, Night [facility] Herold Harms, Kudakwashe Whitaker,RN, Denisse Garnetta [place], [place], Beth Willis,RN   Maryjean Ka, MSW, [facility] 04/28/2023 10:34 PM

## 2023-04-28 NOTE — ED Notes (Signed)
Patient states " not doing well",  "hot flashes continue".

## 2023-04-28 NOTE — ED Notes (Signed)
Patient alert and oriented this shift. Patient eating.  Patient endorsed worsening depression. No noted suicidal ideation. No paranoia or delusions noted.

## 2023-04-28 NOTE — ED Notes (Signed)
Pt refused to sign voluntary admission and consent for treatment. Pt stated that she feels better at home however needs assistance. Pt also stated that in about 2 weeks will be admitted to a living facility. Patient stated that she dizziness and weakness and would like to discuss meds with provider.

## 2023-04-28 NOTE — Progress Notes (Signed)
ANTICOAGULATION CONSULT NOTE  Pharmacy Consult for Warfarin Indication:  h/o VTE  Allergies  Allergen Reactions   Hydroxyzine Other (See Comments)    Vertigo    Sulfa Antibiotics Rash    Patient Measurements: Height: 5\' 2"  (157.5 cm) Weight: 51.3 kg (113 lb 1.5 oz) IBW/kg (Calculated) : 50.1  Vital Signs: Temp: 98.6 F (37 C) (09/27 0601) Temp Source: Oral (09/27 0601) BP: 150/64 (09/27 0601) Pulse Rate: 68 (09/27 0601)  Labs: Recent Labs    04/26/23 1006 04/27/23 0642 04/28/23 0749  HGB 16.5*  --   --   HCT 49.9*  --   --   PLT 270  --   --   LABPROT 38.1* 43.7* 35.8*  INR 3.9* 4.6* 3.6*  CREATININE 0.56  --   --     Estimated Creatinine Clearance: 48.1 mL/min (by C-G formula based on SCr of 0.56 mg/dL).   Medical History: Past Medical History:  Diagnosis Date   Abnormal EKG    Anxiety    Arthritis    oa   Bradycardia 12/14/2017   CIN III (cervical intraepithelial neoplasia III) 2000   Complication of anesthesia    did well last 2 times with procedures   Depression    DES exposure in utero    DVT (deep venous thrombosis) (HCC) 01/2009   LEFT LEG   DVT (deep venous thrombosis) (HCC) 10/30/2020   Dyspnea on exertion 10/2017   Elevated triglycerides with high cholesterol    Endometriosis    Fibromyalgia    GERD (gastroesophageal reflux disease)    History of colon polyps    History of hiatal hernia    told by some md has, some say not   IBS (irritable bowel syndrome) 2015   Left bundle branch block    intermittent   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  low TSH 0.22 on 03/24/23.   Vitamin D  deficiency      Assessment: Emma Lopez presented to ED on 9/25 with persistent fatigue, intermittent episodes of feeling flushed and hot, decreased appetite/oral intake.  Psychiatry is planning transfer to inpatient psychiatric hospitalization.  Pharmacy is consulted to continue warfarin dosing for history of VTE.  Prior to admit warfarin: 1mg  daily except 0.5mg  SunTuTh.  Last dose 9/25. Admit INR elevated at 3.9   Today, 04/28/2023: INR 3.6, continues to be SUPRAtherapeutic, but decreased from 4.6 yesterday. CBC: Hgb elevated, Plt WNL No reported bleeding No major drug-drug interactions, regular diet ordered  Goal of Therapy:  INR 2-3 Monitor platelets by anticoagulation protocol: Yes   Plan:  Hold warfarin again today Daily INR Monitor for signs and symptoms of bleeding.    Lynann Beaver PharmD, BCPS [facility] main pharmacy [phone] 04/28/2023 9:47 AM

## 2023-04-28 NOTE — Progress Notes (Addendum)
Per [facility] pt is refusing to signed voluntary consent for inpatient BH placement at [facility].   Per nursing pt has requested to speak with provider about meds. Per [facility] Fransico Michael, RN, Weekend Day [facility] will follow up in the morning.   CSW/Disposition team will assist and follow with inpatient BH placement.    Maryjean Ka, MSW, [facility] 04/28/2023 11:32 PM

## 2023-04-28 NOTE — ED Notes (Addendum)
Emma Lopez  has expressed concern that the hot flashes that she is experiencing is contributing to her depression. She asks that she be treated for the hot flashes so that her depression has a better chance of resolving. Portable fan provided.

## 2023-04-29 ENCOUNTER — Other Ambulatory Visit: Payer: Self-pay

## 2023-04-29 ENCOUNTER — Encounter: Payer: Self-pay | Admitting: Nurse Practitioner

## 2023-04-29 ENCOUNTER — Inpatient Hospital Stay
Admission: AD | Admit: 2023-04-29 | Discharge: 2023-05-08 | DRG: 885 | Disposition: A | Discharge status: Home / Self Care | Payer: PPO | Source: Intra-hospital | Attending: Psychiatry | Admitting: Psychiatry

## 2023-04-29 DIAGNOSIS — E781 Pure hyperglyceridemia: Secondary | ICD-10-CM | POA: Diagnosis present

## 2023-04-29 DIAGNOSIS — Z8249 Family history of ischemic heart disease and other diseases of the circulatory system: Secondary | ICD-10-CM

## 2023-04-29 DIAGNOSIS — K219 Gastro-esophageal reflux disease without esophagitis: Secondary | ICD-10-CM | POA: Diagnosis present

## 2023-04-29 DIAGNOSIS — Z87891 Personal history of nicotine dependence: Secondary | ICD-10-CM | POA: Diagnosis not present

## 2023-04-29 DIAGNOSIS — Z818 Family history of other mental and behavioral disorders: Secondary | ICD-10-CM

## 2023-04-29 DIAGNOSIS — Z86718 Personal history of other venous thrombosis and embolism: Secondary | ICD-10-CM | POA: Diagnosis not present

## 2023-04-29 DIAGNOSIS — F0283 Dementia in other diseases classified elsewhere, unspecified severity, with mood disturbance: Secondary | ICD-10-CM | POA: Diagnosis present

## 2023-04-29 DIAGNOSIS — R627 Adult failure to thrive: Secondary | ICD-10-CM | POA: Diagnosis not present

## 2023-04-29 DIAGNOSIS — Z86711 Personal history of pulmonary embolism: Secondary | ICD-10-CM | POA: Diagnosis not present

## 2023-04-29 DIAGNOSIS — Z823 Family history of stroke: Secondary | ICD-10-CM | POA: Diagnosis not present

## 2023-04-29 DIAGNOSIS — Z886 Allergy status to analgesic agent status: Secondary | ICD-10-CM | POA: Diagnosis not present

## 2023-04-29 DIAGNOSIS — Z882 Allergy status to sulfonamides status: Secondary | ICD-10-CM

## 2023-04-29 DIAGNOSIS — D6862 Lupus anticoagulant syndrome: Secondary | ICD-10-CM | POA: Diagnosis present

## 2023-04-29 DIAGNOSIS — E860 Dehydration: Secondary | ICD-10-CM | POA: Diagnosis not present

## 2023-04-29 DIAGNOSIS — M797 Fibromyalgia: Secondary | ICD-10-CM | POA: Diagnosis present

## 2023-04-29 DIAGNOSIS — R45851 Suicidal ideations: Secondary | ICD-10-CM | POA: Diagnosis not present

## 2023-04-29 DIAGNOSIS — F332 Major depressive disorder, recurrent severe without psychotic features: Principal | ICD-10-CM | POA: Diagnosis present

## 2023-04-29 DIAGNOSIS — Z7901 Long term (current) use of anticoagulants: Secondary | ICD-10-CM | POA: Diagnosis not present

## 2023-04-29 DIAGNOSIS — Z79899 Other long term (current) drug therapy: Secondary | ICD-10-CM | POA: Diagnosis not present

## 2023-04-29 DIAGNOSIS — G309 Alzheimer's disease, unspecified: Secondary | ICD-10-CM | POA: Diagnosis present

## 2023-04-29 DIAGNOSIS — I447 Left bundle-branch block, unspecified: Secondary | ICD-10-CM | POA: Diagnosis not present

## 2023-04-29 DIAGNOSIS — E7800 Pure hypercholesterolemia, unspecified: Secondary | ICD-10-CM | POA: Diagnosis not present

## 2023-04-29 DIAGNOSIS — G2581 Restless legs syndrome: Secondary | ICD-10-CM | POA: Diagnosis present

## 2023-04-29 DIAGNOSIS — F32A Depression, unspecified: Secondary | ICD-10-CM | POA: Diagnosis not present

## 2023-04-29 DIAGNOSIS — R531 Weakness: Secondary | ICD-10-CM | POA: Diagnosis not present

## 2023-04-29 DIAGNOSIS — Z800 Family history of malignant neoplasm of digestive organs: Secondary | ICD-10-CM

## 2023-04-29 DIAGNOSIS — Z820 Family history of epilepsy and other diseases of the nervous system: Secondary | ICD-10-CM

## 2023-04-29 DIAGNOSIS — Z803 Family history of malignant neoplasm of breast: Secondary | ICD-10-CM

## 2023-04-29 DIAGNOSIS — R55 Syncope and collapse: Secondary | ICD-10-CM | POA: Diagnosis not present

## 2023-04-29 LAB — BASIC METABOLIC PANEL
Anion gap: 9 (ref 5–15)
BUN: 21 mg/dL (ref 8–23)
CO2: 27 mmol/L (ref 22–32)
Calcium: 8.5 mg/dL — ABNORMAL LOW (ref 8.9–10.3)
Chloride: 100 mmol/L (ref 98–111)
Creatinine, Ser: 0.59 mg/dL (ref 0.44–1.00)
GFR, Estimated: >60 mL/min (ref >60)
Glucose, Bld: 133 mg/dL — ABNORMAL HIGH (ref 70–99)
Potassium: 3.8 mmol/L (ref 3.5–5.1)
Sodium: 136 mmol/L (ref 135–145)

## 2023-04-29 LAB — CBC WITH DIFFERENTIAL/PLATELET
Abs Immature Granulocytes: 0.05 10*3/uL (ref 0.00–0.07)
Basophils Absolute: 0.1 10*3/uL (ref 0.0–0.1)
Basophils Relative: 1 %
Eosinophils Absolute: 0.1 10*3/uL (ref 0.0–0.5)
Eosinophils Relative: 1 %
HCT: 47.2 % — ABNORMAL HIGH (ref 36.0–46.0)
Hemoglobin: 15.4 g/dL — ABNORMAL HIGH (ref 12.0–15.0)
Immature Granulocytes: 1 %
Lymphocytes Relative: 15 %
Lymphs Abs: 1.6 10*3/uL (ref 0.7–4.0)
MCH: 31.1 pg (ref 26.0–34.0)
MCHC: 32.6 g/dL (ref 30.0–36.0)
MCV: 95.4 fL (ref 80.0–100.0)
Monocytes Absolute: 1.2 10*3/uL — ABNORMAL HIGH (ref 0.1–1.0)
Monocytes Relative: 11 %
Neutro Abs: 7.9 10*3/uL — ABNORMAL HIGH (ref 1.7–7.7)
Neutrophils Relative %: 71 %
Platelets: 287 10*3/uL (ref 150–400)
RBC: 4.95 MIL/uL (ref 3.87–5.11)
RDW: 13.7 % (ref 11.5–15.5)
WBC: 10.9 10*3/uL — ABNORMAL HIGH (ref 4.0–10.5)
nRBC: 0 % (ref 0.0–0.2)

## 2023-04-29 LAB — PROTIME-INR
INR: 3.1 — ABNORMAL HIGH (ref 0.8–1.2)
INR: 2.6 — ABNORMAL HIGH (ref 0.8–1.2)
Prothrombin Time: 31.9 s — ABNORMAL HIGH (ref 11.4–15.2)
Prothrombin Time: 28.5 s — ABNORMAL HIGH (ref 11.4–15.2)

## 2023-04-29 MED ORDER — WARFARIN - PHARMACIST DOSING INPATIENT: Freq: Every day | Status: DC

## 2023-04-29 MED ORDER — ALUM & MAG HYDROXIDE-SIMETH 200-200-20 MG/5ML PO SUSP: 30.0000 mL | ORAL | Status: DC | PRN

## 2023-04-29 MED ORDER — SERTRALINE HCL 50 MG PO TABS
50.0000 mg | ORAL_TABLET | Freq: Every day | ORAL | Status: DC
  Administered 2023-04-30: 50 mg via ORAL
  Filled 2023-04-29: qty 1

## 2023-04-29 MED ORDER — ACETAMINOPHEN 325 MG PO TABS
650.0000 mg | ORAL_TABLET | Freq: Four times a day (QID) | ORAL | Status: DC | PRN
  Administered 2023-04-30: 650 mg via ORAL
  Filled 2023-04-29: qty 2

## 2023-04-29 MED ORDER — BLISTEX MEDICATED EX OINT: TOPICAL_OINTMENT | CUTANEOUS | Status: DC | PRN

## 2023-04-29 MED ORDER — ALPRAZOLAM 0.5 MG PO TABS
0.2500 mg | ORAL_TABLET | Freq: Three times a day (TID) | ORAL | Status: DC | PRN
  Administered 2023-04-30: 0.25 mg via ORAL
  Filled 2023-04-29: qty 1

## 2023-04-29 MED ORDER — ENSURE ENLIVE PO LIQD
237.0000 mL | Freq: Two times a day (BID) | ORAL | Status: DC
  Administered 2023-04-30 – 2023-05-08 (×10): 237 mL via ORAL

## 2023-04-29 MED ORDER — ONDANSETRON 4 MG PO TBDP: 8.0000 mg | ORAL_TABLET | Freq: Three times a day (TID) | ORAL | Status: DC | PRN

## 2023-04-29 MED ORDER — OXYCODONE HCL 5 MG PO TABS: 5.0000 mg | ORAL_TABLET | Freq: Three times a day (TID) | ORAL | Status: DC | PRN

## 2023-04-29 MED ORDER — SUCRALFATE 1 G PO TABS
1.0000 g | ORAL_TABLET | Freq: Three times a day (TID) | ORAL | Status: DC
  Filled 2023-04-29 (×4): qty 1

## 2023-04-29 MED ORDER — ROPINIROLE HCL 1 MG PO TABS
1.0000 mg | ORAL_TABLET | Freq: Every day | ORAL | Status: DC
  Administered 2023-04-29 – 2023-05-07 (×9): 1 mg via ORAL
  Filled 2023-04-29 (×10): qty 1

## 2023-04-29 MED ORDER — METHOCARBAMOL 500 MG PO TABS: 250.0000 mg | ORAL_TABLET | Freq: Every day | ORAL | Status: DC | PRN

## 2023-04-29 MED ORDER — BUPROPION HCL ER (SR) 100 MG PO TB12
100.0000 mg | ORAL_TABLET | Freq: Every day | ORAL | Status: DC
  Administered 2023-04-30: 100 mg via ORAL
  Filled 2023-04-29: qty 1

## 2023-04-29 NOTE — ED Notes (Signed)
Pt would like to over the meds with provider and stated that she feels weak.

## 2023-04-29 NOTE — Group Note (Unsigned)
Date:  04/29/2023 Time:  10:47 AM  Group Topic/Focus:  Movement therapy     Participation Level:  {BHH PARTICIPATION LEVEL:22264}  Participation Quality:  {BHH PARTICIPATION QUALITY:22265}  Affect:  {BHH AFFECT:22266}  Cognitive:  {BHH COGNITIVE:22267}  Insight: {BHH Insight2:20797}  Engagement in Group:  {BHH ENGAGEMENT IN RUEAV:40981}  Modes of Intervention:  {BHH MODES OF INTERVENTION:22269}  Additional Comments:  ***  Rodena Goldmann 04/29/2023, 10:47 AM

## 2023-04-29 NOTE — Progress Notes (Signed)
04/29/2023  1050  Called [facility] [phone]. Report given to Zina.

## 2023-04-29 NOTE — Progress Notes (Signed)
Pt is alert and oriented at the time of admission. Pt was able to identify name , Date of birth , where she was as well as the reason she was admitted. Pt mentioned feeling increasing depression, in the last couple of days, and a loss of interests in things she would normally be interested in.  Pt also mentioned that she could hear music when she was driving her car. She also mentioned that the "wheels of the car that brought her here sounded like music". Pt also reported anxiety and depression , which she rated at 4/10 and 8/10 respectively. Pt denies any SI/HI. Pt mentioned that this is her first admission unto a behavioral health facility. No history of sexual , physical or verbal abuse. Pt also mentioned having Hot flashes. She also stated that on he last Dr's visit she was told that she was prediabetic and her cholesterol level was elevated. Pt has no skin issues.   Pt reported having two cousins as her support system.  No children or immediate family members.  Both cousins where pts POA . Pt has been shown to her room, she is pleasant, calm cooperative.

## 2023-04-29 NOTE — Group Note (Signed)
LCSW Group Therapy Note   Group Date: 04/29/2023 Start Time: 1300 End Time: 1400  Description of Group The focus of this group was to determine what unhealthy coping techniques typically are used by group members and what healthy coping techniques would be helpful in coping with various problems. Patients were guided in becoming aware of the differences between healthy and unhealthy coping techniques. Patients were asked to identify 2-3 healthy coping skills they would like to learn to use more effectively, and many mentioned meditation, breathing, and relaxation. These were explained, samples demonstrated, and resources shared for how to learn more at discharge. At group closing, additional ideas of healthy coping skills were shared in a fun exercise.  Therapeutic Goals Patients learned that coping is what human beings do all day long to deal with various situations in their lives Patients defined and discussed healthy vs unhealthy coping techniques Patients identified their preferred coping techniques and identified whether these were healthy or unhealthy Patients determined 2-3 healthy coping skills they would like to become more familiar with and use more often, and practiced a few medications Patients provided support and ideas to each other   Summary of Patient Progress:  Did not attend   Therapeutic Modalities Cognitive Behavioral Therapy Motivational Interviewing

## 2023-04-29 NOTE — Progress Notes (Signed)
04/29/2023  1104  Safe transport called [phone] to transport patient to [facility]

## 2023-04-29 NOTE — Plan of Care (Signed)
  Problem: Education: Goal: Knowledge of General Education information will improve Description: Including pain rating scale, medication(s)/side effects and non-pharmacologic comfort measures 04/29/2023 1410 by Loreen Freud, RN Outcome: Progressing 04/29/2023 1406 by Loreen Freud, RN Outcome: Progressing   Problem: Health Behavior/Discharge Planning: Goal: Ability to manage health-related needs will improve 04/29/2023 1410 by Loreen Freud, RN Outcome: Progressing 04/29/2023 1406 by Loreen Freud, RN Outcome: Progressing   Problem: Clinical Measurements: Goal: Ability to maintain clinical measurements within normal limits will improve 04/29/2023 1410 by Loreen Freud, RN Outcome: Progressing 04/29/2023 1406 by Loreen Freud, RN Outcome: Progressing Goal: Will remain free from infection 04/29/2023 1410 by Loreen Freud, RN Outcome: Progressing 04/29/2023 1406 by Loreen Freud, RN Outcome: Progressing Goal: Diagnostic test results will improve 04/29/2023 1410 by Loreen Freud, RN Outcome: Progressing 04/29/2023 1406 by Loreen Freud, RN Outcome: Progressing Goal: Respiratory complications will improve 04/29/2023 1410 by Loreen Freud, RN Outcome: Progressing 04/29/2023 1406 by Loreen Freud, RN Outcome: Progressing Goal: Cardiovascular complication will be avoided 04/29/2023 1410 by Loreen Freud, RN Outcome: Progressing 04/29/2023 1406 by Loreen Freud, RN Outcome: Progressing   Problem: Activity: Goal: Risk for activity intolerance will decrease 04/29/2023 1410 by Loreen Freud, RN Outcome: Progressing 04/29/2023 1406 by Loreen Freud, RN Outcome: Progressing   Problem: Nutrition: Goal: Adequate nutrition will be maintained 04/29/2023 1410 by Loreen Freud, RN Outcome: Progressing 04/29/2023 1406 by Loreen Freud, RN Outcome: Progressing   Problem: Coping: Goal: Level of anxiety will decrease 04/29/2023 1410 by Loreen Freud, RN Outcome:  Progressing 04/29/2023 1406 by Loreen Freud, RN Outcome: Progressing   Problem: Elimination: Goal: Will not experience complications related to bowel motility 04/29/2023 1410 by Loreen Freud, RN Outcome: Progressing 04/29/2023 1406 by Loreen Freud, RN Outcome: Progressing Goal: Will not experience complications related to urinary retention 04/29/2023 1410 by Loreen Freud, RN Outcome: Progressing 04/29/2023 1406 by Loreen Freud, RN Outcome: Progressing   Problem: Pain Managment: Goal: General experience of comfort will improve 04/29/2023 1410 by Loreen Freud, RN Outcome: Progressing 04/29/2023 1406 by Loreen Freud, RN Outcome: Progressing   Problem: Safety: Goal: Ability to remain free from injury will improve 04/29/2023 1410 by Loreen Freud, RN Outcome: Progressing 04/29/2023 1406 by Loreen Freud, RN Outcome: Progressing   Problem: Skin Integrity: Goal: Risk for impaired skin integrity will decrease 04/29/2023 1410 by Loreen Freud, RN Outcome: Progressing 04/29/2023 1406 by Loreen Freud, RN Outcome: Progressing

## 2023-04-29 NOTE — ED Notes (Signed)
Pt wants a non chocolate flavor ensure.

## 2023-04-29 NOTE — ED Provider Notes (Signed)
Emergency Medicine Observation Re-evaluation Note  Emma Lopez is a 77 y.o. female, seen on rounds today.  Pt initially presented to the ED for complaints of Dehydration Currently, the patient is in her bed, calm and cooperative.  Physical Exam  BP (!) 155/94 (BP Location: Left Arm)   Pulse 62   Temp 98 F (36.7 C) (Oral)   Resp 16   Ht 5\' 2"  (1.575 m)   Wt 51.3 kg   SpO2 100%   BMI 20.69 kg/m  Physical Exam Cardiovascular:     Rate and Rhythm: Normal rate.  Pulmonary:     Effort: Pulmonary effort is normal.  Neurological:     Mental Status: She is alert.  Psychiatric:        Mood and Affect: Mood normal.      ED Course / MDM  EKG:   I have reviewed the labs performed to date as well as medications administered while in observation.  Recent changes in the last 24 hours include the patient having doubts about wanting to go to inpatient psychiatric hospitalization.  I discussed with the patient after lengthy conversation, patient was agreeable to inpatient hospitalization.  Plan  Current plan is for transfer to [facility].    Emma Simmonds T, DO 04/29/23 Emma Lopez

## 2023-04-29 NOTE — Plan of Care (Signed)

## 2023-04-29 NOTE — Progress Notes (Signed)
04/29/2023  1610  Patient signed Voluntary admission form. Faxed form to [facility] [phone].

## 2023-04-29 NOTE — Group Note (Signed)
Date:  04/29/2023 Time:  8:53 PM  Group Topic/Focus:  Goals Group:   The focus of this group is to help patients establish daily goals to achieve during treatment and discuss how the patient can incorporate goal setting into their daily lives to aide in recovery.    Participation Level:  Minimal  Participation Quality:  Appropriate  Affect:  Appropriate  Cognitive:  Appropriate  Insight: Appropriate  Engagement in Group:  Engaged  Modes of Intervention:  Discussion  Additional Comments:    Emma Lopez 04/29/2023, 8:53 PM

## 2023-04-29 NOTE — Tx Team (Signed)
Initial Treatment Plan 04/29/2023 2:28 PM ANNISSA ANDREONI ZOX:[id number]    PATIENT STRESSORS: Health problems     PATIENT STRENGTHS: Average or above average intelligence  Communication skills  Supportive family/friends    PATIENT IDENTIFIED PROBLEMS: DEPRESSION  ANXIETY  HIGH CHOLESTROL                 DISCHARGE CRITERIA:  Ability to meet basic life and health needs Improved stabilization in mood, thinking, and/or behavior Motivation to continue treatment in a less acute level of care Need for constant or close observation no longer present Safe-care adequate arrangements made  PRELIMINARY DISCHARGE PLAN: Attend aftercare/continuing care group Return to previous living arrangement  PATIENT/FAMILY INVOLVEMENT: This treatment plan has been presented to and reviewed with the patient, PATTI SHORB.   The patient and family have been given the opportunity to ask questions and make suggestions.  Yared Barefoot, RN 04/29/2023, 2:28 PM

## 2023-04-30 DIAGNOSIS — F332 Major depressive disorder, recurrent severe without psychotic features: Secondary | ICD-10-CM

## 2023-04-30 LAB — PROTIME-INR
INR: 2.3 — ABNORMAL HIGH (ref 0.8–1.2)
Prothrombin Time: 25.4 s — ABNORMAL HIGH (ref 11.4–15.2)

## 2023-04-30 MED ORDER — MIRTAZAPINE 15 MG PO TABS
15.0000 mg | ORAL_TABLET | Freq: Every day | ORAL | Status: DC
  Administered 2023-04-30: 15 mg via ORAL
  Filled 2023-04-30: qty 1

## 2023-04-30 MED ORDER — WARFARIN SODIUM 1 MG PO TABS
1.0000 mg | ORAL_TABLET | Freq: Once | ORAL | Status: AC
  Administered 2023-04-30: 1 mg via ORAL
  Filled 2023-04-30: qty 1

## 2023-04-30 NOTE — BHH Counselor (Signed)
LCSWA attempted to complete assessment w/pt.  Pt. Declined; stated she is not feeling well at the moment. LCSWA will attempt again later today.

## 2023-04-30 NOTE — Progress Notes (Signed)
D- Patient alert and oriented. Flat affect. Endorses anxiety 4/10.  Denies SI, HI, AVH, and pain.  A- Scheduled medications administered to patient, per MD orders. Support and encouragement provided.  Routine safety checks conducted every 15 minutes.  Patient informed to notify staff with problems or concerns. R- No adverse drug reactions noted. Patient contracts for safety at this time. Patient compliant with medications and treatment plan. Patient receptive, calm, and cooperative. Patient interacts well with others on the unit.  Patient remains safe at this time.

## 2023-04-30 NOTE — H&P (Signed)
Psychiatric Admission Assessment Adult  Patient Identification: Emma Lopez MRN:  [id number] Date of Evaluation:  04/30/2023 Chief Complaint:  Major depressive disorder, recurrent severe without psychotic features (HCC) [F33.2] Principal Diagnosis: Major depressive disorder, recurrent severe without psychotic features (HCC) Diagnosis:  Principal Problem:   Major depressive disorder, recurrent severe without psychotic features (HCC)  History of Present Illness: Emma Lopez is a 77 year old white female who was voluntarily admitted to inpatient psychiatry for depression, anxiety, and failure to thrive.  She has lost about 20 pounds.  She has been going to [facility] outpatient for the past few months and they have changed her medications.  She says that she did best on Lexapro for many years but then started having hot flashes and chest pain and going to the emergency room a lot where they found nothing.  She does have a history of DVT and is on Coumadin.  She tells me that she has had a lot of losses lately, namely a neighbor that passed away.  She lives alone and has never married and does not have any kids but she does have 2 cousins that are supportive.  She endorses anhedonia, difficulty sleeping, decreased appetite and fatigue.  She denies any suicidal ideation.  She denies any auditory or visual hallucinations.  Psychiatric consultation in the emergency room: Emma Lopez is a 77 y.o. female patient brought in by EMS for dehydration.  Patient reports fatigue, weakness, sadness and hopelessness. She has a history of DVTs, GERD, thyroid disease, chronic fatigue, depression and anxiety.    She has a long history of depression and anxiety that has never been well controlled with her medications.  In the last year, it has gotten progressively worse.  She has experienced a lot of loss and is struggling with the changes of aging. She is experiencing intense feelings of lonliness and lack of purpose.  She  has experienced at least 20 pounds of weight loss in the last year. She isn't sleeping well and is too tired to cook for herself.  She needed IV fluids due to dehydration. The depression is causing a failure to thrive.     She is alert, oriented x 4, calm, cooperative and attentive.  Her mood is depressed, anxious and hopeless with congruent affect. She has normal speech, and behavior.  Objectively there is no evidence of psychosis/mania or delusional thinking.  Patient is able to converse coherently, goal directed thoughts, no distractibility, or pre-occupation.  She also denies active suicidal ideation/self-harm/homicidal ideation, psychosis, and paranoia. She does present with passive suicidal ideation.  Associated Signs/Symptoms: Depression Symptoms:  depressed mood, anhedonia, insomnia, anxiety, weight loss, (Hypo) Manic Symptoms:  Irritable Mood, Anxiety Symptoms:  Excessive Worry, Psychotic Symptoms:   None PTSD Symptoms: HeNA Total Time spent with patient: 1 hour  Past Psychiatric History: Currently going to outpatient at [facility]  Is the patient at risk to self? Yes.    Has the patient been a risk to self in the past 6 months? Yes.    Has the patient been a risk to self within the distant past? Yes.    Is the patient a risk to others? No.  Has the patient been a risk to others in the past 6 months? No.  Has the patient been a risk to others within the distant past? No.   [place] Scale:  Flowsheet Row Admission (Current) from 04/29/2023 in [facility] ED from 04/26/2023 in [facility] Emergency Department at [facility] ED from  04/20/2023 in [facility] Emergency Department at [facility]  C-SSRS RISK CATEGORY No Risk No Risk No Risk        Prior Inpatient Therapy: No. If yes, describe  Prior Outpatient Therapy: Yes.   If yes, describe   Alcohol Screening: Patient refused Alcohol Screening Tool: Yes 1. How often do you have a drink  containing alcohol?: Never 2. How many drinks containing alcohol do you have on a typical day when you are drinking?: 1 or 2 3. How often do you have six or more drinks on one occasion?: Never AUDIT-C Score: 0 4. How often during the last year have you found that you were not able to stop drinking once you had started?: Never 5. How often during the last year have you failed to do what was normally expected from you because of drinking?: Never 6. How often during the last year have you needed a first drink in the morning to get yourself going after a heavy drinking session?: Never 7. How often during the last year have you had a feeling of guilt of remorse after drinking?: Never 8. How often during the last year have you been unable to remember what happened the night before because you had been drinking?: Never 9. Have you or someone else been injured as a result of your drinking?: No 10. Has a relative or friend or a doctor or another health worker been concerned about your drinking or suggested you cut down?: No Alcohol Use Disorder Identification Test Final Score (AUDIT): 0 Alcohol Brief Interventions/Follow-up: Patient Refused Substance Abuse History in the last 12 months:  No. Consequences of Substance Abuse: NA Previous Psychotropic Medications: Yes  Psychological Evaluations: Yes  Past Medical History:  Past Medical History:  Diagnosis Date   Abnormal EKG    Anxiety    Arthritis    oa   Bradycardia 12/14/2017   CIN III (cervical intraepithelial neoplasia III) 2000   Complication of anesthesia    did well last 2 times with procedures   Depression    DES exposure in utero    DVT (deep venous thrombosis) (HCC) 01/2009   LEFT LEG   DVT (deep venous thrombosis) (HCC) 10/30/2020   Dyspnea on exertion 10/2017   Elevated triglycerides with high cholesterol    Endometriosis    Fibromyalgia    GERD (gastroesophageal reflux disease)    History of colon polyps    History of hiatal  hernia    told by some md has, some say not   IBS (irritable bowel syndrome) 2015   Left bundle branch block    intermittent   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  low TSH 0.22 on 03/24/23.   Vitamin D deficiency     Past Surgical History:  Procedure Laterality Date   ABDOMINAL HYSTERECTOMY  2001   TAH, partial   APPENDECTOMY  1978   COLONOSCOPY WITH PROPOFOL N/A 12/07/2015   Procedure: COLONOSCOPY WITH PROPOFOL;  Surgeon: Charolett Bumpers, MD;  Location: [facility];  Service: Endoscopy;  Laterality: N/A;   COLONOSCOPY WITH PROPOFOL  N/A 01/11/2022   Procedure: COLONOSCOPY WITH PROPOFOL;  Surgeon: Kerin Salen, MD;  Location: [facility];  Service: Gastroenterology;  Laterality: N/A;   colonscopy  7 yrs ago   other in past   ESOPHAGOGASTRODUODENOSCOPY (EGD) WITH PROPOFOL N/A 12/07/2015   Procedure: ESOPHAGOGASTRODUODENOSCOPY (EGD) WITH PROPOFOL;  Surgeon: Charolett Bumpers, MD;  Location: [facility];  Service: Endoscopy;  Laterality: N/A;   ESOPHAGOGASTRODUODENOSCOPY ENDOSCOPY  yrs ago   FACIAL COSMETIC SURGERY     GYNECOLOGIC CRYOSURGERY     HEMOSTASIS CLIP PLACEMENT  01/11/2022   Procedure: HEMOSTASIS CLIP PLACEMENT;  Surgeon: Kerin Salen, MD;  Location: [facility];  Service: Gastroenterology;;   HOT HEMOSTASIS N/A 01/11/2022   Procedure: HOT HEMOSTASIS (ARGON PLASMA COAGULATION/BICAP);  Surgeon: Kerin Salen, MD;  Location: [facility];  Service: Gastroenterology;  Laterality: N/A;   MYOMECTOMY     POLYPECTOMY  01/11/2022   Procedure: POLYPECTOMY;  Surgeon: Kerin Salen, MD;  Location: [facility];  Service: Gastroenterology;;    REPLACEMENT TOTAL JOINT WRIST W/ PROSTHETIC IMPLANT Right 08/01/2021   TONSILLECTOMY     Family History:  Family History  Problem Relation Age of Onset   Depression Mother    Anxiety disorder Mother    Hypertension Mother    Breast cancer Mother        8's   Cancer Father        COLON   Depression Sister    Anxiety disorder Sister    Hypertension Sister    Stroke Sister    Breast cancer Maternal Aunt        Age 92's   Cancer Maternal Aunt        Melanoma   Alzheimer's disease Maternal Aunt    Breast cancer Maternal Aunt        70's   Cancer Maternal Aunt        Colon CA   Alzheimer's disease Maternal Aunt    Cancer Paternal Aunt        OVARIAN and COLON   Family Psychiatric  History: Unremarkable Tobacco Screening:  Social History   Tobacco Use  Smoking Status Former   Current packs/day: 0.00   Average packs/day: 0.1 packs/day for 6.0 years (0.6 ttl pk-yrs)   Types: Cigarettes   Start date: 08/01/1972   Quit date: 08/01/1978   Years since quitting: 44.7  Smokeless Tobacco Never  Tobacco Comments   only smoked 4-6 yrs 1 pack per month    [facility] Tobacco Counseling     Are you interested in Tobacco Cessation Medications?  No, patient refused Counseled patient on smoking cessation:  N/A, patient does not use tobacco products Reason Tobacco Screening Not Completed: Patient Refused Screening       Social History:  Social History   Substance and Sexual Activity  Alcohol Use Yes   Comment: 1 drink per month     Social History   Substance and Sexual Activity  Drug Use No    Additional Social History:                           Allergies:   Allergies  Allergen Reactions   Hydroxyzine Other (See Comments)    Vertigo    Sulfa Antibiotics Rash   Lab Results:  Results for orders placed or performed during the hospital encounter of 04/29/23 (from the past 48 hour(s))  Protime-INR     Status: Abnormal   Collection Time: 04/29/23  1:43 PM  Result  Value  Ref Range   Prothrombin Time 28.5 (H) 11.4 - 15.2 seconds   INR 2.6 (H) 0.8 - 1.2    Comment: (NOTE) INR goal varies based on device and disease states. Performed at [facility], [address] [place], [place] [address]   Protime-INR     Status: Abnormal   Collection Time: 04/30/23  6:37 AM  Result Value Ref Range   Prothrombin Time 25.4 (H) 11.4 - 15.2 seconds   INR 2.3 (H) 0.8 - 1.2    Comment: (NOTE) INR goal varies based on device and disease states. Performed at [facility], [address] [place], [place] [address]     Blood Alcohol level:  Lab Results  Component Value Date   [facility] <10 04/26/2023    Metabolic Disorder Labs:  No results found for: "HGBA1C", "MPG" No results found for: "PROLACTIN" No results found for: "CHOL", "TRIG", "HDL", "CHOLHDL", "VLDL", "LDLCALC"  Current Medications: Current Facility-Administered Medications  Medication Dose Route Frequency Provider Last Rate Last Admin   acetaminophen (TYLENOL) tablet 650 mg  650 mg Oral Q6H PRN Earney Navy, NP       ALPRAZolam Prudy Feeler) tablet 0.25 mg  0.25 mg Oral Q8H PRN Dahlia Byes C, NP   0.25 mg at 04/30/23 0713   alum & mag hydroxide-simeth (MAALOX/MYLANTA) 200-200-20 MG/5ML suspension 30 mL  30 mL Oral Q4H PRN Dahlia Byes C, NP       buPROPion ER (WELLBUTRIN SR) 12 hr tablet 100 mg  100 mg Oral Daily Welford Roche, Josephine C, NP   100 mg at 04/30/23 1029   feeding supplement (ENSURE ENLIVE / ENSURE PLUS) liquid 237 mL  237 mL Oral BID BM Onuoha, Josephine C, NP   237 mL at 04/30/23 1029   lip balm (BLISTEX) ointment   Topical PRN Earney Navy, NP       methocarbamol (ROBAXIN) tablet 250 mg  250 mg Oral Daily PRN Dahlia Byes C, NP       ondansetron (ZOFRAN-ODT) disintegrating tablet 8 mg  8 mg Oral Q8H PRN Onuoha, Josephine C, NP       oxyCODONE (Oxy IR/ROXICODONE) immediate release tablet 5 mg  5 mg Oral Q8H PRN Dahlia Byes C, NP        rOPINIRole (REQUIP) tablet 1 mg  1 mg Oral QHS Onuoha, Josephine C, NP   1 mg at 04/29/23 2127   sertraline (ZOLOFT) tablet 50 mg  50 mg Oral Daily Dahlia Byes C, NP   50 mg at 04/30/23 1029   sucralfate (CARAFATE) tablet 1 g  1 g Oral TID WC & HS Onuoha, Josephine C, NP       warfarin (COUMADIN) tablet 1 mg  1 mg Oral ONCE-1600 Sarina Ill, DO       Warfarin - Pharmacist Dosing Inpatient   Does not apply q1600 Earney Navy, NP       PTA Medications: Facility-Administered Medications Prior to Admission  Medication Dose Route Frequency Provider Last Rate Last Admin   denosumab (PROLIA) injection 60 mg  60 mg Subcutaneous Once Patton Salles, MD       Medications Prior to Admission  Medication Sig Dispense Refill Last Dose   ALPRAZolam (XANAX) 0.5 MG tablet Take 0.5 mg by mouth 2 (two) times daily as needed for anxiety.   prn at unk   Evolocumab (REPATHA SURECLICK) 140 MG/ML SOAJ Inject 140 mg into the skin every 14 (fourteen) days. 2 mL 11 Past Week  hydrOXYzine (ATARAX) 10 MG tablet TAKE 1 TABLET BY MOUTH THREE TIMES A DAY AS NEEDED 270 tablet 0 prn at unk   ondansetron (ZOFRAN-ODT) 8 MG disintegrating tablet Take 1 tablet (8 mg total) by mouth every 8 (eight) hours as needed for nausea. 20 tablet 0 prn at unk   oxyCODONE (ROXICODONE) 5 MG immediate release tablet Take 1 tablet (5 mg total) by mouth every 8 (eight) hours as needed for up to 6 doses for breakthrough pain. 6 tablet 0 prn at unk   rOPINIRole (REQUIP) 1 MG tablet Take 1 tablet (1 mg total) by mouth at bedtime. 90 tablet 2 Past Week   sertraline (ZOLOFT) 50 MG tablet Take 1/2 tablet by mouth for 7 days, then take one whole tablet daily (Patient taking differently: Take 50 mg by mouth See admin instructions.) 30 tablet 1 04/28/2023   Vilazodone HCl 20 MG TABS Take one tablet by mouth daily (Patient taking differently: 10 mg by Other route See admin instructions. Take 10 mg by mouth once a day through  04/29/2023) 30 tablet 1 Past Week   warfarin (COUMADIN) 1 MG tablet Take 1/2 tablet to 1 tablet by mouth daily or as directed by Anticoagulation Clinic. (Patient taking differently: Take 0.5-1 mg by mouth See admin instructions. Take 0.5 mg by mouth in the morning on Sun/Tues/Thurs and 1 mg on Mon/Wed/Fri/Sat) 90 tablet 1 Past Week   ALPRAZolam (XANAX) 0.25 MG tablet Take 1 tablet (0.25 mg total) by mouth every 8 (eight) hours as needed for anxiety. (Patient not taking: Reported on 04/29/2023) 30 tablet 0 Not Taking at unk   enoxaparin (LOVENOX) 60 MG/0.6ML injection Inject 0.6 mLs (60 mg total) into the skin every 12 (twelve) hours. (Patient not taking: Reported on 04/26/2023) 12 mL 1 Not Taking   methocarbamol (ROBAXIN) 500 MG tablet Take 250 mg by mouth daily as needed for muscle spasms. (Patient not taking: Reported on 04/29/2023)   Not Taking at unk   sucralfate (CARAFATE) 1 g tablet Take 1 tablet (1 g total) by mouth 4 (four) times daily -  with meals and at bedtime. (Patient not taking: Reported on 04/26/2023) 60 tablet 0 Not Taking    Musculoskeletal: Strength & Muscle Tone: within normal limits Gait & Station: normal Patient leans: N/A            Psychiatric Specialty Exam:  Presentation  General Appearance:  Appropriate for Environment  Eye Contact: Good  Speech: Clear and Coherent  Speech Volume: Normal  Handedness: Right   Mood and Affect  Mood: Depressed; Anxious; Hopeless  Affect: Congruent   Thought Process  Thought Processes: Coherent  Duration of Psychotic Symptoms:N/A Past Diagnosis of Schizophrenia or Psychoactive disorder: No data recorded Descriptions of Associations:Intact  Orientation:Full (Time, Place and Person)  Thought Content:WDL  Hallucinations:No data recorded Ideas of Reference:None  Suicidal Thoughts:No data recorded Homicidal Thoughts:No data recorded  Sensorium  Memory: Immediate Fair; Recent  Fair  Judgment: Intact  Insight: Fair   Art therapist  Concentration: Fair  Attention Span: Fair  Recall: Good  Fund of Knowledge: Good  Language: Good   Psychomotor Activity  Psychomotor Activity:No data recorded  Assets  Assets: Communication Skills; Desire for Improvement; Social Support; Housing; Transportation   Sleep  Sleep:No data recorded   Physical Exam: Physical Exam Constitutional:      Appearance: Normal appearance.  HENT:     Head: Normocephalic and atraumatic.     Mouth/Throat:     Pharynx: Oropharynx is clear.  Eyes:     Pupils: Pupils are equal, round, and reactive to light.  Cardiovascular:     Rate and Rhythm: Normal rate and regular rhythm.  Pulmonary:     Effort: Pulmonary effort is normal.     Breath sounds: Normal breath sounds.  Abdominal:     General: Abdomen is flat.     Palpations: Abdomen is soft.  Musculoskeletal:        General: Normal range of motion.  Skin:    General: Skin is warm and dry.  Neurological:     General: No focal deficit present.     Mental Status: She is alert. Mental status is at baseline.  Psychiatric:        Attention and Perception: Attention and perception normal.        Mood and Affect: Mood is anxious and depressed. Affect is flat.        Speech: Speech normal.        Behavior: Behavior normal. Behavior is cooperative.        Thought Content: Thought content normal.        Cognition and Memory: Cognition and memory normal.        Judgment: Judgment normal.    Review of Systems  Constitutional: Negative.   HENT: Negative.    Eyes: Negative.   Respiratory: Negative.    Cardiovascular: Negative.   Gastrointestinal: Negative.   Genitourinary: Negative.   Musculoskeletal: Negative.   Skin: Negative.   Neurological: Negative.   Endo/Heme/Allergies: Negative.   Psychiatric/Behavioral:  Positive for depression. The patient is nervous/anxious and has insomnia.    Blood pressure (!)  140/58, pulse 71, temperature 98.2 F (36.8 C), resp. rate 16, height 5' (1.524 m), weight 51.3 kg, SpO2 97%. Body mass index is 22.07 kg/m.  Treatment Plan Summary: Daily contact with patient to assess and evaluate symptoms and progress in treatment, Medication management, and Plan discontinue Wellbutrin and Zoloft and start Remeron.  Observation Level/Precautions:  15 minute checks  Laboratory:  CBC Chemistry Profile  Psychotherapy:    Medications:    Consultations:    Discharge Concerns:    Estimated LOS:  Other:     Physician Treatment Plan for Primary Diagnosis: Major depressive disorder, recurrent severe without psychotic features (HCC) Long Term Goal(s): Improvement in symptoms so as ready for discharge  Short Term Goals: Ability to identify changes in lifestyle to reduce recurrence of condition will improve, Ability to verbalize feelings will improve, Ability to disclose and discuss suicidal ideas, Ability to demonstrate self-control will improve, Ability to identify and develop effective coping behaviors will improve, Ability to maintain clinical measurements within normal limits will improve, Compliance with prescribed medications will improve, and Ability to identify triggers associated with substance abuse/mental health issues will improve  Physician Treatment Plan for Secondary Diagnosis: Principal Problem:   Major depressive disorder, recurrent severe without psychotic features (HCC)   I certify that inpatient services furnished can reasonably be expected to improve the patient's condition.    Sarina Ill, DO 9/29/202412:47 PM

## 2023-04-30 NOTE — Group Note (Signed)
Date:  04/30/2023 Time:  5:03 PM  Group Topic/Focus:  Self Care:   The focus of this group is to help patients understand the importance of self-care in order to improve or restore emotional, physical, spiritual, interpersonal, and financial health.    Participation Level:  Did Not Attend   Emma Lopez 04/30/2023, 5:03 PM

## 2023-04-30 NOTE — Progress Notes (Signed)
Pt denies SI/HI/AVH and verbally agrees to approach staff if these become apparent or before harming themselves/others. Endorses depression to this Charity fundraiser. Initially reported "I just don't think I will make it to group, its just not for me", but ended up attending group about halfway through session and participated appropriately. Patient had questions regarding Remeron that was started tonight, this RN answered questions. Patient voices no other concerns at this time. Scheduled medications administered to pt, per MD orders. RN provided support and encouragement to pt. Q15 min safety checks implemented and continued. Pt is safe on the unit. Plan of care on going and no other concerns expressed at this time.

## 2023-04-30 NOTE — Plan of Care (Signed)
  Problem: Safety: Goal: Ability to remain free from injury will improve Outcome: Progressing   Problem: Coping: Goal: Level of anxiety will decrease Outcome: Not Progressing   Problem: Education: Goal: Ability to state activities that reduce stress will improve Outcome: Not Progressing

## 2023-04-30 NOTE — Group Note (Signed)
Date:  04/30/2023 Time:  11:22 PM  Group Topic/Focus:  Making Healthy Choices:   The focus of this group is to help patients identify negative/unhealthy choices they were using prior to admission and identify positive/healthier coping strategies to replace them upon discharge.    Participation Level:  Minimal  Participation Quality:  Appropriate  Affect:  Appropriate  Cognitive:  Appropriate  Insight: Good  Engagement in Group:  Engaged  Modes of Intervention:  Discussion  Additional Comments:    Maeola Harman 04/30/2023, 11:22 PM

## 2023-04-30 NOTE — [facility] Suicide Risk Assessment (Signed)
[facility] Admission Suicide Risk Assessment   Nursing information obtained from:  Patient Demographic factors:  Age 77 or older Current Mental Status:  NA Loss Factors:  NA Historical Factors:  NA Risk Reduction Factors:  NA  Total Time spent with patient: 1 hour Principal Problem: Major depressive disorder, recurrent severe without psychotic features (HCC) Diagnosis:  Principal Problem:   Major depressive disorder, recurrent severe without psychotic features (HCC)  Emma Lopez is a 77 y.o. female patient brought in by EMS for dehydration.  Patient reports fatigue, weakness, sadness and hopelessness. She has a history of DVTs, GERD, thyroid disease, chronic fatigue, depression and anxiety.    She has a long history of depression and anxiety that has never been well controlled with her medications.  In the last year, it has gotten progressively worse.  She has experienced a lot of loss and is struggling with the changes of aging. She is experiencing intense feelings of lonliness and lack of purpose.  She has experienced at least 20 pounds of weight loss in the last year. She isn't sleeping well and is too tired to cook for herself.  She needed IV fluids due to dehydration. The depression is causing a failure to thrive.     She is alert, oriented x 4, calm, cooperative and attentive.  Her mood is depressed, anxious and hopeless with congruent affect. She has normal speech, and behavior.  Objectively there is no evidence of psychosis/mania or delusional thinking.  Patient is able to converse coherently, goal directed thoughts, no distractibility, or pre-occupation.  She also denies active suicidal ideation/self-harm/homicidal ideation, psychosis, and paranoia. She does present with passive suicidal ideation.   Continued Clinical Symptoms:  Alcohol Use Disorder Identification Test Final Score (AUDIT): 0 The "Alcohol Use Disorders Identification Test", Guidelines for Use in Primary Care, Second  Edition.  [employer] writer [facility]). Score between 0-7:  no or low risk or alcohol related problems. Score between 8-15:  moderate risk of alcohol related problems. Score between 16-19:  high risk of alcohol related problems. Score 20 or above:  warrants further diagnostic evaluation for alcohol dependence and treatment.   CLINICAL FACTORS:   Anorexia Nervosa Depression:   Anhedonia   Musculoskeletal: Strength & Muscle Tone: within normal limits Gait & Station: normal Patient leans: N/A  Psychiatric Specialty Exam:  Presentation  General Appearance:  Appropriate for Environment  Eye Contact: Good  Speech: Clear and Coherent  Speech Volume: Normal  Handedness: Right   Mood and Affect  Mood: Depressed; Anxious; Hopeless  Affect: Congruent   Thought Process  Thought Processes: Coherent  Descriptions of Associations:Intact  Orientation:Full (Time, Place and Person)  Thought Content:WDL  History of Schizophrenia/Schizoaffective disorder:No data recorded Duration of Psychotic Symptoms:No data recorded Hallucinations:No data recorded Ideas of Reference:None  Suicidal Thoughts:No data recorded Homicidal Thoughts:No data recorded  Sensorium  Memory: Immediate Fair; Recent Fair  Judgment: Intact  Insight: Fair   Art therapist  Concentration: Fair  Attention Span: Fair  Recall: Good  Fund of Knowledge: Good  Language: Good   Psychomotor Activity  Psychomotor Activity:No data recorded  Assets  Assets: Communication Skills; Desire for Improvement; Social Support; Housing; Transportation   Sleep  Sleep:No data recorded    Blood pressure (!) 140/58, pulse 71, temperature 98.2 F (36.8 C), resp. rate 16, height 5' (1.524 m), weight 51.3 kg, SpO2 97%. Body mass index is 22.07 kg/m.   COGNITIVE FEATURES THAT CONTRIBUTE TO RISK:  None    SUICIDE RISK:  Minimal: No identifiable suicidal ideation.  Patients  presenting with no risk factors but with morbid ruminations; may be classified as minimal risk based on the severity of the depressive symptoms  PLAN OF CARE: See orders  I certify that inpatient services furnished can reasonably be expected to improve the patient's condition.   Sarina Ill, DO 04/30/2023, 11:54 AM

## 2023-04-30 NOTE — Consult Note (Addendum)
ANTICOAGULATION CONSULT NOTE   Pharmacy Consult for Warfarin Indication:  Hx of VTE  Allergies  Allergen Reactions   Hydroxyzine Other (See Comments)    Vertigo    Sulfa Antibiotics Rash    Patient Measurements: Height: 5' (152.4 cm) Weight: 51.3 kg (113 lb) IBW/kg (Calculated) : 45.5  Vital Signs: Temp: 98.2 F (36.8 C) (09/29 0725) BP: 140/58 (09/29 0859) Pulse Rate: 71 (09/29 0859)  Labs: Recent Labs    04/29/23 0500 04/29/23 0750 04/29/23 1343 04/30/23 0637  HGB  --  15.4*  --   --   HCT  --  47.2*  --   --   PLT  --  287  --   --   LABPROT 31.9*  --  28.5* 25.4*  INR 3.1*  --  2.6* 2.3*  CREATININE 0.59  --   --   --     Estimated Creatinine Clearance: 43.6 mL/min (by C-G formula based on SCr of 0.59 mg/dL).   Medical History: Past Medical History:  Diagnosis Date   Abnormal EKG    Anxiety    Arthritis    oa   Bradycardia 12/14/2017   CIN III (cervical intraepithelial neoplasia III) 2000   Complication of anesthesia    did well last 2 times with procedures   Depression    DES exposure in utero    DVT (deep venous thrombosis) (HCC) 01/2009   LEFT LEG   DVT (deep venous thrombosis) (HCC) 10/30/2020   Dyspnea on exertion 10/2017   Elevated triglycerides with high cholesterol    Endometriosis    Fibromyalgia    GERD (gastroesophageal reflux disease)    History of colon polyps    History of hiatal hernia    told by some md has, some say not   IBS (irritable bowel syndrome) 2015   Left bundle branch block    intermittent   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  low TSH 0.22 on 03/24/23.   Vitamin D  deficiency     Medications:  Facility-Administered Medications Prior to Admission  Medication Dose Route Frequency Provider Last Rate Last Admin   denosumab (PROLIA) injection 60 mg  60 mg Subcutaneous Once Patton Salles, MD       Medications Prior to Admission  Medication Sig Dispense Refill Last Dose   ALPRAZolam (XANAX) 0.5 MG tablet Take 0.5 mg by mouth 2 (two) times daily as needed for anxiety.   prn at unk   Evolocumab (REPATHA SURECLICK) 140 MG/ML SOAJ Inject 140 mg into the skin every 14 (fourteen) days. 2 mL 11 Past Week   hydrOXYzine (ATARAX) 10 MG tablet TAKE 1 TABLET BY MOUTH THREE TIMES A DAY AS NEEDED 270 tablet 0 prn at unk   ondansetron (ZOFRAN-ODT) 8 MG disintegrating tablet Take 1 tablet (8 mg total) by mouth every 8 (eight) hours as needed for nausea. 20 tablet 0 prn at unk   oxyCODONE (ROXICODONE) 5 MG immediate release tablet Take 1 tablet (5 mg total) by mouth every 8 (eight) hours as needed for up to 6 doses  for breakthrough pain. 6 tablet 0 prn at unk   rOPINIRole (REQUIP) 1 MG tablet Take 1 tablet (1 mg total) by mouth at bedtime. 90 tablet 2 Past Week   sertraline (ZOLOFT) 50 MG tablet Take 1/2 tablet by mouth for 7 days, then take one whole tablet daily (Patient taking differently: Take 50 mg by mouth See admin instructions.) 30 tablet 1 04/28/2023   Vilazodone HCl 20 MG TABS Take one tablet by mouth daily (Patient taking differently: 10 mg by Other route See admin instructions. Take 10 mg by mouth once a day through 04/29/2023) 30 tablet 1 Past Week   warfarin (COUMADIN) 1 MG tablet Take 1/2 tablet to 1 tablet by mouth daily or as directed by Anticoagulation Clinic. (Patient taking differently: Take 0.5-1 mg by mouth See admin instructions. Take 0.5 mg by mouth in the morning on Sun/Tues/Thurs and 1 mg on Mon/Wed/Fri/Sat) 90 tablet 1 Past Week   ALPRAZolam (XANAX) 0.25 MG tablet Take 1 tablet (0.25 mg total) by mouth every 8 (eight) hours as needed for  anxiety. (Patient not taking: Reported on 04/29/2023) 30 tablet 0 Not Taking at unk   enoxaparin (LOVENOX) 60 MG/0.6ML injection Inject 0.6 mLs (60 mg total) into the skin every 12 (twelve) hours. (Patient not taking: Reported on 04/26/2023) 12 mL 1 Not Taking   methocarbamol (ROBAXIN) 500 MG tablet Take 250 mg by mouth daily as needed for muscle spasms. (Patient not taking: Reported on 04/29/2023)   Not Taking at unk   sucralfate (CARAFATE) 1 g tablet Take 1 tablet (1 g total) by mouth 4 (four) times daily -  with meals and at bedtime. (Patient not taking: Reported on 04/26/2023) 60 tablet 0 Not Taking   Scheduled:   buPROPion ER  100 mg Oral Daily   feeding supplement  237 mL Oral BID BM   rOPINIRole  1 mg Oral QHS   sertraline  50 mg Oral Daily   sucralfate  1 g Oral TID WC & HS   Warfarin - Pharmacist Dosing Inpatient   Does not apply q1600   Infusions:  PRN: acetaminophen, ALPRAZolam, alum & mag hydroxide-simeth, lip balm, methocarbamol, ondansetron, oxyCODONE Anti-infectives (From admission, onward)    None       Assessment:  77 y.o. female patient referred to lipid clinic by Eligha Bridegroom, NP. PMH is significant for recurrent DVT/PE with positive lupus anticoagulant (followed by Dr. Myna Hidalgo), HLD, sinus bradycardia, GERD, intermittent LBBB, hyperthyroidism and anxiety.  CTA with FFR done in 2024 for chest pain. CAC 1 (29th percentile), CT FFR analysis did not show any significant stenosis. There is visual stenosis at ostial LAD, but FFR is not significant. Med rec tech spoke with patient/family and last day of warfarin unknown. Pt was admitted to [facility] on 9/26 with a supratherapeutic INR. No major DDI.   Warfarin regimen based on notes: Warfarin 1 tablet daily except for 1/2 tablet on Sundays, Tuesdays, and Thursdays.   Date INR Warfarin Dose  9/28 2.6 HELD  9/29 2.3 1 mg      Goal of Therapy:  INR 2.5 - 3.5   pt is at a high risk for VTE. Goal obtain from Heme/Onc note from  10/2022.  Monitor platelets by anticoagulation protocol: Yes   Plan:  INR is slighlty subtherapeutic. Will give warfarin 1 mg x 1. Daily INR. CBC at least every 3 days.   Ronnald Ramp, PharmD, BCPS 04/30/2023,10:45 AM

## 2023-04-30 NOTE — [facility] Counselor (Signed)
Adult Comprehensive Assessment  Patient ID: Emma Lopez, female   DOB: [birth year], 77 y.o.   MRN: [id number]  Information Source: Information source: Patient  Current Stressors:  Patient states their primary concerns and needs for treatment are:: Worsening depression Patient states their goals for this hospitilization and ongoing recovery are:: To get on medicatrions that will be beneficial for depression Educational / Learning stressors: None Employment / Job issues: None Family Relationships: None at this time Surveyor, quantity / Lack of resources (include bankruptcy): None Housing / Lack of housing: N/A Physical health (include injuries & life threatening diseases): Major fatigue diagnosis causes significant stress and increases depressive symptoms Social relationships: Pt has a limited support network Substance abuse: Denies Bereavement / Loss: Pt loss stresser is due to losing close connections with two individuals  Living/Environment/Situation:  Living Arrangements: Alone Living conditions (as described by patient or guardian): Very good and comfortable Who else lives in the home?: Pt lives alone How long has patient lived in current situation?: Since 2021 What is atmosphere in current home: Comfortable  Family History:  Marital status: Single Are you sexually active?: No What is your sexual orientation?: Heterosexual Has your sexual activity been affected by drugs, alcohol, medication, or emotional stress?: No Does patient have children?: No  Childhood History:  By whom was/is the patient raised?: Both parents Description of patient's relationship with caregiver when they were a child: Good relationship with parents Patient's description of current relationship with people who raised him/her: N/A - Pt's parents deceased How were you disciplined when you got in trouble as a child/adolescent?: Spanked with a "fly swatter" or switch. Does patient have siblings?: Yes Number of  Siblings: 1 Description of patient's current relationship with siblings: N/A - pt's sibling deceased Did patient suffer any verbal/emotional/physical/sexual abuse as a child?: No Did patient suffer from severe childhood neglect?: No Has patient ever been sexually abused/assaulted/raped as an adolescent or adult?: No Was the patient ever a victim of a crime or a disaster?: No Witnessed domestic violence?: No Has patient been affected by domestic violence as an adult?: No  Education:  Highest grade of school patient has completed: Oncologist Currently a Consulting civil engineer?: No Learning disability?: No  Employment/Work Situation:   Employment Situation: Retired Passenger transport manager has Been Impacted by Current Illness: No What is the Longest Time Patient has Held a Job?: 15 years 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?: No  Financial Resources:   Surveyor, quantity resources: Writer, Medicare Does patient have a Lawyer or guardian?: No  Alcohol/Substance Abuse:   What has been your use of drugs/alcohol within the last 12 months?: None Alcohol/Substance Abuse Treatment Hx: Denies past history Has alcohol/substance abuse ever caused legal problems?: No  Social Support System:   Conservation officer, nature Support System: Good Describe Community Support System: 2 cousins that share POA of patient. Type of faith/religion: [employer] How does patient's faith help to cope with current illness?: "I pray for strength"  Leisure/Recreation:   Do You Have Hobbies?: Yes Leisure and Hobbies: Gardening and decorating  Strengths/Needs:   What is the patient's perception of their strengths?: Outgoing; Friendly Patient states they can use these personal strengths during their treatment to contribute to their recovery: By reaching out more for social engagement (decreased isolation) Patient states these barriers may affect/interfere  with their treatment: Transportation or the pt's energy level, Patient states these barriers may affect their return to the community: Pt  is concerned regarding placement at another facility possibly.  Discharge Plan:   Currently receiving community mental health services: No Patient states concerns and preferences for aftercare planning are: Getting on the proper medicatpn regime Patient states they will know when they are safe and ready for discharge when: Pt is willing to attempt mental health services post discharge however she does not want to feel forced into it. Does patient have access to transportation?: No (Pt has cousin that lives in town who may be able to assist) Does patient have financial barriers related to discharge medications?: No Plan for no access to transportation at discharge: Pt will speak with family regarding her need for transportation at discharge. Will patient be returning to same living situation after discharge?: Yes  Summary/Recommendations:   Summary and Recommendations (to be completed by the evaluator): Patient is 77 year old white female who was voluntarily admitted to inpatient psychiatry for depression, anxiety, and failure to thrive.  She has lost about 20 pounds.  She has been going to [facility] outpatient for the past few months and they have changed her medications.  She says that she did best on Lexapro for many years but then started having hot flashes and chest pain and going to the emergency room a lot where they found nothing.  She does have a history of DVT and is on Coumadin.  She tells me that she has had a lot of losses lately, namely a neighbor that passed away.  She lives alone and has never married and does not have any kids but she does have 2 cousins that are supportive.  She endorses anhedonia, difficulty sleeping, decreased appetite and fatigue.  She denies any suicidal ideation.  She denies any auditory or visual hallucinations.  [facility] Medicare. Recommendations include: crisis stabilization, therapeutic milieu, encourage group attendance and participation, medication management for mood stabilization and development of comprehensive mental wellness plan.  Emma Lopez. 04/30/2023

## 2023-04-30 NOTE — Plan of Care (Signed)

## 2023-04-30 NOTE — Progress Notes (Addendum)
AAOX4. States "I'm not feeling well says I need to go next door(ED)". Pt said "I'm dehydrated". Endorses anxiety 4/10. B/p 175/63, HR 62, RR 16. Xanax .25 mg po given PRN as ordered for anxiety. Tol well. Fluids encouraged and accepted. Plan of care continued.

## 2023-05-01 DIAGNOSIS — F332 Major depressive disorder, recurrent severe without psychotic features: Secondary | ICD-10-CM | POA: Diagnosis not present

## 2023-05-01 LAB — PROTIME-INR
INR: 2.1 — ABNORMAL HIGH (ref 0.8–1.2)
Prothrombin Time: 23.7 s — ABNORMAL HIGH (ref 11.4–15.2)

## 2023-05-01 MED ORDER — ESCITALOPRAM OXALATE 10 MG PO TABS
10.0000 mg | ORAL_TABLET | Freq: Every day | ORAL | Status: DC
  Administered 2023-05-01 – 2023-05-03 (×3): 10 mg via ORAL
  Filled 2023-05-01 (×3): qty 1

## 2023-05-01 MED ORDER — WARFARIN SODIUM 1 MG PO TABS
1.0000 mg | ORAL_TABLET | Freq: Once | ORAL | Status: AC
  Administered 2023-05-01: 1 mg via ORAL
  Filled 2023-05-01: qty 1

## 2023-05-01 MED ORDER — SERTRALINE HCL 50 MG PO TABS: 50.0000 mg | ORAL_TABLET | Freq: Every day | ORAL | Status: DC

## 2023-05-01 NOTE — Progress Notes (Signed)
[facility] MD Progress Note  05/01/2023 12:04 PM Emma Lopez  MRN:  [id number] Subjective: Emma Lopez is seen on rounds.  She says that she slept well but too well.  She feels hung over from the Remeron.  I told her sometimes people have that side effect.  She came in with complaints of not sleeping, decreased appetite, depression.  She recently started Zoloft we talked about different medications and she does not want to go back on Zoloft and she agreed to retry the Lexapro.  Nurses report no issues. Principal Problem: Major depressive disorder, recurrent severe without psychotic features (HCC) Diagnosis: Principal Problem:   Major depressive disorder, recurrent severe without psychotic features (HCC)  Total Time spent with patient: 15 minutes  Past Psychiatric History: Going to [facility] outpatient and has never been psychiatrically hospitalized in the past.  Past Medical History:  Past Medical History:  Diagnosis Date   Abnormal EKG    Anxiety    Arthritis    oa   Bradycardia 12/14/2017   CIN III (cervical intraepithelial neoplasia III) 2000   Complication of anesthesia    did well last 2 times with procedures   Depression    DES exposure in utero    DVT (deep venous thrombosis) (HCC) 01/2009   LEFT LEG   DVT (deep venous thrombosis) (HCC) 10/30/2020   Dyspnea on exertion 10/2017   Elevated triglycerides with high cholesterol    Endometriosis    Fibromyalgia    GERD (gastroesophageal reflux disease)    History of colon polyps    History of hiatal hernia    told by some md has, some say not   IBS (irritable bowel syndrome) 2015   Left bundle branch block    intermittent   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   Lupus anticoagulant disorder (HCC)    Multinodular goiter    Osteoporosis 12/2017   T score -3.2 stable from prior study   PE (pulmonary embolism)    Pneumonia 03/01/2021   PONV (postoperative nausea and vomiting)    Restless legs syndrome     Sinus bradycardia    Sleep disorder    Thyroid disease    HYPERTHYROIDISM.  low TSH 0.22 on 03/24/23.   Vitamin D deficiency     Past Surgical History:  Procedure Laterality Date   ABDOMINAL HYSTERECTOMY  2001   TAH, partial   APPENDECTOMY  1978   COLONOSCOPY WITH PROPOFOL N/A 12/07/2015   Procedure: COLONOSCOPY WITH PROPOFOL;  Surgeon: Charolett Bumpers, MD;  Location: [facility];  Service: Endoscopy;  Laterality: N/A;   COLONOSCOPY WITH PROPOFOL N/A 01/11/2022   Procedure: COLONOSCOPY WITH PROPOFOL;  Surgeon: Kerin Salen, MD;  Location: [facility];  Service: Gastroenterology;  Laterality: N/A;   colonscopy  7 yrs ago   other in past   ESOPHAGOGASTRODUODENOSCOPY (EGD) WITH PROPOFOL N/A 12/07/2015   Procedure: ESOPHAGOGASTRODUODENOSCOPY (EGD) WITH PROPOFOL;  Surgeon: Charolett Bumpers, MD;  Location: [facility];  Service: Endoscopy;  Laterality: N/A;   ESOPHAGOGASTRODUODENOSCOPY ENDOSCOPY  yrs ago   FACIAL COSMETIC SURGERY     GYNECOLOGIC CRYOSURGERY     HEMOSTASIS CLIP PLACEMENT  01/11/2022   Procedure: HEMOSTASIS CLIP PLACEMENT;  Surgeon: Kerin Salen, MD;  Location: [facility];  Service: Gastroenterology;;   HOT HEMOSTASIS N/A 01/11/2022   Procedure: HOT HEMOSTASIS (ARGON PLASMA COAGULATION/BICAP);  Surgeon: Kerin Salen, MD;  Location: [facility];  Service: Gastroenterology;  Laterality: N/A;   MYOMECTOMY     POLYPECTOMY  01/11/2022  Procedure: POLYPECTOMY;  Surgeon: Kerin Salen, MD;  Location: [facility];  Service: Gastroenterology;;   REPLACEMENT TOTAL JOINT WRIST W/ PROSTHETIC IMPLANT Right 08/01/2021   TONSILLECTOMY     Family History:  Family History  Problem Relation Age of Onset   Depression Mother    Anxiety disorder Mother    Hypertension Mother    Breast cancer Mother        67's   Cancer Father        COLON   Depression Sister    Anxiety disorder Sister    Hypertension Sister    Stroke Sister    Breast cancer Maternal Aunt        Age 36's   Cancer  Maternal Aunt        Melanoma   Alzheimer's disease Maternal Aunt    Breast cancer Maternal Aunt        70's   Cancer Maternal Aunt        Colon CA   Alzheimer's disease Maternal Aunt    Cancer Paternal Aunt        OVARIAN and COLON   Family Psychiatric  History: Unremarkable Social History:  Social History   Substance and Sexual Activity  Alcohol Use Yes   Comment: 1 drink per month     Social History   Substance and Sexual Activity  Drug Use No    Social History   Socioeconomic History   Marital status: Single    Spouse name: Not on file   Number of children: 0   Years of education: 16   Highest education level: Bachelor's degree (e.g., BA, AB, BS)  Occupational History   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  Tobacco Use   Smoking status: Former    Current packs/day: 0.00    Average packs/day: 0.1 packs/day for 6.0 years (0.6 ttl pk-yrs)    Types: Cigarettes    Start date: 08/01/1972    Quit date: 08/01/1978    Years since quitting: 44.7   Smokeless tobacco: Never   Tobacco comments:    only smoked 4-6 yrs 1 pack per month  Vaping Use   Vaping status: Never Used  Substance and Sexual Activity   Alcohol use: Yes    Comment: 1 drink per month   Drug use: No   Sexual activity: Not Currently    Birth control/protection: Surgical    Comment: HYSTERECTOMY-1st intercourse 27-Fewer than 5 partners  Other Topics Concern   Not on file  Social History Narrative   Lives alone in [place].    Social Determinants of Health   Financial Resource Strain: Not on file  Food Insecurity: Food Insecurity Present (04/29/2023)   Hunger Vital Sign    Worried About Running Out of Food in the Last Year: Not on file    Ran Out of Food in the Last Year: Sometimes true  Transportation Needs: No Transportation Needs (04/29/2023)   PRAPARE - Administrator, Civil Service (Medical): No    Lack of Transportation (Non-Medical): No  Physical Activity: Not on file   Stress: Not on file  Social Connections: Not on file   Additional Social History:                         Sleep: Good  Appetite:  Good  Current Medications: Current Facility-Administered Medications  Medication Dose Route Frequency Provider Last Rate Last Admin   acetaminophen (TYLENOL) tablet 650 mg  650 mg Oral  Q6H PRN Earney Navy, NP   650 mg at 04/30/23 1751   ALPRAZolam (XANAX) tablet 0.25 mg  0.25 mg Oral Q8H PRN Dahlia Byes C, NP   0.25 mg at 04/30/23 0713   alum & mag hydroxide-simeth (MAALOX/MYLANTA) 200-200-20 MG/5ML suspension 30 mL  30 mL Oral Q4H PRN Dahlia Byes C, NP       escitalopram (LEXAPRO) tablet 10 mg  10 mg Oral Daily Sarina Ill, DO   10 mg at 05/01/23 1145   feeding supplement (ENSURE ENLIVE / ENSURE PLUS) liquid 237 mL  237 mL Oral BID BM Onuoha, Josephine C, NP   237 mL at 05/01/23 1105   lip balm (BLISTEX) ointment   Topical PRN Earney Navy, NP       methocarbamol (ROBAXIN) tablet 250 mg  250 mg Oral Daily PRN Dahlia Byes C, NP       ondansetron (ZOFRAN-ODT) disintegrating tablet 8 mg  8 mg Oral Q8H PRN Onuoha, Josephine C, NP       oxyCODONE (Oxy IR/ROXICODONE) immediate release tablet 5 mg  5 mg Oral Q8H PRN Dahlia Byes C, NP       rOPINIRole (REQUIP) tablet 1 mg  1 mg Oral QHS Onuoha, Josephine C, NP   1 mg at 04/30/23 2100   warfarin (COUMADIN) tablet 1 mg  1 mg Oral ONCE-1600 Ronnald Ramp, [facility]       Warfarin - Pharmacist Dosing Inpatient   Does not apply q1600 Earney Navy, NP   Given at 04/30/23 1614    Lab Results:  Results for orders placed or performed during the hospital encounter of 04/29/23 (from the past 48 hour(s))  Protime-INR     Status: Abnormal   Collection Time: 04/29/23  1:43 PM  Result Value Ref Range   Prothrombin Time 28.5 (H) 11.4 - 15.2 seconds   INR 2.6 (H) 0.8 - 1.2    Comment: (NOTE) INR goal varies based on device and disease states. Performed at  [facility], [address] [place], [place] [address]   Protime-INR     Status: Abnormal   Collection Time: 04/30/23  6:37 AM  Result Value Ref Range   Prothrombin Time 25.4 (H) 11.4 - 15.2 seconds   INR 2.3 (H) 0.8 - 1.2    Comment: (NOTE) INR goal varies based on device and disease states. Performed at [facility], [address] [place], [place] [address]   Protime-INR     Status: Abnormal   Collection Time: 05/01/23  9:05 AM  Result Value Ref Range   Prothrombin Time 23.7 (H) 11.4 - 15.2 seconds   INR 2.1 (H) 0.8 - 1.2    Comment: (NOTE) INR goal varies based on device and disease states. Performed at [facility], [address] [place], [place] [address]     Blood Alcohol level:  Lab Results  Component Value Date   [facility] <10 04/26/2023    Metabolic Disorder Labs: No results found for: "HGBA1C", "MPG" No results found for: "PROLACTIN" No results found for: "CHOL", "TRIG", "HDL", "CHOLHDL", "VLDL", "LDLCALC"  Physical Findings: AIMS:  , ,  ,  ,    CIWA:    COWS:     Musculoskeletal: Strength & Muscle Tone: within normal limits Gait & Station: normal Patient leans: N/A  Psychiatric Specialty Exam:  Presentation  General Appearance:  Appropriate for Environment  Eye Contact: Good  Speech: Clear and Coherent  Speech Volume: Normal  Handedness: Right  Mood and Affect  Mood: Depressed; Anxious; Hopeless  Affect: Congruent   Thought Process  Thought Processes: Coherent  Descriptions of Associations:Intact  Orientation:Full (Time, Place and Person)  Thought Content:WDL  History of Schizophrenia/Schizoaffective disorder:No data recorded Duration of Psychotic Symptoms:No data recorded Hallucinations:No data recorded Ideas of Reference:None  Suicidal Thoughts:No data recorded Homicidal Thoughts:No data recorded  Sensorium  Memory: Immediate Fair; Recent  Fair  Judgment: Intact  Insight: Fair   Art therapist  Concentration: Fair  Attention Span: Fair  Recall: Good  Fund of Knowledge: Good  Language: Good   Psychomotor Activity  Psychomotor Activity:No data recorded  Assets  Assets: Communication Skills; Desire for Improvement; Social Support; Housing; Transportation   Sleep  Sleep:No data recorded  MENTAL STATUS EXAM: Patient is alert and oriented x 3, pleasant and cooperative, good eye contact, speech is normal and not pressured, mood is depressed; affect is flat; thought process: goal directed; thought content: Passive suicidal ideation; judgment is good, insight is good. Blood pressure (!) 143/55, pulse 68, temperature 97.8 F (36.6 C), resp. rate 18, height 5' (1.524 m), weight 51.3 kg, SpO2 98%. Body mass index is 22.07 kg/m.   Treatment Plan Summary: Daily contact with patient to assess and evaluate symptoms and progress in treatment, Medication management, and Plan discontinue Remeron and start Lexapro 10 mg/day.  Sarina Ill, DO 05/01/2023, 12:04 PM

## 2023-05-01 NOTE — Consult Note (Signed)
ANTICOAGULATION CONSULT NOTE   Pharmacy Consult for Warfarin Indication:  Hx of VTE  Allergies  Allergen Reactions   Hydroxyzine Other (See Comments)    Vertigo    Sulfa Antibiotics Rash    Patient Measurements: Height: 5' (152.4 cm) Weight: 51.3 kg (113 lb) IBW/kg (Calculated) : 45.5  Vital Signs: Temp: 97.8 F (36.6 C) (09/30 0715) BP: 143/55 (09/30 0715) Pulse Rate: 68 (09/30 0715)  Labs: Recent Labs    04/29/23 0500 04/29/23 0750 04/29/23 1343 04/30/23 0637  HGB  --  15.4*  --   --   HCT  --  47.2*  --   --   PLT  --  287  --   --   LABPROT 31.9*  --  28.5* 25.4*  INR 3.1*  --  2.6* 2.3*  CREATININE 0.59  --   --   --     Estimated Creatinine Clearance: 43.6 mL/min (by C-G formula based on SCr of 0.59 mg/dL).   Medical History: Past Medical History:  Diagnosis Date   Abnormal EKG    Anxiety    Arthritis    oa   Bradycardia 12/14/2017   CIN III (cervical intraepithelial neoplasia III) 2000   Complication of anesthesia    did well last 2 times with procedures   Depression    DES exposure in utero    DVT (deep venous thrombosis) (HCC) 01/2009   LEFT LEG   DVT (deep venous thrombosis) (HCC) 10/30/2020   Dyspnea on exertion 10/2017   Elevated triglycerides with high cholesterol    Endometriosis    Fibromyalgia    GERD (gastroesophageal reflux disease)    History of colon polyps    History of hiatal hernia    told by some md has, some say not   IBS (irritable bowel syndrome) 2015   Left bundle branch block    intermittent   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  low TSH 0.22 on 03/24/23.   Vitamin D  deficiency     Medications:  Facility-Administered Medications Prior to Admission  Medication Dose Route Frequency Provider Last Rate Last Admin   denosumab (PROLIA) injection 60 mg  60 mg Subcutaneous Once Patton Salles, MD       Medications Prior to Admission  Medication Sig Dispense Refill Last Dose   ALPRAZolam (XANAX) 0.5 MG tablet Take 0.5 mg by mouth 2 (two) times daily as needed for anxiety.   prn at unk   Evolocumab (REPATHA SURECLICK) 140 MG/ML SOAJ Inject 140 mg into the skin every 14 (fourteen) days. 2 mL 11 Past Week   hydrOXYzine (ATARAX) 10 MG tablet TAKE 1 TABLET BY MOUTH THREE TIMES A DAY AS NEEDED 270 tablet 0 prn at unk   ondansetron (ZOFRAN-ODT) 8 MG disintegrating tablet Take 1 tablet (8 mg total) by mouth every 8 (eight) hours as needed for nausea. 20 tablet 0 prn at unk   oxyCODONE (ROXICODONE) 5 MG immediate release tablet Take 1 tablet (5 mg total) by mouth every 8 (eight) hours as needed for up to 6 doses  for breakthrough pain. 6 tablet 0 prn at unk   rOPINIRole (REQUIP) 1 MG tablet Take 1 tablet (1 mg total) by mouth at bedtime. 90 tablet 2 Past Week   sertraline (ZOLOFT) 50 MG tablet Take 1/2 tablet by mouth for 7 days, then take one whole tablet daily (Patient taking differently: Take 50 mg by mouth See admin instructions.) 30 tablet 1 04/28/2023   Vilazodone HCl 20 MG TABS Take one tablet by mouth daily (Patient taking differently: 10 mg by Other route See admin instructions. Take 10 mg by mouth once a day through 04/29/2023) 30 tablet 1 Past Week   warfarin (COUMADIN) 1 MG tablet Take 1/2 tablet to 1 tablet by mouth daily or as directed by Anticoagulation Clinic. (Patient taking differently: Take 0.5-1 mg by mouth See admin instructions. Take 0.5 mg by mouth in the morning on Sun/Tues/Thurs and 1 mg on Mon/Wed/Fri/Sat) 90 tablet 1 Past Week   ALPRAZolam (XANAX) 0.25 MG tablet Take 1 tablet (0.25 mg total) by mouth every 8 (eight) hours as needed for  anxiety. (Patient not taking: Reported on 04/29/2023) 30 tablet 0 Not Taking at unk   enoxaparin (LOVENOX) 60 MG/0.6ML injection Inject 0.6 mLs (60 mg total) into the skin every 12 (twelve) hours. (Patient not taking: Reported on 04/26/2023) 12 mL 1 Not Taking   methocarbamol (ROBAXIN) 500 MG tablet Take 250 mg by mouth daily as needed for muscle spasms. (Patient not taking: Reported on 04/29/2023)   Not Taking at unk   sucralfate (CARAFATE) 1 g tablet Take 1 tablet (1 g total) by mouth 4 (four) times daily -  with meals and at bedtime. (Patient not taking: Reported on 04/26/2023) 60 tablet 0 Not Taking   Scheduled:   feeding supplement  237 mL Oral BID BM   mirtazapine  15 mg Oral QHS   rOPINIRole  1 mg Oral QHS   sucralfate  1 g Oral TID WC & HS   Warfarin - Pharmacist Dosing Inpatient   Does not apply q1600   Infusions:  PRN: acetaminophen, ALPRAZolam, alum & mag hydroxide-simeth, lip balm, methocarbamol, ondansetron, oxyCODONE Anti-infectives (From admission, onward)    None       Assessment:  77 y.o. female patient referred to lipid clinic by Eligha Bridegroom, NP. PMH is significant for recurrent DVT/PE with positive lupus anticoagulant (followed by Dr. Myna Hidalgo), HLD, sinus bradycardia, GERD, intermittent LBBB, hyperthyroidism and anxiety.  CTA with FFR done in 2024 for chest pain. CAC 1 (29th percentile), CT FFR analysis did not show any significant stenosis. There is visual stenosis at ostial LAD, but FFR is not significant. Med rec tech spoke with patient/family and last day of warfarin unknown. Pt was admitted to [facility] on 9/26 with a supratherapeutic INR. No major DDI.   Warfarin regimen based on notes: Warfarin 1 tablet daily except for 1/2 tablet on Sundays, Tuesdays, and Thursdays.   Date INR Warfarin Dose  9/28 2.6 HELD  9/29 2.3 1 mg   9/30 2.1 1 mg      Goal of Therapy:  INR 2.5 - 3.5   pt is at a high risk for VTE. Goal obtain from Heme/Onc note from 10/2022.  Monitor  platelets by anticoagulation protocol: Yes   Plan:  INR is subtherapeutic and trending down probably due to held doses. Will give warfarin 1 mg x 1 (home dose). Daily INR. CBC at least every 3 days.   Ronnald Ramp, PharmD, BCPS 05/01/2023,9:14 AM

## 2023-05-01 NOTE — Progress Notes (Signed)
Patient requesting to be tested for dementia since the neurologist found she had "markers in her blood". Specifically wants a mini-mental. Advised we don't perform them here in the [facility] but her pcp would be able to do that and any other tests we was concerned about. Pointed out that currently she does not exhibit any signs of dementia. Patient upset and states "I don't know her" but as I continued to speak with her she calmed down and stated she would follow up with pcp.

## 2023-05-01 NOTE — BH IP Treatment Plan (Signed)
Interdisciplinary Treatment and Diagnostic Plan Update  05/01/2023 Time of Session: 1:10 PM  [facility] MRN: [id number]  Principal Diagnosis: Major depressive disorder, recurrent severe without psychotic features (HCC)  Secondary Diagnoses: Principal Problem:   Major depressive disorder, recurrent severe without psychotic features (HCC)   Current Medications:  Current Facility-Administered Medications  Medication Dose Route Frequency Provider Last Rate Last Admin   acetaminophen (TYLENOL) tablet 650 mg  650 mg Oral Q6H PRN Dahlia Byes C, NP   650 mg at 04/30/23 1751   ALPRAZolam (XANAX) tablet 0.25 mg  0.25 mg Oral Q8H PRN Dahlia Byes C, NP   0.25 mg at 04/30/23 0713   alum & mag hydroxide-simeth (MAALOX/MYLANTA) 200-200-20 MG/5ML suspension 30 mL  30 mL Oral Q4H PRN Dahlia Byes C, NP       escitalopram (LEXAPRO) tablet 10 mg  10 mg Oral Daily Sarina Ill, DO   10 mg at 05/01/23 1145   feeding supplement (ENSURE ENLIVE / ENSURE PLUS) liquid 237 mL  237 mL Oral BID BM Onuoha, Josephine C, NP   237 mL at 05/01/23 1105   lip balm (BLISTEX) ointment   Topical PRN Earney Navy, NP       methocarbamol (ROBAXIN) tablet 250 mg  250 mg Oral Daily PRN Dahlia Byes C, NP       ondansetron (ZOFRAN-ODT) disintegrating tablet 8 mg  8 mg Oral Q8H PRN Onuoha, Josephine C, NP       oxyCODONE (Oxy IR/ROXICODONE) immediate release tablet 5 mg  5 mg Oral Q8H PRN Dahlia Byes C, NP       rOPINIRole (REQUIP) tablet 1 mg  1 mg Oral QHS Onuoha, Josephine C, NP   1 mg at 04/30/23 2100   warfarin (COUMADIN) tablet 1 mg  1 mg Oral ONCE-1600 Ronnald Ramp, [facility]       Warfarin - Pharmacist Dosing Inpatient   Does not apply q1600 Earney Navy, NP   Given at 04/30/23 1614   PTA Medications: Facility-Administered Medications Prior to Admission  Medication Dose Route Frequency Provider Last Rate Last Admin   denosumab (PROLIA) injection 60 mg  60 mg Subcutaneous  Once Patton Salles, MD       Medications Prior to Admission  Medication Sig Dispense Refill Last Dose   ALPRAZolam (XANAX) 0.5 MG tablet Take 0.5 mg by mouth 2 (two) times daily as needed for anxiety.   prn at unk   Evolocumab (REPATHA SURECLICK) 140 MG/ML SOAJ Inject 140 mg into the skin every 14 (fourteen) days. 2 mL 11 Past Week   hydrOXYzine (ATARAX) 10 MG tablet TAKE 1 TABLET BY MOUTH THREE TIMES A DAY AS NEEDED 270 tablet 0 prn at unk   ondansetron (ZOFRAN-ODT) 8 MG disintegrating tablet Take 1 tablet (8 mg total) by mouth every 8 (eight) hours as needed for nausea. 20 tablet 0 prn at unk   oxyCODONE (ROXICODONE) 5 MG immediate release tablet Take 1 tablet (5 mg total) by mouth every 8 (eight) hours as needed for up to 6 doses for breakthrough pain. 6 tablet 0 prn at unk   rOPINIRole (REQUIP) 1 MG tablet Take 1 tablet (1 mg total) by mouth at bedtime. 90 tablet 2 Past Week   sertraline (ZOLOFT) 50 MG tablet Take 1/2 tablet by mouth for 7 days, then take one whole tablet daily (Patient taking differently: Take 50 mg by mouth See admin instructions.) 30 tablet 1 04/28/2023   Vilazodone HCl 20 MG  TABS Take one tablet by mouth daily (Patient taking differently: 10 mg by Other route See admin instructions. Take 10 mg by mouth once a day through 04/29/2023) 30 tablet 1 Past Week   warfarin (COUMADIN) 1 MG tablet Take 1/2 tablet to 1 tablet by mouth daily or as directed by Anticoagulation Clinic. (Patient taking differently: Take 0.5-1 mg by mouth See admin instructions. Take 0.5 mg by mouth in the morning on Sun/Tues/Thurs and 1 mg on Mon/Wed/Fri/Sat) 90 tablet 1 Past Week   ALPRAZolam (XANAX) 0.25 MG tablet Take 1 tablet (0.25 mg total) by mouth every 8 (eight) hours as needed for anxiety. (Patient not taking: Reported on 04/29/2023) 30 tablet 0 Not Taking at unk   enoxaparin (LOVENOX) 60 MG/0.6ML injection Inject 0.6 mLs (60 mg total) into the skin every 12 (twelve) hours. (Patient not  taking: Reported on 04/26/2023) 12 mL 1 Not Taking   methocarbamol (ROBAXIN) 500 MG tablet Take 250 mg by mouth daily as needed for muscle spasms. (Patient not taking: Reported on 04/29/2023)   Not Taking at unk   sucralfate (CARAFATE) 1 g tablet Take 1 tablet (1 g total) by mouth 4 (four) times daily -  with meals and at bedtime. (Patient not taking: Reported on 04/26/2023) 60 tablet 0 Not Taking    Patient Stressors: Health problems    Patient Strengths: Average or above average intelligence  Communication skills  Supportive family/friends   Treatment Modalities: Medication Management, Group therapy, Case management,  1 to 1 session with clinician, Psychoeducation, Recreational therapy.   Physician Treatment Plan for Primary Diagnosis: Major depressive disorder, recurrent severe without psychotic features (HCC) Long Term Goal(s): Improvement in symptoms so as ready for discharge   Short Term Goals: Ability to identify changes in lifestyle to reduce recurrence of condition will improve Ability to verbalize feelings will improve Ability to disclose and discuss suicidal ideas Ability to demonstrate self-control will improve Ability to identify and develop effective coping behaviors will improve Ability to maintain clinical measurements within normal limits will improve Compliance with prescribed medications will improve Ability to identify triggers associated with substance abuse/mental health issues will improve  Medication Management: Evaluate patient's response, side effects, and tolerance of medication regimen.  Therapeutic Interventions: 1 to 1 sessions, Unit Group sessions and Medication administration.  Evaluation of Outcomes: Progressing  Physician Treatment Plan for Secondary Diagnosis: Principal Problem:   Major depressive disorder, recurrent severe without psychotic features (HCC)  Long Term Goal(s): Improvement in symptoms so as ready for discharge   Short Term Goals:  Ability to identify changes in lifestyle to reduce recurrence of condition will improve Ability to verbalize feelings will improve Ability to disclose and discuss suicidal ideas Ability to demonstrate self-control will improve Ability to identify and develop effective coping behaviors will improve Ability to maintain clinical measurements within normal limits will improve Compliance with prescribed medications will improve Ability to identify triggers associated with substance abuse/mental health issues will improve     Medication Management: Evaluate patient's response, side effects, and tolerance of medication regimen.  Therapeutic Interventions: 1 to 1 sessions, Unit Group sessions and Medication administration.  Evaluation of Outcomes: Progressing   RN Treatment Plan for Primary Diagnosis: Major depressive disorder, recurrent severe without psychotic features (HCC) Long Term Goal(s): Knowledge of disease and therapeutic regimen to maintain health will improve  Short Term Goals: Ability to remain free from injury will improve, Ability to verbalize frustration and anger appropriately will improve, Ability to demonstrate self-control, Ability to participate in  decision making will improve, Ability to verbalize feelings will improve, Ability to disclose and discuss suicidal ideas, Ability to identify and develop effective coping behaviors will improve, and Compliance with prescribed medications will improve  Medication Management: RN will administer medications as ordered by provider, will assess and evaluate patient's response and provide education to patient for prescribed medication. RN will report any adverse and/or side effects to prescribing provider.  Therapeutic Interventions: 1 on 1 counseling sessions, Psychoeducation, Medication administration, Evaluate responses to treatment, Monitor vital signs and CBGs as ordered, Perform/monitor CIWA, COWS, AIMS and Fall Risk screenings as  ordered, Perform wound care treatments as ordered.  Evaluation of Outcomes: Progressing   LCSW Treatment Plan for Primary Diagnosis: Major depressive disorder, recurrent severe without psychotic features (HCC) Long Term Goal(s): Safe transition to appropriate next level of care at discharge, Engage patient in therapeutic group addressing interpersonal concerns.  Short Term Goals: Engage patient in aftercare planning with referrals and resources, Increase social support, Increase ability to appropriately verbalize feelings, Increase emotional regulation, Facilitate acceptance of mental health diagnosis and concerns, Facilitate patient progression through stages of change regarding substance use diagnoses and concerns, Identify triggers associated with mental health/substance abuse issues, and Increase skills for wellness and recovery  Therapeutic Interventions: Assess for all discharge needs, 1 to 1 time with Social worker, Explore available resources and support systems, Assess for adequacy in community support network, Educate family and significant other(s) on suicide prevention, Complete Psychosocial Assessment, Interpersonal group therapy.  Evaluation of Outcomes: Progressing   Progress in Treatment: Attending groups: No. Participating in groups: No. Taking medication as prescribed: Yes. Toleration medication: Yes. Family/Significant other contact made: No, will contact:  CSW will contact if given permission  Patient understands diagnosis: Yes. Discussing patient identified problems/goals with staff: Yes. Medical problems stabilized or resolved: Yes. Denies suicidal/homicidal ideation: Yes. Issues/concerns per patient self-inventory: No. Other: None   New problem(s) identified: No, Describe:  None identified   New Short Term/Long Term Goal(s):  elimination of symptoms of psychosis, medication management for mood stabilization; elimination of SI thoughts; development of comprehensive  mental wellness plan.   Patient Goals:  "To get out of here"   Discharge Plan or Barriers: CSW will assist with appropriate discharge planning   Reason for Continuation of Hospitalization: Depression Medication stabilization  Estimated Length of Stay: 1 to 7 days   Last 3 [place] Suicide Severity Risk Score: Flowsheet Row Admission (Current) from 04/29/2023 in [facility] ED from 04/26/2023 in [facility] Department at [facility] ED from 04/20/2023 in [facility] Department at [facility]  C-SSRS RISK CATEGORY No Risk No Risk No Risk       Last PHQ 2/9 Scores:    02/01/2023   10:00 AM  Depression screen PHQ 2/9  Decreased Interest   Down, Depressed, Hopeless   PHQ - 2 Score   Altered sleeping   Tired, decreased energy   Change in appetite   Feeling bad or failure about yourself    Trouble concentrating   Moving slowly or fidgety/restless   Suicidal thoughts   PHQ-9 Score      Information is confidential and restricted. Go to Review Flowsheets to unlock data.    Scribe for Treatment Team: Elza Rafter, Theresia Majors 05/01/2023 1:56 PM

## 2023-05-01 NOTE — Group Note (Signed)
Recreation Therapy Group Note   Group Topic:General Recreation  Group Date: 05/01/2023 Start Time: 1400 End Time: 1445 Facilitators: Rosina Lowenstein, LRT, CTRS Location: Courtyard  Group Description: Outdoor Recreation. Patients had the option to play the card game rummy, ring toss, and listen to music while outside in the courtyard getting fresh air and sunlight. LRT and pts discussed things that they enjoy doing in their free time outside of the hospital.  Goal Area(s) Addressed: Patient will identify leisure interests.  Patient will practice healthy decision making. Patient will engage in recreation activity.   Affect/Mood: Appropriate   Participation Level: Active   Participation Quality: Independent   Behavior: Calm and Cooperative   Speech/Thought Process: Coherent   Insight: Good   Judgement: Good   Modes of Intervention: Activity   Patient Response to Interventions:  Receptive   Education Outcome:  Acknowledges education   Clinical Observations/Individualized Feedback: Lucindia was active in their participation of session activities and group discussion. Pt stretched and played ring toss while outside. Pt minimally interacted with LRT or peers, however was appropriate.    Plan: Continue to engage patient in RT group sessions 2-3x/week.   Rosina Lowenstein, LRT, CTRS 05/01/2023 3:30 PM

## 2023-05-01 NOTE — Group Note (Signed)
Date:  05/01/2023 Time:  10:45 PM  Group Topic/Focus:  Wellness Toolbox:   The focus of this group is to discuss various aspects of wellness, balancing those aspects and exploring ways to increase the ability to experience wellness.  Patients will create a wellness toolbox for use upon discharge.    Participation Level:  Minimal  Participation Quality:  Appropriate  Affect:  Appropriate  Cognitive:  Oriented  Insight: Improving  Engagement in Group:  Engaged  Modes of Intervention:  Discussion  Additional Comments:    Maeola Harman 05/01/2023, 10:45 PM

## 2023-05-01 NOTE — Plan of Care (Signed)
  Problem: Education: Goal: Knowledge of General Education information will improve Description Including pain rating scale, medication(s)/side effects and non-pharmacologic comfort measures Outcome: Progressing   Problem: Health Behavior/Discharge Planning: Goal: Ability to manage health-related needs will improve Outcome: Progressing   

## 2023-05-01 NOTE — Progress Notes (Signed)
Patient is voluntary admission to [facility] for [facility]. Patient is cooperative, c/o depression and believes the medication changes are not helping even before she tries them. Agreed to take the prescribed medication and educated on self-care. She joins her peers for meals and group and interacts well with peers. Somewhat cool towards staff with her interactions. Denies SI, HI, AVH and endorses depression and anxiety. Will continue to monitor.

## 2023-05-02 ENCOUNTER — Encounter (HOSPITAL_COMMUNITY): Payer: Self-pay

## 2023-05-02 ENCOUNTER — Ambulatory Visit (HOSPITAL_COMMUNITY): Admit: 2023-05-02 | Payer: PPO | Admitting: Gastroenterology

## 2023-05-02 DIAGNOSIS — F332 Major depressive disorder, recurrent severe without psychotic features: Secondary | ICD-10-CM | POA: Diagnosis not present

## 2023-05-02 LAB — PROTIME-INR
INR: 2.1 — ABNORMAL HIGH (ref 0.8–1.2)
Prothrombin Time: 23.4 s — ABNORMAL HIGH (ref 11.4–15.2)

## 2023-05-02 SURGERY — COLONOSCOPY WITH PROPOFOL
Anesthesia: Monitor Anesthesia Care

## 2023-05-02 MED ORDER — ARIPIPRAZOLE 2 MG PO TABS
2.0000 mg | ORAL_TABLET | Freq: Every day | ORAL | Status: DC
  Administered 2023-05-02 – 2023-05-08 (×7): 2 mg via ORAL
  Filled 2023-05-02 (×7): qty 1

## 2023-05-02 MED ORDER — WARFARIN SODIUM 1 MG PO TABS
1.0000 mg | ORAL_TABLET | Freq: Once | ORAL | Status: AC
  Administered 2023-05-02: 1 mg via ORAL
  Filled 2023-05-02: qty 1

## 2023-05-02 NOTE — Progress Notes (Signed)
[facility] MD Progress Note  05/02/2023  Emma Lopez  MRN:  [id number]  Emma Lopez is a 77 year old white female who was voluntarily admitted to inpatient psychiatry for depression, anxiety, and failure to thrive. She has lost about 20 pounds.   Subjective:   Chart reviewed, case discussed with staff in multidisciplinary meeting today, patient seen during rounds.  Patient continues to feel depressed and hopeless.  Patient shared that she has lost some friends and family members which she considered to be her support system.  Patient said" some of them passed away and some moved to a different area".  Patient reports that lately she has been feeling lonely and depressed.  She said that she has no desire or motivation to do anything.  She shared" some days it is too much to even cook or eat".  Patient was provided with support and reassurance.  We discussed the vicious cycle of depression leading to helplessness and hopelessness and then hopelessness and helplessness  making the depression worse.  We discussed different strategies to break this cycle.  Patient reports that she is willing to go to assisted living facility.  She will work on her options with Child psychotherapist on the unit and patient's cousin who patient claims is involved in patient's care.  Patient has passive suicidal thoughts.  She said  "some days I feel to end it all and get over with it".  Patient denies any intention or plan to harm herself on the unit.  She agrees to talk to staff in case she is overwhelmed with suicidal thoughts or depression.  Patient denies manic or psychotic symptoms.  Patient was encouraged to attend group and work on coping strategies and a safe discharge plan.   Principal Problem: Major depressive disorder, recurrent severe without psychotic features (HCC) Diagnosis: Principal Problem:   Major depressive disorder, recurrent severe without psychotic features [facility])   Past Psychiatric History: Patient reports history of  depression. Currently going to outpatient at [facility]   Past Medical History:  Past Medical History:  Diagnosis Date   Abnormal EKG    Anxiety    Arthritis    oa   Bradycardia 12/14/2017   CIN III (cervical intraepithelial neoplasia III) 2000   Complication of anesthesia    did well last 2 times with procedures   Depression    DES exposure in utero    DVT (deep venous thrombosis) (HCC) 01/2009   LEFT LEG   DVT (deep venous thrombosis) (HCC) 10/30/2020   Dyspnea on exertion 10/2017   Elevated triglycerides with high cholesterol    Endometriosis    Fibromyalgia    GERD (gastroesophageal reflux disease)    History of colon polyps    History of hiatal hernia    told by some md has, some say not   IBS (irritable bowel syndrome) 2015   Left bundle branch block    intermittent   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  low TSH 0.22 on 03/24/23.   Vitamin D deficiency     Past Surgical History:  Procedure Laterality Date  ABDOMINAL HYSTERECTOMY  2001   TAH, partial   APPENDECTOMY  1978   COLONOSCOPY WITH PROPOFOL N/A 12/07/2015   Procedure: COLONOSCOPY WITH PROPOFOL;  Surgeon: Charolett Bumpers, MD;  Location: [facility];  Service: Endoscopy;  Laterality: N/A;   COLONOSCOPY WITH PROPOFOL N/A 01/11/2022   Procedure: COLONOSCOPY WITH PROPOFOL;  Surgeon: Kerin Salen, MD;  Location: [facility];  Service: Gastroenterology;  Laterality: N/A;   colonscopy  7 yrs ago   other in past   ESOPHAGOGASTRODUODENOSCOPY (EGD) WITH PROPOFOL N/A 12/07/2015   Procedure: ESOPHAGOGASTRODUODENOSCOPY (EGD) WITH PROPOFOL;  Surgeon: Charolett Bumpers,  MD;  Location: [facility];  Service: Endoscopy;  Laterality: N/A;   ESOPHAGOGASTRODUODENOSCOPY ENDOSCOPY  yrs ago   FACIAL COSMETIC SURGERY     GYNECOLOGIC CRYOSURGERY     HEMOSTASIS CLIP PLACEMENT  01/11/2022   Procedure: HEMOSTASIS CLIP PLACEMENT;  Surgeon: Kerin Salen, MD;  Location: [facility];  Service: Gastroenterology;;   HOT HEMOSTASIS N/A 01/11/2022   Procedure: HOT HEMOSTASIS (ARGON PLASMA COAGULATION/BICAP);  Surgeon: Kerin Salen, MD;  Location: [facility];  Service: Gastroenterology;  Laterality: N/A;   MYOMECTOMY     POLYPECTOMY  01/11/2022   Procedure: POLYPECTOMY;  Surgeon: Kerin Salen, MD;  Location: [facility];  Service: Gastroenterology;;   REPLACEMENT TOTAL JOINT WRIST W/ PROSTHETIC IMPLANT Right 08/01/2021   TONSILLECTOMY     Family History:  Family History  Problem Relation Age of Onset   Depression Mother    Anxiety disorder Mother    Hypertension Mother    Breast cancer Mother        68's   Cancer Father        COLON   Depression Sister    Anxiety disorder Sister    Hypertension Sister    Stroke Sister    Breast cancer Maternal Aunt        Age 15's   Cancer Maternal Aunt        Melanoma   Alzheimer's disease Maternal Aunt    Breast cancer Maternal Aunt        70's   Cancer Maternal Aunt        Colon CA   Alzheimer's disease Maternal Aunt    Cancer Paternal Aunt        OVARIAN and COLON    Social History:  Social History   Substance and Sexual Activity  Alcohol Use Yes   Comment: 1 drink per month     Social History   Substance and Sexual Activity  Drug Use No    Social History   Socioeconomic History   Marital status: Single    Spouse name: Not on file   Number of children: 0   Years of education: 16   Highest education level: Bachelor's degree (e.g., BA, AB, BS)  Occupational History   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  Tobacco Use   Smoking status: Former    Current packs/day: 0.00    Average packs/day: 0.1  packs/day for 6.0 years (0.6 ttl pk-yrs)    Types: Cigarettes    Start date: 08/01/1972    Quit date: 08/01/1978    Years since quitting: 44.7   Smokeless tobacco: Never   Tobacco comments:    only smoked 4-6 yrs 1 pack per month  Vaping Use   Vaping status: Never Used  Substance and Sexual Activity   Alcohol use: Yes    Comment: 1 drink per month   Drug use: No   Sexual activity: Not Currently  Birth control/protection: Surgical    Comment: HYSTERECTOMY-1st intercourse 27-Fewer than 5 partners  Other Topics Concern   Not on file  Social History Narrative   Lives alone in [place].    Social Determinants of Health   Financial Resource Strain: Not on file  Food Insecurity: Food Insecurity Present (04/29/2023)   Hunger Vital Sign    Worried About Running Out of Food in the Last Year: Not on file    Ran Out of Food in the Last Year: Sometimes true  Transportation Needs: No Transportation Needs (04/29/2023)   PRAPARE - Administrator, Civil Service (Medical): No    Lack of Transportation (Non-Medical): No  Physical Activity: Not on file  Stress: Not on file  Social Connections: Not on file   Additional Social History:     Patient reports that she would like to consider option of going to assisted living facility.                    Sleep: Fair  Appetite:  Fair  Current Medications: Current Facility-Administered Medications  Medication Dose Route Frequency Provider Last Rate Last Admin   acetaminophen (TYLENOL) tablet 650 mg  650 mg Oral Q6H PRN Dahlia Byes C, NP   650 mg at 04/30/23 1751   ALPRAZolam (XANAX) tablet 0.25 mg  0.25 mg Oral Q8H PRN Dahlia Byes C, NP   0.25 mg at 04/30/23 0713   alum & mag hydroxide-simeth (MAALOX/MYLANTA) 200-200-20 MG/5ML suspension 30 mL  30 mL Oral Q4H PRN Dahlia Byes C, NP       escitalopram (LEXAPRO) tablet 10 mg  10 mg Oral Daily Sarina Ill, DO   10 mg at 05/02/23 1610   feeding  supplement (ENSURE ENLIVE / ENSURE PLUS) liquid 237 mL  237 mL Oral BID BM Onuoha, Josephine C, NP   237 mL at 05/01/23 1105   lip balm (BLISTEX) ointment   Topical PRN Earney Navy, NP       methocarbamol (ROBAXIN) tablet 250 mg  250 mg Oral Daily PRN Dahlia Byes C, NP       ondansetron (ZOFRAN-ODT) disintegrating tablet 8 mg  8 mg Oral Q8H PRN Onuoha, Josephine C, NP       oxyCODONE (Oxy IR/ROXICODONE) immediate release tablet 5 mg  5 mg Oral Q8H PRN Dahlia Byes C, NP       rOPINIRole (REQUIP) tablet 1 mg  1 mg Oral QHS Dahlia Byes C, NP   1 mg at 05/01/23 2057   Warfarin - Pharmacist Dosing Inpatient   Does not apply q1600 Earney Navy, NP   Given at 04/30/23 1614    Lab Results:  Results for orders placed or performed during the hospital encounter of 04/29/23 (from the past 48 hour(s))  Protime-INR     Status: Abnormal   Collection Time: 05/01/23  9:05 AM  Result Value Ref Range   Prothrombin Time 23.7 (H) 11.4 - 15.2 seconds   INR 2.1 (H) 0.8 - 1.2    Comment: (NOTE) INR goal varies based on device and disease states. Performed at [facility], [address] [place], [place] [address]   Protime-INR     Status: Abnormal   Collection Time: 05/02/23 10:15 AM  Result Value Ref Range   Prothrombin Time 23.4 (H) 11.4 - 15.2 seconds   INR 2.1 (H) 0.8 - 1.2    Comment: (NOTE) INR goal varies based on device and disease states. Performed at [facility], [address] [place], [place] [address]     Blood Alcohol level:  Lab Results  Component Value Date   [facility] <10 04/26/2023         Musculoskeletal: Strength & Muscle Tone: within normal limits Gait & Station: normal Patient leans: N/A  Psychiatric Specialty Exam:  Presentation  General Appearance:  Appropriate for Environment  Eye Contact: Good  Speech: Clear and Coherent  Speech Volume: Normal  Handedness: Right   Mood and Affect  Mood: Depressed  and hopeless  Affect: Congruent Restricted and tearful  Thought Process  Thought Processes: Coherent  Descriptions of Associations:Intact  Orientation:Full (Time, Place and Person)  Thought Content: Positive suicidal thoughts  History of Schizophrenia/Schizoaffective disorder:No Duration of Psychotic Symptoms:NA  Hallucinations: Denies Ideas of Reference:None  Suicidal Thoughts: Positive for suicide thoughts, no intention or plan to harm herself on the unit. Homicidal Thoughts: Denies  Sensorium  Memory: Immediate Fair; Recent Fair  Judgment: Limited  Insight: Fair   Chartered certified accountant: Fair  Attention Span: Fair  Recall: Good  Fund of Knowledge: Good  Language: Good   Psychomotor Activity  Psychomotor Activity: Decreased  Assets  Assets: Communication Skills; Desire for Improvement; Social Support; Housing; Transportation   Sleep  Sleep: Fair  Physical Exam: Physical Exam Constitutional:      Appearance: Normal appearance.  HENT:     Head: Normocephalic and atraumatic.     Nose: Nose normal.     Mouth/Throat:     Mouth: Mucous membranes are moist.  Eyes:     Pupils: Pupils are equal, round, and reactive to light.  Cardiovascular:     Rate and Rhythm: Normal rate.     Pulses: Normal pulses.  Pulmonary:     Effort: Pulmonary effort is normal.  Skin:    General: Skin is warm.  Neurological:     General: No focal deficit present.     Mental Status: She is alert and oriented to person, place, and time.    Review of Systems  Constitutional:  Negative for chills and fever.  HENT:  Negative for congestion, hearing loss and sore throat.   Eyes:  Negative for blurred vision and double vision.  Respiratory:  Negative for cough and shortness of breath.   Cardiovascular:  Negative for chest pain and palpitations.  Gastrointestinal:  Negative for nausea and vomiting.  Neurological:  Negative for dizziness and focal  weakness.  Psychiatric/Behavioral:  Positive for depression and suicidal ideas. The patient is nervous/anxious.    Blood pressure 138/70, pulse 60, temperature 98.2 F (36.8 C), resp. rate 18, height 5' (1.524 m), weight 51.3 kg, SpO2 99%. Body mass index is 22.07 kg/m.   Treatment Plan Summary: Daily contact with patient to assess and evaluate symptoms and progress in treatment and Medication management  Patient has been admitted to locked unit under safety precautions Depression: Will continue on Lexapro 10 mg by mouth daily.  Will consider increasing the dose to 20 mg if patient agrees to do so Will add Abilify 2 mg by mouth daily to augment the antidepressant effect of Lexapro Social worker consulted to get collateral and help with a safe discharge plan.  Patient is considering to go to an assisted living facility   Lewanda Rife, MD

## 2023-05-02 NOTE — Plan of Care (Signed)
  Problem: Activity: Goal: Risk for activity intolerance will decrease Outcome: Progressing   Problem: Nutrition: Goal: Adequate nutrition will be maintained Outcome: Progressing   Problem: Coping: Goal: Level of anxiety will decrease Outcome: Not Progressing   

## 2023-05-02 NOTE — Plan of Care (Signed)
New goal as of 05/01/23  Problem: Depression Goal: STG - Patient will identify 3 positive coping skills to decrease depressive symptoms within 5 recreation therapy group sessions Description: STG - Patient will identify 3 positive coping skills to decrease depressive symptoms within 5 recreation therapy group sessions Outcome: Not Applicable

## 2023-05-02 NOTE — Group Note (Signed)
Date:  05/02/2023 Time:  10:39 AM  Group Topic/Focus:  Goals Group:   The focus of this group is to help patients establish daily goals to achieve during treatment and discuss how the patient can incorporate goal setting into their daily lives to aide in recovery.    Participation Level:  Minimal  Participation Quality:  Appropriate  Affect:  Flat  Cognitive:  Appropriate  Insight: Limited  Engagement in Group:  Improving  Modes of Intervention:  Discussion and Education  Additional Comments:  none  Rodena Goldmann 05/02/2023, 10:39 AM

## 2023-05-02 NOTE — Group Note (Signed)
Recreation Therapy Group Note   Group Topic:Leisure Education  Group Date: 05/02/2023 Start Time: 1400 End Time: 1455 Facilitators: Rosina Lowenstein, LRT, CTRS Location: Courtyard  Group Description: Music. Patients encouraged to name their favorite song(s) for LRT to play song through speaker for group to hear. Patient educated on the definition of leisure and the importance of having different leisure interests outside of the hospital. Group discussed how leisure activities can often be used as Pharmacologist and that listening to music is one example.   Goal Area(s) Addressed:  Patient will identify a current leisure interest.  Patient will practice making a positive decision. Patient will have the opportunity to try a new leisure activity.   Affect/Mood: N/A   Participation Level: Minimal    Clinical Observations/Individualized Feedback: Emma Lopez came out late and went inside early. Pt asked if the music could be turned down and shared that she has "sensitive ears".  Plan: Continue to engage patient in RT group sessions 2-3x/week.   Rosina Lowenstein, LRT, CTRS 05/02/2023 3:34 PM

## 2023-05-02 NOTE — Consult Note (Signed)
ANTICOAGULATION CONSULT NOTE   Pharmacy Consult for Warfarin Indication:  Hx of VTE  Patient Measurements: Height: 5' (152.4 cm) Weight: 51.3 kg (113 lb) IBW/kg (Calculated) : 45.5  Labs: Recent Labs    04/30/23 0637 05/01/23 0905 05/02/23 1015  LABPROT 25.4* 23.7* 23.4*  INR 2.3* 2.1* 2.1*   Estimated Creatinine Clearance: 43.6 mL/min (by C-G formula based on SCr of 0.59 mg/dL).  Medical History: Past Medical History:  Diagnosis Date   Abnormal EKG    Anxiety    Arthritis    oa   Bradycardia 12/14/2017   CIN III (cervical intraepithelial neoplasia III) 2000   Complication of anesthesia    did well last 2 times with procedures   Depression    DES exposure in utero    DVT (deep venous thrombosis) (HCC) 01/2009   LEFT LEG   DVT (deep venous thrombosis) (HCC) 10/30/2020   Dyspnea on exertion 10/2017   Elevated triglycerides with high cholesterol    Endometriosis    Fibromyalgia    GERD (gastroesophageal reflux disease)    History of colon polyps    History of hiatal hernia    told by some md has, some say not   IBS (irritable bowel syndrome) 2015   Left bundle branch block    intermittent   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  low TSH 0.22 on 03/24/23.   Vitamin D deficiency    Assessment:  77 y.o. female patient referred to lipid clinic by Eligha Bridegroom, NP. PMH is significant for recurrent DVT/PE with positive lupus anticoagulant (followed by Dr. Myna Hidalgo), HLD, sinus bradycardia, GERD, intermittent LBBB, hyperthyroidism and anxiety.  CTA with FFR done in 2024 for chest pain. CAC 1 (29th percentile), CT FFR  analysis did not show any significant stenosis. There is visual stenosis at ostial LAD, but FFR is not significant. Med rec tech spoke with patient/family and last day of warfarin unknown. Pt was admitted to [facility] on 9/26 with a supratherapeutic INR. No major DDI.   Warfarin regimen based on notes: Warfarin 1 mg daily except for 1/2 tablet on Sundays, Tuesdays, and Thursdays.   Date INR Warfarin Dose  9/28 2.6 HELD  9/29 2.3 1 mg   9/30 2.1 1 mg   10/1 2.1 1 mg   Goal of Therapy:  INR 2.5 - 3.5   pt is at a high risk for VTE. Goal obtain from Heme/Onc note from 10/2022.  Monitor platelets by anticoagulation protocol: Yes   Plan:  --INR is subtherapeutic and stable. Probably due to held doses. Will give warfarin 1 mg tonight instead of the 0.5 mg per home regimen to try and target 2.5 - 3.5 goal. Daily INR. CBC at least every 3 days  Tressie Ellis 05/02/2023,3:29 PM

## 2023-05-02 NOTE — Progress Notes (Signed)
Patient admitted voluntarily on April 29, 2023 for complaints of worsening depression, anxiety, and difficulty coping with the process of aging.  She denies SI/HI/AVH. She endorses depression. "I have not gotten any better." Engaged in active listening. Support provided. Medications adm whole but patient might benefit from using applesauce with meds. Lengthy amount of time to swallow 1 pill. Patient agrees to try applesauce/yogurt.  Per pharmacist, patient is on coumadin and did not reach therapeutic range today. Dose adjusted accordingly.  Q15 minute unit checks in place.

## 2023-05-02 NOTE — Group Note (Signed)
Date:  05/02/2023 Time:  10:47 PM  Group Topic/Focus:  Goals Group:   The focus of this group is to help patients establish daily goals to achieve during treatment and discuss how the patient can incorporate goal setting into their daily lives to aide in recovery.    Participation Level:  Active  Participation Quality:  Appropriate  Affect:  Appropriate  Cognitive:  Appropriate  Insight: Appropriate  Engagement in Group:  Engaged  Modes of Intervention:  Discussion  Additional Comments:    Maeola Harman 05/02/2023, 10:47 PM

## 2023-05-02 NOTE — H&P (Signed)
CSW contacted Vivien Rossetti pts cousin for collateral [phone]).   Vernona Rieger states this has been going on since a year ago when a doctor told her she might be diabetic. She states she has began being overly health conscious.   Vernona Rieger states the pt lost her boyfriend, college roommate, and two weeks ago her cat died and when her cat died she spiraled.   Vernona Rieger states pt is hyper concerned about her health, "I would call her a hypochondriac" and reports pt goes to the ER multiple times    Vernona Rieger reports In July, they switched up her medicine and feels that "totally screwed her up"   Vernona Rieger states, "she was on lexapro, she was doing okay, but always tired"   She states pt has been working with Avelina Laine, NP on and outpatient basis.   Vernona Rieger states pt will not drive anywhere, she won't go to the grocery store and relies on others to take care of her. Vernona Rieger states she went had groceries delivered to pt and she would not go outside and get them due to anxiety.   Vernona Rieger reports pt went to the hospital, and they refused her because she had been going for heart attacks, and they would be anxiety attacks. Vernona Rieger states, " I dont want to sound cold but sometimes it's like she's crying wolf"   Vernona Rieger states they have a strong family history of mental illness, and the pt's sister went through something similar as the pt.  Vernona Rieger states, "What makes me scared is she said it would be better off if I'm not here".   Vernona Rieger states the pt says she wants to go home but still tells her she doesn't want to be here anymore, meaning not alive.    Vernona Rieger reports, "When we checked her house, she had all of her final documents on the table spread out"  Vernona Rieger reports that she thinks that pt is starving herself, whether that's intentional or not.   Vernona Rieger reports that pt has Patent attorney and states "To my knowledge no guns".  Vernona Rieger reports in March, pt went to neurologist. They said pt has markers for alzheimers. Vernona Rieger  states she is concerned about the ability of pt to take her own medication.   Vernona Rieger and pt's family feel assisted living is the best option for her. Vernona Rieger states she currently has her on waiting list at [facility] assisted living. Vernona Rieger also reports she is trying to line-up in home care for her with [name].   Vernona Rieger states that she would like pt to have home health if possible for medication management.   Vernona Rieger reports she has seen a change in pt's personality. Vernona Rieger states "She has lost her temper and been ugly to me and Maralyn Sago she is saying how tired she is and that she doesn't want to be here anymore "   CSW will inform provider of conversation.   Reynaldo Minium, MSW, [place] 05/02/2023 1:18 PM

## 2023-05-02 NOTE — Progress Notes (Signed)

## 2023-05-02 NOTE — BH Assessment (Signed)
Recreation Therapy Notes  INPATIENT RECREATION THERAPY ASSESSMENT  Patient Details Name: Emma Lopez MRN: [id number] DOB: August 09, 1945 Today's Date: 05/02/2023       Information Obtained From: Patient (In addition to chart review)  Able to Participate in Assessment/Interview: Yes  Patient Presentation: Labile  Reason for Admission (Per Patient): Active Symptoms ("worsening depression")  Patient Stressors: Death ("I have lost a lot of people close to me")  Coping Skills:    ("sleeping")  Leisure Interests (2+):  Ashby Dawes - [employer], Technical brewer - Vegetable gardening  Frequency of Recreation/Participation:  ("not much lately")  Awareness of Community Resources:  Yes  Current Use: No  If no, Barriers?:  ("I am just tired all the time")  Expressed Interest in [employer] Information: No  Patient Main Form of Transportation: Car ("i go to the grocery store but I dread it")  Patient Strengths:  "gardening and decorating"  Patient Identified Areas of Improvement:  "right now, my life"  Patient Goal for Hospitalization:  "I have no idea...try to get better but I am not encouraged"  Current SI (including self-harm):  No  Current HI:  No  Current AVH: No  Staff Intervention Plan: Group Attendance, Collaborate with Interdisciplinary Treatment Team  Consent to Intern Participation: N/A   Emma Lopez presented with nonchalant and shared "I am not interested in any of that" when LRT discussed what recreation therapy is and what groups would look like. Pt then began to open up and said that she may come to some groups, however wasn't sure. Of note, pt came to group earlier that day.   Pt shared that she is "tired all the time" and that sleeping is the way she copes with her depression. She shared that she has lost a lot of people who are close to here recently, including the loss of her sister in 2019. Pt shared that she also lost her cat 2 days ago and  that "it was sick".   Pt shared that she is lost likely going to an assisted living after discharge and shared that she has mixed feelings about it. Pt shared that she feels relief that she will have assistance in helping take care of her but also depressed and sad that she is getting older.  Pt shared that her depression has been worsening "all year". LRT encouraged pt to attend groups to work on coping skills for depression.   Emma Lopez LRT, CTRS Mariusz Jubb E Anaisabel Pederson 05/02/2023, 8:16 AM

## 2023-05-03 DIAGNOSIS — F332 Major depressive disorder, recurrent severe without psychotic features: Secondary | ICD-10-CM | POA: Diagnosis not present

## 2023-05-03 LAB — PROTIME-INR
INR: 2.1 — ABNORMAL HIGH (ref 0.8–1.2)
Prothrombin Time: 23.8 s — ABNORMAL HIGH (ref 11.4–15.2)

## 2023-05-03 MED ORDER — ESCITALOPRAM OXALATE 10 MG PO TABS
20.0000 mg | ORAL_TABLET | Freq: Every day | ORAL | Status: DC
  Administered 2023-05-04 – 2023-05-08 (×5): 20 mg via ORAL
  Filled 2023-05-03 (×5): qty 2

## 2023-05-03 MED ORDER — WARFARIN SODIUM 1 MG PO TABS
1.0000 mg | ORAL_TABLET | Freq: Once | ORAL | Status: AC
  Administered 2023-05-03: 1 mg via ORAL
  Filled 2023-05-03: qty 1

## 2023-05-03 NOTE — Progress Notes (Signed)
   05/02/23 2000  Psych Admission Type (Psych Patients Only)  Admission Status Voluntary  Psychosocial Assessment  Patient Complaints Depression  Eye Contact Brief  Facial Expression Sad  Affect Appropriate to circumstance  Speech Soft  Interaction Guarded  Motor Activity Slow  Appearance/Hygiene In scrubs  Behavior Characteristics Cooperative;Anxious  Mood Depressed  Thought Process  Coherency WDL  Content WDL  Delusions None reported or observed  Perception WDL  Hallucination None reported or observed  Judgment Limited  Confusion None  Danger to Self  Current suicidal ideation? Denies  Agreement Not to Harm Self Yes  Description of Agreement verbal  Danger to Others  Danger to Others None reported or observed   Progress note   D: Pt seen in dayroom. Pt denies SI, HI, AVH. Pt rates pain  0/10. Pt endorses anxiety and depression. States that she feels her depression is getting in the way of life. She never had children and neither did her sister. She is relying on her cousins who live close by to help her with her needs. "I've been having hot flashes and fatigue since mid-summer." Pt states she never experienced menopause because she had a hysterectomy. "The doctor thought it was my thyroid. But, tests are not significant. My gynecologist said it could be subclinical hypothyroidism. It's been frustrating not having an answer. I've been so depressed, I am fatigued and need help with food and stuff. I've lost too much weight this year." She doesn't want to be a burden on her family. Pt was taking Lexapro but then a doctor suggested discontinuing it in favor of other medication. "I've had several medication changed recently and nothing is helping."  No other concerns noted at this time.  A: Pt provided support and encouragement. Pt given scheduled medication as prescribed. PRNs as appropriate. Q15 min checks for safety.   R: Pt safe on the unit. Will continue to monitor.

## 2023-05-03 NOTE — Group Note (Signed)
[facility] LCSW Group Therapy Note   Group Date: 05/03/2023 Start Time: 1400 End Time: 1440   Type of Therapy/Topic:  Group Therapy:  Balance in Life  Participation Level:  Minimal   Description of Group:    This group will address the concept of balance and how it feels and looks when one is unbalanced. Patients will be encouraged to process areas in their lives that are out of balance, and identify reasons for remaining unbalanced. Facilitators will guide patients utilizing problem- solving interventions to address and correct the stressor making their life unbalanced. Understanding and applying boundaries will be explored and addressed for obtaining  and maintaining a balanced life. Patients will be encouraged to explore ways to assertively make their unbalanced needs known to significant others in their lives, using other group members and facilitator for support and feedback.  Therapeutic Goals: Patient will identify two or more emotions or situations they have that consume much of in their lives. Patient will identify signs/triggers that life has become out of balance:  Patient will identify two ways to set boundaries in order to achieve balance in their lives:  Patient will demonstrate ability to communicate their needs through discussion and/or role plays  Summary of Patient Progress:    Pt listened to group discussion but did not contribute to conversations unless prompted    Therapeutic Modalities:   Cognitive Behavioral Therapy Solution-Focused Therapy Assertiveness Training   Elza Rafter, LCSWA

## 2023-05-03 NOTE — Progress Notes (Signed)
Patient admitted voluntarily on April 29, 2023 for complaints of worsening depression, anxiety, and difficulty coping with the process of aging.   Patient presents with a sullen, depressive mood. "I think I need to leave here and arrange alternate care for myself." Support and encouragement provided. Patient attended both groups and communicated well with other staff.  Q15 minute unit checks in place.

## 2023-05-03 NOTE — Progress Notes (Signed)
   05/03/23 0556  15 Minute Checks  Location Bedroom  Visual Appearance Calm  Behavior Sleeping  Sleep (Behavioral Health Patients Only)  Calculate sleep? (Click Yes once per 24 hr at 0600 safety check) Yes  Documented sleep last 24 hours 8.25

## 2023-05-03 NOTE — Group Note (Signed)
Date:  05/03/2023 Time:  11:19 AM  Group Topic/Focus:  Self Care:   The focus of this group is to help patients understand the importance of self-care in order to improve or restore emotional, physical, spiritual, interpersonal, and financial health.    Participation Level:  Active  Participation Quality:  Appropriate  Affect:  Appropriate  Cognitive:  Appropriate  Insight: Appropriate  Engagement in Group:  Engaged  Modes of Intervention:  Activity and Discussion  Additional Comments:  none  Rodena Goldmann 05/03/2023, 11:19 AM

## 2023-05-03 NOTE — Group Note (Signed)
Date:  05/03/2023 Time:  9:03 PM  Group Topic/Focus:  Healthy Communication:   The focus of this group is to discuss communication, barriers to communication, as well as healthy ways to communicate with others.    Participation Level:  Active  Participation Quality:  Appropriate and Attentive  Affect:  Appropriate  Cognitive:  Alert and Appropriate  Insight: Appropriate and Good  Engagement in Group:  Engaged  Modes of Intervention:  Support  Additional Comments:    Jeannette How 05/03/2023, 9:03 PM

## 2023-05-03 NOTE — Plan of Care (Signed)
  Problem: Clinical Measurements: Goal: Ability to maintain clinical measurements within normal limits will improve Outcome: Progressing Goal: Will remain free from infection Outcome: Progressing Goal: Diagnostic test results will improve Outcome: Progressing Goal: Respiratory complications will improve Outcome: Progressing Goal: Cardiovascular complication will be avoided Outcome: Progressing   Problem: Activity: Goal: Risk for activity intolerance will decrease Outcome: Progressing   Problem: Nutrition: Goal: Adequate nutrition will be maintained Outcome: Progressing   Problem: Skin Integrity: Goal: Risk for impaired skin integrity will decrease Outcome: Progressing   

## 2023-05-03 NOTE — Plan of Care (Signed)
  Problem: Coping: Goal: Level of anxiety will decrease Outcome: Progressing   Problem: Elimination: Goal: Will not experience complications related to bowel motility Outcome: Progressing Goal: Will not experience complications related to urinary retention Outcome: Progressing   

## 2023-05-03 NOTE — Progress Notes (Signed)
[facility] MD Progress Note  05/03/2023  Emma Lopez  MRN:  [id number]  Emma Lopez is a 77 year old white female who was voluntarily admitted to inpatient psychiatry for depression, anxiety, and failure to thrive. She has lost about 20 pounds.   Subjective:   Chart reviewed, case discussed with staff in multidisciplinary meeting today, patient seen during rounds.  Patient patient reports she is getting more anxious being on the unit.  Patient said that she has lived by herself her whole life, and is difficult for her to be around people.  Patient was provided with support.  Patient said" I do not know I am depressed but I am not going to harm myself".  She denies thoughts of harming herself, no intention or plan.  She agrees to talk to staff in case she is overwhelmed with suicidal thoughts or depression.  Patient shared her cousin Emma Lopez's phone number.  Patient said Emma Lopez is working with home health care agency.  She said patient will have [name] helping her upon discharge from this facility.  Patient said her cousin also know some private sitters she can hire to be with patient upon discharge from here.   Patient denies manic or psychotic symptoms.  Patient was encouraged to attend group and work on coping strategies and a safe discharge plan.   Principal Problem: Major depressive disorder, recurrent severe without psychotic features (HCC) Diagnosis: Principal Problem:   Major depressive disorder, recurrent severe without psychotic features [facility])   Past Psychiatric History: Patient reports history of depression. Currently going to outpatient at [facility]   Past Medical History:  Past Medical History:  Diagnosis Date   Abnormal EKG    Anxiety    Arthritis    oa   Bradycardia 12/14/2017   CIN III (cervical intraepithelial neoplasia III) 2000   Complication of anesthesia    did well last 2 times with procedures   Depression    DES exposure in utero    DVT (deep venous thrombosis) (HCC)  01/2009   LEFT LEG   DVT (deep venous thrombosis) (HCC) 10/30/2020   Dyspnea on exertion 10/2017   Elevated triglycerides with high cholesterol    Endometriosis    Fibromyalgia    GERD (gastroesophageal reflux disease)    History of colon polyps    History of hiatal hernia    told by some md has, some say not   IBS (irritable bowel syndrome) 2015   Left bundle branch block    intermittent   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  low TSH 0.22 on 03/24/23.   Vitamin D deficiency     Past Surgical History:  Procedure Laterality Date   ABDOMINAL HYSTERECTOMY  2001   TAH, partial   APPENDECTOMY  1978   COLONOSCOPY WITH PROPOFOL N/A 12/07/2015   Procedure: COLONOSCOPY WITH PROPOFOL;  Surgeon: Charolett Bumpers, MD;  Location: [facility];  Service: Endoscopy;  Laterality: N/A;   COLONOSCOPY WITH PROPOFOL N/A 01/11/2022   Procedure: COLONOSCOPY WITH PROPOFOL;  Surgeon: Kerin Salen, MD;  Location: [facility];  Service: Gastroenterology;  Laterality: N/A;  colonscopy  7 yrs ago   other in past   ESOPHAGOGASTRODUODENOSCOPY (EGD) WITH PROPOFOL N/A 12/07/2015   Procedure: ESOPHAGOGASTRODUODENOSCOPY (EGD) WITH PROPOFOL;  Surgeon: Charolett Bumpers, MD;  Location: [facility];  Service: Endoscopy;  Laterality: N/A;   ESOPHAGOGASTRODUODENOSCOPY ENDOSCOPY  yrs ago   FACIAL COSMETIC SURGERY     GYNECOLOGIC CRYOSURGERY     HEMOSTASIS CLIP PLACEMENT  01/11/2022   Procedure: HEMOSTASIS CLIP PLACEMENT;  Surgeon: Kerin Salen, MD;  Location: [facility];  Service: Gastroenterology;;   HOT HEMOSTASIS N/A 01/11/2022   Procedure: HOT HEMOSTASIS (ARGON  PLASMA COAGULATION/BICAP);  Surgeon: Kerin Salen, MD;  Location: [facility];  Service: Gastroenterology;  Laterality: N/A;   MYOMECTOMY     POLYPECTOMY  01/11/2022   Procedure: POLYPECTOMY;  Surgeon: Kerin Salen, MD;  Location: [facility];  Service: Gastroenterology;;   REPLACEMENT TOTAL JOINT WRIST W/ PROSTHETIC IMPLANT Right 08/01/2021   TONSILLECTOMY     Family History:  Family History  Problem Relation Age of Onset   Depression Mother    Anxiety disorder Mother    Hypertension Mother    Breast cancer Mother        44's   Cancer Father        COLON   Depression Sister    Anxiety disorder Sister    Hypertension Sister    Stroke Sister    Breast cancer Maternal Aunt        Age 5's   Cancer Maternal Aunt        Melanoma   Alzheimer's disease Maternal Aunt    Breast cancer Maternal Aunt        70's   Cancer Maternal Aunt        Colon CA   Alzheimer's disease Maternal Aunt    Cancer Paternal Aunt        OVARIAN and COLON    Social History:  Social History   Substance and Sexual Activity  Alcohol Use Yes   Comment: 1 drink per month     Social History   Substance and Sexual Activity  Drug Use No    Social History   Socioeconomic History   Marital status: Single    Spouse name: Not on file   Number of children: 0   Years of education: 16   Highest education level: Bachelor's degree (e.g., BA, AB, BS)  Occupational History   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  Tobacco Use   Smoking status: Former    Current packs/day: 0.00    Average packs/day: 0.1 packs/day for 6.0 years (0.6 ttl pk-yrs)    Types: Cigarettes    Start date: 08/01/1972    Quit date: 08/01/1978    Years since quitting: 44.7   Smokeless tobacco: Never   Tobacco comments:    only smoked 4-6 yrs 1 pack per month  Vaping Use   Vaping status: Never Used  Substance and Sexual Activity   Alcohol use: Yes    Comment: 1 drink per month   Drug use: No   Sexual activity: Not  Currently    Birth control/protection: Surgical    Comment: HYSTERECTOMY-1st intercourse 27-Fewer than 5 partners  Other Topics Concern   Not on file  Social History Narrative   Lives alone in [place].    Social Determinants of Health   Financial Resource Strain: Not on file  Food Insecurity: Food Insecurity Present (04/29/2023)   Hunger Vital Sign    Worried About Running Out of Food in the Last Year:  Not on file    Ran Out of Food in the Last Year: Sometimes true  Transportation Needs: No Transportation Needs (04/29/2023)   PRAPARE - Administrator, Civil Service (Medical): No    Lack of Transportation (Non-Medical): No  Physical Activity: Not on file  Stress: Not on file  Social Connections: Not on file   Additional Social History:     Patient reports that she would like to consider option of going to an assisted living facility.                    Sleep: Fair  Appetite:  Fair  Current Medications: Current Facility-Administered Medications  Medication Dose Route Frequency Provider Last Rate Last Admin   acetaminophen (TYLENOL) tablet 650 mg  650 mg Oral Q6H PRN Dahlia Byes C, NP   650 mg at 04/30/23 1751   ALPRAZolam (XANAX) tablet 0.25 mg  0.25 mg Oral Q8H PRN Dahlia Byes C, NP   0.25 mg at 04/30/23 0713   alum & mag hydroxide-simeth (MAALOX/MYLANTA) 200-200-20 MG/5ML suspension 30 mL  30 mL Oral Q4H PRN Dahlia Byes C, NP       ARIPiprazole (ABILIFY) tablet 2 mg  2 mg Oral Daily Lewanda Rife, MD   2 mg at 05/03/23 0927   [START ON 05/04/2023] escitalopram (LEXAPRO) tablet 20 mg  20 mg Oral Daily Lewanda Rife, MD       feeding supplement (ENSURE ENLIVE / ENSURE PLUS) liquid 237 mL  237 mL Oral BID BM Onuoha, Josephine C, NP   237 mL at 05/03/23 1036   lip balm (BLISTEX) ointment   Topical PRN Earney Navy, NP       methocarbamol (ROBAXIN) tablet 250 mg  250 mg Oral Daily PRN Dahlia Byes C, NP       ondansetron  (ZOFRAN-ODT) disintegrating tablet 8 mg  8 mg Oral Q8H PRN Onuoha, Josephine C, NP       oxyCODONE (Oxy IR/ROXICODONE) immediate release tablet 5 mg  5 mg Oral Q8H PRN Dahlia Byes C, NP       rOPINIRole (REQUIP) tablet 1 mg  1 mg Oral QHS Onuoha, Josephine C, NP   1 mg at 05/02/23 2146   warfarin (COUMADIN) tablet 1 mg  1 mg Oral ONCE-1600 Hunt, Madison H, RPH       Warfarin - Pharmacist Dosing Inpatient   Does not apply q1600 Earney Navy, NP   Given at 04/30/23 1614    Lab Results:  Results for orders placed or performed during the hospital encounter of 04/29/23 (from the past 48 hour(s))  Protime-INR     Status: Abnormal   Collection Time: 05/02/23 10:15 AM  Result Value Ref Range   Prothrombin Time 23.4 (H) 11.4 - 15.2 seconds   INR 2.1 (H) 0.8 - 1.2    Comment: (NOTE) INR goal varies based on device and disease states. Performed at [facility], [address] [place], [place] [address]   Protime-INR     Status: Abnormal   Collection Time: 05/03/23  7:00 AM  Result Value Ref Range   Prothrombin Time 23.8 (H) 11.4 - 15.2 seconds   INR 2.1 (H) 0.8 - 1.2    Comment: (NOTE) INR goal varies based on device and disease states. Performed at [facility], [address] [place], [place] [address]     Blood Alcohol level:  Lab Results  Component Value Date   [facility] <10 04/26/2023  Musculoskeletal: Strength & Muscle Tone: within normal limits Gait & Station: normal Patient leans: N/A  Psychiatric Specialty Exam:  Presentation  General Appearance:  Appropriate for Environment  Eye Contact: Good  Speech: Clear and Coherent  Speech Volume: Normal  Handedness: Right   Mood and Affect  Mood: Anxious  Affect: Congruent Less constricted  Thought Process  Thought Processes: Coherent  Descriptions of Associations:Intact  Orientation:Full (Time, Place and Person)  Thought Content: Positive suicidal  thoughts  History of Schizophrenia/Schizoaffective disorder:No Duration of Psychotic Symptoms:NA  Hallucinations: Denies Ideas of Reference:None  Suicidal Thoughts: Denies suicidal thoughts today Homicidal Thoughts: Denies  Sensorium  Memory: Immediate Fair; Recent Fair  Judgment: Improving  Insight: Fair   Chartered certified accountant: Fair  Attention Span: Fair  Recall: Good  Fund of Knowledge: Good  Language: Good   Psychomotor Activity  Psychomotor Activity: Normal Assets  Assets: Communication Skills; Desire for Improvement; Social Support; Housing; Transportation   Sleep  Sleep: Fair  Physical Exam: Physical Exam Constitutional:      Appearance: Normal appearance.  HENT:     Head: Normocephalic and atraumatic.     Nose: Nose normal.     Mouth/Throat:     Mouth: Mucous membranes are moist.  Eyes:     Pupils: Pupils are equal, round, and reactive to light.  Cardiovascular:     Rate and Rhythm: Normal rate.     Pulses: Normal pulses.  Pulmonary:     Effort: Pulmonary effort is normal.  Skin:    General: Skin is warm.  Neurological:     General: No focal deficit present.     Mental Status: She is alert and oriented to person, place, and time.    Review of Systems  Constitutional:  Negative for chills and fever.  HENT:  Negative for congestion, hearing loss and sore throat.   Eyes:  Negative for blurred vision and double vision.  Respiratory:  Negative for cough and shortness of breath.   Cardiovascular:  Negative for chest pain and palpitations.  Gastrointestinal:  Negative for nausea and vomiting.  Neurological:  Negative for dizziness and focal weakness.   Blood pressure (!) 117/44, pulse (!) 57, temperature 98.1 F (36.7 C), resp. rate 18, height 5' (1.524 m), weight 51.3 kg, SpO2 95%. Body mass index is 22.07 kg/m.   Treatment Plan Summary: Daily contact with patient to assess and evaluate symptoms and progress in  treatment and Medication management  Patient has been admitted to locked unit under safety precautions Depression: Will increase the dose of Lexapro to 20 mg by mouth daily.  Starting 05/04/23 Continue Abilify 2 mg by mouth daily to augment the antidepressant effect of Lexapro Social worker consulted to get collateral and help with a safe discharge plan.  Patient is considering to go to an assisted living facility   Lewanda Rife, MD

## 2023-05-03 NOTE — Consult Note (Signed)
ANTICOAGULATION CONSULT NOTE   Pharmacy Consult for Warfarin Indication:  Hx of VTE  Patient Measurements: Height: 5' (152.4 cm) Weight: 51.3 kg (113 lb) IBW/kg (Calculated) : 45.5  Labs: Recent Labs    05/01/23 0905 05/02/23 1015 05/03/23 0700  LABPROT 23.7* 23.4* 23.8*  INR 2.1* 2.1* 2.1*   Estimated Creatinine Clearance: 43.6 mL/min (by C-G formula based on SCr of 0.59 mg/dL).  Medical History: Past Medical History:  Diagnosis Date   Abnormal EKG    Anxiety    Arthritis    oa   Bradycardia 12/14/2017   CIN III (cervical intraepithelial neoplasia III) 2000   Complication of anesthesia    did well last 2 times with procedures   Depression    DES exposure in utero    DVT (deep venous thrombosis) (HCC) 01/2009   LEFT LEG   DVT (deep venous thrombosis) (HCC) 10/30/2020   Dyspnea on exertion 10/2017   Elevated triglycerides with high cholesterol    Endometriosis    Fibromyalgia    GERD (gastroesophageal reflux disease)    History of colon polyps    History of hiatal hernia    told by some md has, some say not   IBS (irritable bowel syndrome) 2015   Left bundle branch block    intermittent   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  low TSH 0.22 on 03/24/23.   Vitamin D deficiency    Assessment:  77 y.o. female patient referred to lipid clinic by Eligha Bridegroom, NP. PMH is significant for recurrent DVT/PE with positive lupus anticoagulant (followed by Dr. Myna Hidalgo), HLD, sinus bradycardia, GERD, intermittent LBBB, hyperthyroidism and anxiety.  CTA with FFR done in 2024 for chest pain. CAC 1 (29th percentile), CT FFR  analysis did not show any significant stenosis. There is visual stenosis at ostial LAD, but FFR is not significant. Med rec tech spoke with patient/family and last day of warfarin unknown. Pt was admitted to [facility] on 9/26 with a supratherapeutic INR. No major DDI.   Warfarin regimen based on notes: Warfarin 1 mg daily except for 1/2 tablet on Sundays, Tuesdays, and Thursdays.   Date INR Warfarin Dose  9/28 2.6 HELD  9/29 2.3 1 mg   9/30 2.1 1 mg   10/1 2.1 1 mg  10/2 2.1 1 mg (ordered)   Goal of Therapy:  INR 2.5 - 3.5   pt is at a high risk for VTE. Goal obtain from Heme/Onc note from 10/2022.  Monitor platelets by anticoagulation protocol: Yes   Plan:  --INR is subtherapeutic and stable. Probably due to held doses. Will give home dose of warfarin 1 mg x1. Will hopefully see INR trend back towards goal tomorrow since held dose was 5 days ago --Daily INR.  --CBC at least every 3 days  Merryl Hacker, PharmD Clinical Pharmacist 05/03/2023,8:13 AM

## 2023-05-04 ENCOUNTER — Other Ambulatory Visit: Payer: PPO

## 2023-05-04 ENCOUNTER — Ambulatory Visit: Payer: PPO | Admitting: Medical Oncology

## 2023-05-04 DIAGNOSIS — F332 Major depressive disorder, recurrent severe without psychotic features: Secondary | ICD-10-CM | POA: Diagnosis not present

## 2023-05-04 LAB — CBC
HCT: 42.6 % (ref 36.0–46.0)
Hemoglobin: 14.1 g/dL (ref 12.0–15.0)
MCH: 31.3 pg (ref 26.0–34.0)
MCHC: 33.1 g/dL (ref 30.0–36.0)
MCV: 94.7 fL (ref 80.0–100.0)
Platelets: 274 10*3/uL (ref 150–400)
RBC: 4.5 MIL/uL (ref 3.87–5.11)
RDW: 13.9 % (ref 11.5–15.5)
WBC: 8.3 10*3/uL (ref 4.0–10.5)
nRBC: 0 % (ref 0.0–0.2)

## 2023-05-04 LAB — LIPID PANEL
Cholesterol: 138 mg/dL (ref 0–200)
HDL: 61 mg/dL (ref >40)
LDL Cholesterol: 66 mg/dL (ref 0–99)
Total CHOL/HDL Ratio: 2.3 {ratio}
Triglycerides: 57 mg/dL (ref <150)
VLDL: 11 mg/dL (ref 0–40)

## 2023-05-04 LAB — HEMOGLOBIN A1C
Hgb A1c MFr Bld: 5.6 % (ref 4.8–5.6)
Mean Plasma Glucose: 114.02 mg/dL

## 2023-05-04 LAB — PROTIME-INR
INR: 2.2 — ABNORMAL HIGH (ref 0.8–1.2)
Prothrombin Time: 24.5 s — ABNORMAL HIGH (ref 11.4–15.2)

## 2023-05-04 MED ORDER — LOPERAMIDE HCL 2 MG PO CAPS
2.0000 mg | ORAL_CAPSULE | ORAL | Status: DC | PRN
  Administered 2023-05-04: 2 mg via ORAL
  Filled 2023-05-04: qty 1

## 2023-05-04 MED ORDER — WARFARIN SODIUM 1 MG PO TABS
1.0000 mg | ORAL_TABLET | Freq: Once | ORAL | Status: AC
  Administered 2023-05-04: 1 mg via ORAL
  Filled 2023-05-04: qty 1

## 2023-05-04 NOTE — Progress Notes (Signed)
Patient pleasant and cooperative. Endorses fatigue, hot flashes, anxiety and depression. Denies SI/HI and AVH.  Denies pain. Patient returned to her room after breakfast to rest.  Compliant with scheduled medications.  Patient expressed difficulty swallowing pills and  needed multiple sips of water for each pill. No coughing or choking. 15 min checks in place for safety.  Patient upset by the noise on the unit (doors slamming, TV in dayroom, staff and patient's talking).  Isolated to room for the majority of the day.  Participated in RT group in courtyard.   Patient reports 3 episodes of diarrhea this morning. Dr. Marval Regal made aware.  Imodium given as ordered and was effective.

## 2023-05-04 NOTE — Consult Note (Signed)
ANTICOAGULATION CONSULT NOTE   Pharmacy Consult for Warfarin Indication:  Hx of VTE  Patient Measurements: Height: 5' (152.4 cm) Weight: 51.3 kg (113 lb) IBW/kg (Calculated) : 45.5  Labs: Recent Labs    05/02/23 1015 05/03/23 0700 05/04/23 0709  HGB  --   --  14.1  HCT  --   --  42.6  PLT  --   --  274  LABPROT 23.4* 23.8* 24.5*  INR 2.1* 2.1* 2.2*   Estimated Creatinine Clearance: 43.6 mL/min (by C-G formula based on SCr of 0.59 mg/dL).  Medical History: Past Medical History:  Diagnosis Date   Abnormal EKG    Anxiety    Arthritis    oa   Bradycardia 12/14/2017   CIN III (cervical intraepithelial neoplasia III) 2000   Complication of anesthesia    did well last 2 times with procedures   Depression    DES exposure in utero    DVT (deep venous thrombosis) (HCC) 01/2009   LEFT LEG   DVT (deep venous thrombosis) (HCC) 10/30/2020   Dyspnea on exertion 10/2017   Elevated triglycerides with high cholesterol    Endometriosis    Fibromyalgia    GERD (gastroesophageal reflux disease)    History of colon polyps    History of hiatal hernia    told by some md has, some say not   IBS (irritable bowel syndrome) 2015   Left bundle branch block    intermittent   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  low TSH 0.22 on 03/24/23.   Vitamin D deficiency    Assessment:  77 y.o. female patient referred to lipid clinic by Eligha Bridegroom, NP. PMH is significant for recurrent DVT/PE with positive lupus anticoagulant (followed by Dr. Myna Hidalgo), HLD, sinus bradycardia, GERD, intermittent LBBB, hyperthyroidism and anxiety.  CTA with FFR  done in 2024 for chest pain. CAC 1 (29th percentile), CT FFR analysis did not show any significant stenosis. There is visual stenosis at ostial LAD, but FFR is not significant. Med rec tech spoke with patient/family and last day of warfarin unknown. Pt was admitted to [facility] on 9/26 with a supratherapeutic INR. No major DDI.   Warfarin regimen based on notes: Warfarin 1 mg daily except for 1/2 tablet on Sundays, Tuesdays, and Thursdays.   Date INR Warfarin Dose  9/28 2.6 HELD  9/29 2.3 1 mg   9/30 2.1 1 mg   10/1 2.1 1 mg  10/2 2.1 1 mg  10/3 2.2 1 mg (ordered)   Goal of Therapy:  INR 2.5 - 3.5   pt is at a high risk for VTE. Goal obtain from Heme/Onc note from 10/2022.  Monitor platelets by anticoagulation protocol: Yes   Plan:  --INR is subtherapeutic and stable. Probably due to held doses. Will give warfarin 1 mg x1 tonight instead of normal home dose of 0.5 mg. --Daily INR.  --CBC at least every 3 days  Barrie Folk, PharmD Clinical Pharmacist 05/04/2023,9:48 AM

## 2023-05-04 NOTE — Progress Notes (Signed)
   05/03/23 2015  Psych Admission Type (Psych Patients Only)  Admission Status Voluntary  Psychosocial Assessment  Patient Complaints Other (Comment);Anxiety;Depression (fatigue)  Eye Contact Fair  Facial Expression Worried  Affect Anxious  Speech Soft  Interaction Guarded  Motor Activity Slow  Appearance/Hygiene In scrubs  Behavior Characteristics Cooperative;Appropriate to situation  Mood Depressed;Anxious;Pleasant  Thought Process  Coherency WDL  Content WDL  Delusions None reported or observed  Perception WDL  Hallucination None reported or observed  Judgment Limited  Confusion None  Danger to Self  Current suicidal ideation? Denies  Danger to Others  Danger to Others None reported or observed   Progress note   D: Pt seen in dayroom. Pt denies SI, HI, AVH. Pt rates pain  0/10. Pt rates anxiety  moderate and depression  moderate. Concerned about the hot flashes she is experiencing. Also states she is fatigued during the day. Encouraged pt to  relay this information to provider. If her medication is causing fatigue, time of administration could be changed. Pt encouraged to reach out to her GYN about solutions for hot flashes that won't interfere with her other medications. Pt denies side effects of Lexapro. Still concerned about the real reason behind her symptoms since mid-summer. Says all tests so far have been inconclusive but she still feels the same way. No other concerns noted at this time.  A: Pt provided support and encouragement. Pt given scheduled medication as prescribed. PRNs as appropriate. Q15 min checks for safety.   R: Pt safe on the unit. Will continue to monitor.

## 2023-05-04 NOTE — Group Note (Unsigned)
Date:  05/04/2023 Time:  2:12 PM  Group Topic/Focus:  Goals Group:   The focus of this group is to help patients establish daily goals to achieve during treatment and discuss how the patient can incorporate goal setting into their daily lives to aide in recovery.     Participation Level:  {BHH PARTICIPATION EXBMW:41324}  Participation Quality:  {BHH PARTICIPATION QUALITY:22265}  Affect:  {BHH AFFECT:22266}  Cognitive:  {BHH COGNITIVE:22267}  Insight: {BHH Insight2:20797}  Engagement in Group:  {BHH ENGAGEMENT IN MWNUU:72536}  Modes of Intervention:  {BHH MODES OF INTERVENTION:22269}  Additional Comments:  ***  Emma Lopez 05/04/2023, 2:12 PM

## 2023-05-04 NOTE — Plan of Care (Signed)
  Problem: Education: Goal: Emotional status will improve Outcome: Progressing Goal: Mental status will improve Outcome: Progressing Goal: Verbalization of understanding the information provided will improve Outcome: Progressing   Problem: Activity: Goal: Interest or engagement in activities will improve Outcome: Progressing   Problem: Coping: Goal: Ability to verbalize frustrations and anger appropriately will improve Outcome: Progressing Goal: Ability to demonstrate self-control will improve Outcome: Progressing   Problem: Health Behavior/Discharge Planning: Goal: Compliance with treatment plan for underlying cause of condition will improve Outcome: Progressing   Problem: Safety: Goal: Periods of time without injury will increase Outcome: Progressing

## 2023-05-04 NOTE — Plan of Care (Signed)
  Problem: Coping: Goal: Level of anxiety will decrease Outcome: Progressing   Problem: Pain Managment: Goal: General experience of comfort will improve Outcome: Progressing   Problem: Safety: Goal: Ability to remain free from injury will improve Outcome: Progressing   

## 2023-05-04 NOTE — Group Note (Signed)
Date:  05/04/2023 Time:  8:31 PM  Group Topic/Focus:  Making Healthy Choices:   The focus of this group is to help patients identify negative/unhealthy choices they were using prior to admission and identify positive/healthier coping strategies to replace them upon discharge.    Participation Level:  Active  Participation Quality:  Appropriate  Affect:  Appropriate  Cognitive:  Appropriate  Insight: Good  Engagement in Group:  Engaged  Modes of Intervention:  Clarification  Additional Comments:    Burt Ek 05/04/2023, 8:31 PM

## 2023-05-04 NOTE — Group Note (Signed)
Recreation Therapy Group Note   Group Topic:Communication  Group Date: 05/04/2023 Start Time: 1400 End Time: 1445 Facilitators: Rosina Lowenstein, LRT, CTRS Location: Courtyard  Group Description: Emotional Check in. Patient sat and talked with LRT about how they are doing and whatever else is on their mind. LRT provided active listening, reassurance and encouragement. Pts were given the opportunity to listen to music or play cornhole while getting fresh air and sunlight in the courtyard.    Goal Area(s) Addressed: Patient will engage in conversation with LRT. Patient will communicate their wants, needs, or questions.  Patient will practice a new coping skill of "talking to someone".    Affect/Mood: Irritable and Labile   Participation Level: Moderate   Participation Quality: Independent   Behavior: Disinterested   Speech/Thought Process: Coherent   Insight: Limited   Judgement: Limited   Modes of Intervention: Open Conversation   Patient Response to Interventions:  Receptive   Education Outcome:  In group clarification offered    Clinical Observations/Individualized Feedback: Emma Lopez was somewhat active in their participation of session activities and group discussion. Pt identified "I am not a fan of music. I like peace, quiet, and serenity. Serenity means a lot to mental health". LRT discussed how music can be used as a coping skill and is proved to be beneficial to mental health. Pt left group early and did not return.    Plan: Continue to engage patient in RT group sessions 2-3x/week.   Rosina Lowenstein, LRT, CTRS 05/04/2023 3:07 PM

## 2023-05-04 NOTE — Progress Notes (Signed)
[name]  05/04/2023  Emma Lopez  MRN:  [id number]  Emma Lopez is a 77 year old white female who was voluntarily admitted to inpatient psychiatry for depression, anxiety, and failure to thrive. She has lost about 20 pounds.   Subjective:   Chart reviewed, case discussed with staff in multidisciplinary meeting today, patient seen during rounds.  Patient reports she feels little better except for  a bout of diarrhea this morning.  Patient thinks she ate oatmeal for breakfast which caused this.  Patient was prescribed loperamide to help with diarrhea.  We discussed patient's discharge plan.  Patient reports that her cousin is able to pick the patient up upon discharge Saturday.  Patient had questions regarding hot flashes.  Patient was encouraged to work with gynecologist and an endocrinologist.  Patient reports that she has borderline hyperthyroidism.  Patient was encouraged to follow-up with endocrinologist for management of hyperthyroidism.  She denies thoughts of harming herself, no intention or plan.  She agrees to talk to staff in case she is overwhelmed with suicidal thoughts or depression.  Patient denies manic or psychotic symptoms.  Patient was encouraged to attend group and work on coping strategies and a safe discharge plan.   Principal Problem: Major depressive disorder, recurrent severe without psychotic features (HCC) Diagnosis: Principal Problem:   Major depressive disorder, recurrent severe without psychotic features [facility])   Past Psychiatric History: Patient reports history of depression. Currently going to outpatient at [facility]   Past Medical History:  Past Medical History:  Diagnosis Date   Abnormal EKG    Anxiety    Arthritis    oa   Bradycardia 12/14/2017   CIN III (cervical intraepithelial neoplasia III) 2000   Complication of anesthesia    did well last 2 times with procedures   Depression    DES exposure in utero    DVT (deep venous thrombosis) (HCC)  01/2009   LEFT LEG   DVT (deep venous thrombosis) (HCC) 10/30/2020   Dyspnea on exertion 10/2017   Elevated triglycerides with high cholesterol    Endometriosis    Fibromyalgia    GERD (gastroesophageal reflux disease)    History of colon polyps    History of hiatal hernia    told by some md has, some say not   IBS (irritable bowel syndrome) 2015   Left bundle branch block    intermittent   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  low TSH 0.22 on 03/24/23.   Vitamin D deficiency     Past Surgical History:  Procedure Laterality Date   ABDOMINAL HYSTERECTOMY  2001   TAH, partial   APPENDECTOMY  1978   COLONOSCOPY WITH PROPOFOL N/A 12/07/2015   Procedure: COLONOSCOPY WITH PROPOFOL;  Surgeon: Charolett Bumpers, MD;  Location: [facility]COPY;  Service: Endoscopy;  Laterality: N/A;   COLONOSCOPY WITH PROPOFOL N/A 01/11/2022   Procedure: COLONOSCOPY WITH PROPOFOL;  Surgeon: Kerin Salen, MD;  Location: [facility];  Service: Gastroenterology;  Laterality: N/A;   colonscopy  7 yrs ago   other in past   ESOPHAGOGASTRODUODENOSCOPY (EGD) WITH PROPOFOL  N/A 12/07/2015   Procedure: ESOPHAGOGASTRODUODENOSCOPY (EGD) WITH PROPOFOL;  Surgeon: Charolett Bumpers, MD;  Location: [facility];  Service: Endoscopy;  Laterality: N/A;   ESOPHAGOGASTRODUODENOSCOPY ENDOSCOPY  yrs ago   FACIAL COSMETIC SURGERY     GYNECOLOGIC CRYOSURGERY     HEMOSTASIS CLIP PLACEMENT  01/11/2022   Procedure: HEMOSTASIS CLIP PLACEMENT;  Surgeon: Kerin Salen, MD;  Location: [facility];  Service: Gastroenterology;;   HOT HEMOSTASIS N/A 01/11/2022   Procedure: HOT HEMOSTASIS (ARGON  PLASMA COAGULATION/BICAP);  Surgeon: Kerin Salen, MD;  Location: [facility];  Service: Gastroenterology;  Laterality: N/A;   MYOMECTOMY     POLYPECTOMY  01/11/2022   Procedure: POLYPECTOMY;  Surgeon: Kerin Salen, MD;  Location: [facility];  Service: Gastroenterology;;   REPLACEMENT TOTAL JOINT WRIST W/ PROSTHETIC IMPLANT Right 08/01/2021   TONSILLECTOMY     Family History:  Family History  Problem Relation Age of Onset   Depression Mother    Anxiety disorder Mother    Hypertension Mother    Breast cancer Mother        37's   Cancer Father        COLON   Depression Sister    Anxiety disorder Sister    Hypertension Sister    Stroke Sister    Breast cancer Maternal Aunt        Age 60's   Cancer Maternal Aunt        Melanoma   Alzheimer's disease Maternal Aunt    Breast cancer Maternal Aunt        70's   Cancer Maternal Aunt        Colon CA   Alzheimer's disease Maternal Aunt    Cancer Paternal Aunt        OVARIAN and COLON    Social History:  Social History   Substance and Sexual Activity  Alcohol Use Yes   Comment: 1 drink per month     Social History   Substance and Sexual Activity  Drug Use No    Social History   Socioeconomic History   Marital status: Single    Spouse name: Not on file   Number of children: 0   Years of education: 16   Highest education level: Bachelor's degree (e.g., BA, AB, BS)  Occupational History   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  Tobacco Use   Smoking status: Former    Current packs/day: 0.00    Average packs/day: 0.1 packs/day for 6.0 years (0.6 ttl pk-yrs)    Types: Cigarettes    Start date: 08/01/1972    Quit date: 08/01/1978    Years since quitting: 44.7   Smokeless tobacco: Never   Tobacco comments:    only smoked 4-6 yrs 1 pack per month  Vaping Use   Vaping status: Never Used  Substance and Sexual Activity   Alcohol use: Yes    Comment: 1 drink per month   Drug use: No   Sexual activity: Not  Currently    Birth control/protection: Surgical    Comment: HYSTERECTOMY-1st intercourse 27-Fewer than 5 partners  Other Topics Concern   Not on file  Social History Narrative   Lives alone in [place].    Social Determinants of Health   Financial Resource Strain: Not on file  Food Insecurity: Food Insecurity Present (04/29/2023)   Hunger Vital Sign    Worried About Running Out of Food in the Last Year: Not on file    Ran Out of Food in the Last Year: Sometimes true  Transportation Needs: No Transportation Needs (04/29/2023)   PRAPARE - Administrator, Civil Service (Medical): No    Lack of Transportation (Non-Medical): No  Physical Activity: Not on file  Stress: Not on file  Social Connections: Not on file   Additional Social History:     Patient reports that she would like to consider option of going to an assisted living facility.                    Sleep: Fair  Appetite:  Fair  Current Medications: Current Facility-Administered Medications  Medication Dose Route Frequency Provider Last Rate Last Admin   acetaminophen (TYLENOL) tablet 650 mg  650 mg Oral Q6H PRN Dahlia Byes C, NP   650 mg at 04/30/23 1751   ALPRAZolam (XANAX) tablet 0.25 mg  0.25 mg Oral Q8H PRN Dahlia Byes C, NP   0.25 mg at 04/30/23 0713   alum & mag hydroxide-simeth (MAALOX/MYLANTA) 200-200-20 MG/5ML suspension 30 mL  30 mL Oral Q4H PRN Dahlia Byes C, NP       ARIPiprazole (ABILIFY) tablet 2 mg  2 mg Oral Daily Lewanda Rife, MD   2 mg at 05/04/23 0919   escitalopram (LEXAPRO) tablet 20 mg  20 mg Oral Daily Lewanda Rife, MD   20 mg at 05/04/23 0919   feeding supplement (ENSURE ENLIVE / ENSURE PLUS) liquid 237 mL  237 mL Oral BID BM Onuoha, Josephine C, NP   237 mL at 05/04/23 1446   lip balm (BLISTEX) ointment   Topical PRN Dahlia Byes C, NP       loperamide (IMODIUM) capsule 2 mg  2 mg Oral PRN Lewanda Rife, MD   2 mg at 05/04/23 1144    methocarbamol (ROBAXIN) tablet 250 mg  250 mg Oral Daily PRN Dahlia Byes C, NP       ondansetron (ZOFRAN-ODT) disintegrating tablet 8 mg  8 mg Oral Q8H PRN Onuoha, Josephine C, NP       oxyCODONE (Oxy IR/ROXICODONE) immediate release tablet 5 mg  5 mg Oral Q8H PRN Dahlia Byes C, NP       rOPINIRole (REQUIP) tablet 1 mg  1 mg Oral QHS Dahlia Byes C, NP   1 mg at 05/03/23 2152   Warfarin - Pharmacist Dosing Inpatient   Does not apply q1600 Earney Navy, NP   Given at 04/30/23 1614    Lab Results:  Results for orders placed or performed during the hospital encounter of 04/29/23 (from the past 48 hour(s))  Protime-INR     Status: Abnormal   Collection Time: 05/03/23  7:00 AM  Result Value Ref Range   Prothrombin Time 23.8 (H) 11.4 - 15.2 seconds   INR 2.1 (H) 0.8 - 1.2    Comment: (NOTE) INR goal varies based on device and disease states. Performed at [facility], [address] [place], [place] [address]   Protime-INR     Status: Abnormal   Collection Time: 05/04/23  7:09 AM  Result Value Ref Range   Prothrombin Time 24.5 (H) 11.4 - 15.2 seconds   INR 2.2 (H) 0.8 - 1.2    Comment: (NOTE) INR goal varies based on device and disease states. Performed at [facility], [address] [place], [place] [address]   CBC     Status: None   Collection Time: 05/04/23  7:09 AM  Result Value Ref Range   WBC 8.3 4.0 - 10.5 K/uL   RBC 4.50 3.87 -  5.11 MIL/uL   Hemoglobin 14.1 12.0 - 15.0 g/dL   HCT 95.6 21.3 - 08.6 %   MCV 94.7 80.0 - 100.0 fL   MCH 31.3 26.0 - 34.0 pg   MCHC 33.1 30.0 - 36.0 g/dL   RDW 57.8 46.9 - 62.9 %   Platelets 274 150 - 400 K/uL   nRBC 0.0 0.0 - 0.2 %    Comment: Performed at [facility], [address] [place], [place] [address]  Hemoglobin A1c     Status: None   Collection Time: 05/04/23  7:09 AM  Result Value Ref Range   Hgb A1c MFr Bld 5.6 4.8 - 5.6 %    Comment: (NOTE) Pre diabetes:           5.7%-6.4%  Diabetes:              >6.4%  Glycemic control for   <7.0% adults with diabetes    Mean Plasma Glucose 114.02 mg/dL    Comment: Performed at [facility], [address], [place], [place] [address]  Lipid panel     Status: None   Collection Time: 05/04/23  7:09 AM  Result Value Ref Range   Cholesterol 138 0 - 200 mg/dL   Triglycerides 57 <102 mg/dL   HDL 61 >72 mg/dL   Total CHOL/HDL Ratio 2.3 RATIO   VLDL 11 0 - 40 mg/dL   LDL Cholesterol 66 0 - 99 mg/dL    Comment:        Total Cholesterol/HDL:CHD Risk Coronary Heart Disease Risk Table                     Men   Women  1/2 Average Risk   3.4   3.3  Average Risk       5.0   4.4  2 X Average Risk   9.6   7.1  3 X Average Risk  23.4   11.0        Use the calculated Patient Ratio above and the CHD Risk Table to determine the patient's CHD Risk.        ATP III CLASSIFICATION (LDL):  <100     mg/dL   Optimal  536-644  mg/dL   Near or Above                    Optimal  130-159  mg/dL   Borderline  034-742  mg/dL   High  >595     mg/dL   Very High Performed at [facility], [address] [place], [place] [address]     Blood Alcohol level:  Lab Results  Component Value Date   [facility] <10 04/26/2023         Musculoskeletal: Strength & Muscle Tone: within normal limits Gait & Station: normal Patient leans: N/A  Psychiatric Specialty Exam:  Presentation  General Appearance:  Appropriate for Environment  Eye Contact: Good  Speech: Clear and Coherent  Speech Volume: Normal  Handedness: Right   Mood and Affect  Mood: Better  Affect: Congruent Less constricted  Thought Process  Thought Processes: Coherent  Descriptions of Associations:Intact  Orientation:Full (Time, Place and Person)  Thought Content: Positive suicidal thoughts  History of Schizophrenia/Schizoaffective disorder:No Duration of Psychotic Symptoms:NA  Hallucinations: Denies Ideas of  Reference:None  Suicidal Thoughts: Denies suicidal thoughts today Homicidal Thoughts: Denies  Sensorium  Memory: Immediate Fair; Recent Fair  Judgment: Improving  Insight: Chief Executive Officer  Concentration: Fair  Attention Span: Fair  Recall: Good  Fund of Knowledge: Good  Language: Good   Psychomotor Activity  Psychomotor Activity: Normal Assets  Assets: Communication Skills; Desire for Improvement; Social Support; Housing; Transportation   Sleep  Sleep: Fair  Physical Exam: Physical Exam Constitutional:      Appearance: Normal appearance.  HENT:     Head: Normocephalic and atraumatic.     Nose: Nose normal.     Mouth/Throat:     Mouth: Mucous membranes are moist.  Eyes:     Pupils: Pupils are equal, round, and reactive to light.  Cardiovascular:     Rate and Rhythm: Normal rate.     Pulses: Normal pulses.  Pulmonary:     Effort: Pulmonary effort is normal.  Skin:    General: Skin is warm.  Neurological:     General: No focal deficit present.     Mental Status: She is alert and oriented to person, place, and time.    Review of Systems  Constitutional:  Negative for chills and fever.  HENT:  Negative for congestion, hearing loss and sore throat.   Eyes:  Negative for blurred vision and double vision.  Respiratory:  Negative for cough and shortness of breath.   Cardiovascular:  Negative for chest pain and palpitations.  Gastrointestinal:  Negative for nausea and vomiting.  Neurological:  Negative for dizziness and focal weakness.   Blood pressure (!) 123/50, pulse 61, temperature 97.9 F (36.6 C), resp. rate 18, height 5' (1.524 m), weight 51.3 kg, SpO2 97%. Body mass index is 22.07 kg/m.   Treatment Plan Summary: Daily contact with patient to assess and evaluate symptoms and progress in treatment and Medication management  Patient has been admitted to locked unit under safety precautions Depression: Increased the dose of  Lexapro to 20 mg by mouth daily.  Starting 05/04/23 Continue Abilify 2 mg by mouth daily to augment the antidepressant effect of Lexapro Social worker consulted to get collateral and help with a safe discharge plan.  Patient is considering to go to an assisted living facility   Lewanda Rife, MD

## 2023-05-04 NOTE — Progress Notes (Signed)
   05/04/23 0602  15 Minute Checks  Location Bedroom  Visual Appearance Calm  Behavior Sleeping  Sleep (Behavioral Health Patients Only)  Calculate sleep? (Click Yes once per 24 hr at 0600 safety check) Yes  Documented sleep last 24 hours 8.5

## 2023-05-05 DIAGNOSIS — F332 Major depressive disorder, recurrent severe without psychotic features: Secondary | ICD-10-CM | POA: Diagnosis not present

## 2023-05-05 LAB — PROTIME-INR
INR: 2.6 — ABNORMAL HIGH (ref 0.8–1.2)
Prothrombin Time: 27.7 s — ABNORMAL HIGH (ref 11.4–15.2)

## 2023-05-05 MED ORDER — WARFARIN SODIUM 1 MG PO TABS
1.0000 mg | ORAL_TABLET | Freq: Once | ORAL | Status: AC
  Administered 2023-05-05: 1 mg via ORAL
  Filled 2023-05-05: qty 1

## 2023-05-05 NOTE — Progress Notes (Signed)
[facility] MD Progress Note  05/05/2023  Emma Lopez  MRN:  [id number]  Emma Lopez is a 77 year old white female who was voluntarily admitted to inpatient psychiatry for depression, anxiety, and failure to thrive. She has lost about 20 pounds.   Subjective:   Chart reviewed, case discussed with staff in multidisciplinary meeting today, patient seen during rounds.  Social worker reports she has talked to patient's cousin Emma Lopez, the guardian reports that patient does not have a safe discharge plan yet.  Reportedly, Emma Lopez will come on the unit to assess patient for home care services.   Patient reports she feels fine today.  We talked about patient's fatigue and tiredness.  We discussed getting B12 and vitamin D levels.  Patient agrees with this plan.  Patient today said that she feels hopeful.  She said" I have realized that I need to go to an assisted living facility, I do not want to stay alone".  Patient reports her cousin is working on getting patient into an assisted living facility.   She denies thoughts of harming herself, no intention or plan.  She patient denies manic or psychotic symptoms.  Patient was encouraged to attend group and work on coping strategies and a safe discharge plan.   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 Currently going to outpatient at [facility]   Past Medical History:  Past Medical History:  Diagnosis Date   Abnormal EKG    Anxiety    Arthritis    oa   Bradycardia 12/14/2017   CIN III (cervical intraepithelial neoplasia III) 2000   Complication of anesthesia    did well last 2 times with procedures   Depression    DES exposure in utero    DVT (deep venous thrombosis) (HCC) 01/2009   LEFT LEG   DVT (deep venous thrombosis)  (HCC) 10/30/2020   Dyspnea on exertion 10/2017   Elevated triglycerides with high cholesterol    Endometriosis    Fibromyalgia    GERD (gastroesophageal reflux disease)    History of colon polyps    History of hiatal hernia    told by some md has, some say not   IBS (irritable bowel syndrome) 2015   Left bundle branch block    intermittent   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  low TSH 0.22 on 03/24/23.   Vitamin D deficiency     Past Surgical History:  Procedure Laterality Date   ABDOMINAL HYSTERECTOMY  2001   TAH, partial   APPENDECTOMY  1978   COLONOSCOPY WITH PROPOFOL N/A 12/07/2015   Procedure: COLONOSCOPY WITH PROPOFOL;  Surgeon: Charolett Bumpers, MD;  Location: [facility] ENDOSCOPY;  Service: Endoscopy;  Laterality: N/A;   COLONOSCOPY WITH PROPOFOL N/A 01/11/2022   Procedure: COLONOSCOPY WITH PROPOFOL;  Surgeon: Kerin Salen, MD;  Location: [facility] ENDOSCOPY;  Service: Gastroenterology;  Laterality: N/A;   colonscopy  7 yrs ago   other in past  ESOPHAGOGASTRODUODENOSCOPY (EGD) WITH PROPOFOL N/A 12/07/2015   Procedure: ESOPHAGOGASTRODUODENOSCOPY (EGD) WITH PROPOFOL;  Surgeon: Charolett Bumpers, MD;  Location: [facility];  Service: Endoscopy;  Laterality: N/A;   ESOPHAGOGASTRODUODENOSCOPY ENDOSCOPY  yrs ago   FACIAL COSMETIC SURGERY     GYNECOLOGIC CRYOSURGERY     HEMOSTASIS CLIP PLACEMENT  01/11/2022   Procedure: HEMOSTASIS CLIP PLACEMENT;  Surgeon: Kerin Salen, MD;  Location: [facility];  Service: Gastroenterology;;   HOT HEMOSTASIS N/A 01/11/2022   Procedure: HOT HEMOSTASIS (ARGON PLASMA COAGULATION/BICAP);  Surgeon: Kerin Salen, MD;   Location: [facility];  Service: Gastroenterology;  Laterality: N/A;   MYOMECTOMY     POLYPECTOMY  01/11/2022   Procedure: POLYPECTOMY;  Surgeon: Kerin Salen, MD;  Location: [facility];  Service: Gastroenterology;;   REPLACEMENT TOTAL JOINT WRIST W/ PROSTHETIC IMPLANT Right 08/01/2021   TONSILLECTOMY     Family History:  Family History  Problem Relation Age of Onset   Depression Mother    Anxiety disorder Mother    Hypertension Mother    Breast cancer Mother        62's   Cancer Father        COLON   Depression Sister    Anxiety disorder Sister    Hypertension Sister    Stroke Sister    Breast cancer Maternal Aunt        Age 11's   Cancer Maternal Aunt        Melanoma   Alzheimer's disease Maternal Aunt    Breast cancer Maternal Aunt        70's   Cancer Maternal Aunt        Colon CA   Alzheimer's disease Maternal Aunt    Cancer Paternal Aunt        OVARIAN and COLON    Social History:  Social History   Substance and Sexual Activity  Alcohol Use Yes   Comment: 1 drink per month     Social History   Substance and Sexual Activity  Drug Use No    Social History   Socioeconomic History   Marital status: Single    Spouse name: Not on file   Number of children: 0   Years of education: 16   Highest education level: Bachelor's degree (e.g., BA, AB, BS)  Occupational History   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  Tobacco Use   Smoking status: Former    Current packs/day: 0.00    Average packs/day: 0.1 packs/day for 6.0 years (0.6 ttl pk-yrs)    Types: Cigarettes    Start date: 08/01/1972    Quit date: 08/01/1978    Years since quitting: 44.7   Smokeless tobacco: Never   Tobacco comments:    only smoked 4-6 yrs 1 pack per month  Vaping Use   Vaping status: Never Used  Substance and Sexual Activity   Alcohol use: Yes    Comment: 1 drink per month   Drug use: No   Sexual activity: Not Currently    Birth control/protection: Surgical    Comment:  HYSTERECTOMY-1st intercourse 27-Fewer than 5 partners  Other Topics Concern   Not on file  Social History Narrative   Lives alone in [place].    Social Determinants of Health   Financial Resource Strain: Not on file  Food Insecurity: Food Insecurity Present (04/29/2023)   Hunger Vital Sign    Worried About Running Out of Food in the Last Year: Not on file    Ran Out of Food in the  Last Year: Sometimes true  Transportation Needs: No Transportation Needs (04/29/2023)   PRAPARE - Administrator, Civil Service (Medical): No    Lack of Transportation (Non-Medical): No  Physical Activity: Not on file  Stress: Not on file  Social Connections: Not on file   Additional Social History:     Patient reports that she would like to consider option of going to an assisted living facility.                    Sleep: Fair  Appetite:  Fair  Current Medications: Current Facility-Administered Medications  Medication Dose Route Frequency Provider Last Rate Last Admin   acetaminophen (TYLENOL) tablet 650 mg  650 mg Oral Q6H PRN Dahlia Byes C, NP   650 mg at 04/30/23 1751   ALPRAZolam (XANAX) tablet 0.25 mg  0.25 mg Oral Q8H PRN Dahlia Byes C, NP   0.25 mg at 04/30/23 0713   alum & mag hydroxide-simeth (MAALOX/MYLANTA) 200-200-20 MG/5ML suspension 30 mL  30 mL Oral Q4H PRN Dahlia Byes C, NP       ARIPiprazole (ABILIFY) tablet 2 mg  2 mg Oral Daily Lewanda Rife, MD   2 mg at 05/05/23 0918   escitalopram (LEXAPRO) tablet 20 mg  20 mg Oral Daily Lewanda Rife, MD   20 mg at 05/05/23 1610   feeding supplement (ENSURE ENLIVE / ENSURE PLUS) liquid 237 mL  237 mL Oral BID BM Onuoha, Josephine C, NP   237 mL at 05/04/23 1446   lip balm (BLISTEX) ointment   Topical PRN Dahlia Byes C, NP       loperamide (IMODIUM) capsule 2 mg  2 mg Oral PRN Lewanda Rife, MD   2 mg at 05/04/23 1144   methocarbamol (ROBAXIN) tablet 250 mg  250 mg Oral Daily PRN Dahlia Byes C, NP       ondansetron (ZOFRAN-ODT) disintegrating tablet 8 mg  8 mg Oral Q8H PRN Onuoha, Josephine C, NP       oxyCODONE (Oxy IR/ROXICODONE) immediate release tablet 5 mg  5 mg Oral Q8H PRN Dahlia Byes C, NP       rOPINIRole (REQUIP) tablet 1 mg  1 mg Oral QHS Onuoha, Josephine C, NP   1 mg at 05/04/23 2130   warfarin (COUMADIN) tablet 1 mg  1 mg Oral ONCE-1600 Lowella Bandy, [facility]       Warfarin - Pharmacist Dosing Inpatient   Does not apply q1600 Earney Navy, NP   Given at 04/30/23 1614    Lab Results:  Results for orders placed or performed during the hospital encounter of 04/29/23 (from the past 48 hour(s))  Protime-INR     Status: Abnormal   Collection Time: 05/04/23  7:09 AM  Result Value Ref Range   Prothrombin Time 24.5 (H) 11.4 - 15.2 seconds   INR 2.2 (H) 0.8 - 1.2    Comment: (NOTE) INR goal varies based on device and disease states. Performed at [facility], [address] [place], [place] [address]   CBC     Status: None   Collection Time: 05/04/23  7:09 AM  Result Value Ref Range   WBC 8.3 4.0 - 10.5 K/uL   RBC 4.50 3.87 - 5.11 MIL/uL   Hemoglobin 14.1 12.0 - 15.0 g/dL   HCT 40.9 81.1 - 91.4 %   MCV 94.7 80.0 - 100.0 fL   MCH 31.3 26.0 - 34.0 pg   MCHC 33.1 30.0 -  36.0 g/dL   RDW 16.1 09.6 - 04.5 %   Platelets 274 150 - 400 K/uL   nRBC 0.0 0.0 - 0.2 %    Comment: Performed at [facility], [address] [place], [place] [address]  Hemoglobin A1c     Status: None   Collection Time: 05/04/23  7:09 AM  Result Value Ref Range   Hgb A1c MFr Bld 5.6 4.8 - 5.6 %    Comment: (NOTE) Pre diabetes:          5.7%-6.4%  Diabetes:              >6.4%  Glycemic control for   <7.0% adults with diabetes    Mean Plasma Glucose 114.02 mg/dL    Comment: Performed at [facility], [address], [place], [place] [address]  Lipid panel     Status: None   Collection Time: 05/04/23  7:09 AM  Result Value Ref Range    Cholesterol 138 0 - 200 mg/dL   Triglycerides 57 <829 mg/dL   HDL 61 >56 mg/dL   Total CHOL/HDL Ratio 2.3 RATIO   VLDL 11 0 - 40 mg/dL   LDL Cholesterol 66 0 - 99 mg/dL    Comment:        Total Cholesterol/HDL:CHD Risk Coronary Heart Disease Risk Table                     Men   Women  1/2 Average Risk   3.4   3.3  Average Risk       5.0   4.4  2 X Average Risk   9.6   7.1  3 X Average Risk  23.4   11.0        Use the calculated Patient Ratio above and the CHD Risk Table to determine the patient's CHD Risk.        ATP III CLASSIFICATION (LDL):  <100     mg/dL   Optimal  213-086  mg/dL   Near or Above                    Optimal  130-159  mg/dL   Borderline  578-469  mg/dL   High  >629     mg/dL   Very High Performed at [facility], [address] [place], [place] [address]   Protime-INR     Status: Abnormal   Collection Time: 05/05/23  7:35 AM  Result Value Ref Range   Prothrombin Time 27.7 (H) 11.4 - 15.2 seconds   INR 2.6 (H) 0.8 - 1.2    Comment: (NOTE) INR goal varies based on device and disease states. Performed at [facility], [address] [place], [place] [address]     Blood Alcohol level:  Lab Results  Component Value Date   [facility] <10 04/26/2023         Musculoskeletal: Strength & Muscle Tone: within normal limits Gait & Station: normal Patient leans: N/A  Psychiatric Specialty Exam:  Presentation  General Appearance:  Appropriate for Environment  Eye Contact: Good  Speech: Clear and Coherent  Speech Volume: Normal  Handedness: Right   Mood and Affect  Mood: Better, patient is hopeful  Affect: Congruent More animated  Thought Process  Thought Processes: Coherent  Descriptions of Associations:Intact  Orientation:Full (Time, Place and Person)  Thought Content: Positive suicidal thoughts  History of Schizophrenia/Schizoaffective disorder:No Duration of Psychotic Symptoms:NA  Hallucinations:  Denies Ideas of Reference:None  Suicidal Thoughts: Denies suicidal thoughts , No intention or plan  Homicidal Thoughts: Denies  Sensorium  Memory: Immediate Fair; Recent Fair  Judgment: Improving  Insight: Fair   Chartered certified accountant: Fair  Attention Span: Fair  Recall: Good  Fund of Knowledge: Good  Language: Good   Psychomotor Activity  Psychomotor Activity: Normal Assets  Assets: Communication Skills; Desire for Improvement; Social Support; Housing; Transportation   Sleep  Sleep: Fair  Physical Exam: Physical Exam Constitutional:      Appearance: Normal appearance.  HENT:     Head: Normocephalic and atraumatic.     Nose: Nose normal.     Mouth/Throat:     Mouth: Mucous membranes are moist.  Eyes:     Pupils: Pupils are equal, round, and reactive to light.  Cardiovascular:     Rate and Rhythm: Normal rate.     Pulses: Normal pulses.  Pulmonary:     Effort: Pulmonary effort is normal.  Skin:    General: Skin is warm.  Neurological:     General: No focal deficit present.     Mental Status: She is alert and oriented to person, place, and time.    Review of Systems  Constitutional:  Negative for chills and fever.  HENT:  Negative for congestion, hearing loss and sore throat.   Eyes:  Negative for blurred vision and double vision.  Respiratory:  Negative for cough and shortness of breath.   Cardiovascular:  Negative for chest pain and palpitations.  Gastrointestinal:  Negative for nausea and vomiting.  Neurological:  Negative for dizziness and focal weakness.   Blood pressure (!) 140/55, pulse (!) 57, temperature 98.2 F (36.8 C), resp. rate 18, height 5' (1.524 m), weight 51.3 kg, SpO2 95%. Body mass index is 22.07 kg/m.   Treatment Plan Summary: Daily contact with patient to assess and evaluate symptoms and progress in treatment and Medication management  Patient has been admitted to locked unit under safety  precautions Depression: Increased the dose of Lexapro to 20 mg by mouth daily.  Starting 05/04/23 Continue Abilify 2 mg by mouth daily to augment the antidepressant effect of Lexapro Social worker consulted to get collateral and help with a safe discharge plan.  Patient is considering to go to an assisted living facility HbA1c 5.6, Lipid : Chol 138, LDL 66 Vit B12 and Vit D levels ordered   Lewanda Rife, MD

## 2023-05-05 NOTE — Group Note (Signed)
Recreation Therapy Group Note   Group Topic:Relaxation  Group Date: 05/05/2023 Start Time: 1300 End Time: 1350 Facilitators: Rosina Lowenstein, LRT, CTRS Location: Courtyard  Group Description: Meditation. LRT and patients discussed what they know about meditation and mindfulness. LRT played a Deep Breathing Meditation exercise script for patients to follow along to. LRT and patients discussed how meditation and deep breathing can be used as a coping skill post--discharge. After the meditation, LRT took song requests from pts to listen to while getting fresh air and sunlight.   Goal Area(s) Addressed: Patient will practice using relaxation technique. Patient will identify a new coping skill.  Patient will follow multistep directions to reduce anxiety and stress.   Affect/Mood: Flat   Participation Level: Moderate   Participation Quality: Independent   Behavior: Cooperative   Speech/Thought Process: Coherent   Insight: Fair   Judgement: Fair    Modes of Intervention: Activity   Patient Response to Interventions:  Receptive   Education Outcome:  Acknowledges education   Clinical Observations/Individualized Feedback: Emma Lopez was somewhat active in their participation of session activities and group discussion. Pt left group early after sharing: "today just isn't as pleasant outside as I had hoped". Pt did not return to group.   Plan: Continue to engage patient in RT group sessions 2-3x/week.   Rosina Lowenstein, LRT, CTRS 05/05/2023 3:10 PM

## 2023-05-05 NOTE — Group Note (Signed)
Date:  05/05/2023 Time:  11:35 AM  Group Topic/Focus:  Managing Feelings:   The focus of this group is to identify what feelings patients have difficulty handling and develop a plan to handle them in a healthier way upon discharge.    Participation Level:  Did Not Attend  Participation Quality:    Affect:    Cognitive:   Insight:   Engagement in Group:    Modes of Intervention:    Additional Comments:    Ardelle Anton 05/05/2023, 11:35 AM

## 2023-05-05 NOTE — Group Note (Signed)
Date:  05/05/2023 Time:  8:21 PM  Group Topic/Focus:  Healthy Communication:   The focus of this group is to discuss communication, barriers to communication, as well as healthy ways to communicate with others.    Participation Level:  Active  Participation Quality:  Appropriate  Affect:  Appropriate  Cognitive:  Alert and Appropriate  Insight: Appropriate and Good  Engagement in Group:  Engaged  Modes of Intervention:  Support  Additional Comments:    Alif Petrak 05/05/2023, 8:21 PM

## 2023-05-05 NOTE — Consult Note (Signed)
ANTICOAGULATION CONSULT NOTE   Pharmacy Consult for Warfarin Indication:  Hx of VTE  Patient Measurements: Height: 5' (152.4 cm) Weight: 51.3 kg (113 lb) IBW/kg (Calculated) : 45.5  Labs: Recent Labs    05/03/23 0700 05/04/23 0709 05/05/23 0735  HGB  --  14.1  --   HCT  --  42.6  --   PLT  --  274  --   LABPROT 23.8* 24.5* 27.7*  INR 2.1* 2.2* 2.6*   Estimated Creatinine Clearance: 43.6 mL/min (by C-G formula based on SCr of 0.59 mg/dL).  Medical History: Past Medical History:  Diagnosis Date   Abnormal EKG    Anxiety    Arthritis    oa   Bradycardia 12/14/2017   CIN III (cervical intraepithelial neoplasia III) 2000   Complication of anesthesia    did well last 2 times with procedures   Depression    DES exposure in utero    DVT (deep venous thrombosis) (HCC) 01/2009   LEFT LEG   DVT (deep venous thrombosis) (HCC) 10/30/2020   Dyspnea on exertion 10/2017   Elevated triglycerides with high cholesterol    Endometriosis    Fibromyalgia    GERD (gastroesophageal reflux disease)    History of colon polyps    History of hiatal hernia    told by some md has, some say not   IBS (irritable bowel syndrome) 2015   Left bundle branch block    intermittent   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  low TSH 0.22 on 03/24/23.   Vitamin D deficiency    Assessment:  77 y.o. female patient referred to lipid clinic by Eligha Bridegroom, NP. PMH is significant for recurrent DVT/PE with positive lupus anticoagulant (followed by Dr. Myna Hidalgo), HLD, sinus bradycardia, GERD, intermittent LBBB, hyperthyroidism and anxiety.  CTA with FFR  done in 2024 for chest pain. CAC 1 (29th percentile), CT FFR analysis did not show any significant stenosis. There is visual stenosis at ostial LAD, but FFR is not significant. Med rec tech spoke with patient/family and last day of warfarin unknown. Pt was admitted to [facility] on 9/26 with a supratherapeutic INR. No major DDI.   Warfarin regimen based on notes: Warfarin 1 mg daily except for 1/2 tablet on Sundays, Tuesdays, and Thursdays.   Date INR Warfarin Dose  9/28 2.6 HELD  9/29 2.3 1 mg   9/30 2.1 1 mg   10/1 2.1 1 mg  10/2 2.1 1 mg  10/3 2.2 1 mg   10/4 2.6 1 mg   Goal of Therapy:  INR 2.5 - 3.5   pt is at a high risk for VTE. Goal obtain from Heme/Onc note from 10/2022.  Monitor platelets by anticoagulation protocol: Yes   Plan:  --INR is subtherapeutic and stable.  --Will give warfarin 1 mg x1 tonight (home dose) --Daily INR.  --CBC at least every 3 days  Lowella Bandy, PharmD Clinical Pharmacist 05/05/2023,10:55 AM

## 2023-05-05 NOTE — Progress Notes (Signed)
Patient pleasant and cooperative.  Flat, sad affect.  Endorses anxiety, depression and fatigue. Denies SI/HI and AVH.  Reports she slept well.  Good appetite.  Patient requested to be weighed this morning (113 lbs).    Compliant with scheduled medications. Continues to need multiple sips of water to swallow pills. 15 min checks in place for safety.  Appropriate, but minimal interaction with peers. Not present in the milieu often - patient reports the noise is bothersome to her.

## 2023-05-05 NOTE — Progress Notes (Signed)
   05/05/23 6213  15 Minute Checks  Location Bedroom  Visual Appearance Calm  Behavior Sleeping  Sleep (Behavioral Health Patients Only)  Calculate sleep? (Click Yes once per 24 hr at 0600 safety check) Yes  Documented sleep last 24 hours 9.25

## 2023-05-05 NOTE — Plan of Care (Signed)
  Problem: Education: Goal: Knowledge of General Education information will improve Description: Including pain rating scale, medication(s)/side effects and non-pharmacologic comfort measures Outcome: Progressing   Problem: Education: Goal: Mental status will improve Outcome: Progressing   Problem: Safety: Goal: Periods of time without injury will increase Outcome: Progressing

## 2023-05-05 NOTE — Progress Notes (Signed)
   05/04/23 2300  Psych Admission Type (Psych Patients Only)  Admission Status Voluntary  Psychosocial Assessment  Patient Complaints Anxiety;Depression  Eye Contact Fair  Facial Expression Sad;Flat  Affect Sad;Flat  Speech Soft  Interaction Minimal  Motor Activity Slow  Appearance/Hygiene In scrubs  Behavior Characteristics Cooperative  Mood Depressed;Pleasant  Thought Process  Coherency WDL  Content WDL  Delusions None reported or observed  Perception WDL  Hallucination None reported or observed  Judgment Limited  Confusion None  Danger to Self  Current suicidal ideation? Denies  Agreement Not to Harm Self Yes  Description of Agreement verbal  Danger to Others  Danger to Others None reported or observed

## 2023-05-06 DIAGNOSIS — F332 Major depressive disorder, recurrent severe without psychotic features: Secondary | ICD-10-CM | POA: Diagnosis not present

## 2023-05-06 LAB — PROTIME-INR
INR: 2.6 — ABNORMAL HIGH (ref 0.8–1.2)
Prothrombin Time: 27.9 s — ABNORMAL HIGH (ref 11.4–15.2)

## 2023-05-06 LAB — VITAMIN B12: Vitamin B-12: 745 pg/mL (ref 180–914)

## 2023-05-06 MED ORDER — WARFARIN SODIUM 1 MG PO TABS
1.0000 mg | ORAL_TABLET | Freq: Once | ORAL | Status: AC
  Administered 2023-05-06: 1 mg via ORAL
  Filled 2023-05-06: qty 1

## 2023-05-06 NOTE — Consult Note (Signed)
ANTICOAGULATION CONSULT NOTE   Pharmacy Consult for Warfarin Indication:  Hx of VTE  Patient Measurements: Height: 5' (152.4 cm) Weight: 51.3 kg (113 lb) IBW/kg (Calculated) : 45.5  Labs: Recent Labs    05/04/23 0709 05/05/23 0735 05/06/23 1211  HGB 14.1  --   --   HCT 42.6  --   --   PLT 274  --   --   LABPROT 24.5* 27.7* 27.9*  INR 2.2* 2.6* 2.6*   Estimated Creatinine Clearance: 43.6 mL/min (by C-G formula based on SCr of 0.59 mg/dL).  Medical History: Past Medical History:  Diagnosis Date   Abnormal EKG    Anxiety    Arthritis    oa   Bradycardia 12/14/2017   CIN III (cervical intraepithelial neoplasia III) 2000   Complication of anesthesia    did well last 2 times with procedures   Depression    DES exposure in utero    DVT (deep venous thrombosis) (HCC) 01/2009   LEFT LEG   DVT (deep venous thrombosis) (HCC) 10/30/2020   Dyspnea on exertion 10/2017   Elevated triglycerides with high cholesterol    Endometriosis    Fibromyalgia    GERD (gastroesophageal reflux disease)    History of colon polyps    History of hiatal hernia    told by some md has, some say not   IBS (irritable bowel syndrome) 2015   Left bundle branch block    intermittent   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  low TSH 0.22 on 03/24/23.   Vitamin D deficiency    Assessment:  77 y.o. female patient referred to lipid clinic by Eligha Bridegroom, NP. PMH is significant for recurrent DVT/PE with positive lupus anticoagulant (followed by Dr. Myna Hidalgo), HLD, sinus bradycardia, GERD, intermittent LBBB, hyperthyroidism and anxiety.  CTA with FFR  done in 2024 for chest pain. CAC 1 (29th percentile), CT FFR analysis did not show any significant stenosis. There is visual stenosis at ostial LAD, but FFR is not significant. Med rec tech spoke with patient/family and last day of warfarin unknown. Pt was admitted to [facility] on 9/26 with a supratherapeutic INR. No major DDI.   Warfarin regimen based on notes: Warfarin 1 mg daily except for 1/2 tablet on Sundays, Tuesdays, and Thursdays.   DDI: ropirinole  Date INR Warfarin Dose  9/28 2.6 HELD  9/29 2.3 1 mg   9/30 2.1 1 mg   10/1 2.1 1 mg  10/2 2.1 1 mg  10/3 2.2 1 mg   10/4 2.6 1 mg  10/5 2.6            Goal of Therapy:  INR 2.5 - 3.5   pt is at a high risk for VTE. Goal obtain from Heme/Onc note from 10/2022.  Monitor platelets by anticoagulation protocol: Yes   Plan:  --INR is therapeutic and stable.  --Will give warfarin 1 mg x1 tonight (home dose) --Daily INR.  --CBC at least every 3 days  Angelique Blonder, PharmD Clinical Pharmacist 05/06/2023,12:48 PM

## 2023-05-06 NOTE — Progress Notes (Signed)
[facility] MD Progress Note  05/06/2023  Emma Lopez  MRN:  [id number]  Emma Lopez is a 77 year old white female who was voluntarily admitted to inpatient psychiatry for depression, anxiety, and failure to thrive. She has lost about 20 pounds.   Subjective:   Chart reviewed, case discussed with staff  today, patient seen during rounds.  Social worker reports she has talked to patient's cousin Huntley Dec, the guardian reports that patient does not have a safe discharge plan yet.  Reportedly, [name] will come on the unit to assess patient for home care services.   Patient is disappointed not to be able to go home today.  Patient was informed that [name] staff will come to assess the patient on the unit.  Patient reported that patient was informed the weekend staff  for [name] has been diagnosed with COVID, and they do not have any backup staff to come and assess the patient over the weekend.  The assessment probably will be done Monday.  Patient was provided with support and reassurance.  Patient has been attending groups and working on coping strategies.  She denies any intention or plan to harm herself on the unit or outside the hospital upon discharge.  Patient denies manic or psychotic symptoms   Principal Problem: Major depressive disorder, recurrent severe without psychotic features (HCC) Diagnosis: Principal Problem:   Major depressive disorder, recurrent severe without psychotic features [facility])   Past Psychiatric History: Patient reports history of depression. Currently going to outpatient at [facility]   Past Medical History:  Past Medical History:  Diagnosis Date   Abnormal EKG    Anxiety    Arthritis    oa   Bradycardia 12/14/2017   CIN III (cervical intraepithelial neoplasia III) 2000   Complication of anesthesia    did well last 2 times with procedures   Depression    DES exposure in utero    DVT (deep venous thrombosis) (HCC) 01/2009   LEFT LEG   DVT (deep  venous thrombosis) (HCC) 10/30/2020   Dyspnea on exertion 10/2017   Elevated triglycerides with high cholesterol    Endometriosis    Fibromyalgia    GERD (gastroesophageal reflux disease)    History of colon polyps    History of hiatal hernia    told by some md has, some say not   IBS (irritable bowel syndrome) 2015   Left bundle branch block    intermittent   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  low TSH 0.22 on 03/24/23.   Vitamin D deficiency     Past Surgical History:  Procedure Laterality Date   ABDOMINAL HYSTERECTOMY  2001   TAH, partial   APPENDECTOMY  1978   COLONOSCOPY WITH PROPOFOL N/A 12/07/2015   Procedure: COLONOSCOPY WITH PROPOFOL;  Surgeon: Charolett Bumpers, MD;  Location: [facility] ENDOSCOPY;  Service: Endoscopy;  Laterality: N/A;   COLONOSCOPY WITH PROPOFOL N/A 01/11/2022   Procedure: COLONOSCOPY WITH PROPOFOL;  Surgeon: Kerin Salen, MD;  Location: [facility] ENDOS[facility];  Service: Gastroenterology;  Laterality: N/A;   colonscopy  7 yrs ago  other in past   ESOPHAGOGASTRODUODENOSCOPY (EGD) WITH PROPOFOL N/A 12/07/2015   Procedure: ESOPHAGOGASTRODUODENOSCOPY (EGD) WITH PROPOFOL;  Surgeon: Charolett Bumpers, MD;  Location: [facility];  Service: Endoscopy;  Laterality: N/A;   ESOPHAGOGASTRODUODENOSCOPY ENDOSCOPY  yrs ago   FACIAL COSMETIC SURGERY     GYNECOLOGIC CRYOSURGERY     HEMOSTASIS CLIP PLACEMENT  01/11/2022   Procedure: HEMOSTASIS CLIP PLACEMENT;  Surgeon: Kerin Salen, MD;  Location: [facility];  Service: Gastroenterology;;   HOT HEMOSTASIS N/A 01/11/2022   Procedure: HOT HEMOSTASIS (ARGON PLASMA COAGULATION/BICAP);  Surgeon:  Kerin Salen, MD;  Location: [facility];  Service: Gastroenterology;  Laterality: N/A;   MYOMECTOMY     POLYPECTOMY  01/11/2022   Procedure: POLYPECTOMY;  Surgeon: Kerin Salen, MD;  Location: [facility]OS[facility];  Service: Gastroenterology;;   REPLACEMENT TOTAL JOINT WRIST W/ PROSTHETIC IMPLANT Right 08/01/2021   TONSILLECTOMY     Family History:  Family History  Problem Relation Age of Onset   Depression Mother    Anxiety disorder Mother    Hypertension Mother    Breast cancer Mother        48's   Cancer Father        COLON   Depression Sister    Anxiety disorder Sister    Hypertension Sister    Stroke Sister    Breast cancer Maternal Aunt        Age 13's   Cancer Maternal Aunt        Melanoma   Alzheimer's disease Maternal Aunt    Breast cancer Maternal Aunt        70's   Cancer Maternal Aunt        Colon CA   Alzheimer's disease Maternal Aunt    Cancer Paternal Aunt        OVARIAN and COLON    Social History:  Social History   Substance and Sexual Activity  Alcohol Use Yes   Comment: 1 drink per month     Social History   Substance and Sexual Activity  Drug Use No    Social History   Socioeconomic History   Marital status: Single    Spouse name: Not on file   Number of children: 0   Years of education: 16   Highest education level: Bachelor's degree (e.g., BA, AB, BS)  Occupational History   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  Tobacco Use   Smoking status: Former    Current packs/day: 0.00    Average packs/day: 0.1 packs/day for 6.0 years (0.6 ttl pk-yrs)    Types: Cigarettes    Start date: 08/01/1972    Quit date: 08/01/1978    Years since quitting: 44.7   Smokeless tobacco: Never   Tobacco comments:    only smoked 4-6 yrs 1 pack per month  Vaping Use   Vaping status: Never Used  Substance and Sexual Activity   Alcohol use: Yes    Comment: 1 drink per month   Drug use: No   Sexual activity: Not Currently    Birth control/protection:  Surgical    Comment: HYSTERECTOMY-1st intercourse 27-Fewer than 5 partners  Other Topics Concern   Not on file  Social History Narrative   Lives alone in [place].    Social Determinants of Health   Financial Resource Strain: Not on file  Food Insecurity: Food Insecurity Present (04/29/2023)   Hunger Vital Sign    Worried About Running Out of Food in the Last Year: Not on file    Ran  Out of Food in the Last Year: Sometimes true  Transportation Needs: No Transportation Needs (04/29/2023)   PRAPARE - Administrator, Civil Service (Medical): No    Lack of Transportation (Non-Medical): No  Physical Activity: Not on file  Stress: Not on file  Social Connections: Not on file   Additional Social History:     Patient reports that she would like to consider option of going to an assisted living facility.                    Sleep: Fair  Appetite:  Fair  Current Medications: Current Facility-Administered Medications  Medication Dose Route Frequency Provider Last Rate Last Admin   acetaminophen (TYLENOL) tablet 650 mg  650 mg Oral Q6H PRN Dahlia Byes C, NP   650 mg at 04/30/23 1751   ALPRAZolam (XANAX) tablet 0.25 mg  0.25 mg Oral Q8H PRN Dahlia Byes C, NP   0.25 mg at 04/30/23 0713   alum & mag hydroxide-simeth (MAALOX/MYLANTA) 200-200-20 MG/5ML suspension 30 mL  30 mL Oral Q4H PRN Dahlia Byes C, NP       ARIPiprazole (ABILIFY) tablet 2 mg  2 mg Oral Daily Lewanda Rife, MD   2 mg at 05/06/23 1030   escitalopram (LEXAPRO) tablet 20 mg  20 mg Oral Daily Lewanda Rife, MD   20 mg at 05/06/23 1031   feeding supplement (ENSURE ENLIVE / ENSURE PLUS) liquid 237 mL  237 mL Oral BID BM Onuoha, Josephine C, NP   237 mL at 05/06/23 1035   lip balm (BLISTEX) ointment   Topical PRN Dahlia Byes C, NP       loperamide (IMODIUM) capsule 2 mg  2 mg Oral PRN Lewanda Rife, MD   2 mg at 05/04/23 1144   methocarbamol (ROBAXIN) tablet 250 mg  250 mg  Oral Daily PRN Dahlia Byes C, NP       ondansetron (ZOFRAN-ODT) disintegrating tablet 8 mg  8 mg Oral Q8H PRN Onuoha, Josephine C, NP       oxyCODONE (Oxy IR/ROXICODONE) immediate release tablet 5 mg  5 mg Oral Q8H PRN Dahlia Byes C, NP       rOPINIRole (REQUIP) tablet 1 mg  1 mg Oral QHS Dahlia Byes C, NP   1 mg at 05/05/23 2032   Warfarin - Pharmacist Dosing Inpatient   Does not apply q1600 Earney Navy, NP   Given at 04/30/23 1614    Lab Results:  Results for orders placed or performed during the hospital encounter of 04/29/23 (from the past 48 hour(s))  Protime-INR     Status: Abnormal   Collection Time: 05/05/23  7:35 AM  Result Value Ref Range   Prothrombin Time 27.7 (H) 11.4 - 15.2 seconds   INR 2.6 (H) 0.8 - 1.2    Comment: (NOTE) INR goal varies based on device and disease states. Performed at [facility], [address] [place], [place] [address]   Protime-INR     Status: Abnormal   Collection Time: 05/06/23 12:11 PM  Result Value Ref Range   Prothrombin Time 27.9 (H) 11.4 - 15.2 seconds   INR 2.6 (H) 0.8 - 1.2    Comment: (NOTE) INR goal varies based on device and disease states. Performed at [facility], [address] [place], [place] [address]     Blood Alcohol level:  Lab Results  Component Value Date   [facility] <10 04/26/2023  Musculoskeletal: Strength & Muscle Tone: within normal limits Gait & Station: normal Patient leans: N/A  Psychiatric Specialty Exam:  Presentation  General Appearance:  Appropriate for Environment  Eye Contact: Good  Speech: Clear and Coherent  Speech Volume: Normal  Handedness: Right   Mood and Affect  Mood: Better, patient is hopeful  Affect: Congruent More animated  Thought Process  Thought Processes: Coherent  Descriptions of Associations:Intact  Orientation:Full (Time, Place and Person)  Thought Content: Positive suicidal thoughts  History of  Schizophrenia/Schizoaffective disorder:No Duration of Psychotic Symptoms:NA  Hallucinations: Denies Ideas of Reference:None  Suicidal Thoughts: Denies suicidal thoughts , No intention or plan  Homicidal Thoughts: Denies  Sensorium  Memory: Immediate Fair; Recent Fair  Judgment: Improving  Insight: Fair   Chartered certified accountant: Fair  Attention Span: Fair  Recall: Good  Fund of Knowledge: Good  Language: Good   Psychomotor Activity  Psychomotor Activity: Normal Assets  Assets: Communication Skills; Desire for Improvement; Social Support; Housing; Transportation   Sleep  Sleep: Fair  Physical Exam: Physical Exam Constitutional:      Appearance: Normal appearance.  HENT:     Head: Normocephalic and atraumatic.     Nose: Nose normal.     Mouth/Throat:     Mouth: Mucous membranes are moist.  Eyes:     Pupils: Pupils are equal, round, and reactive to light.  Cardiovascular:     Rate and Rhythm: Normal rate.     Pulses: Normal pulses.  Pulmonary:     Effort: Pulmonary effort is normal.  Skin:    General: Skin is warm.  Neurological:     General: No focal deficit present.     Mental Status: She is alert and oriented to person, place, and time.    Review of Systems  Constitutional:  Negative for chills and fever.  HENT:  Negative for congestion, hearing loss and sore throat.   Eyes:  Negative for blurred vision and double vision.  Respiratory:  Negative for cough and shortness of breath.   Cardiovascular:  Negative for chest pain and palpitations.  Gastrointestinal:  Negative for nausea and vomiting.  Neurological:  Negative for dizziness and focal weakness.   Blood pressure (!) 120/51, pulse (!) 55, temperature 98.1 F (36.7 C), resp. rate 17, height 5' (1.524 m), weight 51.3 kg, SpO2 99%. Body mass index is 22.07 kg/m.   Treatment Plan Summary: Daily contact with patient to assess and evaluate symptoms and progress in  treatment and Medication management  Patient has been admitted to locked unit under safety precautions Depression: Increased the dose of Lexapro to 20 mg by mouth daily.  Starting 05/04/23 Continue Abilify 2 mg by mouth daily to augment the antidepressant effect of Lexapro Social worker consulted to get collateral and help with a safe discharge plan.  Patient is considering to go to an assisted living facility HbA1c 5.6, Lipid : Chol 138, LDL 66 Vit B12 and Vit D levels pending   Lewanda Rife, MD

## 2023-05-06 NOTE — Progress Notes (Signed)
Patient is a voluntary admission to [facility] for MDD. Is scheduled for discharge but needs a home health interview before she leaves (with [name]?). Patient seems less depressed and was smiling today and interacting well with staff and peers. Denies SI, HI, AVH, Anxiety and Depression. Performs her own ADL's  Will continue to monitor.

## 2023-05-06 NOTE — Progress Notes (Signed)
   05/05/23 2300  Psych Admission Type (Psych Patients Only)  Admission Status Voluntary  Psychosocial Assessment  Patient Complaints Anxiety;Depression  Eye Contact Fair  Facial Expression Flat;Sad  Affect Flat  Speech Logical/coherent  Interaction Assertive  Motor Activity Slow  Appearance/Hygiene Unremarkable  Behavior Characteristics Cooperative  Mood Depressed;Sad  Thought Process  Coherency WDL  Content WDL  Delusions None reported or observed  Perception WDL  Hallucination None reported or observed  Judgment Impaired  Confusion None  Danger to Self  Current suicidal ideation? Denies  Agreement Not to Harm Self Yes  Description of Agreement Verbal  Danger to Others  Danger to Others None reported or observed

## 2023-05-06 NOTE — Progress Notes (Signed)
   05/06/23 2200  Psych Admission Type (Psych Patients Only)  Admission Status Voluntary  Psychosocial Assessment  Patient Complaints None  Eye Contact Fair  Facial Expression Animated  Affect Appropriate to circumstance  Speech Logical/coherent  Interaction Assertive  Motor Activity Slow  Appearance/Hygiene Unremarkable  Behavior Characteristics Cooperative  Mood Pleasant  Thought Process  Coherency WDL  Content WDL  Delusions None reported or observed  Perception WDL  Hallucination None reported or observed  Judgment Impaired  Confusion None  Danger to Self  Current suicidal ideation? Denies  Agreement Not to Harm Self Yes  Description of Agreement verbal  Danger to Others  Danger to Others None reported or observed

## 2023-05-06 NOTE — BH IP Treatment Plan (Signed)
Interdisciplinary Treatment and Diagnostic Plan Update  05/06/2023 Time of Session: 10:04 am AMORI COOPERMAN MRN: [id number]  Principal Diagnosis: Major depressive disorder, recurrent severe without psychotic features (HCC)  Secondary Diagnoses: Principal Problem:   Major depressive disorder, recurrent severe without psychotic features (HCC)   Current Medications:  Current Facility-Administered Medications  Medication Dose Route Frequency Provider Last Rate Last Admin   acetaminophen (TYLENOL) tablet 650 mg  650 mg Oral Q6H PRN Dahlia Byes C, NP   650 mg at 04/30/23 1751   ALPRAZolam (XANAX) tablet 0.25 mg  0.25 mg Oral Q8H PRN Dahlia Byes C, NP   0.25 mg at 04/30/23 0713   alum & mag hydroxide-simeth (MAALOX/MYLANTA) 200-200-20 MG/5ML suspension 30 mL  30 mL Oral Q4H PRN Dahlia Byes C, NP       ARIPiprazole (ABILIFY) tablet 2 mg  2 mg Oral Daily Lewanda Rife, MD   2 mg at 05/05/23 0918   escitalopram (LEXAPRO) tablet 20 mg  20 mg Oral Daily Lewanda Rife, MD   20 mg at 05/05/23 4098   feeding supplement (ENSURE ENLIVE / ENSURE PLUS) liquid 237 mL  237 mL Oral BID BM Onuoha, Josephine C, NP   237 mL at 05/04/23 1446   lip balm (BLISTEX) ointment   Topical PRN Dahlia Byes C, NP       loperamide (IMODIUM) capsule 2 mg  2 mg Oral PRN Lewanda Rife, MD   2 mg at 05/04/23 1144   methocarbamol (ROBAXIN) tablet 250 mg  250 mg Oral Daily PRN Dahlia Byes C, NP       ondansetron (ZOFRAN-ODT) disintegrating tablet 8 mg  8 mg Oral Q8H PRN Onuoha, Josephine C, NP       oxyCODONE (Oxy IR/ROXICODONE) immediate release tablet 5 mg  5 mg Oral Q8H PRN Dahlia Byes C, NP       rOPINIRole (REQUIP) tablet 1 mg  1 mg Oral QHS Onuoha, Josephine C, NP   1 mg at 05/05/23 2032   Warfarin - Pharmacist Dosing Inpatient   Does not apply q1600 Earney Navy, NP   Given at 04/30/23 1614   PTA Medications: Facility-Administered Medications Prior to Admission   Medication Dose Route Frequency Provider Last Rate Last Admin   denosumab (PROLIA) injection 60 mg  60 mg Subcutaneous Once Patton Salles, MD       Medications Prior to Admission  Medication Sig Dispense Refill Last Dose   ALPRAZolam (XANAX) 0.5 MG tablet Take 0.5 mg by mouth 2 (two) times daily as needed for anxiety.   prn at unk   Evolocumab (REPATHA SURECLICK) 140 MG/ML SOAJ Inject 140 mg into the skin every 14 (fourteen) days. 2 mL 11 Past Week   hydrOXYzine (ATARAX) 10 MG tablet TAKE 1 TABLET BY MOUTH THREE TIMES A DAY AS NEEDED 270 tablet 0 prn at unk   ondansetron (ZOFRAN-ODT) 8 MG disintegrating tablet Take 1 tablet (8 mg total) by mouth every 8 (eight) hours as needed for nausea. 20 tablet 0 prn at unk   oxyCODONE (ROXICODONE) 5 MG immediate release tablet Take 1 tablet (5 mg total) by mouth every 8 (eight) hours as needed for up to 6 doses for breakthrough pain. 6 tablet 0 prn at unk   rOPINIRole (REQUIP) 1 MG tablet Take 1 tablet (1 mg total) by mouth at bedtime. 90 tablet 2 Past Week   sertraline (ZOLOFT) 50 MG tablet Take 1/2 tablet by mouth for 7 days, then take one whole tablet  daily (Patient taking differently: Take 50 mg by mouth See admin instructions.) 30 tablet 1 04/28/2023   Vilazodone HCl 20 MG TABS Take one tablet by mouth daily (Patient taking differently: 10 mg by Other route See admin instructions. Take 10 mg by mouth once a day through 04/29/2023) 30 tablet 1 Past Week   warfarin (COUMADIN) 1 MG tablet Take 1/2 tablet to 1 tablet by mouth daily or as directed by Anticoagulation Clinic. (Patient taking differently: Take 0.5-1 mg by mouth See admin instructions. Take 0.5 mg by mouth in the morning on Sun/Tues/Thurs and 1 mg on Mon/Wed/Fri/Sat) 90 tablet 1 Past Week    Patient Stressors: Health problems    Patient Strengths: Average or above average intelligence  Communication skills  Supportive family/friends   Treatment Modalities: Medication Management,  Group therapy, Case management,  1 to 1 session with clinician, Psychoeducation, Recreational therapy.   Physician Treatment Plan for Primary Diagnosis: Major depressive disorder, recurrent severe without psychotic features (HCC) Long Term Goal(s): Improvement in symptoms so as ready for discharge   Short Term Goals: Ability to identify changes in lifestyle to reduce recurrence of condition will improve Ability to verbalize feelings will improve Ability to disclose and discuss suicidal ideas Ability to demonstrate self-control will improve Ability to identify and develop effective coping behaviors will improve Ability to maintain clinical measurements within normal limits will improve Compliance with prescribed medications will improve Ability to identify triggers associated with substance abuse/mental health issues will improve  Medication Management: Evaluate patient's response, side effects, and tolerance of medication regimen.  Therapeutic Interventions: 1 to 1 sessions, Unit Group sessions and Medication administration.  Evaluation of Outcomes: Progressing  Physician Treatment Plan for Secondary Diagnosis: Principal Problem:   Major depressive disorder, recurrent severe without psychotic features (HCC)  Long Term Goal(s): Improvement in symptoms so as ready for discharge   Short Term Goals: Ability to identify changes in lifestyle to reduce recurrence of condition will improve Ability to verbalize feelings will improve Ability to disclose and discuss suicidal ideas Ability to demonstrate self-control will improve Ability to identify and develop effective coping behaviors will improve Ability to maintain clinical measurements within normal limits will improve Compliance with prescribed medications will improve Ability to identify triggers associated with substance abuse/mental health issues will improve     Medication Management: Evaluate patient's response, side effects, and  tolerance of medication regimen.  Therapeutic Interventions: 1 to 1 sessions, Unit Group sessions and Medication administration.  Evaluation of Outcomes: Progressing   RN Treatment Plan for Primary Diagnosis: Major depressive disorder, recurrent severe without psychotic features (HCC) Long Term Goal(s): Knowledge of disease and therapeutic regimen to maintain health will improve  Short Term Goals: Ability to verbalize feelings will improve  Medication Management: RN will administer medications as ordered by provider, will assess and evaluate patient's response and provide education to patient for prescribed medication. RN will report any adverse and/or side effects to prescribing provider.  Therapeutic Interventions: 1 on 1 counseling sessions, Psychoeducation, Medication administration, Evaluate responses to treatment, Monitor vital signs and CBGs as ordered, Perform/monitor CIWA, COWS, AIMS and Fall Risk screenings as ordered, Perform wound care treatments as ordered.  Evaluation of Outcomes: Progressing   LCSW Treatment Plan for Primary Diagnosis: Major depressive disorder, recurrent severe without psychotic features (HCC) Long Term Goal(s): Safe transition to appropriate next level of care at discharge, Engage patient in therapeutic group addressing interpersonal concerns.  Short Term Goals: Engage patient in aftercare planning with referrals and resources, Increase  social support, Increase ability to appropriately verbalize feelings, Increase emotional regulation, Facilitate acceptance of mental health diagnosis and concerns, Identify triggers associated with mental health/substance abuse issues, and Increase skills for wellness and recovery  Therapeutic Interventions: Assess for all discharge needs, 1 to 1 time with Social worker, Explore available resources and support systems, Assess for adequacy in community support network, Educate family and significant other(s) on suicide  prevention, Complete Psychosocial Assessment, Interpersonal group therapy.  Evaluation of Outcomes: Progressing   Progress in Treatment: Attending groups: Yes. Participating in groups: Yes. Taking medication as prescribed: Yes. Toleration medication: Yes. Family/Significant other contact made: No, will contact:  Vivien Rossetti, cousin [phone] Patient understands diagnosis: Yes. Discussing patient identified problems/goals with staff: Yes. Medical problems stabilized or resolved: Yes. Denies suicidal/homicidal ideation: Yes. Issues/concerns per patient self-inventory: No. Other:  None  New problem(s) identified: No, Describe:  None  New Short Term/Long Term Goal(s):  elimination of symptoms of psychosis, medication management for mood stabilization; elimination of SI thoughts; development of comprehensive mental wellness plan. Update: 05/06/2023 No updates at this time.   Patient Goals:  "To get out of here" Update: 05/06/2023 No updates at this time.   Discharge Plan or Barriers: CSW will assist with appropriate discharge planning Update: 05/06/2023 Pt awaiting home health interview prior to d/c.   Reason for Continuation of Hospitalization: Depression Medication stabilization   Estimated Length of Stay: 1 to 7 days Update: 05/06/2023 No updates at this time.  Last 3 [place] Suicide Severity Risk Score: Flowsheet Row Admission (Current) from 04/29/2023 in [facility] from 04/26/2023 in [facility] at [facility] from 04/20/2023 in [facility] Emergency Department at [facility]  C-SSRS RISK CATEGORY No Risk No Risk No Risk       Last PHQ 2/9 Scores:    02/01/2023   10:00 AM  Depression screen PHQ 2/9  Decreased Interest   Down, Depressed, Hopeless   PHQ - 2 Score   Altered sleeping   Tired, decreased energy   Change in appetite   Feeling bad or failure about yourself    Trouble concentrating   Moving  slowly or fidgety/restless   Suicidal thoughts   PHQ-9 Score      Information is confidential and restricted. Go to Review Flowsheets to unlock data.    Scribe for Treatment Team: Whitney Post, Theresia Majors 05/06/2023 10:04 AM

## 2023-05-07 DIAGNOSIS — F332 Major depressive disorder, recurrent severe without psychotic features: Secondary | ICD-10-CM | POA: Diagnosis not present

## 2023-05-07 LAB — PROTIME-INR
INR: 2.7 — ABNORMAL HIGH (ref 0.8–1.2)
Prothrombin Time: 28.8 s — ABNORMAL HIGH (ref 11.4–15.2)

## 2023-05-07 MED ORDER — VITAMIN D (ERGOCALCIFEROL) 1.25 MG (50000 UNIT) PO CAPS
50000.0000 [IU] | ORAL_CAPSULE | ORAL | Status: DC
  Administered 2023-05-07: 50000 [IU] via ORAL
  Filled 2023-05-07: qty 1

## 2023-05-07 MED ORDER — WARFARIN SODIUM 1 MG PO TABS
1.0000 mg | ORAL_TABLET | Freq: Once | ORAL | Status: AC
  Administered 2023-05-07: 1 mg via ORAL
  Filled 2023-05-07: qty 1

## 2023-05-07 NOTE — Consult Note (Signed)
ANTICOAGULATION CONSULT NOTE   Pharmacy Consult for Warfarin Indication:  Hx of VTE  Patient Measurements: Height: 5' (152.4 cm) Weight: 51.3 kg (113 lb) IBW/kg (Calculated) : 45.5  Labs: Recent Labs    05/05/23 0735 05/06/23 1211 05/07/23 0730  LABPROT 27.7* 27.9* 28.8*  INR 2.6* 2.6* 2.7*   Estimated Creatinine Clearance: 43.6 mL/min (by C-G formula based on SCr of 0.59 mg/dL).  Medical History: Past Medical History:  Diagnosis Date   Abnormal EKG    Anxiety    Arthritis    oa   Bradycardia 12/14/2017   CIN III (cervical intraepithelial neoplasia III) 2000   Complication of anesthesia    did well last 2 times with procedures   Depression    DES exposure in utero    DVT (deep venous thrombosis) (HCC) 01/2009   LEFT LEG   DVT (deep venous thrombosis) (HCC) 10/30/2020   Dyspnea on exertion 10/2017   Elevated triglycerides with high cholesterol    Endometriosis    Fibromyalgia    GERD (gastroesophageal reflux disease)    History of colon polyps    History of hiatal hernia    told by some md has, some say not   IBS (irritable bowel syndrome) 2015   Left bundle branch block    intermittent   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  low TSH 0.22 on 03/24/23.   Vitamin D deficiency    Assessment:  77 y.o. female patient referred to lipid clinic by Eligha Bridegroom, NP. PMH is significant for recurrent DVT/PE with positive lupus anticoagulant (followed by Dr. Myna Hidalgo), HLD, sinus bradycardia, GERD, intermittent LBBB, hyperthyroidism and anxiety.  CTA with FFR done in 2024 for chest pain. CAC 1 (29th percentile), CT FFR  analysis did not show any significant stenosis. There is visual stenosis at ostial LAD, but FFR is not significant. Med rec tech spoke with patient/family and last day of warfarin unknown. Pt was [facility] with a supratherapeutic INR. No major DDI.   Warfarin regimen based on notes: Warfarin 1 mg daily except for 1/2 tablet on Sundays, Tuesdays, and Thursdays.   DDI: ropirinole  Date INR Warfarin Dose  9/28 2.6 HELD  9/29 2.3 1 mg   9/30 2.1 1 mg   10/1 2.1 1 mg  10/2 2.1 1 mg  10/3 2.2 1 mg   10/4 2.6 1 mg  10/5 2.6 1 mg  10/6 2.7        Goal of Therapy:  INR 2.5 - 3.5   pt is at a high risk for VTE. Goal obtain from Heme/Onc note from 10/2022.  Monitor platelets by anticoagulation protocol: Yes   Plan:  --INR is therapeutic and stable.  --Will give warfarin 1 mg x1 again tonight  --Daily INR.  --CBC at least every 3 days  Angelique Blonder, PharmD Clinical Pharmacist 05/07/2023,9:54 AM

## 2023-05-07 NOTE — Progress Notes (Signed)
Patient is a voluntary admission to [facility] for [facility]. Patient is scheduled to have an interview with [name] tomorrow for home health care. Patient is calm, cooperative, friendly with staff and peers. Has joined in group activities and looking forward to discharge. Denies SI, HI, AVH, Anxiety and endorses depression at 3/10.  Will continue to monitor.

## 2023-05-07 NOTE — Progress Notes (Signed)
[facility] MD Progress Note  05/07/2023  Emma Lopez  MRN:  [id number]  Emma is a 77 year old white Lopez who was voluntarily admitted to inpatient psychiatry for depression, anxiety, and failure to thrive. She has lost about 20 pounds.   Subjective:   Chart reviewed, case discussed with staff  today, patient seen during rounds.  Social worker reports she has talked to patient's cousin Emma Lopez, the guardian reports that patient does not have a safe discharge plan yet.  Reportedly, Emma Lopez will come on the unit to assess patient for home care services.   Patient is is doing fine today.  No new acute events reported overnight.  Patient's lab results including vitamin D discussed with patient.  Patient agrees to take vitamin D weekly dose.  Patient's B12 is within normal limits.  Patient is working on a safe discharge plan.  Patient said" I am thinking what to do in my life moving forward".  Patient was provided with support and reassurance.  Patient has been attending groups and working on coping strategies.  She denies suicidal ideations, she denies any intention or plan to harm herself.  Patient denies manic or psychotic symptoms   Principal Problem: Major depressive disorder, recurrent severe without psychotic features (HCC) Diagnosis: Principal Problem:   Major depressive disorder, recurrent severe without psychotic features [facility])   Past Psychiatric History: Patient reports history of depression. Currently going to outpatient at [facility]   Past Medical History:  Past Medical History:  Diagnosis Date   Abnormal EKG    Anxiety    Arthritis    oa   Bradycardia 12/14/2017   CIN III (cervical intraepithelial neoplasia III) 2000   Complication of anesthesia    did well last 2 times with procedures   Depression    DES exposure in utero    DVT (deep venous thrombosis) (HCC) 01/2009   LEFT LEG   DVT (deep venous thrombosis) (HCC) 10/30/2020   Dyspnea on exertion 10/2017   Elevated  triglycerides with high cholesterol    Endometriosis    Fibromyalgia    GERD (gastroesophageal reflux disease)    History of colon polyps    History of hiatal hernia    told by some md has, some say not   IBS (irritable bowel syndrome) 2015   Left bundle branch block    intermittent   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  low TSH 0.22 on 03/24/23.   Vitamin D deficiency     Past Surgical History:  Procedure Laterality Date   ABDOMINAL HYSTERECTOMY  2001   TAH, partial   APPENDECTOMY  1978   COLONOSCOPY WITH PROPOFOL N/A 12/07/2015   Procedure: COLONOSCOPY WITH PROPOFOL;  Surgeon: Charolett Bumpers, MD;  Location: [facility];  Service: Endoscopy;  Laterality: N/A;   COLONOSCOPY WITH PROPOFOL N/A 01/11/2022   Procedure: COLONOSCOPY WITH PROPOFOL;  Surgeon: Kerin Salen, MD;  Location: [facility];  Service: Gastroenterology;  Laterality: N/A;   colonscopy  7 yrs ago   other in past   ESOPHAGOGASTRODUODENOSCOPY (EGD) WITH PROPOFOL N/A 12/07/2015   Procedure: ESOPHAGOGASTRODUODENOSCOPY (  EGD) WITH PROPOFOL;  Surgeon: Charolett Bumpers, MD;  Location: [facility];  Service: Endoscopy;  Laterality: N/A;   ESOPHAGOGASTRODUODENOSCOPY ENDOSCOPY  yrs ago   FACIAL COSMETIC SURGERY     GYNECOLOGIC CRYOSURGERY     HEMOSTASIS CLIP PLACEMENT  01/11/2022   Procedure: HEMOSTASIS CLIP PLACEMENT;  Surgeon: Kerin Salen, MD;  Location: [facility];  Service: Gastroenterology;;   HOT HEMOSTASIS N/A 01/11/2022   Procedure: HOT HEMOSTASIS (ARGON PLASMA COAGULATION/BICAP);  Surgeon: Kerin Salen, MD;  Location: [facility];  Service: Gastroenterology;   Laterality: N/A;   MYOMECTOMY     POLYPECTOMY  01/11/2022   Procedure: POLYPECTOMY;  Surgeon: Kerin Salen, MD;  Location: [facility];  Service: Gastroenterology;;   REPLACEMENT TOTAL JOINT WRIST W/ PROSTHETIC IMPLANT Right 08/01/2021   TONSILLECTOMY     Family History:  Family History  Problem Relation Age of Onset   Depression Mother    Anxiety disorder Mother    Hypertension Mother    Breast cancer Mother        52's   Cancer Father        COLON   Depression Sister    Anxiety disorder Sister    Hypertension Sister    Stroke Sister    Breast cancer Maternal Aunt        Age 4's   Cancer Maternal Aunt        Melanoma   Alzheimer's disease Maternal Aunt    Breast cancer Maternal Aunt        70's   Cancer Maternal Aunt        Colon CA   Alzheimer's disease Maternal Aunt    Cancer Paternal Aunt        OVARIAN and COLON    Social History:  Social History   Substance and Sexual Activity  Alcohol Use Yes   Comment: 1 drink per month     Social History   Substance and Sexual Activity  Drug Use No    Social History   Socioeconomic History   Marital status: Single    Spouse name: Not on file   Number of children: 0   Years of education: 16   Highest education level: Bachelor's degree (e.g., BA, AB, BS)  Occupational History   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  Tobacco Use   Smoking status: Former    Current packs/day: 0.00    Average packs/day: 0.1 packs/day for 6.0 years (0.6 ttl pk-yrs)    Types: Cigarettes    Start date: 08/01/1972    Quit date: 08/01/1978    Years since quitting: 44.7   Smokeless tobacco: Never   Tobacco comments:    only smoked 4-6 yrs 1 pack per month  Vaping Use   Vaping status: Never Used  Substance and Sexual Activity   Alcohol use: Yes    Comment: 1 drink per month   Drug use: No   Sexual activity: Not Currently    Birth control/protection: Surgical    Comment: HYSTERECTOMY-1st intercourse 27-Fewer than 5 partners   Other Topics Concern   Not on file  Social History Narrative   Lives alone in [place].    Social Determinants of Health   Financial Resource Strain: Not on file  Food Insecurity: Food Insecurity Present (04/29/2023)   Hunger Vital Sign    Worried About Running Out of Food in the Last Year: Not on file    Ran Out of Food in the Last Year: Sometimes true  Transportation Needs: No Transportation Needs (  04/29/2023)   PRAPARE - Administrator, Civil Service (Medical): No    Lack of Transportation (Non-Medical): No  Physical Activity: Not on file  Stress: Not on file  Social Connections: Not on file   Additional Social History:     Patient reports that she would like to consider option of going to an assisted living facility.                    Sleep: Fair  Appetite:  Fair  Current Medications: Current Facility-Administered Medications  Medication Dose Route Frequency Provider Last Rate Last Admin   acetaminophen (TYLENOL) tablet 650 mg  650 mg Oral Q6H PRN Dahlia Byes C, NP   650 mg at 04/30/23 1751   ALPRAZolam (XANAX) tablet 0.25 mg  0.25 mg Oral Q8H PRN Dahlia Byes C, NP   0.25 mg at 04/30/23 0713   alum & mag hydroxide-simeth (MAALOX/MYLANTA) 200-200-20 MG/5ML suspension 30 mL  30 mL Oral Q4H PRN Dahlia Byes C, NP       ARIPiprazole (ABILIFY) tablet 2 mg  2 mg Oral Daily Lewanda Rife, MD   2 mg at 05/07/23 1003   escitalopram (LEXAPRO) tablet 20 mg  20 mg Oral Daily Lewanda Rife, MD   20 mg at 05/07/23 1003   feeding supplement (ENSURE ENLIVE / ENSURE PLUS) liquid 237 mL  237 mL Oral BID BM Onuoha, Josephine C, NP   237 mL at 05/07/23 1005   lip balm (BLISTEX) ointment   Topical PRN Dahlia Byes C, NP       loperamide (IMODIUM) capsule 2 mg  2 mg Oral PRN Lewanda Rife, MD   2 mg at 05/04/23 1144   methocarbamol (ROBAXIN) tablet 250 mg  250 mg Oral Daily PRN Dahlia Byes C, NP       ondansetron (ZOFRAN-ODT)  disintegrating tablet 8 mg  8 mg Oral Q8H PRN Onuoha, Josephine C, NP       oxyCODONE (Oxy IR/ROXICODONE) immediate release tablet 5 mg  5 mg Oral Q8H PRN Dahlia Byes C, NP       rOPINIRole (REQUIP) tablet 1 mg  1 mg Oral QHS Onuoha, Josephine C, NP   1 mg at 05/06/23 2031   Vitamin D (Ergocalciferol) (DRISDOL) 1.25 MG (50000 UNIT) capsule 50,000 Units  50,000 Units Oral Q7 days Lewanda Rife, MD       warfarin (COUMADIN) tablet 1 mg  1 mg Oral ONCE-1600 Angelique Blonder, [facility]       Warfarin - Pharmacist Dosing Inpatient   Does not apply q1600 Earney Navy, NP   Given at 05/06/23 1637    Lab Results:  Results for orders placed or performed during the hospital encounter of 04/29/23 (from the past 48 hour(s))  Protime-INR     Status: Abnormal   Collection Time: 05/06/23 12:11 PM  Result Value Ref Range   Prothrombin Time 27.9 (H) 11.4 - 15.2 seconds   INR 2.6 (H) 0.8 - 1.2    Comment: (NOTE) INR goal varies based on device and disease states. Performed at [facility], [address] [place], [place] [address]   Vitamin B12     Status: None   Collection Time: 05/06/23 12:13 PM  Result Value Ref Range   Vitamin B-12 745 180 - 914 pg/mL    Comment: (NOTE) This assay is not validated for testing neonatal or myeloproliferative syndrome specimens for Vitamin B12 levels. Performed at [facility], [address], [place], [place] [address]   Protime-INR     Status: Abnormal   Collection Time: 05/07/23  7:30 AM  Result Value Ref Range   Prothrombin Time 28.8 (H) 11.4 - 15.2 seconds   INR 2.7 (H) 0.8 - 1.2    Comment: (NOTE) INR goal varies based on device and disease states. Performed at [facility], [address] [place], [place] [address]     Blood Alcohol level:  Lab Results  Component Value Date   [facility] <10 04/26/2023         Musculoskeletal: Strength & Muscle Tone: within normal limits Gait & Station: normal Patient  leans: N/A  Psychiatric Specialty Exam:  Presentation  General Appearance:  Appropriate for Environment  Eye Contact: Good  Speech: Clear and Coherent  Speech Volume: Normal  Handedness: Right   Mood and Affect  Mood: Better, patient is hopeful  Affect: Congruent More animated  Thought Process  Thought Processes: Coherent  Descriptions of Associations:Intact  Orientation:Full (Time, Place and Person)  Thought Content: Positive suicidal thoughts  History of Schizophrenia/Schizoaffective disorder:No Duration of Psychotic Symptoms:NA  Hallucinations: Denies Ideas of Reference:None  Suicidal Thoughts: Denies suicidal thoughts , No intention or plan  Homicidal Thoughts: Denies  Sensorium  Memory: Immediate Fair; Recent Fair  Judgment: Improving  Insight: Fair   Chartered certified accountant: Fair  Attention Span: Fair  Recall: Good  Fund of Knowledge: Good  Language: Good   Psychomotor Activity  Psychomotor Activity: Normal Assets  Assets: Communication Skills; Desire for Improvement; Social Support; Housing; Transportation   Sleep  Sleep: Fair  Physical Exam: Physical Exam Constitutional:      Appearance: Normal appearance.  HENT:     Head: Normocephalic and atraumatic.     Nose: Nose normal.     Mouth/Throat:     Mouth: Mucous membranes are moist.  Eyes:     Pupils: Pupils are equal, round, and reactive to light.  Cardiovascular:     Rate and Rhythm: Normal rate.     Pulses: Normal pulses.  Pulmonary:     Effort: Pulmonary effort is normal.  Skin:    General: Skin is warm.  Neurological:     General: No focal deficit present.     Mental Status: She is alert and oriented to person, place, and time.    Review of Systems  Constitutional:  Negative for chills and fever.  HENT:  Negative for congestion, hearing loss and sore throat.   Eyes:  Negative for blurred vision and double vision.  Respiratory:   Negative for cough and shortness of breath.   Cardiovascular:  Negative for chest pain and palpitations.  Gastrointestinal:  Negative for nausea and vomiting.  Neurological:  Negative for dizziness and focal weakness.   Blood pressure (!) 135/46, pulse 71, temperature 98.2 F (36.8 C), resp. rate 16, height 5' (1.524 m), weight 51.3 kg, SpO2 96%. Body mass index is 22.07 kg/m.   Treatment Plan Summary: Daily contact with patient to assess and evaluate symptoms and progress in treatment and Medication management  Patient has been admitted to locked unit under safety precautions Depression: Increased the dose of Lexapro to 20 mg by mouth daily.  Starting 05/04/23 Continue Abilify 2 mg by mouth daily to augment the antidepressant effect of Lexapro Social worker consulted to get collateral and help with a safe discharge plan.  Patient is considering to go to an assisted living facility HbA1c 5.6, Lipid : Chol 138, LDL 66 Vit B12 745,  and Vit D 37  7. Will start vitamin D 50,000 unit every 7 days   Lewanda Rife, MD

## 2023-05-07 NOTE — Group Note (Signed)
Date:  05/07/2023 Time:  10:13 AM  Group Topic/Focus:  Wellness Toolbox:   The focus of this group is to discuss various aspects of wellness, balancing those aspects and exploring ways to increase the ability to experience wellness.  Patients will create a wellness toolbox for use upon discharge.    Participation Level:  Did Not Attend  Participation Quality:    Affect:    Cognitive:    Insight:   Engagement in Group:    Modes of Intervention:    Additional Comments:    Emma Lopez 05/07/2023, 10:13 AM

## 2023-05-07 NOTE — Progress Notes (Signed)
   05/07/23 2015  Psych Admission Type (Psych Patients Only)  Admission Status Voluntary  Psychosocial Assessment  Patient Complaints Anxiety;Depression  Eye Contact Fair  Facial Expression Anxious  Affect Appropriate to circumstance  Speech Logical/coherent  Interaction Assertive  Motor Activity Slow  Appearance/Hygiene Unremarkable  Behavior Characteristics Cooperative;Anxious  Mood Pleasant  Thought Process  Coherency WDL  Content WDL  Delusions None reported or observed  Perception WDL  Hallucination None reported or observed  Judgment WDL  Confusion None  Danger to Self  Current suicidal ideation? Denies  Agreement Not to Harm Self Yes  Description of Agreement verbal  Danger to Others  Danger to Others None reported or observed   Progress note   D: Pt seen at nurse's station. Pt denies SI, HI, AVH. Pt rates pain  0/10. Pt rates anxiety  3/10 and depression  5/10. States that she is being discharged tomorrow. Worried about what will happen after discharge and what the outcome will be. Her cousin is her POA. Pt given information about her B12 level. Relayed to pt that level is WNL. Pt states she is still experiencing fatigue during the day. Says her RLS starts bothering her when she tries to lay down and take a nap in the afternoon. Encouraged pt to try and prop herself up on her pillow instead of lying completely flat. Pt considering moving to a facility. "I looked into all of this for my sister before she died." Pt looking towards future needs. No other concerns noted at this time.  A: Pt provided support and encouragement. Pt given scheduled medication as prescribed. PRNs as appropriate. Q15 min checks for safety.   R: Pt safe on the unit. Will continue to monitor.

## 2023-05-07 NOTE — Group Note (Signed)
Date:  05/07/2023 Time:  2:44 PM  Group Topic/Focus:  Wellness Toolbox:   The focus of this group is to discuss various aspects of wellness, balancing those aspects and exploring ways to increase the ability to experience wellness.  Patients will create a wellness toolbox for use upon discharge.    Participation Level:  Did Not Attend  Participation Quality:    Affect:    Cognitive:    Insight:   Engagement in Group:    Modes of Intervention:    Additional Comments:    Emma Lopez 05/07/2023, 2:44 PM

## 2023-05-07 NOTE — Group Note (Signed)
Date:  05/07/2023 Time:  10:35 PM  Group Topic/Focus:  Making Healthy Choices:   The focus of this group is to help patients identify negative/unhealthy choices they were using prior to admission and identify positive/healthier coping strategies to replace them upon discharge.    Participation Level:  Active  Participation Quality:  Appropriate  Affect:  Appropriate  Cognitive:  Appropriate  Insight: Appropriate  Engagement in Group:  Engaged  Modes of Intervention:  Discussion  Additional Comments:    Maeola Harman 05/07/2023, 10:35 PM

## 2023-05-07 NOTE — Group Note (Signed)
Date:  05/07/2023 Time:  6:41 AM  Group Topic/Focus:  Identifying Needs:   The focus of this group is to help patients identify their personal needs that have been historically problematic and identify healthy behaviors to address their needs.    Participation Level:  Active  Participation Quality:  Appropriate  Affect:  Appropriate  Cognitive:  Appropriate  Insight: Appropriate  Engagement in Group:  Engaged  Modes of Intervention:  Education  Additional Comments:    Garry Heater 05/07/2023, 6:41 AM

## 2023-05-07 NOTE — Progress Notes (Signed)
   05/07/23 0600  15 Minute Checks  Location Bedroom  Visual Appearance Calm  Behavior Sleeping  Sleep (Behavioral Health Patients Only)  Calculate sleep? (Click Yes once per 24 hr at 0600 safety check) Yes  Documented sleep last 24 hours 9.25

## 2023-05-07 NOTE — Group Note (Signed)
LCSW Group Therapy Note  Group Date: 05/07/2023 Start Time: 1300 End Time: 1330   Type of Therapy and Topic:  Group Therapy - Coping Skills  Participation Level:  Active   Description of Group The focus of this group was to determine what unhealthy coping techniques typically are used by group members and what healthy coping techniques would be helpful in coping with various problems. Patients were guided in becoming aware of the differences between healthy and unhealthy coping techniques. Patients were asked to identify 2-3 healthy coping skills they would like to learn to use more effectively.  Therapeutic Goals Patients learned that coping is what human beings do all day long to deal with various situations in their lives Patients defined and discussed healthy vs unhealthy coping techniques Patients identified their preferred coping techniques and identified whether these were healthy or unhealthy Patients determined 2-3 healthy coping skills they would like to become more familiar with and use more often. Patients provided support and ideas to each other   Summary of Patient Progress:  The patient attended group. Patient proved open to input from peers and feedback from [facility]. Patient demonstrated  insight into the subject matter, was respectful of peers, and participated throughout the entire session. The patient participated during icebreaker question. The patient stated that she has tried yoga. The patient stated that she take deep breaths.     Marshell Levan, LCSWA 05/07/2023  2:52 PM

## 2023-05-08 ENCOUNTER — Ambulatory Visit: Payer: PPO

## 2023-05-08 DIAGNOSIS — F332 Major depressive disorder, recurrent severe without psychotic features: Secondary | ICD-10-CM | POA: Diagnosis not present

## 2023-05-08 LAB — CBC
HCT: 38.8 % (ref 36.0–46.0)
Hemoglobin: 13.1 g/dL (ref 12.0–15.0)
MCH: 31.3 pg (ref 26.0–34.0)
MCHC: 33.8 g/dL (ref 30.0–36.0)
MCV: 92.8 fL (ref 80.0–100.0)
Platelets: 264 10*3/uL (ref 150–400)
RBC: 4.18 MIL/uL (ref 3.87–5.11)
RDW: 13.6 % (ref 11.5–15.5)
WBC: 8.7 10*3/uL (ref 4.0–10.5)
nRBC: 0 % (ref 0.0–0.2)

## 2023-05-08 LAB — PROTIME-INR
INR: 3 — ABNORMAL HIGH (ref 0.8–1.2)
Prothrombin Time: 31.4 s — ABNORMAL HIGH (ref 11.4–15.2)

## 2023-05-08 MED ORDER — VITAMIN D (ERGOCALCIFEROL) 1.25 MG (50000 UNIT) PO CAPS: 50000.0000 [IU] | ORAL_CAPSULE | ORAL | 0 refills | Status: DC

## 2023-05-08 MED ORDER — WARFARIN SODIUM 1 MG PO TABS
1.0000 mg | ORAL_TABLET | ORAL | Status: DC
  Filled 2023-05-08: qty 1

## 2023-05-08 MED ORDER — ARIPIPRAZOLE 2 MG PO TABS: 2.0000 mg | ORAL_TABLET | Freq: Every day | ORAL | 0 refills | Status: DC

## 2023-05-08 MED ORDER — WARFARIN 0.5 MG HALF TABLET: 0.5000 mg | ORAL_TABLET | ORAL | Status: DC

## 2023-05-08 MED ORDER — ESCITALOPRAM OXALATE 20 MG PO TABS: 20.0000 mg | ORAL_TABLET | Freq: Every day | ORAL | 0 refills | Status: DC

## 2023-05-08 NOTE — Discharge Summary (Signed)
Physician Discharge Summary Note  Patient:  Emma Lopez is an 76 y.o., female MRN:  [id number] DOB:  [birth year] Patient phone:  [phone] (home)  Patient address:   [address] [place] [address],   Date of Admission:  04/29/2023 Date of Discharge: 05/08/2023  Reason for Admission:  Emma Lopez is a 77 year old white female who was voluntarily admitted to inpatient psychiatry for depression, anxiety, and failure to thrive. She has lost about 20 pounds   Principal Problem: Major depressive disorder, recurrent severe without psychotic features (HCC) Discharge Diagnoses: Principal Problem:   Major depressive disorder, recurrent severe without psychotic features (HCC)   Past Psychiatric History: H/o Depression and anxiety, Currently going to outpatient at [facility]   Past Medical History:  Past Medical History:  Diagnosis Date   Abnormal EKG    Anxiety    Arthritis    oa   Bradycardia 12/14/2017   CIN III (cervical intraepithelial neoplasia III) 2000   Complication of anesthesia    did well last 2 times with procedures   Depression    DES exposure in utero    DVT (deep venous thrombosis) (HCC) 01/2009   LEFT LEG   DVT (deep venous thrombosis) (HCC) 10/30/2020   Dyspnea on exertion 10/2017   Elevated triglycerides with high cholesterol    Endometriosis    Fibromyalgia    GERD (gastroesophageal reflux disease)    History of colon polyps    History of hiatal hernia    told by some md has, some say not   IBS (irritable bowel syndrome) 2015   Left bundle branch block    intermittent   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   low TSH 0.22 on 03/24/23.   Vitamin D deficiency     Past Surgical History:  Procedure Laterality Date   ABDOMINAL HYSTERECTOMY  2001   TAH, partial   APPENDECTOMY  1978   COLONOSCOPY WITH PROPOFOL N/A 12/07/2015   Procedure: COLONOSCOPY WITH PROPOFOL;  Surgeon: Charolett Bumpers, MD;  Location: [facility];  Service: Endoscopy;  Laterality: N/A;   COLONOSCOPY WITH PROPOFOL N/A 01/11/2022   Procedure: COLONOSCOPY WITH PROPOFOL;  Surgeon: Kerin Salen, MD;  Location: [facility];  Service: Gastroenterology;  Laterality: N/A;   colonscopy  7 yrs ago   other in past   ESOPHAGOGASTRODUODENOSCOPY (EGD) WITH PROPOFOL N/A 12/07/2015   Procedure: ESOPHAGOGASTRODUODENOSCOPY (EGD) WITH PROPOFOL;  Surgeon: Charolett Bumpers, MD;  Location: [facility];  Service: Endoscopy;  Laterality: N/A;   ESOPHAGOGASTRODUODENOSCOPY ENDOSCOPY  yrs ago   FACIAL COSMETIC SURGERY     GYNECOLOGIC CRYOSURGERY     HEMOSTASIS CLIP PLACEMENT  01/11/2022   Procedure: HEMOSTASIS CLIP PLACEMENT;  Surgeon: Kerin Salen, MD;  Location: [facility];  Service: Gastroenterology;;   HOT HEMOSTASIS N/A 01/11/2022   Procedure: HOT HEMOSTASIS (ARGON PLASMA COAGULATION/BICAP);  Surgeon: Kerin Salen, MD;  Location: [facility];  Service: Gastroenterology;  Laterality: N/A;   MYOMECTOMY     POLYPECTOMY  01/11/2022   Procedure: POLYPECTOMY;  Surgeon: Kerin Salen, MD;  Location: [facility];  Service: Gastroenterology;;   REPLACEMENT TOTAL  JOINT WRIST W/ PROSTHETIC IMPLANT Right 08/01/2021   TONSILLECTOMY     Family History:  Family History  Problem Relation Age of Onset   Depression Mother    Anxiety disorder Mother    Hypertension Mother    Breast cancer Mother        50's   Cancer Father        COLON   Depression Sister    Anxiety disorder Sister    Hypertension Sister    Stroke Sister    Breast cancer Maternal Aunt        Age 68's   Cancer Maternal Aunt        Melanoma   Alzheimer's disease Maternal Aunt    Breast  cancer Maternal Aunt        70's   Cancer Maternal Aunt        Colon CA   Alzheimer's disease Maternal Aunt    Cancer Paternal Aunt        OVARIAN and COLON    Social History:  Social History   Substance and Sexual Activity  Alcohol Use Yes   Comment: 1 drink per month     Social History   Substance and Sexual Activity  Drug Use No    Social History   Socioeconomic History   Marital status: Single    Spouse name: Not on file   Number of children: 0   Years of education: 16   Highest education level: Bachelor's degree (e.g., BA, AB, BS)  Occupational History   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  Tobacco Use   Smoking status: Former    Current packs/day: 0.00    Average packs/day: 0.1 packs/day for 6.0 years (0.6 ttl pk-yrs)    Types: Cigarettes    Start date: 08/01/1972    Quit date: 08/01/1978    Years since quitting: 44.7   Smokeless tobacco: Never   Tobacco comments:    only smoked 4-6 yrs 1 pack per month  Vaping Use   Vaping status: Never Used  Substance and Sexual Activity   Alcohol use: Yes    Comment: 1 drink per month   Drug use: No   Sexual activity: Not Currently    Birth control/protection: Surgical    Comment: HYSTERECTOMY-1st intercourse 27-Fewer than 5 partners  Other Topics Concern   Not on file  Social History Narrative   Lives alone in [place].    Social Determinants of Health   Financial Resource Strain: Not on file  Food Insecurity: Food Insecurity Present (04/29/2023)   Hunger Vital Sign    Worried About Running Out of Food in the Last Year: Not on file    Ran Out of Food in the Last Year: Sometimes true  Transportation Needs: No Transportation Needs (04/29/2023)   PRAPARE - Administrator, Civil Service (Medical): No    Lack of Transportation (Non-Medical): No  Physical Activity: Not on file  Stress: Not on file  Social Connections: Not on file    Hospital Course:  The patient was admitted to Inpatient  psychiatric treatment for stabilization of depression ans SI. Patient was placed on suicidal precautions. The patient was evaluated and treated by the multidisciplinary treatment team including physicians, nurses, social workers and therapists. All medications were presented to the patient and the Patient gave consent to all the medications that they were given, as well as was explained the risks, benefits, side effects and alternatives of all medication therapies. The patient was integrated  into the general milieu on the ward and encouraged to attend to her ADLs and participate in all groups and activities. During hospital course the Patient attended coping skill groups, music therapy and activity therapy groups. Patient was counseled on cognitive techniques/skills by multiple staff members and given support care by the staff.  Patient's medication regimen was evaluated and titrated to therapeutic levels to better Patient's overall daily functioning. Specifically, the patient was started on Lexapro and Abilify.Abilify was given to augment the antidepressant  effect of Lexapro.  She  tolerated the medication well with no significant side effects.  Patient's vitamin D was on the lower side, patient was given 50,000 unit of vitamin D to be repeated every 7 days.  During the hospitalization, the patient demonstrated a stabilization of depression with improved sleep and improved appetite. At the time of discharge, the patient denied any suicidal ideation/homicidal ideation and was not overtly depressed, manic or psychotic. The Patient was interacting well in groups and on the unit with their peers. Patient was able to identify a safety plan to include speaking with family, contacting outpatient provider or calling 911 if hallucinations/delusions returned or worsened or thoughts of self-harm or suicide return. Patient was counselled on outpatient follow-up that was arranged prior to  discharge.  Musculoskeletal: Strength & Muscle Tone: within normal limits Gait & Station: normal Patient leans: N/A   Psychiatric Specialty Exam:   Presentation  General Appearance:  Appropriate for Environment   Eye Contact: Good   Speech: Clear and Coherent   Speech Volume: Normal   Handedness: Right     Mood and Affect  Mood: Better, patient is hopeful   Affect: Congruent More animated   Thought Process  Thought Processes: Coherent   Descriptions of Associations:Intact   Orientation:Full (Time, Place and Person)   Thought Content: Positive suicidal thoughts   History of Schizophrenia/Schizoaffective disorder:No Duration of Psychotic Symptoms:NA   Hallucinations: Denies Ideas of Reference:None   Suicidal Thoughts: Denies suicidal thoughts , No intention or plan   Homicidal Thoughts: Denies   Sensorium  Memory: Immediate Fair; Recent Fair   Judgment: Improving   Insight: Fair     Chartered certified accountant: Fair   Attention Span: Fair   Recall: Good   Fund of Knowledge: Good   Language: Good     Psychomotor Activity  Psychomotor Activity: Normal Assets  Assets: Communication Skills; Desire for Improvement; Social Support; Housing; Transportation     Sleep  Sleep: Fair   Physical Exam: Physical Exam Constitutional:      Appearance: Normal appearance.  HENT:     Head: Normocephalic and atraumatic.     Nose: Nose normal.     Mouth/Throat:     Mouth: Mucous membranes are moist.  Eyes:     Pupils: Pupils are equal, round, and reactive to light.  Cardiovascular:     Rate and Rhythm: Normal rate.     Pulses: Normal pulses.  Pulmonary:     Effort: Pulmonary effort is normal.  Skin:    General: Skin is warm.  Neurological:     General: No focal deficit present.     Mental Status: She is alert and oriented to person, place, and time.      Review of Systems  Constitutional:  Negative for chills and  fever.  HENT:  Negative for congestion, hearing loss and sore throat.   Eyes:  Negative for blurred vision and double vision.  Respiratory:  Negative for cough and shortness of breath.  Cardiovascular:  Negative for chest pain and palpitations.  Gastrointestinal:  Negative for nausea and vomiting.   Blood pressure (!) 116/43, pulse (!) 56, temperature 98.3 F (36.8 C), resp. rate 14, height 5' (1.524 m), weight 51.3 kg, SpO2 99%. Body mass index is 22.07 kg/m.   Social History   Tobacco Use  Smoking Status Former   Current packs/day: 0.00   Average packs/day: 0.1 packs/day for 6.0 years (0.6 ttl pk-yrs)   Types: Cigarettes   Start date: 08/01/1972   Quit date: 08/01/1978   Years since quitting: 44.7  Smokeless Tobacco Never  Tobacco Comments   only smoked 4-6 yrs 1 pack per month   Tobacco Cessation:  N/A, patient does not currently use tobacco products   Blood Alcohol level:  Lab Results  Component Value Date   ETH <10 04/26/2023    Metabolic Disorder Labs:  Lab Results  Component Value Date   HGBA1C 5.6 05/04/2023   MPG 114.02 05/04/2023   No results found for: "PROLACTIN" Lab Results  Component Value Date   CHOL 138 05/04/2023   TRIG 57 05/04/2023   HDL 61 05/04/2023   CHOLHDL 2.3 05/04/2023   VLDL 11 05/04/2023   LDLCALC 66 05/04/2023    See Psychiatric Specialty Exam and Suicide Risk Assessment completed by Attending Physician prior to discharge.  Discharge destination:  Home  Is patient on multiple antipsychotic therapies at discharge:  No    Recommended Plan for Multiple Antipsychotic Therapies: NA   Allergies as of 05/08/2023       Reactions   Hydroxyzine Other (See Comments)   Vertigo   Sulfa Antibiotics Rash        Medication List     STOP taking these medications    ALPRAZolam 0.5 MG tablet Commonly known as: XANAX   hydrOXYzine 10 MG tablet Commonly known as: ATARAX   ondansetron 8 MG disintegrating tablet Commonly known as:  ZOFRAN-ODT   sertraline 50 MG tablet Commonly known as: Zoloft   Vilazodone HCl 20 MG Tabs       TAKE these medications      Indication  ARIPiprazole 2 MG tablet Commonly known as: ABILIFY Take 1 tablet (2 mg total) by mouth daily. Start taking on: May 09, 2023    escitalopram 20 MG tablet Commonly known as: LEXAPRO Take 1 tablet (20 mg total) by mouth daily. Start taking on: May 09, 2023    oxyCODONE 5 MG immediate release tablet Commonly known as: Roxicodone Take 1 tablet (5 mg total) by mouth every 8 (eight) hours as needed for up to 6 doses for breakthrough pain.    Repatha SureClick 140 MG/ML Soaj Generic drug: Evolocumab Inject 140 mg into the skin every 14 (fourteen) days.    rOPINIRole 1 MG tablet Commonly known as: REQUIP Take 1 tablet (1 mg total) by mouth at bedtime.  Indication: Restless Leg Syndrome   Vitamin D (Ergocalciferol) 1.25 MG (50000 UNIT) Caps capsule Commonly known as: DRISDOL Take 1 capsule (50,000 Units total) by mouth every 7 (seven) days. Start taking on: May 14, 2023    warfarin 1 MG tablet Commonly known as: COUMADIN Take as directed. If you are unsure how to take this medication, talk to your nurse or doctor. Original instructions: Take 1/2 tablet to 1 tablet by mouth daily or as directed by Anticoagulation Clinic. What changed:  how much to take how to take this when to take this additional instructions         PATIENTS  CONDITION AT DISCHARGE:  Stable  TOBACCO CESSATION SCREENING  Patient was screened and counselled on smoking cessation at time of discharge.   PRESCRIPTION ARE LOCATED:  On Chart  DISCHARGE INSTRUCTIONS:  1. Diet: Cardiac  2. Activity: As tolerated  3. Take medications as prescribed and not to make any changes without first consulting with the outpatient provider.  4. Patient was advised to avoid any illicit drugs or alcohol due to negative impact on physical and mental health.  5.  Patient should keep all follow up appointments.  TIME SPENT ON DISCHARGE: Over 35 minutes were spent on this patients discharge including a face to face encounter, patient counseling and preparation of discharge materials.   Signed: Lewanda Rife, MD

## 2023-05-08 NOTE — Progress Notes (Signed)
   05/08/23 0557  15 Minute Checks  Location Bedroom  Visual Appearance Calm  Behavior Sleeping  Sleep (Behavioral Health Patients Only)  Calculate sleep? (Click Yes once per 24 hr at 0600 safety check) Yes  Documented sleep last 24 hours 8.5

## 2023-05-08 NOTE — BHH Suicide Risk Assessment (Signed)
[facility] Discharge Suicide Risk Assessment   Principal Problem: Major depressive disorder, recurrent severe without psychotic features (HCC) Discharge Diagnoses: Principal Problem:   Major depressive disorder, recurrent severe without psychotic features (HCC)   Musculoskeletal: Strength & Muscle Tone: within normal limits Gait & Station: normal Patient leans: N/A   Psychiatric Specialty Exam:   Presentation  General Appearance:  Appropriate for Environment   Eye Contact: Good   Speech: Clear and Coherent   Speech Volume: Normal   Handedness: Right     Mood and Affect  Mood: Better, patient is hopeful   Affect: Congruent More animated   Thought Process  Thought Processes: Coherent   Descriptions of Associations:Intact   Orientation:Full (Time, Place and Person)   Thought Content: Positive suicidal thoughts   History of Schizophrenia/Schizoaffective disorder:No Duration of Psychotic Symptoms:NA   Hallucinations: Denies Ideas of Reference:None   Suicidal Thoughts: Denies suicidal thoughts , No intention or plan   Homicidal Thoughts: Denies   Sensorium  Memory: Immediate Fair; Recent Fair   Judgment: Improving   Insight: Fair     Chartered certified accountant: Fair   Attention Span: Fair   Recall: Good   Fund of Knowledge: Good   Language: Good     Psychomotor Activity  Psychomotor Activity: Normal Assets  Assets: Communication Skills; Desire for Improvement; Social Support; Housing; Transportation     Sleep  Sleep: Fair   Physical Exam: Physical Exam Constitutional:      Appearance: Normal appearance.  HENT:     Head: Normocephalic and atraumatic.     Nose: Nose normal.     Mouth/Throat:     Mouth: Mucous membranes are moist.  Eyes:     Pupils: Pupils are equal, round, and reactive to light.  Cardiovascular:     Rate and Rhythm: Normal rate.     Pulses: Normal pulses.  Pulmonary:     Effort: Pulmonary  effort is normal.  Skin:    General: Skin is warm.  Neurological:     General: No focal deficit present.     Mental Status: She is alert and oriented to person, place, and time.      Review of Systems  Constitutional:  Negative for chills and fever.  HENT:  Negative for congestion, hearing loss and sore throat.   Eyes:  Negative for blurred vision and double vision.  Respiratory:  Negative for cough and shortness of breath.   Cardiovascular:  Negative for chest pain and palpitations.  Gastrointestinal:  Negative for nausea and vomiting.   Blood pressure (!) 116/43, pulse (!) 56, temperature 98.3 F (36.8 C), resp. rate 14, height 5' (1.524 m), weight 51.3 kg, SpO2 99%. Body mass index is 22.07 kg/m.   Demographic Factors:  Age 68 or older, Caucasian, and Living alone  Loss Factors: Loss of significant relationship    Risk Reduction Factors:   Positive social support, Positive therapeutic relationship, and Positive coping skills or problem solving skills  Continued Clinical Symptoms:  Previous Psychiatric Diagnoses and Treatments  Cognitive Features That Contribute To Risk:  Thought constriction (tunnel vision)    Suicide Risk:  Minimal: No identifiable suicidal ideation.     Plan Of Care/Follow-up recommendations:  Per Discharge Summary  Lewanda Rife, MD

## 2023-05-08 NOTE — Plan of Care (Signed)
  Problem: Health Behavior/Discharge Planning: Goal: Ability to manage health-related needs will improve Outcome: Progressing   Problem: Clinical Measurements: Goal: Ability to maintain clinical measurements within normal limits will improve Outcome: Progressing Goal: Will remain free from infection Outcome: Progressing Goal: Diagnostic test results will improve Outcome: Progressing   Problem: Activity: Goal: Risk for activity intolerance will decrease Outcome: Progressing   Problem: Nutrition: Goal: Adequate nutrition will be maintained Outcome: Progressing   Problem: Coping: Goal: Level of anxiety will decrease Outcome: Progressing

## 2023-05-08 NOTE — Plan of Care (Signed)
Patient discharged to home with belongings and discharge paperwork. Escorted to exit by [facility] to awaiting private vehicle.

## 2023-05-08 NOTE — Group Note (Signed)
Date:  05/08/2023 Time:  10:57 AM  Group Topic/Focus:  Movement therapy    Participation Level:  Active  Participation Quality:  Appropriate  Affect:  Appropriate  Cognitive:  Appropriate  Insight: Appropriate  Engagement in Group:  Engaged  Modes of Intervention:  Activity  Additional Comments:  none  Rodena Goldmann 05/08/2023, 10:57 AM

## 2023-05-08 NOTE — Progress Notes (Signed)
  [facility] Adult Case Management Discharge Plan :  Will you be returning to the same living situation after discharge:  Yes,  pt will be returning home  At discharge, do you have transportation home?: Yes,  pt's cousin Enid Derry will be picking her up  Do you have the ability to pay for your medications: Yes,  [employer] / [employer] PPO  Release of information consent forms completed and in the chart;  Patient's signature needed at discharge.  Patient to Follow up at:  Follow-up Information     [facility] Follow up.   Why: Although you declined follow-up services I am including the walk-in hours for [facility].  Walk-in hours are from 8:00 AM - 3:00 PM daily.  [facility] (near [employer]) [address], [address] [place], [place] [address] [phone] Fax: [phone] Hours of Operation: Monday-Friday, 8 a.m. - 5 p.m. Office closed for lunch, 12 p.m. - 1 p.m. The last Open Access walk-in will be taken at 3 p.m. each day Contact information: [address]  [address] [place] [address] [phone]         [facility]. Go to.   Specialty: Urgent Care Why: Open 24/7 no appointment required Contact information: [address] [place] [address] [phone]                Next level of care provider has access to [facility] Link:no  Safety Planning and Suicide Prevention discussed: Yes,  Vivien Rossetti, cousin, [phone]      Has patient been referred to the Quitline?: Patient does not use tobacco/nicotine products  Patient has been referred for addiction treatment: No known substance use disorder.  [address], LCSWA 05/08/2023, 10:38 AM

## 2023-05-08 NOTE — Consult Note (Signed)
ANTICOAGULATION CONSULT NOTE   Pharmacy Consult for Warfarin Indication:  Hx of VTE  Patient Measurements: Height: 5' (152.4 cm) Weight: 51.3 kg (113 lb) IBW/kg (Calculated) : 45.5  Labs: Recent Labs    05/06/23 1211 05/07/23 0730 05/08/23 0657  HGB  --   --  13.1  HCT  --   --  38.8  PLT  --   --  264  LABPROT 27.9* 28.8* 31.4*  INR 2.6* 2.7* 3.0*   Estimated Creatinine Clearance: 43.6 mL/min (by C-G formula based on SCr of 0.59 mg/dL).  Medical History: Past Medical History:  Diagnosis Date   Abnormal EKG    Anxiety    Arthritis    oa   Bradycardia 12/14/2017   CIN III (cervical intraepithelial neoplasia III) 2000   Complication of anesthesia    did well last 2 times with procedures   Depression    DES exposure in utero    DVT (deep venous thrombosis) (HCC) 01/2009   LEFT LEG   DVT (deep venous thrombosis) (HCC) 10/30/2020   Dyspnea on exertion 10/2017   Elevated triglycerides with high cholesterol    Endometriosis    Fibromyalgia    GERD (gastroesophageal reflux disease)    History of colon polyps    History of hiatal hernia    told by some md has, some say not   IBS (irritable bowel syndrome) 2015   Left bundle branch block    intermittent   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  low TSH 0.22 on 03/24/23.   Vitamin D deficiency    Assessment:  77 y.o. female patient referred to lipid clinic by Eligha Bridegroom, NP. PMH is significant for recurrent DVT/PE with positive lupus anticoagulant (followed by Dr. Myna Hidalgo), HLD, sinus bradycardia, GERD, intermittent LBBB, hyperthyroidism and anxiety.  CTA with FFR  done in 2024 for chest pain. CAC 1 (29th percentile), CT FFR analysis did not show any significant stenosis. There is visual stenosis at ostial LAD, but FFR is not significant. Med rec tech spoke with patient/family and last day of warfarin unknown. Pt was admitted to [facility] on 9/26 with a supratherapeutic INR. No major DDI.   Warfarin regimen per Coumadin Clinic notes: Warfarin 1 mg daily except for 1/2 tablet on Sundays, Tuesdays, and Thursdays 5.5mg  /week TTR 55.6% in 2.2 years  DDI: ropirinole  Date INR Warfarin Dose  9/28 2.6 HELD  9/29 2.3 1 mg   9/30 2.1 1 mg   10/1 2.1 1 mg  10/2 2.1 1 mg  10/3 2.2 1 mg   10/4 2.6 1 mg  10/5 2.6 1 mg  10/6 2.7 1 mg  10/7 3.0            Goal of Therapy:  INR 2.5 - 3.5   pt is at a high risk for VTE. Goal obtain from Heme/Onc note from 10/2022.  Monitor platelets by anticoagulation protocol: Yes   Plan:  INR is therapeutic and trending upwards. CBC stable without s/sx of bleeding. Will resume home dose of 1mg  daily except for 0.5mg  on Su/TU/TH Change  INR checks to every other day (10 days of daily INR. Patient on psych admission and medically on stable). Can consider decrease frequency to q3 days if patient and INR remain stable. change CBC to weekly while inpatient  Aidee Latimore Rodriguez-Guzman PharmD, BCPS 05/08/2023 10:25 AM

## 2023-05-08 NOTE — [facility] Suicide Risk Assessment (Signed)
[facility] INPATIENT:  Family/Significant Other Suicide Prevention Education  Suicide Prevention Education:  Education Completed; Iva Boop, [phone],  has been identified by the patient as the family member/significant other with whom the patient will be residing, and identified as the person(s) who will aid the patient in the event of a mental health crisis (suicidal ideations/suicide attempt).  With written consent from the patient, the family member/significant other has been provided the following suicide prevention education, prior to the and/or following the discharge of the patient.  The suicide prevention education provided includes the following: Suicide risk factors Suicide prevention and interventions National Suicide Hotline telephone number [facility] assessment telephone number [facility] Emergency Assistance 911 [facility] and/or Residential Mobile Crisis Unit telephone number  Request made of family/significant other to: Remove weapons (e.g., guns, rifles, knives), all items previously/currently identified as safety concern.   Remove drugs/medications (over-the-counter, prescriptions, illicit drugs), all items previously/currently identified as a safety concern.  The family member/significant other verbalizes understanding of the suicide prevention education information provided.  The family member/significant other agrees to remove the items of safety concern listed above.  Elza Rafter 05/08/2023, 10:40 AM

## 2023-05-08 NOTE — Care Management Important Message (Signed)
Important Message  Patient Details  Name: Emma Lopez MRN: [id number] Date of Birth: 05-07-1946   Important Message Given:  Yes - Medicare IM     Elza Rafter, LCSWA 05/08/2023, 10:43 AM

## 2023-05-09 ENCOUNTER — Telehealth: Payer: Self-pay

## 2023-05-09 NOTE — Telephone Encounter (Signed)
Received sleep referral from [facility] - let referring office know we do not need referral as patient is active in our office and does not need new patient appointment. If patient has concerns they would like to address with Dr. Vickey Huger they may reach out via phone or MyChart to schedule an office visit.

## 2023-05-11 NOTE — Progress Notes (Unsigned)
Cardiology Office Note:  .   Date:  05/18/2023  ID:  Emma Lopez, DOB November 11, 1945, MRN [id number] PCP: Melida Quitter, MD  [facility] Providers Cardiologist:  Armanda Magic, MD   History of Present Illness: .   Emma Lopez is a 77 y.o. female with a past medical history of recurrent DVT/PE with positive lupus anticoagulant (followed by Dr. Myna Hidalgo), hyperlipidemia, sinus bradycardia, GERD, intermittent LBBB, hyper thyroidism, anxiety.  Patient is followed by Dr. Mayford Knife and presents today for preoperative evaluation.  Patient previously underwent echocardiogram in 12/08/17 that showed EF 60-65%, no regional wall motion abnormalities, grade 1 DD, normal RV function, no significant valvular abnormalities.  Underwent coronary CTA on 01/10/18 that showed a coronary calcium score of 0, no significant coronary artery disease noted.  Cardiac monitor in 06/2020 showed sinus rhythm with average heart rate 55 bpm, nonsustained atrial tachycardia up to 19 beats, accelerated junctional rhythm, rare PACs and PVCs.  Underwent exercise tolerance test in 08/2020 to assess for chronotropic incontinence.  Found to have a heart rate response to 148 bpm, 4.6 METS, hypertensive response with ST/T wave changes after exercise.  Overall intermittent risk in the context of baseline EKG abnormalities (appears she had a LBBB at the time, previously told it only appears intermittently.  Patient recently had a dobutamine stress test at an outside facility in 06/2020 that showed no evidence of ischemia, Dr. Mayford Knife felt that the exercise tolerance test was false positive.  Echocardiogram in 10/2020 showed EF 60-65%, no regional wall motion abnormalities, normal RV function, normal pulmonary artery systolic pressure.  Patient has recurrent DVTs/PEs, positive lupus anticoagulant.  Is followed by Dr. Myna Hidalgo.  Was previously on Xarelto and Pradaxa, now on Coumadin.  Patient was last seen by cardiology on 11/09/2022.  At that time,  patient reported having some chest pain, wonders if they were caused by anxiety.  She underwent coronary CTA on 11/14/2022 that showed a coronary calcium score of 1 (29th percentile), there was moderate stenosis, ostial LAD with minimal plaque but visual 50-69% stenosis, 1-24% stenosis in LCx, RCA.  And study was sent for FFR.  FFR showed no significant stenosis.  Today, patient was initially scheduled for a pre-procedural evaluation for a colonoscopy. However, the patient decided to cancel the procedure, citing recent weight loss and abdominal issues that had since subsided. The patient had a colonoscopy a year prior and felt another was unnecessary at this time. Her most recent health concern has been depression, for which she sought help at a psychiatric facility. After trialing various antidepressants, the patient returned to a previous medication (lexapro) and added a small dose of Abilify, which has led to significant improvement in her mood. In the past, patient had what she describes as "fits" - characterized by feelings of anxiety and perceived cardiac issues. Since her anxiety has been better controlled, she does not have these episodes any more. Denies chest pain, shortness of breath. No syncope, near syncope, palpitations.   The patient was previously on a statin for cholesterol management but experienced significant fatigue. After trialing two oral statins, the patient was switched to Repatha. The patient has had three doses of Repatha but admits to possibly skipping a dose due to feeling unwell. The patient is awaiting lab work to assess the effectiveness of the new medication. The patient is on Coumadin and is followed by a Coumadin clinic. There were concerns about high therapeutic ranges while the patient was hospitalized, but the patient is now  back on her alternating dosage regimen.   The patient's current concern is persistent headaches, described as nagging and located on either side of the  head. The patient denies major pain or migraine-like symptoms but plans to consult with her primary care provider tomorrow due to the ongoing nature of the headaches.   ROS: Denies chest pain, palpitations, syncope, near syncope, ankle edema, shortness of breath.   Studies Reviewed: .   Cardiac Studies & Procedures     STRESS TESTS  EXERCISE TOLERANCE TEST (ETT) 08/20/2020  Narrative  Blood pressure demonstrated a hypertensive response to exercise.  Exercise time 4 minutes and 31 seconds, 4.6 METS, fair  Heart rate able to increase to 121 bpm.  Immediately after exercise, there is 2 mm ST segment depression in the inferior leads as well as V6. This is accentuation of nonspecific ST-T wave changes noted at baseline. Cannot exclude ischemia.  Intermediate risk stress test with ST changes noted as above. Cannot exclude the possibility of ischemia.  Donato Schultz, MD   ECHOCARDIOGRAM  ECHOCARDIOGRAM COMPLETE 11/15/2020  Narrative ECHOCARDIOGRAM REPORT    Patient Name:   Emma Lopez Date of Exam: 11/15/2020 Medical Rec #:  [id number]     Height:       62.0 in Accession #:    [id number]    Weight:       135.8 lb Date of Birth:  [birth year]     BSA:          1.622 m Patient Age:    74 years      BP:           124/58 mmHg Patient Gender: F             HR:           63 bpm. Exam Location:  Inpatient  Procedure: 2D Echo, Cardiac Doppler and Color Doppler  Indications:    R07.9* Chest pain, unspecified  History:        Patient has prior history of Echocardiogram examinations, most recent 12/08/2017. Risk Factors:Dyslipidemia. Pulmonary Embolus. Thyroid Disease. GERD.  Sonographer:    Tiffany Dance Referring Phys: [id number] Dorothe Pea BRANCH  IMPRESSIONS   1. Left ventricular ejection fraction, by estimation, is 60 to 65%. The left ventricle has normal function. The left ventricle has no regional wall motion abnormalities. Left ventricular diastolic parameters are  indeterminate. 2. Right ventricular systolic function is normal. The right ventricular size is normal. There is normal pulmonary artery systolic pressure. 3. The mitral valve is normal in structure. Trivial mitral valve regurgitation. No evidence of mitral stenosis. 4. The aortic valve has an indeterminant number of cusps. Aortic valve regurgitation is not visualized. No aortic stenosis is present. 5. The inferior vena cava is normal in size with greater than 50% respiratory variability, suggesting right atrial pressure of 3 mmHg.  FINDINGS Left Ventricle: Left ventricular ejection fraction, by estimation, is 60 to 65%. The left ventricle has normal function. The left ventricle has no regional wall motion abnormalities. The left ventricular internal cavity size was normal in size. There is no left ventricular hypertrophy. Left ventricular diastolic parameters are indeterminate.  Right Ventricle: The right ventricular size is normal. No increase in right ventricular wall thickness. Right ventricular systolic function is normal. There is normal pulmonary artery systolic pressure. The tricuspid regurgitant velocity is 2.41 m/s, and with an assumed right atrial pressure of 8 mmHg, the estimated right ventricular systolic pressure is 31.2 mmHg.  Left Atrium: Left atrial  size was normal in size.  Right Atrium: Right atrial size was normal in size.  Pericardium: There is no evidence of pericardial effusion.  Mitral Valve: The mitral valve is normal in structure. Trivial mitral valve regurgitation. No evidence of mitral valve stenosis.  Tricuspid Valve: The tricuspid valve is normal in structure. Tricuspid valve regurgitation is trivial. No evidence of tricuspid stenosis.  Aortic Valve: The aortic valve has an indeterminant number of cusps. Aortic valve regurgitation is not visualized. No aortic stenosis is present.  Pulmonic Valve: The pulmonic valve was not well visualized. Pulmonic valve  regurgitation is trivial. No evidence of pulmonic stenosis.  Aorta: The aortic root is normal in size and structure.  Pulmonary Artery: Indeterminant PASP, inadequate TR jet.  Venous: The inferior vena cava is normal in size with greater than 50% respiratory variability, suggesting right atrial pressure of 3 mmHg.  IAS/Shunts: No atrial level shunt detected by color flow Doppler.   LEFT VENTRICLE PLAX 2D LVIDd:         3.90 cm  Diastology LVIDs:         2.50 cm  LV e' medial:    5.87 cm/s LV PW:         1.10 cm  LV E/e' medial:  14.1 LV IVS:        1.00 cm  LV e' lateral:   7.62 cm/s LVOT diam:     1.90 cm  LV E/e' lateral: 10.8 LV SV:         74 LV SV Index:   46 LVOT Area:     2.84 cm   RIGHT VENTRICLE             IVC RV Basal diam:  2.90 cm     IVC diam: 2.10 cm RV S prime:     14.70 cm/s TAPSE (M-mode): 2.3 cm  LEFT ATRIUM             Index       RIGHT ATRIUM           Index LA diam:        3.80 cm 2.34 cm/m  RA Area:     11.00 cm LA Vol (A2C):   50.3 ml 31.02 ml/m RA Volume:   20.30 ml  12.52 ml/m LA Vol (A4C):   38.8 ml 23.93 ml/m LA Biplane Vol: 44.5 ml 27.44 ml/m AORTIC VALVE LVOT Vmax:   117.08 cm/s LVOT Vmean:  73.418 cm/s LVOT VTI:    0.262 m  AORTA Ao Root diam: 2.80 cm Ao Asc diam:  3.10 cm  MITRAL VALVE               TRICUSPID VALVE MV Area (PHT): 2.54 cm    TR Peak grad:   23.2 mmHg MV Decel Time: 299 msec    TR Vmax:        241.00 cm/s MV E velocity: 82.50 cm/s MV A velocity: 82.50 cm/s  SHUNTS MV E/A ratio:  1.00        Systemic VTI:  0.26 m Systemic Diam: 1.90 cm  Dina Rich MD Electronically signed by Dina Rich MD Signature Date/Time: 11/15/2020/11:29:34 AM    Final    MONITORS  LONG TERM MONITOR (3-14 DAYS) 06/23/2020  Narrative  Sinus bradycardia, normal sinus rhythm and sinus tachycardia. The average herat rate was 55bpm and ranged from 45 to 127bpm.  Nonsustained atrial tachycardia up to 19 beats at 118bpm.   Accelerated junctional rhythm.  Rare PAC and  PVC.  Atrial triplet.   CT SCANS  CT CORONARY MORPH W/CTA COR W/SCORE 11/14/2022  Addendum 11/18/2022 12:33 AM ADDENDUM REPORT: 11/18/2022 00:31  EXAM: OVER-READ INTERPRETATION  CT CHEST  The following report is an over-read performed by radiologist Dr. Myra Gianotti [facility] on 11/18/2022. This over-read does not include interpretation of cardiac or coronary anatomy or pathology. The coronary CTA interpretation by the cardiologist is attached.  COMPARISON:  CTA chest 10/03/2022  FINDINGS: The imaged portion of the mediastinum is unremarkable. No visualized focal consolidation, pleural effusion, or pneumothorax. Medial right lower lobe pulmonary nodule measuring 11 x 5 mm has decreased in size from 10/03/2022 when it measured 14 mm suggesting a benign or indolent process. This is adjacent to a prominent anterior osteophyte in the thoracic spine suggesting this may be related to chronic atelectasis/scarring. No acute abnormality in the visualized upper abdomen. No acute osseous abnormality.  IMPRESSION: No acute abnormality.   Electronically Signed By: Minerva Fester M.D. On: 11/18/2022 00:31  Narrative HISTORY: Chest pain, nonspecific  EXAM: Cardiac/Coronary CT  TECHNIQUE: The patient was scanned on a [employer].  PROTOCOL: A 120 kV prospective scan was triggered in the descending thoracic aorta at 111 HU's. Axial non-contrast 3 mm slices were carried out through the heart. The data set was analyzed on a dedicated work station and scored using the Agatston method. Gantry rotation speed was 250 msecs and collimation was 0.6 mm. Heart rate was optimized medically and sl NTG was given. The 3D data set was reconstructed in 5% intervals of the 35-75 % of the R-R cycle. Systolic and diastolic phases were analyzed on a dedicated work station using MPR, MIP and VRT modes. The patient received  OMNIPAQUE IOHEXOL 350 MG/ML SOLN of contrast.  FINDINGS: Coronary calcium score: The patient's coronary artery calcium score is 1, which places the patient in the 29th percentile.  Coronary arteries: Normal coronary origins.  Right dominance.  Right Coronary Artery: Normal caliber vessel, gives rise to PDA. Minimal noncalcified plaque in proximal vessel with 1-24% stenosis.  Left Main Coronary Artery: Normal caliber vessel. No significant plaque or stenosis.  Left Anterior Descending Coronary Artery: Normal caliber vessel. Minimal mixed calcified and noncalcified plaque at the ostial LAD, with 50-69% stenosis. Gives rise to two small diagonal branches.  Left Circumflex Artery: Normal caliber vessel. Minimal noncalcified plaque in proximal vessel with 1-24% stenosis. Gives rise to no significant OM branches.  Aorta: Normal size, 31 mm at the mid ascending aorta (level of the PA bifurcation) measured double oblique. Trivial aortic atherosclerosis. No dissection seen in visualized portions of the aorta.  Aortic Valve: Minimal calcifications. Trileaflet.  Other findings:  Normal pulmonary vein drainage into the left atrium.  Normal left atrial appendage without a thrombus.  Normal size of the pulmonary artery.  Normal appearance of the pericardium.  IMPRESSION: 1. Moderate stenosis, CADRADS = 3. Ostial LAD with minimal plaque but visual 50-69% stenosis. CT FFR will be performed and reported separately. Maximal stenosis 1-24% in LCX and RCA.  2. Coronary calcium score of 1. This was 29th percentile for age-, sex-, and race- matched controls.  3. Total plaque volume 12 mm3 which is <10th percentile for age- and sex- matched controls (calcified plaque 0 mm3; noncalcified plaque 12 mm3). TPV is mild.  4. Normal coronary origin with right dominance.  INTERPRETATION:  CAD-RADS 3: Moderate stenosis (50-69%). Consider symptom-guided anti-ischemic pharmacotherapy as  well as risk factor modification per guideline directed care. Additional  analysis with CT FFR will be submitted.  Electronically Signed: By: Jodelle Red M.D. On: 11/15/2022 07:51   CT SCANS  CT CORONARY MORPH W/CTA COR W/SCORE 01/10/2018  Addendum 01/10/2018  3:33 PM ADDENDUM REPORT: 01/10/2018 15:31  EXAM: OVER-READ INTERPRETATION  CT CHEST  The following report is an over-read performed by radiologist Dr. Arliss Journey [facility] on 01/10/2018. This over-read does not include interpretation of cardiac or coronary anatomy or pathology. The CTA interpretation by the cardiologist is attached.  COMPARISON:  11/17/2017 diagnostic chest CT/PET study.  FINDINGS: Vascular: Normal aortic caliber, without evidence of dissection. No central pulmonary embolism, on this non-dedicated study.  Mediastinum/Nodes: No imaged thoracic adenopathy.  Lungs/Pleura: Areas of mild pleural thickening. No pleural fluid. Left base scarring or atelectasis.  Upper Abdomen: Normal imaged portions of the liver, spleen, stomach.  Musculoskeletal: Mild thoracic spondylosis.  IMPRESSION: No acute findings in the imaged extracardiac chest.   Electronically Signed By: Jeronimo Greaves M.D. On: 01/10/2018 15:31  Narrative CLINICAL DATA:  Chest pain  EXAM: Cardiac CTA  MEDICATIONS: Sub lingual nitro.4mg  x 2  TECHNIQUE: The patient was scanned on a Siemens 192 slice scanner. Gantry rotation speed was 250 msecs. Collimation was 0.6 mm. A 100 kV prospective scan was triggered in the ascending thoracic aorta at 35-75% of the R-R interval. Average HR during the scan was 60 bpm. The 3D data set was interpreted on a dedicated work station using MPR, MIP and VRT modes. A total of 80cc of contrast was used.  FINDINGS: Non-cardiac: See separate report from [facility].  Calcium Score: 0 Agatston units.  Coronary Arteries: Right dominant with no anomalies  LM: No  plaque or stenosis.  LAD system: No plaque or stenosis.  Circumflex system: No plaque or stenosis.  RCA system: No plaque or stenosis.  IMPRESSION: 1. Coronary artery calcium score 0 Agatston units, suggesting low risk for future cardiac events.  2.  No significant coronary artery disease noted.  [employer] account executive  Electronically Signed: By: Marca Ancona M.D. On: 01/10/2018 14:54          Risk Assessment/Calculations:             Physical Exam:   VS:  BP 128/64   Pulse (!) 58   Ht 5\' 2"  (1.575 m)   Wt 117 lb 3.2 oz (53.2 kg)   SpO2 99%   BMI 21.44 kg/m    Wt Readings from Last 3 Encounters:  05/18/23 117 lb 3.2 oz (53.2 kg)  05/05/23 113 lb (51.3 kg)  04/26/23 113 lb 1.5 oz (51.3 kg)    GEN: Well nourished, well developed in no acute distress. Sitting comfortably on the exam table  NECK: No JVD; No carotid bruits CARDIAC: RRR, no murmurs, rubs, gallops. Radial pulses 2+ bilaterally  RESPIRATORY:  Clear to auscultation without rales, wheezing or rhonchi. Normal work of breathing on room air  ABDOMEN: Soft, non-tender, non-distended EXTREMITIES:  No edema in BLE; No deformity   ASSESSMENT AND PLAN: .    Hyperlipidemia -In the past, patient was concerned that statin therapy was contributing to pain with her fibromyalgia -Now on Repatha- tolerating well  -Lipid panel from 05/2023 showed LDL 66  Sinus bradycardia -Cardiac monitor from 06/2020 showed average heart rate 55 bpm.  Heart rate could get as high as 127 bpm. - Patient denies significant fatigue, syncope, near syncope. No palpitations  - Avoid use of AV nodal medications  Nonobstructive CAD -Underwent coronary CTA in 10/2022 that  showed coronary calcium score of 1.  50-69% stenosis in ostial LAD with minimal plaque, maximum 1-24% stenosis in left circumflex, RCA.  Study was sent for [facility] which showed no significant stenosis - Patient denies chest pain. Reports that in the past, she had some chest pain when  she would get "Fits" or very anxious. Since her anxiety has been better controlled, she has not been having chest pain. No DOE -Patient not on aspirin due to Coumadin use -On Repatha for risk reduction   Recurrent DVT/PE -Positive lupus anticoagulant -Followed by Dr. Myna Hidalgo (heme-onc) -On Coumadin.  Followed by our Coumadin clinic and seeing them later today.  Goal INR 2.5-3.  Dispo: Follow up in 6 months   Signed, Jonita Albee, PA-C

## 2023-05-16 ENCOUNTER — Encounter: Payer: PPO | Admitting: Neurology

## 2023-05-18 ENCOUNTER — Encounter: Payer: Self-pay | Admitting: Cardiology

## 2023-05-18 ENCOUNTER — Ambulatory Visit: Payer: PPO | Admitting: *Deleted

## 2023-05-18 ENCOUNTER — Ambulatory Visit: Payer: PPO | Attending: Nurse Practitioner | Admitting: Cardiology

## 2023-05-18 VITALS — BP 128/64 | HR 58 | Ht 62.0 in | Wt 117.2 lb

## 2023-05-18 DIAGNOSIS — R001 Bradycardia, unspecified: Secondary | ICD-10-CM

## 2023-05-18 DIAGNOSIS — E785 Hyperlipidemia, unspecified: Secondary | ICD-10-CM | POA: Diagnosis not present

## 2023-05-18 DIAGNOSIS — R931 Abnormal findings on diagnostic imaging of heart and coronary circulation: Secondary | ICD-10-CM

## 2023-05-18 DIAGNOSIS — Z86711 Personal history of pulmonary embolism: Secondary | ICD-10-CM | POA: Diagnosis not present

## 2023-05-18 DIAGNOSIS — Z7901 Long term (current) use of anticoagulants: Secondary | ICD-10-CM

## 2023-05-18 DIAGNOSIS — I82401 Acute embolism and thrombosis of unspecified deep veins of right lower extremity: Secondary | ICD-10-CM

## 2023-05-18 DIAGNOSIS — Z5181 Encounter for therapeutic drug level monitoring: Secondary | ICD-10-CM | POA: Diagnosis not present

## 2023-05-18 LAB — POCT INR: INR: 2.4 (ref 2.0–3.0)

## 2023-05-18 NOTE — Patient Instructions (Signed)
Description   Today take 1 tablet of warfarin then continue taking Warfarin 1 tablet daily except for 1/2 tablet on Sundays, Tuesdays, and Thursdays. Recheck INR 2 weeks.   Coumadin Clinic [phone]

## 2023-05-18 NOTE — Patient Instructions (Signed)
Medication Instructions:  No changes *If you need a refill on your cardiac medications before your next appointment, please call your pharmacy*   Lab Work: No labs  Testing/Procedures: No testing   Follow-Up: At [facility], you and your health needs are our priority.  As part of our continuing mission to provide you with exceptional heart care, we have created designated Provider Care Teams.  These Care Teams include your primary Cardiologist (physician) and Advanced Practice Providers (APPs -  Physician Assistants and Nurse Practitioners) who all work together to provide you with the care you need, when you need it.  We recommend signing up for the patient portal called "MyChart".  Sign up information is provided on this After Visit Summary.  MyChart is used to connect with patients for Virtual Visits (Telemedicine).  Patients are able to view lab/test results, encounter notes, upcoming appointments, etc.  Non-urgent messages can be sent to your provider as well.   To learn more about what you can do with MyChart, go to [email or web address].    Your next appointment:   6 month(s)  Provider:   Any APP from [address]

## 2023-05-20 LAB — 25-HYDROXY VITAMIN D LCMS D2+D3
25-Hydroxy, Vitamin D-2: 1 ng/mL
25-Hydroxy, Vitamin D-3: 38 ng/mL
25-Hydroxy, Vitamin D: 39 ng/mL

## 2023-05-22 DIAGNOSIS — G4452 New daily persistent headache (NDPH): Secondary | ICD-10-CM | POA: Diagnosis not present

## 2023-05-22 DIAGNOSIS — E559 Vitamin D deficiency, unspecified: Secondary | ICD-10-CM | POA: Diagnosis not present

## 2023-05-22 DIAGNOSIS — M5030 Other cervical disc degeneration, unspecified cervical region: Secondary | ICD-10-CM | POA: Diagnosis not present

## 2023-05-22 DIAGNOSIS — F339 Major depressive disorder, recurrent, unspecified: Secondary | ICD-10-CM | POA: Diagnosis not present

## 2023-05-22 DIAGNOSIS — M47812 Spondylosis without myelopathy or radiculopathy, cervical region: Secondary | ICD-10-CM | POA: Diagnosis not present

## 2023-05-25 ENCOUNTER — Encounter: Payer: Self-pay | Admitting: Behavioral Health

## 2023-05-25 ENCOUNTER — Ambulatory Visit: Payer: PPO | Admitting: Behavioral Health

## 2023-05-25 DIAGNOSIS — F411 Generalized anxiety disorder: Secondary | ICD-10-CM | POA: Diagnosis not present

## 2023-05-25 DIAGNOSIS — L821 Other seborrheic keratosis: Secondary | ICD-10-CM | POA: Diagnosis not present

## 2023-05-25 DIAGNOSIS — B078 Other viral warts: Secondary | ICD-10-CM | POA: Diagnosis not present

## 2023-05-25 DIAGNOSIS — F331 Major depressive disorder, recurrent, moderate: Secondary | ICD-10-CM

## 2023-05-25 DIAGNOSIS — D485 Neoplasm of uncertain behavior of skin: Secondary | ICD-10-CM | POA: Diagnosis not present

## 2023-05-25 DIAGNOSIS — L820 Inflamed seborrheic keratosis: Secondary | ICD-10-CM | POA: Diagnosis not present

## 2023-05-25 MED ORDER — ARIPIPRAZOLE 2 MG PO TABS
2.0000 mg | ORAL_TABLET | Freq: Every day | ORAL | 3 refills | Status: DC
Start: 2023-05-25 — End: 2023-07-02

## 2023-05-25 MED ORDER — ESCITALOPRAM OXALATE 20 MG PO TABS: 20.0000 mg | ORAL_TABLET | Freq: Every day | ORAL | 1 refills | Status: DC

## 2023-05-25 MED ORDER — ARIPIPRAZOLE 2 MG PO TABS
2.0000 mg | ORAL_TABLET | Freq: Every day | ORAL | 3 refills | Status: DC
Start: 2023-05-25 — End: 2023-05-25

## 2023-05-25 NOTE — Progress Notes (Addendum)
[facility]  Patient ID: ALASKA BUTE,  MRN: [id number]  PCP: Melida Quitter, MD  Date of Evaluation: 05/25/2023 Time spent:30 minutes  Chief Complaint:  Chief Complaint   Anxiety; Depression; Follow-up; Medication Refill; Patient Education     HISTORY/CURRENT STATUS: HPI "[name]", 77 year old female presents to this office for follow up and medication management. Collateral information should be considered reliable.  Pt went to ER on 9/25 due to increase in depression and was admitted [facility]. Medication was changed back to Abilify and Lexapro. She says that Abilify has been a Secretary/administrator and she "feels great". She is getting back out again and being more active. She does not want to adjust medication but says that she needs refills.    She reports depression today at 1/10, and anxiety at 2/10.  She is sleeping 7 to 8 hours per night. PHQ-2 was negative.  She denies any history of mania, no psychosis, no auditory or visual hallucinations, no delirium.  Denies suicidal ideation or homicidal ideation. Pt feels safe and verbally contracts for safety.    Past psychiatric medication trials:  Lexapro Cymbalta BuSpar Viibryd  Zoloft   Individual Medical History/ Review of Systems: Changes? :No   Allergies: Hydroxyzine and Sulfa antibiotics  Current Medications:  Current Outpatient Medications:    ARIPiprazole (ABILIFY) 2 MG tablet, Take 1 tablet (2 mg total) by mouth daily., Disp: 30 tablet, Rfl: 3   escitalopram (LEXAPRO) 20 MG tablet, Take 1 tablet (20 mg total) by mouth daily., Disp: 90 tablet, Rfl: 1   Evolocumab (REPATHA SURECLICK) 140 MG/ML SOAJ, Inject 140 mg into the skin every 14 (fourteen) days., Disp: 2 mL, Rfl: 11   rOPINIRole (REQUIP) 1 MG tablet, Take 1 tablet (1 mg total) by mouth at bedtime., Disp: 90 tablet, Rfl: 2   Vitamin D, Ergocalciferol, (DRISDOL) 1.25 MG (50000 UNIT) CAPS capsule, Take 1 capsule (50,000 Units total) by mouth every 7 (seven) days., Disp:  4 capsule, Rfl: 0   warfarin (COUMADIN) 1 MG tablet, Take 1/2 tablet to 1 tablet by mouth daily or as directed by Anticoagulation Clinic. (Patient taking differently: Take 0.5-1 mg by mouth See admin instructions. Take 0.5 mg by mouth in the morning on Sun/Tues/Thurs and 1 mg on Mon/Wed/Fri/Sat), Disp: 90 tablet, Rfl: 1  Current Facility-Administered Medications:    denosumab (PROLIA) injection 60 mg, 60 mg, Subcutaneous, Once, [name], MD Medication Side Effects: none  Family Medical/ Social History: Changes? No  MENTAL HEALTH EXAM:  There were no vitals taken for this visit.There is no height or weight on file to calculate BMI.  General Appearance: Casual, Neat, and Well Groomed  Eye Contact:  Good  Speech:  Clear and Coherent  Volume:  Normal  Mood:  Anxious and Depressed  Affect:  Depressed and Anxious  Thought Process:  Coherent  Orientation:  Full (Time, Place, and Person)  Thought Content: Logical   Suicidal Thoughts:  No  Homicidal Thoughts:  No  Memory:  WNL  Judgement:  Fair  Insight:  Fair  Psychomotor Activity:  Normal  Concentration:  Concentration: Good  Recall:  Good  Fund of Knowledge: Fair  Language: Good  Assets:  Desire for Improvement  ADL's:  Intact  Cognition: WNL  Prognosis:  Good    DIAGNOSES:    ICD-10-CM   1. Generalized anxiety disorder  F41.1 ARIPiprazole (ABILIFY) 2 MG tablet    DISCONTINUED: ARIPiprazole (ABILIFY) 2 MG tablet    2. Major depressive disorder,  recurrent episode, moderate (HCC)  F33.1 ARIPiprazole (ABILIFY) 2 MG tablet    DISCONTINUED: ARIPiprazole (ABILIFY) 2 MG tablet      Receiving Psychotherapy: No    RECOMMENDATIONS:  Greater than 50% of 30 min face to face time with patient was spent on counseling and coordination of care. She feels much better since starting Lexapro and Abilify. We discussed her desire to stop coming in for appointments and I explained that these medications require supervision.  She acknowledged understanding I assured her that would would decrease appointment time if she maintains stability. She is considering retirement community.    Recommended Mag Glycinate supplement at bedtime.    We agreed to:   To continue with Lexapro 20 mg daily To continue with Abilify 2 mg daily at bedtime Will report side effects or worsening symptoms promptly Will follow-up in 8 weeks to reassess per pt Provided emergency contact information Reviewed PDMP        Joan Flores, NP

## 2023-05-30 ENCOUNTER — Ambulatory Visit (HOSPITAL_BASED_OUTPATIENT_CLINIC_OR_DEPARTMENT_OTHER)
Admission: RE | Admit: 2023-05-30 | Discharge: 2023-05-30 | Disposition: A | Discharge status: Home / Self Care | Payer: PPO | Source: Ambulatory Visit | Attending: Hematology & Oncology | Admitting: Hematology & Oncology

## 2023-05-30 ENCOUNTER — Other Ambulatory Visit: Payer: Self-pay

## 2023-05-30 ENCOUNTER — Telehealth: Payer: Self-pay

## 2023-05-30 DIAGNOSIS — I82401 Acute embolism and thrombosis of unspecified deep veins of right lower extremity: Secondary | ICD-10-CM | POA: Diagnosis not present

## 2023-05-30 DIAGNOSIS — R2242 Localized swelling, mass and lump, left lower limb: Secondary | ICD-10-CM | POA: Diagnosis not present

## 2023-05-30 NOTE — Telephone Encounter (Signed)
Received VM from pt stating that she thinks she needs a doppler d/t her lt calf being hard and swollen.  Upon return call pt denies redness or temperature change but sudden onset of the edema and hardening. Doppler ordered per Dr Myna Hidalgo. Pt will contact imaging to schedule because she is going out of town later this week. No PA required per [place]. dph

## 2023-05-31 ENCOUNTER — Ambulatory Visit: Payer: PPO | Attending: Cardiology | Admitting: *Deleted

## 2023-05-31 DIAGNOSIS — Z5181 Encounter for therapeutic drug level monitoring: Secondary | ICD-10-CM | POA: Diagnosis not present

## 2023-05-31 DIAGNOSIS — I82401 Acute embolism and thrombosis of unspecified deep veins of right lower extremity: Secondary | ICD-10-CM | POA: Diagnosis not present

## 2023-05-31 LAB — POCT INR: INR: 1.7 — AB (ref 2.0–3.0)

## 2023-05-31 NOTE — Patient Instructions (Addendum)
Description   Today take 1.5 tablets of warfarin and tomorrow take 1 tablet of warfarin then START taking Warfarin 1 tablet daily except for 1/2 tablet on Sundays and Thursdays. Recheck INR 2 weeks. [facility] [phone]

## 2023-06-08 NOTE — Telephone Encounter (Signed)
Spoke with patient. She is scheduled to have yearly labs with PCP tomorrow. Requested patient have a copy faxed to our office for Dr. Edward Jolly to review. Patient agreeable and states she has office fax number.

## 2023-06-08 NOTE — Telephone Encounter (Signed)
Patient left voicemail on Triage line stating she is ready to schedule Prolia injection. Vitamin D was 39.0 on 05-06-23 and last calcium checked was 8.5 on 04-29-23. Please advise on calcium level and prolia.   Routing to provider for review.

## 2023-06-09 ENCOUNTER — Other Ambulatory Visit: Payer: Self-pay | Admitting: *Deleted

## 2023-06-09 DIAGNOSIS — I824Z2 Acute embolism and thrombosis of unspecified deep veins of left distal lower extremity: Secondary | ICD-10-CM

## 2023-06-09 DIAGNOSIS — E042 Nontoxic multinodular goiter: Secondary | ICD-10-CM | POA: Diagnosis not present

## 2023-06-09 DIAGNOSIS — E559 Vitamin D deficiency, unspecified: Secondary | ICD-10-CM | POA: Diagnosis not present

## 2023-06-09 DIAGNOSIS — Z0000 Encounter for general adult medical examination without abnormal findings: Secondary | ICD-10-CM | POA: Diagnosis not present

## 2023-06-09 DIAGNOSIS — M810 Age-related osteoporosis without current pathological fracture: Secondary | ICD-10-CM | POA: Diagnosis not present

## 2023-06-09 MED ORDER — WARFARIN SODIUM 1 MG PO TABS: ORAL_TABLET | ORAL | 1 refills | Status: DC

## 2023-06-09 NOTE — Telephone Encounter (Signed)
Warfarin 1mg  refill DVT, PE Last INR 05/31/23 Last OV 05/18/23

## 2023-06-13 ENCOUNTER — Other Ambulatory Visit: Payer: Self-pay | Admitting: Neurology

## 2023-06-13 ENCOUNTER — Telehealth: Payer: Self-pay | Admitting: *Deleted

## 2023-06-13 LAB — CMP 10231: EGFR: 80

## 2023-06-13 NOTE — Telephone Encounter (Signed)
Returned a call to the pt since she left a message to call her back to change her appointment. Will call back later and follow up.

## 2023-06-16 ENCOUNTER — Ambulatory Visit: Payer: PPO | Attending: Internal Medicine

## 2023-06-16 DIAGNOSIS — K589 Irritable bowel syndrome without diarrhea: Secondary | ICD-10-CM | POA: Diagnosis not present

## 2023-06-16 DIAGNOSIS — Z1331 Encounter for screening for depression: Secondary | ICD-10-CM | POA: Diagnosis not present

## 2023-06-16 DIAGNOSIS — G729 Myopathy, unspecified: Secondary | ICD-10-CM | POA: Diagnosis not present

## 2023-06-16 DIAGNOSIS — E042 Nontoxic multinodular goiter: Secondary | ICD-10-CM | POA: Diagnosis not present

## 2023-06-16 DIAGNOSIS — R911 Solitary pulmonary nodule: Secondary | ICD-10-CM | POA: Diagnosis not present

## 2023-06-16 DIAGNOSIS — R7303 Prediabetes: Secondary | ICD-10-CM | POA: Diagnosis not present

## 2023-06-16 DIAGNOSIS — F419 Anxiety disorder, unspecified: Secondary | ICD-10-CM | POA: Diagnosis not present

## 2023-06-16 DIAGNOSIS — M810 Age-related osteoporosis without current pathological fracture: Secondary | ICD-10-CM | POA: Diagnosis not present

## 2023-06-16 DIAGNOSIS — Z1339 Encounter for screening examination for other mental health and behavioral disorders: Secondary | ICD-10-CM | POA: Diagnosis not present

## 2023-06-16 DIAGNOSIS — Z5181 Encounter for therapeutic drug level monitoring: Secondary | ICD-10-CM | POA: Diagnosis not present

## 2023-06-16 DIAGNOSIS — R350 Frequency of micturition: Secondary | ICD-10-CM | POA: Diagnosis not present

## 2023-06-16 DIAGNOSIS — M797 Fibromyalgia: Secondary | ICD-10-CM | POA: Diagnosis not present

## 2023-06-16 DIAGNOSIS — Z0000 Encounter for general adult medical examination without abnormal findings: Secondary | ICD-10-CM | POA: Diagnosis not present

## 2023-06-16 DIAGNOSIS — E785 Hyperlipidemia, unspecified: Secondary | ICD-10-CM | POA: Diagnosis not present

## 2023-06-16 DIAGNOSIS — I700 Atherosclerosis of aorta: Secondary | ICD-10-CM | POA: Diagnosis not present

## 2023-06-16 DIAGNOSIS — F339 Major depressive disorder, recurrent, unspecified: Secondary | ICD-10-CM | POA: Diagnosis not present

## 2023-06-16 LAB — POCT INR: INR: 2.7 (ref 2.0–3.0)

## 2023-06-16 NOTE — Patient Instructions (Signed)
Description   Continue taking Warfarin 1 tablet daily except for 1/2 tablet on Sundays and Thursdays.  Recheck INR 4 weeks.  [facility] Clinic [phone]

## 2023-06-19 ENCOUNTER — Other Ambulatory Visit (HOSPITAL_COMMUNITY): Payer: Self-pay | Admitting: Internal Medicine

## 2023-06-19 DIAGNOSIS — R911 Solitary pulmonary nodule: Secondary | ICD-10-CM

## 2023-06-20 ENCOUNTER — Other Ambulatory Visit: Payer: Self-pay | Admitting: Obstetrics and Gynecology

## 2023-06-20 DIAGNOSIS — R7989 Other specified abnormal findings of blood chemistry: Secondary | ICD-10-CM

## 2023-06-20 NOTE — Telephone Encounter (Signed)
Patient may not proceed with Prolia until her calcium level is normal.  It is currently low. She will need to see her PCP for correction of this.

## 2023-06-20 NOTE — Telephone Encounter (Signed)
Call to patient. States she is only taking about 500mg  of calcium daily. RN advised recommended daily dose of calcium is 1200mg . Patient states she will start incorporating more calcium products into her diet and continue taking her calcium supplementation. Declines to schedule with PCP, states she does not want to get them involved. Wants to work on calcium intake and requests repeat blood work be done in our office prior to receiving prolia injection. RN advised would update Dr. Edward Jolly and return call with any recommendations. Patient agreeable.

## 2023-06-20 NOTE — Telephone Encounter (Signed)
Labs received from PCP- calcium was 8.7 on 06-09-23. Please advise if okay to proceed with prolia.

## 2023-06-21 ENCOUNTER — Ambulatory Visit: Payer: PPO | Admitting: Hematology & Oncology

## 2023-06-21 ENCOUNTER — Other Ambulatory Visit: Payer: PPO

## 2023-06-28 ENCOUNTER — Other Ambulatory Visit: Payer: Self-pay | Admitting: Behavioral Health

## 2023-06-28 DIAGNOSIS — F331 Major depressive disorder, recurrent, moderate: Secondary | ICD-10-CM

## 2023-06-28 DIAGNOSIS — F411 Generalized anxiety disorder: Secondary | ICD-10-CM

## 2023-07-05 ENCOUNTER — Ambulatory Visit (HOSPITAL_COMMUNITY): Payer: PPO

## 2023-07-05 ENCOUNTER — Other Ambulatory Visit (HOSPITAL_COMMUNITY)
Admission: RE | Admit: 2023-07-05 | Discharge: 2023-07-05 | Disposition: A | Discharge status: Home / Self Care | Payer: PPO | Source: Ambulatory Visit | Attending: Obstetrics and Gynecology | Admitting: Obstetrics and Gynecology

## 2023-07-05 ENCOUNTER — Ambulatory Visit (INDEPENDENT_AMBULATORY_CARE_PROVIDER_SITE_OTHER): Payer: PPO | Admitting: Obstetrics and Gynecology

## 2023-07-05 VITALS — BP 122/80 | HR 52 | Ht 62.0 in | Wt 123.0 lb

## 2023-07-05 DIAGNOSIS — N898 Other specified noninflammatory disorders of vagina: Secondary | ICD-10-CM | POA: Diagnosis not present

## 2023-07-05 DIAGNOSIS — K648 Other hemorrhoids: Secondary | ICD-10-CM | POA: Diagnosis not present

## 2023-07-05 DIAGNOSIS — R350 Frequency of micturition: Secondary | ICD-10-CM

## 2023-07-05 DIAGNOSIS — K625 Hemorrhage of anus and rectum: Secondary | ICD-10-CM | POA: Diagnosis not present

## 2023-07-05 NOTE — Progress Notes (Unsigned)
GYNECOLOGY  VISIT   HPI: 77 y.o.   Single  Caucasian female   G0P0 with No LMP recorded. Patient has had a hysterectomy.   here for: urinary frequency and large volume of urine--pt started new medication (abilify) and feels this is the reason.  Patient experienced depression due to losses.  Feeling good on Abilify.  She will be followed by her nurse practitioner at [facility].   Patient became concerned that increased urination was a potential sign of ovarian cancer.  States her sister had ovarian cancer.   She has experienced muscle stiffness and increased sweating.   Increased bowel movements.   She is having vaginal odor.   CT abdomen and pelvis 03/17/23 - showed possible low grade colitis.   Uterus absent and no adnexal pathology.  GYNECOLOGIC HISTORY: No LMP recorded. Patient has had a hysterectomy. Contraception:  hyst Menopausal hormone therapy:  n/a Last 2 paps:  09/28/22 neg: HR HPV neg,  09/27/21 benign glandular cells of the vagina: HR HPV neg: biopsy showed benign fibroepithelial polyp, 08/19/20 neg         History of abnormal Pap or positive HPV:  yes Mammogram:  02/20/23 Breast Density Category A, BI-RADS CATEGORY 1 Neg                OB History     Gravida  0   Para  0   Term      Preterm      AB      Living         SAB      IAB      Ectopic      Multiple      Live Births                 Patient Active Problem List   Diagnosis Date Noted   Major depressive disorder, recurrent severe without psychotic features (HCC) 04/29/2023   Major depressive disorder with current active episode 04/26/2023   Hyperlipidemia 02/16/2023   Atrial tachycardia (HCC) 11/16/2021   Chronic fatigue and malaise 11/16/2021   Encounter for therapeutic drug monitoring 02/02/2021   Acute deep vein thrombosis (DVT) of right lower extremity, unspecified vein (HCC) 01/25/2021   Aortic atherosclerosis (HCC) 11/17/2020   Acute DVT (deep venous thrombosis) (HCC)  11/13/2020   Depression with anxiety 11/13/2020   RLS (restless legs syndrome) 01/07/2020   Insomnia due to medical condition 01/07/2020   Nocturia more than twice per night 01/07/2020   Bradycardia 12/14/2017   Abnormal EKG    GERD (gastroesophageal reflux disease)    Dyspnea on exertion 10/30/2017   Family history of ovarian cancer 04/17/2017   Family history of breast cancer 04/17/2017   Family history of colon cancer 04/17/2017   Family history of pancreatic cancer 04/17/2017   Family history of melanoma 04/17/2017   Genetic testing 04/17/2017   CIN III (cervical intraepithelial neoplasia III)    Arthritis    Fibromyalgia    Chronic pulmonary embolism (HCC)    Thyroid disease    Osteoporosis    DVT (deep venous thrombosis) (HCC) 04/01/2004    Past Medical History:  Diagnosis Date   Abnormal EKG    Anxiety    Arthritis    oa   Bradycardia 12/14/2017   CIN III (cervical intraepithelial neoplasia III) 2000   Complication of anesthesia    did well last 2 times with procedures   Depression    DES exposure in utero  DVT (deep venous thrombosis) (HCC) 01/2009   LEFT LEG   DVT (deep venous thrombosis) (HCC) 10/30/2020   Dyspnea on exertion 10/2017   Elevated triglycerides with high cholesterol    Endometriosis    Fibromyalgia    GERD (gastroesophageal reflux disease)    History of colon polyps    History of hiatal hernia    told by some md has, some say not   IBS (irritable bowel syndrome) 2015   Left bundle branch block    intermittent   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  low TSH 0.22 on 03/24/23.   Vitamin D  deficiency     Past Surgical History:  Procedure Laterality Date   ABDOMINAL HYSTERECTOMY  2001   TAH, partial   APPENDECTOMY  1978   COLONOSCOPY WITH PROPOFOL N/A 12/07/2015   Procedure: COLONOSCOPY WITH PROPOFOL;  Surgeon: Charolett Bumpers, MD;  Location: [facility];  Service: Endoscopy;  Laterality: N/A;   COLONOSCOPY WITH PROPOFOL N/A 01/11/2022   Procedure: COLONOSCOPY WITH PROPOFOL;  Surgeon: Kerin Salen, MD;  Location: [facility];  Service: Gastroenterology;  Laterality: N/A;   colonscopy  7 yrs ago   other in past   ESOPHAGOGASTRODUODENOSCOPY (EGD) WITH PROPOFOL N/A 12/07/2015   Procedure: ESOPHAGOGASTRODUODENOSCOPY (EGD) WITH PROPOFOL;  Surgeon: Charolett Bumpers, MD;  Location: [facility];  Service: Endoscopy;  Laterality: N/A;   ESOPHAGOGASTRODUODENOSCOPY ENDOSCOPY  yrs ago   FACIAL COSMETIC SURGERY     GYNECOLOGIC CRYOSURGERY     HEMOSTASIS CLIP PLACEMENT  01/11/2022   Procedure: HEMOSTASIS CLIP PLACEMENT;  Surgeon: Kerin Salen, MD;  Location: [facility];  Service: Gastroenterology;;   HOT HEMOSTASIS N/A 01/11/2022   Procedure: HOT HEMOSTASIS (ARGON PLASMA COAGULATION/BICAP);  Surgeon: Kerin Salen, MD;  Location: [facility];  Service: Gastroenterology;  Laterality: N/A;   MYOMECTOMY     POLYPECTOMY  01/11/2022   Procedure: POLYPECTOMY;  Surgeon: Kerin Salen, MD;  Location: [facility];  Service: Gastroenterology;;   REPLACEMENT TOTAL JOINT WRIST W/ PROSTHETIC IMPLANT Right 08/01/2021   TONSILLECTOMY      Current Outpatient Medications  Medication Sig Dispense Refill   ARIPiprazole (ABILIFY) 2 MG tablet TAKE 1 TABLET BY MOUTH EVERY DAY 90 tablet 0   escitalopram (LEXAPRO) 20 MG tablet Take 1 tablet (20 mg total) by mouth daily. 90 tablet 1   Evolocumab (REPATHA SURECLICK) 140 MG/ML SOAJ Inject 140 mg into the skin every 14 (fourteen) days. 2 mL 11   rOPINIRole (REQUIP) 1 MG tablet TAKE 1 TABLET BY MOUTH AT BEDTIME. 90 tablet 1   Vitamin D, Ergocalciferol,  (DRISDOL) 1.25 MG (50000 UNIT) CAPS capsule Take 1 capsule (50,000 Units total) by mouth every 7 (seven) days. 4 capsule 0   warfarin (COUMADIN) 1 MG tablet Take 1/2 tablet to 1 tablet by mouth daily or as directed by Anticoagulation Clinic. 90 tablet 1   Current Facility-Administered Medications  Medication Dose Route Frequency Provider Last Rate Last Admin   denosumab (PROLIA) injection 60 mg  60 mg Subcutaneous Once Patton Salles, MD  ALLERGIES: Hydroxyzine and Sulfa antibiotics  Family History  Problem Relation Age of Onset   Depression Mother    Anxiety disorder Mother    Hypertension Mother    Breast cancer Mother        46's   Cancer Father        COLON   Cancer Sister        ovarian cancer   Depression Sister    Anxiety disorder Sister    Hypertension Sister    Stroke Sister    Breast cancer Maternal Aunt        Age 48's   Cancer Maternal Aunt        Melanoma   Alzheimer's disease Maternal Aunt    Breast cancer Maternal Aunt        70's   Cancer Maternal Aunt        Colon CA   Alzheimer's disease Maternal Aunt    Cancer Paternal Aunt        OVARIAN and COLON    Social History   Socioeconomic History   Marital status: Single    Spouse name: Not on file   Number of children: 0   Years of education: 16   Highest education level: Bachelor's degree (e.g., BA, AB, BS)  Occupational History   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  Tobacco Use   Smoking status: Former    Current packs/day: 0.00    Average packs/day: 0.1 packs/day for 6.0 years (0.6 ttl pk-yrs)    Types: Cigarettes    Start date: 08/01/1972    Quit date: 08/01/1978    Years since quitting: 44.9   Smokeless tobacco: Never   Tobacco comments:    only smoked 4-6 yrs 1 pack per month  Vaping Use   Vaping status: Never Used  Substance and Sexual Activity   Alcohol use: Yes    Comment: 1 drink per month   Drug use: No   Sexual activity: Not Currently    Birth  control/protection: Surgical    Comment: HYSTERECTOMY-1st intercourse 27-Fewer than 5 partners  Other Topics Concern   Not on file  Social History Narrative   Lives alone in [place].    Social Determinants of Health   Financial Resource Strain: Not on file  Food Insecurity: Food Insecurity Present (04/29/2023)   Hunger Vital Sign    Worried About Running Out of Food in the Last Year: Not on file    Ran Out of Food in the Last Year: Sometimes true  Transportation Needs: No Transportation Needs (04/29/2023)   PRAPARE - Administrator, Civil Service (Medical): No    Lack of Transportation (Non-Medical): No  Physical Activity: Not on file  Stress: Not on file  Social Connections: Not on file  Intimate Partner Violence: Not At Risk (04/29/2023)   Humiliation, Afraid, Rape, and Kick questionnaire    Fear of Current or Ex-Partner: No    Emotionally Abused: No    Physically Abused: No    Sexually Abused: No      PHYSICAL EXAMINATION:   BP 122/80 (BP Location: Left Arm, Patient Position: Sitting, Cuff Size: Normal)   Pulse (!) 52   Ht 5\' 2"  (1.575 m)   Wt 123 lb (55.8 kg)   SpO2 95%   BMI 22.50 kg/m     General appearance: alert, cooperative and appears stated age   Pelvic: External genitalia:  no lesions  Urethra:  normal appearing urethra with no masses, tenderness or lesions              Bartholins and Skenes: normal                 Vagina: normal appearing vagina with normal color and discharge, no lesions              Cervix: no lesions                Bimanual Exam:  Uterus:  normal size, contour, position, consistency, mobility, non-tender              Adnexa: no mass, fullness, tenderness              Rectal exam: Yes.  .  Confirms.              Anus:  normal sphincter tone, no lesions  Chaperone was present for exam:  Warren Lacy, CMA  ASSESSMENT: Urinary frequency on Abilify.  Urinary frequency.  FH ovarian cancer in sister. CT  abdomen/pelvis no adnexal pathology  PLAN: UA:  sg 1.020, ph 6.0, 0 - 5 WBC, 0 - 2 RBC, 0 - 5 squams. UC sent.  Vaginitis swab.  Return for breast and pelvic exam in March. Will do CA125 then per patient preference.  She will reach out to her psychiatry practitioner regarding her side effects from Abilify.     22 min  total time was spent for this patient encounter, including preparation, face-to-face counseling with the patient, coordination of care, and documentation of the encounter.

## 2023-07-06 ENCOUNTER — Encounter: Payer: Self-pay | Admitting: Obstetrics and Gynecology

## 2023-07-06 LAB — URINALYSIS, COMPLETE W/RFL CULTURE
Bacteria, UA: NONE SEEN /HPF
Bilirubin Urine: NEGATIVE
Glucose, UA: NEGATIVE
Hyaline Cast: NONE SEEN /[LPF]
Ketones, ur: NEGATIVE
Leukocyte Esterase: NEGATIVE
Nitrites, Initial: NEGATIVE
Protein, ur: NEGATIVE
Specific Gravity, Urine: 1.02 (ref 1.001–1.035)
pH: 6 (ref 5.0–8.0)

## 2023-07-06 LAB — URINE CULTURE
MICRO NUMBER:: 15807862
Result:: NO GROWTH
SPECIMEN QUALITY:: ADEQUATE

## 2023-07-06 LAB — CULTURE INDICATED

## 2023-07-07 LAB — CERVICOVAGINAL ANCILLARY ONLY
Bacterial Vaginitis (gardnerella): NEGATIVE
Candida Glabrata: NEGATIVE
Candida Vaginitis: NEGATIVE
Comment: NEGATIVE
Comment: NEGATIVE
Comment: NEGATIVE
Comment: NEGATIVE
Trichomonas: NEGATIVE

## 2023-07-10 ENCOUNTER — Encounter: Payer: Self-pay | Admitting: Hematology & Oncology

## 2023-07-13 ENCOUNTER — Other Ambulatory Visit: Payer: Self-pay

## 2023-07-13 DIAGNOSIS — I82401 Acute embolism and thrombosis of unspecified deep veins of right lower extremity: Secondary | ICD-10-CM

## 2023-07-13 DIAGNOSIS — D069 Carcinoma in situ of cervix, unspecified: Secondary | ICD-10-CM

## 2023-07-14 ENCOUNTER — Ambulatory Visit: Payer: PPO | Attending: Cardiology

## 2023-07-14 ENCOUNTER — Encounter: Payer: Self-pay | Admitting: Hematology & Oncology

## 2023-07-14 ENCOUNTER — Inpatient Hospital Stay: Payer: PPO | Admitting: Hematology & Oncology

## 2023-07-14 ENCOUNTER — Inpatient Hospital Stay: Payer: PPO | Attending: Hematology & Oncology

## 2023-07-14 VITALS — BP 111/49 | HR 50 | Temp 98.0°F | Resp 18 | Ht 62.0 in | Wt 125.0 lb

## 2023-07-14 DIAGNOSIS — I82401 Acute embolism and thrombosis of unspecified deep veins of right lower extremity: Secondary | ICD-10-CM

## 2023-07-14 DIAGNOSIS — Z7901 Long term (current) use of anticoagulants: Secondary | ICD-10-CM | POA: Insufficient documentation

## 2023-07-14 DIAGNOSIS — D069 Carcinoma in situ of cervix, unspecified: Secondary | ICD-10-CM

## 2023-07-14 DIAGNOSIS — I82402 Acute embolism and thrombosis of unspecified deep veins of left lower extremity: Secondary | ICD-10-CM | POA: Diagnosis not present

## 2023-07-14 DIAGNOSIS — D6862 Lupus anticoagulant syndrome: Secondary | ICD-10-CM | POA: Insufficient documentation

## 2023-07-14 DIAGNOSIS — Z5181 Encounter for therapeutic drug level monitoring: Secondary | ICD-10-CM | POA: Diagnosis not present

## 2023-07-14 LAB — CBC WITH DIFFERENTIAL (CANCER CENTER ONLY)
Abs Immature Granulocytes: 0.03 10*3/uL (ref 0.00–0.07)
Basophils Absolute: 0.1 10*3/uL (ref 0.0–0.1)
Basophils Relative: 1 %
Eosinophils Absolute: 0.3 10*3/uL (ref 0.0–0.5)
Eosinophils Relative: 6 %
HCT: 42 % (ref 36.0–46.0)
Hemoglobin: 13.8 g/dL (ref 12.0–15.0)
Immature Granulocytes: 1 %
Lymphocytes Relative: 28 %
Lymphs Abs: 1.6 10*3/uL (ref 0.7–4.0)
MCH: 31.2 pg (ref 26.0–34.0)
MCHC: 32.9 g/dL (ref 30.0–36.0)
MCV: 95 fL (ref 80.0–100.0)
Monocytes Absolute: 0.5 10*3/uL (ref 0.1–1.0)
Monocytes Relative: 8 %
Neutro Abs: 3.2 10*3/uL (ref 1.7–7.7)
Neutrophils Relative %: 56 %
Platelet Count: 220 10*3/uL (ref 150–400)
RBC: 4.42 MIL/uL (ref 3.87–5.11)
RDW: 12.8 % (ref 11.5–15.5)
WBC Count: 5.7 10*3/uL (ref 4.0–10.5)
nRBC: 0 % (ref 0.0–0.2)

## 2023-07-14 LAB — CMP (CANCER CENTER ONLY)
ALT: 14 U/L (ref 0–44)
AST: 19 U/L (ref 15–41)
Albumin: 3.7 g/dL (ref 3.5–5.0)
Alkaline Phosphatase: 83 U/L (ref 38–126)
Anion gap: 5 (ref 5–15)
BUN: 18 mg/dL (ref 8–23)
CO2: 33 mmol/L — ABNORMAL HIGH (ref 22–32)
Calcium: 9.2 mg/dL (ref 8.9–10.3)
Chloride: 105 mmol/L (ref 98–111)
Creatinine: 0.8 mg/dL (ref 0.44–1.00)
GFR, Estimated: >60 mL/min (ref >60)
Glucose, Bld: 88 mg/dL (ref 70–99)
Potassium: 5.2 mmol/L — ABNORMAL HIGH (ref 3.5–5.1)
Sodium: 143 mmol/L (ref 135–145)
Total Bilirubin: 0.5 mg/dL (ref <1.2)
Total Protein: 6.6 g/dL (ref 6.5–8.1)

## 2023-07-14 LAB — POCT INR: INR: 3.2 — AB (ref 2.0–3.0)

## 2023-07-14 NOTE — Progress Notes (Signed)
Hematology and Oncology Follow Up Visit  Emma Lopez [id number] 1946-06-09 77 y.o. 07/14/2023   Principle Diagnosis:  Recurrent DVT of the left leg -- probable progression Positive lupus anticoagulant   Past Therapy: Xarelto 20 mg p.o. daily -- d/c on 01/06/2021 Pradaxa 150 mg po BID - start on 01/06/2021 - d/c on 01/25/2021   Current Therapy:        Coumadin  -- dosed by Coumadin Clinic -- keep INR 2.5-3.5   Interim History:  Emma Lopez is here today for follow-up I am absolutely surprised at what she told me.  She says she was admitted to the Behavior[facility] Unit at [facility].  This was nothing back in October.  She had depression that was really bad.  She looks good right now.  She feels better.  She is back on Lexapro.  With having Abilify added has really helped her she feels.  I do feel bad for her that she lost her cat.  He had congestive heart failure.  She is not sure she will ever get another 1..  [place]  She is going to try to get into [facility].  She did have some pain with the left leg.  We did do a Doppler.  She had a chronic thrombus but nothing that was acute..  She is doing well on the Coumadin.  She has INR managed by the Coumadin Clinic.  She has had no issues with nausea or vomiting.  She had lost weight.  She is trying to gain some back now..  She has had no chest wall pain.  She has had no cough.  Is been no headache.  She has had no change in bowel or bladder habits.  Overall, I would say that her performance status is probably ECOG 1..   Medications:  Allergies as of 07/14/2023       Reactions   Hydroxyzine Other (See Comments)   Vertigo   Sulfa Antibiotics Rash        Medication List        Accurate as of July 14, 2023 11:29 AM. If you have any questions, ask your nurse or doctor.          STOP taking these medications    Vitamin D (Ergocalciferol) 1.25 MG (50000 UNIT) Caps  capsule Commonly known as: DRISDOL Stopped by: Josph Macho       TAKE these medications    ARIPiprazole 2 MG tablet Commonly known as: ABILIFY TAKE 1 TABLET BY MOUTH EVERY DAY   escitalopram 20 MG tablet Commonly known as: LEXAPRO Take 1 tablet (20 mg total) by mouth daily.   Repatha SureClick 140 MG/ML Soaj Generic drug: Evolocumab Inject 140 mg into the skin every 14 (fourteen) days.   rOPINIRole 1 MG tablet Commonly known as: REQUIP TAKE 1 TABLET BY MOUTH AT BEDTIME.   warfarin 1 MG tablet Commonly known as: COUMADIN Take as directed by the anticoagulation clinic. If you are unsure how to take this medication, talk to your nurse or doctor. Original instructions: Take 1/2 tablet to 1 tablet by mouth daily or as directed by Anticoagulation Clinic.        Allergies:  Allergies  Allergen Reactions   Hydroxyzine Other (See Comments)    Vertigo    Sulfa Antibiotics Rash    Past Medical History, Surgical history, Social history, and Family History were reviewed and updated.  Review of Systems: Review  of Systems  Constitutional: Negative.   HENT: Negative.    Eyes: Negative.   Respiratory: Negative.    Cardiovascular:  Positive for leg swelling.  Gastrointestinal: Negative.   Genitourinary: Negative.   Musculoskeletal: Negative.   Skin: Negative.   Neurological: Negative.   Endo/Heme/Allergies: Negative.   Psychiatric/Behavioral:  Positive for depression.      Physical Exam:  height is 5\' 2"  (1.575 m) and weight is 125 lb (56.7 kg). Her oral temperature is 98 F (36.7 C). Her blood pressure is 111/49 (abnormal) and her pulse is 50 (abnormal). Her respiration is 18 and oxygen saturation is 98%.   Wt Readings from Last 3 Encounters:  07/14/23 125 lb (56.7 kg)  07/05/23 123 lb (55.8 kg)  05/18/23 117 lb 3.2 oz (53.2 kg)   Physical Exam Vitals reviewed.  HENT:     Head: Normocephalic and atraumatic.  Eyes:     Pupils: Pupils are equal, round, and  reactive to light.  Cardiovascular:     Rate and Rhythm: Normal rate and regular rhythm.     Heart sounds: Normal heart sounds.  Pulmonary:     Effort: Pulmonary effort is normal.     Breath sounds: Normal breath sounds.  Abdominal:     General: Bowel sounds are normal.     Palpations: Abdomen is soft.  Musculoskeletal:        General: No tenderness or deformity. Normal range of motion.     Cervical back: Normal range of motion.     Comments: Lower extremities does show a little bit of swelling in the left lower leg.  No venous cord is noted.  She has a negative Homans' sign.  Right leg is unremarkable.  Lymphadenopathy:     Cervical: No cervical adenopathy.  Skin:    General: Skin is warm and dry.     Findings: No erythema or rash.  Neurological:     Mental Status: She is alert and oriented to person, place, and time.  Psychiatric:        Behavior: Behavior normal.        Thought Content: Thought content normal.        Judgment: Judgment normal.       Lab Results  Component Value Date   WBC 5.7 07/14/2023   HGB 13.8 07/14/2023   HCT 42.0 07/14/2023   MCV 95.0 07/14/2023   PLT 220 07/14/2023   Lab Results  Component Value Date   FERRITIN 69 07/05/2021   IRON 111 12/04/2019   TIBC 351 12/04/2019   UIBC 240 12/04/2019   IRONPCTSAT 32 12/04/2019   Lab Results  Component Value Date   RBC 4.42 07/14/2023   No results found for: "KPAFRELGTCHN", "LAMBDASER", "KAPLAMBRATIO" No results found for: "IGGSERUM", "IGA", "IGMSERUM" No results found for: "TOTALPROTELP", "ALBUMINELP", "A1GS", "A2GS", "BETS", "BETA2SER", "GAMS", "MSPIKE", "SPEI"   Chemistry      Component Value Date/Time   NA 143 07/14/2023 1033   NA 142 01/03/2018 1601   K 5.2 (H) 07/14/2023 1033   CL 105 07/14/2023 1033   CO2 33 (H) 07/14/2023 1033   BUN 18 07/14/2023 1033   BUN 20 01/03/2018 1601   CREATININE 0.80 07/14/2023 1033      Component Value Date/Time   CALCIUM 9.2 07/14/2023 1033   ALKPHOS  83 07/14/2023 1033   AST 19 07/14/2023 1033   ALT 14 07/14/2023 1033   BILITOT 0.5 07/14/2023 1033       Impression and Plan: Emma Lopez is  a very pleasant 77 yo caucasian female with history of recurrent DVT and positive lupus anticoagulant.   Again, I am absolutely stone that she had issue with the bad depression.  I am so glad that she is feeling better.  She is doing well with respect to the Coumadin.  We will see what the INR is.  I told her that she will always have this chronic thrombus.  We will plan to see her back probably about 4 months. Josph Macho, MD 12/13/202411:29 AM

## 2023-07-14 NOTE — Patient Instructions (Signed)
Continue taking Warfarin 1 tablet daily except for 1/2 tablet on Sundays and Thursdays.  Recheck INR 6 weeks.  Coumadin Clinic [phone]

## 2023-07-18 ENCOUNTER — Encounter: Payer: Self-pay | Admitting: Behavioral Health

## 2023-07-18 ENCOUNTER — Ambulatory Visit: Payer: PPO | Admitting: Behavioral Health

## 2023-07-18 DIAGNOSIS — F331 Major depressive disorder, recurrent, moderate: Secondary | ICD-10-CM

## 2023-07-18 DIAGNOSIS — F411 Generalized anxiety disorder: Secondary | ICD-10-CM | POA: Diagnosis not present

## 2023-07-18 DIAGNOSIS — R5381 Other malaise: Secondary | ICD-10-CM

## 2023-07-18 DIAGNOSIS — R4589 Other symptoms and signs involving emotional state: Secondary | ICD-10-CM

## 2023-07-18 NOTE — Progress Notes (Signed)
[facility]  Patient ID: Emma Lopez,  MRN: [id number]  PCP: Melida Quitter, MD  Date of Evaluation: 07/18/2023 Time spent:30 minutes  Chief Complaint:  Chief Complaint   Anxiety; Depression; Follow-up; Medication Refill; Patient Education; Stress     HISTORY/CURRENT STATUS: HPI  "[name]", 77 year old female presents to this office for follow up and medication management. Collateral information should be considered reliable.  Report overall improvement with anxiety and depression but still having fatigue. Not as bad as before but questions medication as cause.   She reports depression today at 3/10, and anxiety at 2/10.  She is sleeping 7 to 8 hours per night. PHQ-2 was negative.  She denies any history of mania, no psychosis, no auditory or visual hallucinations, no delirium.  Denies suicidal ideation or homicidal ideation. Pt feels safe and verbally contracts for safety.    Past psychiatric medication trials:  Lexapro Cymbalta BuSpar Viibryd  Zoloft   Individual Medical History/ Review of Systems: Changes? :No   Allergies: Hydroxyzine and Sulfa antibiotics  Current Medications:  Current Outpatient Medications:    ARIPiprazole (ABILIFY) 2 MG tablet, TAKE 1 TABLET BY MOUTH EVERY DAY, Disp: 90 tablet, Rfl: 0   escitalopram (LEXAPRO) 20 MG tablet, Take 1 tablet (20 mg total) by mouth daily., Disp: 90 tablet, Rfl: 1   Evolocumab (REPATHA SURECLICK) 140 MG/ML SOAJ, Inject 140 mg into the skin every 14 (fourteen) days., Disp: 2 mL, Rfl: 11   rOPINIRole (REQUIP) 1 MG tablet, TAKE 1 TABLET BY MOUTH AT BEDTIME., Disp: 90 tablet, Rfl: 1   warfarin (COUMADIN) 1 MG tablet, Take 1/2 tablet to 1 tablet by mouth daily or as directed by Anticoagulation Clinic., Disp: 90 tablet, Rfl: 1  Current Facility-Administered Medications:    denosumab (PROLIA) injection 60 mg, 60 mg, Subcutaneous, Once, [name], MD Medication Side Effects: none  Family Medical/  Social History: Changes? No  MENTAL HEALTH EXAM:  There were no vitals taken for this visit.There is no height or weight on file to calculate BMI.  General Appearance: Casual, Neat, and Obese  Eye Contact:  Good  Speech:  Clear and Coherent  Volume:  Normal  Mood:  Anxious, Depressed, and Dysphoric  Affect:  Appropriate  Thought Process:  Coherent  Orientation:  Full (Time, Place, and Person)  Thought Content: Logical   Suicidal Thoughts:  No  Homicidal Thoughts:  No  Memory:  WNL  Judgement:  Good  Insight:  Good  Psychomotor Activity:  Normal  Concentration:  Concentration: Good  Recall:  Good  Fund of Knowledge: Good  Language: Good  Assets:  Desire for Improvement  ADL's:  Intact  Cognition: WNL  Prognosis:  Good    DIAGNOSES:    ICD-10-CM   1. Generalized anxiety disorder  F41.1     2. Major depressive disorder, recurrent episode, moderate (HCC)  F33.1     3. Chronic fatigue and malaise  R53.82    R53.81     4. Loneliness  R45.89       Receiving Psychotherapy: No    RECOMMENDATIONS:  Greater than 50% of 30 min face to face time with patient was spent on counseling and coordination of care. Still feeling overall improvement since starting Abilify. Still complains of chronic fatigue but was complaining of this for many months prior to starting the Abilify. She has a tendency to stop medication when she feels is causing fatigue. I reinforced belief that medication was not the source since it  is a little less than before. She agrees to continue as prescribed and will re assess in two months. We discussed her desire to stop coming in for appointments and I explained that these medications require supervision.    Recommended Mag Glycinate supplement at bedtime.    We agreed to:   To continue with Lexapro 20 mg daily To continue with Abilify 2 mg daily at bedtime Will report side effects or worsening symptoms promptly Will follow-up in 8 weeks to reassess per  pt Provided emergency contact information Reviewed PDMP  Joan Flores, NP

## 2023-07-19 DIAGNOSIS — H04123 Dry eye syndrome of bilateral lacrimal glands: Secondary | ICD-10-CM | POA: Diagnosis not present

## 2023-07-19 DIAGNOSIS — H10413 Chronic giant papillary conjunctivitis, bilateral: Secondary | ICD-10-CM | POA: Diagnosis not present

## 2023-07-19 DIAGNOSIS — H2513 Age-related nuclear cataract, bilateral: Secondary | ICD-10-CM | POA: Diagnosis not present

## 2023-07-28 ENCOUNTER — Telehealth: Payer: Self-pay | Admitting: Behavioral Health

## 2023-07-28 NOTE — Telephone Encounter (Signed)
LVM to RC 

## 2023-07-28 NOTE — Telephone Encounter (Signed)
Pt called at 11:31a stating she is having side effects from medication.  She wanted to have a phone call with Arlys John, but I told her I could get a message to the nurse.  She didn't want to go in to specifics.  She said she will be available at 1pm or after 3pm today for a call back.  Next appt 2/17

## 2023-07-31 NOTE — Telephone Encounter (Signed)
Left second VM to RC.  

## 2023-08-03 NOTE — Telephone Encounter (Signed)
 Left a third VM and also sent her a MyChart message.

## 2023-08-03 NOTE — Telephone Encounter (Signed)
 Patient is on Lexapro  and Abilify . She was taking both in the AM, as that is what they did when she was hospitalized. She is c/o sleepiness and HA. She knows the HA could be from another issue. She said you recommended she get massages and neuro recommended PT. She said she hasn't had time to do either right now. She said the HA aren't awful, but are a nuisance. She said she tried taking both Lexapro  and Abilify  at night and feels like that has helped some with the sleepiness, but wanted to know if ok to take Lexapro  in the AM and Abilify  iin the PM to see if that helped even more. She is concerned about taking too many medications together. She takes Requip  at night.

## 2023-08-04 NOTE — Telephone Encounter (Signed)
 Patient notified of recommendation in regards to timing of medications.

## 2023-08-11 ENCOUNTER — Telehealth: Payer: Self-pay | Admitting: Pharmacy Technician

## 2023-08-11 ENCOUNTER — Other Ambulatory Visit (HOSPITAL_COMMUNITY): Payer: Self-pay

## 2023-08-11 ENCOUNTER — Telehealth: Payer: Self-pay | Admitting: Pharmacist

## 2023-08-11 NOTE — Telephone Encounter (Signed)
 Patient called stating she needs a new PA on her Repatha.

## 2023-08-11 NOTE — Telephone Encounter (Signed)
 Pharmacy Patient Advocate Encounter   Received notification from Pt Calls Messages that prior authorization for repatha  is required/requested.   Insurance verification completed.   The patient is insured through [facility] ADVANTAGE/RX ADVANCE .   Per test claim: The current 08/11/23 day co-pay is, $47.00 one month.  No PA needed at this time. This test claim was processed through [facility]- copay amounts may vary at other pharmacies due to pharmacy/plan contracts, or as the patient moves through the different stages of their insurance plan.

## 2023-08-15 ENCOUNTER — Telehealth: Payer: Self-pay | Admitting: Behavioral Health

## 2023-08-15 NOTE — Telephone Encounter (Signed)
 PT lvm that she feels like she needs a med adjustment. Please call her at [phone]

## 2023-08-16 NOTE — Telephone Encounter (Signed)
 Emma Lopez called back to give her land line at home [phone]

## 2023-08-16 NOTE — Telephone Encounter (Signed)
 LVM to [facility]

## 2023-08-16 NOTE — Telephone Encounter (Signed)
 Patient taking Lexapro  20 mg daily and  Abilify  2 mg daily at bedtime.  She reports that the Abilify  2 mg was beneficial initially, but feels like she needs to increase dose. She reports not feeling as "perky" as she was and a little more depressed.  She is sleeping well because she takes Requip  for RLS and that makes her sleepy.  She is forcing herself to do things, but since she lives by herself if she doesn't want to do something she doesn't need to.  She said she had plenty 2 mg tablets.  She has FU 2/17.

## 2023-08-17 MED ORDER — ARIPIPRAZOLE 5 MG PO TABS: 5.0000 mg | ORAL_TABLET | Freq: Every day | ORAL | 1 refills | Status: DC

## 2023-08-17 NOTE — Addendum Note (Signed)
Addended by: Karin Lieu T on: 08/17/2023 08:53 AM   Modules accepted: Orders

## 2023-08-17 NOTE — Telephone Encounter (Signed)
Notified patient of Rx for 5 mg Abilify.

## 2023-08-25 ENCOUNTER — Ambulatory Visit: Payer: PPO | Attending: Internal Medicine

## 2023-08-25 DIAGNOSIS — I82401 Acute embolism and thrombosis of unspecified deep veins of right lower extremity: Secondary | ICD-10-CM

## 2023-08-25 DIAGNOSIS — Z5181 Encounter for therapeutic drug level monitoring: Secondary | ICD-10-CM | POA: Diagnosis not present

## 2023-08-25 LAB — POCT INR: INR: 2.8 (ref 2.0–3.0)

## 2023-08-25 NOTE — Patient Instructions (Signed)
Continue taking Warfarin 1 tablet daily except for 1/2 tablet on Sundays and Thursdays.  Recheck INR 6 weeks.  [facility] [phone]

## 2023-08-28 ENCOUNTER — Telehealth: Payer: Self-pay | Admitting: Pharmacy Technician

## 2023-08-28 ENCOUNTER — Other Ambulatory Visit (HOSPITAL_COMMUNITY): Payer: Self-pay

## 2023-08-28 NOTE — Telephone Encounter (Signed)
Pharmacy Patient Advocate Encounter  Received notification from [facility] ADVANTAGE/RX ADVANCE that Prior Authorization for repatha has been APPROVED from 08/28/23 to 08/27/24   PA #/Case ID/Reference #: [id number]

## 2023-08-28 NOTE — Telephone Encounter (Signed)
Pharmacy Patient Advocate Encounter   Received notification from CoverMyMeds that prior authorization for repatha is required/requested.   Insurance verification completed.   The patient is insured through [facility] ADVANTAGE/RX ADVANCE .   Per test claim: PA required; PA submitted to above mentioned insurance via CoverMyMeds Key/confirmation #/EOC [facility] Status is pending

## 2023-09-06 ENCOUNTER — Ambulatory Visit (HOSPITAL_COMMUNITY)
Admission: RE | Admit: 2023-09-06 | Discharge: 2023-09-06 | Disposition: A | Discharge status: Home / Self Care | Payer: PPO | Source: Ambulatory Visit | Attending: Internal Medicine | Admitting: Internal Medicine

## 2023-09-06 ENCOUNTER — Other Ambulatory Visit: Payer: Self-pay | Admitting: Behavioral Health

## 2023-09-06 ENCOUNTER — Other Ambulatory Visit: Payer: Self-pay | Admitting: Cardiology

## 2023-09-06 DIAGNOSIS — R911 Solitary pulmonary nodule: Secondary | ICD-10-CM | POA: Diagnosis present

## 2023-09-06 DIAGNOSIS — I824Z2 Acute embolism and thrombosis of unspecified deep veins of left distal lower extremity: Secondary | ICD-10-CM

## 2023-09-06 NOTE — Telephone Encounter (Signed)
 Prescription refill request received for warfarin Lov: 05/18/23 Emma Lopez)  Next INR check: 10/06/23 Warfarin tablet strength: 1mg   Appropriate dose. Refill sent.

## 2023-09-09 ENCOUNTER — Other Ambulatory Visit: Payer: Self-pay | Admitting: Behavioral Health

## 2023-09-18 ENCOUNTER — Ambulatory Visit: Payer: PPO | Admitting: Behavioral Health

## 2023-09-18 ENCOUNTER — Encounter: Payer: Self-pay | Admitting: Behavioral Health

## 2023-09-18 DIAGNOSIS — F331 Major depressive disorder, recurrent, moderate: Secondary | ICD-10-CM

## 2023-09-18 DIAGNOSIS — F411 Generalized anxiety disorder: Secondary | ICD-10-CM | POA: Diagnosis not present

## 2023-09-18 DIAGNOSIS — R5381 Other malaise: Secondary | ICD-10-CM

## 2023-09-18 DIAGNOSIS — R4589 Other symptoms and signs involving emotional state: Secondary | ICD-10-CM | POA: Diagnosis not present

## 2023-09-18 NOTE — Progress Notes (Signed)
 [facility]  Patient ID: EVIN CHIRCO,  MRN: [id number]  PCP: Melida Quitter, MD  Date of Evaluation: 09/18/2023 Time spent:40 minutes  Chief Complaint:  Chief Complaint   Anxiety; Depression; Follow-up; Patient Education; Medication Refill     HISTORY/CURRENT STATUS: HPI  "[name]", 78 year old female presents to this office for follow up and medication management. Collateral information should be considered reliable.  Says she was worried about Abilify causing hot flashes after reading about the medication so she stopped taking it. She admits to reading too much and getting fixated and frustrated. Anxiety, depression, have worsened again.  Not as bad as before but questions medication as cause.   She reports depression today at 4/10, and anxiety at 3/10.  She is sleeping 7 to 8 hours per night.  She denies any history of mania, no psychosis, no auditory or visual hallucinations, no delirium.  Denies suicidal ideation or homicidal ideation. Pt feels safe and verbally contracts for safety.    Past psychiatric medication trials:  Lexapro Cymbalta BuSpar Viibryd  Zoloft  Individual Medical History/ Review of Systems: Changes? :No   Allergies: Hydroxyzine and Sulfa antibiotics  Current Medications:  Current Outpatient Medications:    ARIPiprazole (ABILIFY) 2 MG tablet, TAKE 1 TABLET BY MOUTH EVERY DAY, Disp: 90 tablet, Rfl: 0   ARIPiprazole (ABILIFY) 5 MG tablet, Take 1 tablet (5 mg total) by mouth daily., Disp: 30 tablet, Rfl: 1   escitalopram (LEXAPRO) 20 MG tablet, Take 1 tablet (20 mg total) by mouth daily., Disp: 90 tablet, Rfl: 1   Evolocumab (REPATHA SURECLICK) 140 MG/ML SOAJ, Inject 140 mg into the skin every 14 (fourteen) days., Disp: 2 mL, Rfl: 11   rOPINIRole (REQUIP) 1 MG tablet, TAKE 1 TABLET BY MOUTH AT BEDTIME., Disp: 90 tablet, Rfl: 1   warfarin (COUMADIN) 1 MG tablet, TAKE ONE-HALF TABLET TO 1 TABLET BY MOUTH DAILY OR AS DIRECTED BY ANTICOAGULATION  CLINIC., Disp: 75 tablet, Rfl: 0  Current Facility-Administered Medications:    denosumab (PROLIA) injection 60 mg, 60 mg, Subcutaneous, Once, [name], MD Medication Side Effects: none  Family Medical/ Social History: Changes? No  MENTAL HEALTH EXAM:  There were no vitals taken for this visit.There is no height or weight on file to calculate BMI.  General Appearance: Casual  Eye Contact:  Good  Speech:  Clear and Coherent  Volume:  Normal  Mood:  NA  Affect:  Appropriate  Thought Process:  Coherent  Orientation:  Full (Time, Place, and Person)  Thought Content: Rumination   Suicidal Thoughts:  No  Homicidal Thoughts:  No  Memory:  WNL  Judgement:  Good  Insight:  Good  Psychomotor Activity:  Normal  Concentration:  Concentration: Good  Recall:  Good  Fund of Knowledge: Good  Language: Good  Assets:  Desire for Improvement  ADL's:  Intact  Cognition: WNL  Prognosis:  Good    DIAGNOSES:    ICD-10-CM   1. Major depressive disorder, recurrent episode, moderate (HCC)  F33.1     2. Generalized anxiety disorder  F41.1     3. Chronic fatigue and malaise  R53.82    R53.81     4. Loneliness  R45.89       Receiving Psychotherapy: No    RECOMMENDATIONS:   Greater than 50% of 40  min face to face time with patient was spent on counseling and coordination of care.  Still complains of chronic fatigue but was complaining of this for  many months prior to starting the Abilify. Was complaining of hot flashes before Abilify but goes online and convinces herself Abilify might be the cause. She disagrees with her Endocrinologist and thinks she has thyroid problem. I explained that they interpret her labs and have expertise. She is hyper fixated on hot flashes driving her crazy. Frustrated because no one can give her answers on her chronic fatigue.  Googling  various dx and wanted to know if she could have had NMS. I counseled her that she was becoming obsessive  compulsive with trying to find any diagnosis that could cause her symptoms. She presented much better while taking Abilify and encouraged her to restart the medication. She agreed but wants to consult also with naturopathic medicine.      She has a tendency to stop medication when she feels is causing fatigue. I reinforced belief that medication was not the source since it is a little less than before. She agrees to continue as prescribed and will re assess in two months. We discussed her desire to stop coming in for appointments and I explained that these medications require supervision.    Recommended Mag Threonate supplement at bedtime.    We agreed to:   To continue with Lexapro 20 mg daily To restart with Abilify 2 mg daily at bedtime for 5 days, then 5 mg daily at bedtime. Will report side effects or worsening symptoms promptly Will follow-up in 8 weeks to reassess per pt Provided emergency contact information Reviewed PDMP  Joan Flores, NP

## 2023-10-04 DIAGNOSIS — J069 Acute upper respiratory infection, unspecified: Secondary | ICD-10-CM | POA: Diagnosis not present

## 2023-10-04 DIAGNOSIS — R0789 Other chest pain: Secondary | ICD-10-CM | POA: Diagnosis not present

## 2023-10-04 DIAGNOSIS — R051 Acute cough: Secondary | ICD-10-CM | POA: Diagnosis not present

## 2023-10-04 DIAGNOSIS — R61 Generalized hyperhidrosis: Secondary | ICD-10-CM | POA: Diagnosis not present

## 2023-10-06 ENCOUNTER — Ambulatory Visit: Payer: PPO | Attending: Cardiology

## 2023-10-06 DIAGNOSIS — Z5181 Encounter for therapeutic drug level monitoring: Secondary | ICD-10-CM | POA: Diagnosis not present

## 2023-10-06 LAB — POCT INR: INR: 4.6 — AB (ref 2.0–3.0)

## 2023-10-06 NOTE — Patient Instructions (Signed)
 Description   HOLD today's dose and only take 1/2 tablet tomorrow and then continue taking Warfarin 1 tablet daily except for 1/2 tablet on Sundays and Thursdays.  Recheck INR 2 weeks.  [facility] [phone]

## 2023-10-12 ENCOUNTER — Encounter: Payer: PPO | Admitting: Psychology

## 2023-10-20 ENCOUNTER — Ambulatory Visit: Attending: Cardiology

## 2023-10-20 DIAGNOSIS — Z5181 Encounter for therapeutic drug level monitoring: Secondary | ICD-10-CM | POA: Diagnosis not present

## 2023-10-20 LAB — POCT INR: INR: 1.8 — AB (ref 2.0–3.0)

## 2023-10-20 NOTE — Patient Instructions (Signed)
 Description   Take 1.5 tablets today and 1.5 tablets tomorrow and then continue taking Warfarin 1 tablet daily except for 1/2 tablet on Sundays and Thursdays.  Recheck INR 2 weeks.  [facility] [phone]

## 2023-10-23 ENCOUNTER — Encounter: Payer: Self-pay | Admitting: Obstetrics and Gynecology

## 2023-10-23 ENCOUNTER — Encounter: Payer: Self-pay | Admitting: Hematology & Oncology

## 2023-10-24 ENCOUNTER — Encounter: Payer: PPO | Admitting: Obstetrics and Gynecology

## 2023-11-03 ENCOUNTER — Ambulatory Visit: Attending: Cardiology

## 2023-11-03 ENCOUNTER — Encounter

## 2023-11-03 DIAGNOSIS — I82401 Acute embolism and thrombosis of unspecified deep veins of right lower extremity: Secondary | ICD-10-CM | POA: Diagnosis not present

## 2023-11-03 DIAGNOSIS — Z5181 Encounter for therapeutic drug level monitoring: Secondary | ICD-10-CM

## 2023-11-03 LAB — POCT INR: INR: 2.2 (ref 2.0–3.0)

## 2023-11-03 NOTE — Patient Instructions (Signed)
 Take 2 tablets today only  then continue taking Warfarin 1 tablet daily except for 1/2 tablet on Sundays and Thursdays.  Recheck INR 3 weeks.  [facility] [phone]

## 2023-11-08 ENCOUNTER — Other Ambulatory Visit: Payer: Self-pay | Admitting: Behavioral Health

## 2023-11-15 ENCOUNTER — Ambulatory Visit: Payer: PPO | Admitting: Hematology & Oncology

## 2023-11-15 ENCOUNTER — Inpatient Hospital Stay: Payer: PPO

## 2023-11-16 ENCOUNTER — Ambulatory Visit: Payer: PPO | Admitting: Behavioral Health

## 2023-11-20 ENCOUNTER — Ambulatory Visit

## 2023-11-22 ENCOUNTER — Ambulatory Visit: Attending: Cardiovascular Disease

## 2023-11-22 ENCOUNTER — Encounter

## 2023-11-22 ENCOUNTER — Encounter: Admitting: Obstetrics and Gynecology

## 2023-11-22 DIAGNOSIS — I2782 Chronic pulmonary embolism: Secondary | ICD-10-CM | POA: Diagnosis not present

## 2023-11-22 DIAGNOSIS — Z5181 Encounter for therapeutic drug level monitoring: Secondary | ICD-10-CM

## 2023-11-22 DIAGNOSIS — I82401 Acute embolism and thrombosis of unspecified deep veins of right lower extremity: Secondary | ICD-10-CM | POA: Diagnosis not present

## 2023-11-22 LAB — POCT INR: INR: 2.9 (ref 2.0–3.0)

## 2023-11-22 NOTE — Patient Instructions (Signed)
 Description   Continue taking Warfarin 1 tablet daily except for 1/2 tablet on Sundays and Thursdays.  Recheck INR 4 weeks.  [facility] [phone]

## 2023-11-23 ENCOUNTER — Encounter: Payer: Self-pay | Admitting: Behavioral Health

## 2023-11-23 ENCOUNTER — Ambulatory Visit: Admitting: Behavioral Health

## 2023-11-23 DIAGNOSIS — R5381 Other malaise: Secondary | ICD-10-CM

## 2023-11-23 DIAGNOSIS — F331 Major depressive disorder, recurrent, moderate: Secondary | ICD-10-CM

## 2023-11-23 DIAGNOSIS — F99 Mental disorder, not otherwise specified: Secondary | ICD-10-CM | POA: Diagnosis not present

## 2023-11-23 DIAGNOSIS — F5105 Insomnia due to other mental disorder: Secondary | ICD-10-CM

## 2023-11-23 DIAGNOSIS — F411 Generalized anxiety disorder: Secondary | ICD-10-CM

## 2023-11-23 DIAGNOSIS — R4589 Other symptoms and signs involving emotional state: Secondary | ICD-10-CM | POA: Diagnosis not present

## 2023-11-23 MED ORDER — ARIPIPRAZOLE 5 MG PO TABS
5.0000 mg | ORAL_TABLET | Freq: Every day | ORAL | 3 refills | Status: DC
Start: 2023-11-23 — End: 2024-01-24

## 2023-11-23 MED ORDER — MIRTAZAPINE 7.5 MG PO TABS
7.5000 mg | ORAL_TABLET | Freq: Every day | ORAL | 1 refills | Status: AC
Start: 2023-11-23 — End: ?

## 2023-11-23 MED ORDER — ESCITALOPRAM OXALATE 20 MG PO TABS
20.0000 mg | ORAL_TABLET | Freq: Every day | ORAL | 1 refills | Status: DC
Start: 2023-11-23 — End: 2024-01-24

## 2023-11-23 NOTE — Progress Notes (Signed)
 [facility]  Patient ID: Emma Lopez,  MRN: [id number]  PCP: Azalia Leo, MD  Date of Evaluation: 11/23/2023 Time spent:30 minutes  Chief Complaint:  Chief Complaint   Depression; Anxiety; Follow-up; Medication Refill; Patient Education     HISTORY/CURRENT STATUS: HPI "[name]", 78 year old female presents to this office for follow up and medication management. Says that she has been taking Lexapro  and Abilify  as prescribed since last visit. She reports poor sleep waking up during the night and is frustrated. She is self aware of personality changes. Now has aid 3 days per week to assist with transit and other light task. She is open to trying new medication to help with sleep quality.  She reports depression today at 4/10, and anxiety at 5/10.  She is sleeping 7 to 8 hours per night.  She denies any history of mania, no psychosis, no auditory or visual hallucinations, no delirium.  Denies suicidal ideation or homicidal ideation. Pt feels safe and verbally contracts for safety.    Past psychiatric medication trials:  Lexapro  Cymbalta BuSpar Viibryd   Zoloft  Individual Medical History/ Review of Systems: Changes? :No   Allergies: Hydroxyzine  and Sulfa antibiotics  Current Medications:  Current Outpatient Medications:    mirtazapine  (REMERON ) 7.5 MG tablet, Take 1 tablet (7.5 mg total) by mouth at bedtime., Disp: 30 tablet, Rfl: 1   ARIPiprazole  (ABILIFY ) 5 MG tablet, Take 1 tablet (5 mg total) by mouth daily., Disp: 30 tablet, Rfl: 3   escitalopram  (LEXAPRO ) 20 MG tablet, Take 1 tablet (20 mg total) by mouth daily., Disp: 90 tablet, Rfl: 1   Evolocumab  (REPATHA  SURECLICK) 140 MG/ML SOAJ, Inject 140 mg into the skin every 14 (fourteen) days., Disp: 2 mL, Rfl: 11   rOPINIRole  (REQUIP ) 1 MG tablet, TAKE 1 TABLET BY MOUTH AT BEDTIME., Disp: 90 tablet, Rfl: 1   warfarin (COUMADIN ) 1 MG tablet, TAKE ONE-HALF TABLET TO 1 TABLET BY MOUTH DAILY OR AS DIRECTED BY  ANTICOAGULATION CLINIC., Disp: 75 tablet, Rfl: 0  Current Facility-Administered Medications:    denosumab  (PROLIA ) injection 60 mg, 60 mg, Subcutaneous, Once, Amundson C Silva, Brook E, MD Medication Side Effects: none  Family Medical/ Social History: Changes? No  MENTAL HEALTH EXAM:  There were no vitals taken for this visit.There is no height or weight on file to calculate BMI.  General Appearance: Casual, Neat, and Well Groomed  Eye Contact:  Good  Speech:  Clear and Coherent  Volume:  Normal  Mood:  NA  Affect:  Appropriate  Thought Process:  Coherent  Orientation:  Full (Time, Place, and Person)  Thought Content: Logical   Suicidal Thoughts:  No  Homicidal Thoughts:  No  Memory:  WNL  Judgement:  Good  Insight:  Good  Psychomotor Activity:  Normal  Concentration:  Concentration: Good  Recall:  Good  Fund of Knowledge: Good  Language: Good  Assets:  Desire for Improvement  ADL's:  Intact  Cognition: WNL  Prognosis:  Good    DIAGNOSES:    ICD-10-CM   1. Major depressive disorder, recurrent episode, moderate (HCC)  F33.1 escitalopram  (LEXAPRO ) 20 MG tablet    ARIPiprazole  (ABILIFY ) 5 MG tablet    2. Generalized anxiety disorder  F41.1 escitalopram  (LEXAPRO ) 20 MG tablet    ARIPiprazole  (ABILIFY ) 5 MG tablet    3. Chronic fatigue and malaise  R53.82 escitalopram  (LEXAPRO ) 20 MG tablet   R53.81     4. Loneliness  R45.89     5. Insomnia due to other  mental disorder  F51.05 mirtazapine  (REMERON ) 7.5 MG tablet   F99       Receiving Psychotherapy: No    RECOMMENDATIONS:   Greater than 50% of  30  min face to face time with patient was spent on counseling and coordination of care.  We discussed her report of poor sleep, personality changes, frustration, and problems adapting to loss of independency.  We talked about her 5 day stay at [facility] before checking herself out. Says it was not good fit. She is pursuing psychotherapy.  She had aid that assist her  at home 3 days per week.      She agrees to continue as prescribed and will re assess in two months. We discussed her desire to stop coming in for appointments and I explained that these medications require supervision.    Recommended Mag Threonate supplement at bedtime.    We agreed to:   To continue with Lexapro  20 mg daily To continue Abilify  5 mg  daily at bedtime  To start Mirtazapine  7.5 mg at bedtime for insomnia Will report side effects or worsening symptoms promptly Will follow-up in 8 weeks to reassess per pt Provided emergency contact information Reviewed PDMP   Lincoln Renshaw, NP

## 2023-12-01 ENCOUNTER — Ambulatory Visit: Admitting: Behavioral Health

## 2023-12-06 ENCOUNTER — Encounter: Payer: Self-pay | Admitting: Hematology & Oncology

## 2023-12-06 ENCOUNTER — Inpatient Hospital Stay: Attending: Hematology & Oncology

## 2023-12-06 ENCOUNTER — Inpatient Hospital Stay: Admitting: Hematology & Oncology

## 2023-12-06 VITALS — BP 127/59 | HR 59 | Temp 98.7°F | Resp 20 | Ht 62.0 in | Wt 113.0 lb

## 2023-12-06 DIAGNOSIS — F32A Depression, unspecified: Secondary | ICD-10-CM | POA: Diagnosis not present

## 2023-12-06 DIAGNOSIS — I82402 Acute embolism and thrombosis of unspecified deep veins of left lower extremity: Secondary | ICD-10-CM | POA: Diagnosis present

## 2023-12-06 DIAGNOSIS — Z7902 Long term (current) use of antithrombotics/antiplatelets: Secondary | ICD-10-CM | POA: Insufficient documentation

## 2023-12-06 DIAGNOSIS — R634 Abnormal weight loss: Secondary | ICD-10-CM | POA: Diagnosis not present

## 2023-12-06 DIAGNOSIS — F418 Other specified anxiety disorders: Secondary | ICD-10-CM

## 2023-12-06 DIAGNOSIS — Z7901 Long term (current) use of anticoagulants: Secondary | ICD-10-CM

## 2023-12-06 DIAGNOSIS — I824Z2 Acute embolism and thrombosis of unspecified deep veins of left distal lower extremity: Secondary | ICD-10-CM

## 2023-12-06 DIAGNOSIS — I82401 Acute embolism and thrombosis of unspecified deep veins of right lower extremity: Secondary | ICD-10-CM

## 2023-12-06 DIAGNOSIS — D6862 Lupus anticoagulant syndrome: Secondary | ICD-10-CM | POA: Insufficient documentation

## 2023-12-06 LAB — CMP (CANCER CENTER ONLY)
ALT: 19 U/L (ref 0–44)
AST: 19 U/L (ref 15–41)
Albumin: 4.2 g/dL (ref 3.5–5.0)
Alkaline Phosphatase: 62 U/L (ref 38–126)
Anion gap: 6 (ref 5–15)
BUN: 26 mg/dL — ABNORMAL HIGH (ref 8–23)
CO2: 32 mmol/L (ref 22–32)
Calcium: 9.1 mg/dL (ref 8.9–10.3)
Chloride: 104 mmol/L (ref 98–111)
Creatinine: 0.83 mg/dL (ref 0.44–1.00)
GFR, Estimated: >60 mL/min (ref >60)
Glucose, Bld: 94 mg/dL (ref 70–99)
Potassium: 4.2 mmol/L (ref 3.5–5.1)
Sodium: 142 mmol/L (ref 135–145)
Total Bilirubin: 0.2 mg/dL (ref 0.0–1.2)
Total Protein: 6.8 g/dL (ref 6.5–8.1)

## 2023-12-06 LAB — CBC WITH DIFFERENTIAL (CANCER CENTER ONLY)
Abs Immature Granulocytes: 0.03 10*3/uL (ref 0.00–0.07)
Basophils Absolute: 0.1 10*3/uL (ref 0.0–0.1)
Basophils Relative: 1 %
Eosinophils Absolute: 0.1 10*3/uL (ref 0.0–0.5)
Eosinophils Relative: 1 %
HCT: 45.4 % (ref 36.0–46.0)
Hemoglobin: 15.2 g/dL — ABNORMAL HIGH (ref 12.0–15.0)
Immature Granulocytes: 0 %
Lymphocytes Relative: 18 %
Lymphs Abs: 1.4 10*3/uL (ref 0.7–4.0)
MCH: 31.9 pg (ref 26.0–34.0)
MCHC: 33.5 g/dL (ref 30.0–36.0)
MCV: 95.4 fL (ref 80.0–100.0)
Monocytes Absolute: 0.7 10*3/uL (ref 0.1–1.0)
Monocytes Relative: 9 %
Neutro Abs: 5.5 10*3/uL (ref 1.7–7.7)
Neutrophils Relative %: 71 %
Platelet Count: 247 10*3/uL (ref 150–400)
RBC: 4.76 MIL/uL (ref 3.87–5.11)
RDW: 13.2 % (ref 11.5–15.5)
WBC Count: 7.8 10*3/uL (ref 4.0–10.5)
nRBC: 0 % (ref 0.0–0.2)

## 2023-12-06 NOTE — Progress Notes (Signed)
 Hematology and Oncology Follow Up Visit  Emma Lopez [id number] 01-22-1946 78 y.o. 12/06/2023   Principle Diagnosis:  Recurrent DVT of the left leg -- probable progression Positive lupus anticoagulant   Past Therapy: Xarelto  20 mg p.o. daily -- d/c on 01/06/2021 Pradaxa  150 mg po BID - start on 01/06/2021 - d/c on 01/25/2021   Current Therapy:        Coumadin   -- dosed by [facility]inic -- keep INR 2.5-3.5   Interim History:  Emma Lopez is here today for follow-up.  Unfortunately, she just has had a hard time with depression.  I know this is been a problem for her in the past.  Thankfully, she does have friends who help her out.  She has somebody help drive her today.  It may becoming more of a difficulty for her to come out here.  I told her that if she has any problems, not her, we could always arrange for her to be seen closer to where she lives..  She has her INR checked at the [facility]  Clinic.  I think as long as we have this done, we should be in decent shape.  She has had no bleeding.  She has had no leg pain or swelling.  There is been no cough or shortness of breath.  She has had no chest wall pain.  There has been no fever.  She has avoided COVID and Influenza.  There has been no problems with nausea or vomiting.  She has lost weight.  A lot of the weight loss because of her depression she just does not want to eat.  Currently, I would have to say that her performance status is probably ECOG 2.     Medications:  Allergies as of 12/06/2023       Reactions   Hydroxyzine  Other (See Comments)   Vertigo   Sulfa Antibiotics Rash        Medication List        Accurate as of Dec 06, 2023  3:47 PM. If you have any questions, ask your nurse or doctor.          ARIPiprazole  5 MG tablet Commonly known as: ABILIFY  Take 1 tablet (5 mg total) by mouth daily.   escitalopram  20 MG tablet Commonly known as: LEXAPRO  Take 1 tablet (20 mg total) by mouth daily.    mirtazapine  7.5 MG tablet Commonly known as: REMERON  Take 1 tablet (7.5 mg total) by mouth at bedtime.   Repatha  SureClick 140 MG/ML Soaj Generic drug: Evolocumab  Inject 140 mg into the skin every 14 (fourteen) days.   rOPINIRole  1 MG tablet Commonly known as: REQUIP  TAKE 1 TABLET BY MOUTH AT BEDTIME.   Vitamin D  (Ergocalciferol ) 1.25 MG (50000 UNIT) Caps capsule Commonly known as: DRISDOL  Take 50,000 Units by mouth every 30 (thirty) days.   warfarin 1 MG tablet Commonly known as: COUMADIN  Take as directed by the anticoagulation clinic. If you are unsure how to take this medication, talk to your nurse or doctor. Original instructions: TAKE ONE-HALF TABLET TO 1 TABLET BY MOUTH DAILY OR AS DIRECTED BY ANTICOAGULATION CLINIC.        Allergies:  Allergies  Allergen Reactions   Hydroxyzine  Other (See Comments)    Vertigo    Sulfa Antibiotics Rash    Past Medical History, Surgical history, Social history, and Family History were reviewed and updated.  Review of Systems: Review of Systems  Constitutional: Negative.   HENT: Negative.    Eyes:  Negative.   Respiratory: Negative.    Cardiovascular:  Positive for leg swelling.  Gastrointestinal: Negative.   Genitourinary: Negative.   Musculoskeletal: Negative.   Skin: Negative.   Neurological: Negative.   Endo/Heme/Allergies: Negative.   Psychiatric/Behavioral:  Positive for depression.      Physical Exam:  height is 5\' 2"  (1.575 m) and weight is 113 lb 0.6 oz (51.3 kg). Her oral temperature is 98.7 F (37.1 C). Her blood pressure is 127/59 (abnormal) and her pulse is 59 (abnormal). Her respiration is 20 and oxygen saturation is 98%.   Wt Readings from Last 3 Encounters:  12/06/23 113 lb 0.6 oz (51.3 kg)  07/14/23 125 lb (56.7 kg)  07/05/23 123 lb (55.8 kg)   Physical Exam Vitals reviewed.  HENT:     Head: Normocephalic and atraumatic.  Eyes:     Pupils: Pupils are equal, round, and reactive to light.   Cardiovascular:     Rate and Rhythm: Normal rate and regular rhythm.     Heart sounds: Normal heart sounds.  Pulmonary:     Effort: Pulmonary effort is normal.     Breath sounds: Normal breath sounds.  Abdominal:     General: Bowel sounds are normal.     Palpations: Abdomen is soft.  Musculoskeletal:        General: No tenderness or deformity. Normal range of motion.     Cervical back: Normal range of motion.     Comments: Lower extremities does show a little bit of swelling in the left lower leg.  No venous cord is noted.  She has a negative Homans' sign.  Right leg is unremarkable.  Lymphadenopathy:     Cervical: No cervical adenopathy.  Skin:    General: Skin is warm and dry.     Findings: No erythema or rash.  Neurological:     Mental Status: She is alert and oriented to person, place, and time.  Psychiatric:        Behavior: Behavior normal.        Thought Content: Thought content normal.        Judgment: Judgment normal.       Lab Results  Component Value Date   WBC 7.8 12/06/2023   HGB 15.2 (H) 12/06/2023   HCT 45.4 12/06/2023   MCV 95.4 12/06/2023   PLT 247 12/06/2023   Lab Results  Component Value Date   FERRITIN 69 07/05/2021   IRON 111 12/04/2019   TIBC 351 12/04/2019   UIBC 240 12/04/2019   IRONPCTSAT 32 12/04/2019   Lab Results  Component Value Date   RBC 4.76 12/06/2023   No results found for: "KPAFRELGTCHN", "LAMBDASER", "KAPLAMBRATIO" No results found for: "IGGSERUM", "IGA", "IGMSERUM" No results found for: "TOTALPROTELP", "ALBUMINELP", "A1GS", "A2GS", "BETS", "BETA2SER", "GAMS", "MSPIKE", "SPEI"   Chemistry      Component Value Date/Time   NA 142 12/06/2023 1402   NA 142 01/03/2018 1601   K 4.2 12/06/2023 1402   CL 104 12/06/2023 1402   CO2 32 12/06/2023 1402   BUN 26 (H) 12/06/2023 1402   BUN 20 01/03/2018 1601   CREATININE 0.83 12/06/2023 1402      Component Value Date/Time   CALCIUM  9.1 12/06/2023 1402   ALKPHOS 62 12/06/2023 1402    AST 19 12/06/2023 1402   ALT 19 12/06/2023 1402   BILITOT 0.2 12/06/2023 1402       Impression and Plan: Emma Lopez is a very pleasant 78 yo caucasian female with history of recurrent  DVT and positive lupus anticoagulant.   I just feel bad for her.  I know she is trying hard.  I know that depression has been a problem for her in the past.  For right now, we will plan to get her back in 6 months.  If she cannot make it I totally would understand.  I hope that she does get better.  I know she sees a psychiatrist.    Ivor Mars, MD 5/7/20253:47 PM

## 2023-12-07 DIAGNOSIS — L570 Actinic keratosis: Secondary | ICD-10-CM | POA: Diagnosis not present

## 2023-12-07 DIAGNOSIS — L821 Other seborrheic keratosis: Secondary | ICD-10-CM | POA: Diagnosis not present

## 2023-12-07 DIAGNOSIS — D2371 Other benign neoplasm of skin of right lower limb, including hip: Secondary | ICD-10-CM | POA: Diagnosis not present

## 2023-12-07 DIAGNOSIS — D225 Melanocytic nevi of trunk: Secondary | ICD-10-CM | POA: Diagnosis not present

## 2023-12-12 ENCOUNTER — Ambulatory Visit (INDEPENDENT_AMBULATORY_CARE_PROVIDER_SITE_OTHER): Admitting: Obstetrics and Gynecology

## 2023-12-12 ENCOUNTER — Encounter: Payer: Self-pay | Admitting: Obstetrics and Gynecology

## 2023-12-12 ENCOUNTER — Other Ambulatory Visit (HOSPITAL_COMMUNITY)
Admission: RE | Admit: 2023-12-12 | Discharge: 2023-12-12 | Disposition: A | Discharge status: Home / Self Care | Source: Ambulatory Visit | Attending: Obstetrics and Gynecology | Admitting: Obstetrics and Gynecology

## 2023-12-12 VITALS — BP 118/78 | HR 63 | Ht 62.75 in | Wt 113.0 lb

## 2023-12-12 DIAGNOSIS — M810 Age-related osteoporosis without current pathological fracture: Secondary | ICD-10-CM

## 2023-12-12 DIAGNOSIS — Z9189 Other specified personal risk factors, not elsewhere classified: Secondary | ICD-10-CM | POA: Insufficient documentation

## 2023-12-12 DIAGNOSIS — Z01419 Encounter for gynecological examination (general) (routine) without abnormal findings: Secondary | ICD-10-CM

## 2023-12-12 DIAGNOSIS — Z9289 Personal history of other medical treatment: Secondary | ICD-10-CM

## 2023-12-12 DIAGNOSIS — R232 Flushing: Secondary | ICD-10-CM | POA: Diagnosis not present

## 2023-12-12 DIAGNOSIS — Z1272 Encounter for screening for malignant neoplasm of vagina: Secondary | ICD-10-CM | POA: Diagnosis not present

## 2023-12-12 NOTE — Patient Instructions (Signed)

## 2023-12-12 NOTE — Progress Notes (Unsigned)
 78 y.o. G0P0 Single Caucasian female here for a breast and pelvic exam.    The patient is also followed for family history of ovarian cancer, history of DES exposure, and hx of vulvitis.   She states she had a little bit of blood with wiping after voiding today at the office.  No blood in her urine otherwise. No dysuria.   Not using steroid treatment for her vulvitis.   No current tx for osteoporosis.   Has had hot flashes recur.  She has made some medication adjustments in treating depression/anxiety.  Her flashes persist.  She wonders if it is from the Ability.   Dealing with anxiety and panic attacks.   She has a personal attendant who helps her with activities of daily living - driving, shopping.  She states they have talked about potential for meal preparation support.   PCP: Azalia Leo, MD   No LMP recorded. Patient has had a hysterectomy.           Sexually active: No.  The current method of family planning is status post hysterectomy.    Menopausal hormone therapy:  n/a Exercising: No.    Smoker:  Former  OB History     Gravida  0   Para  0   Term      Preterm      AB      Living         SAB      IAB      Ectopic      Multiple      Live Births              HEALTH MAINTENANCE: Last 2 paps: 09/28/22 neg HR HPV neg, 09/27/21 neg HR HPV neg  History of abnormal Pap or positive HPV:  no Mammogram:  02/20/23 Breast Density Cat A, BIRADS Cat 1 neg  Colonoscopy:  01/11/22  Bone Density:  08/24/22  Result  Osteoporosis    Immunization History  Administered Date(s) Administered   Fluad Quad(high Dose 65+) 05/05/2019   Influenza Split 05/23/2012, 05/01/2018   Influenza, High Dose Seasonal PF 05/05/2019   Influenza-Unspecified 05/01/2018   PFIZER(Purple Top)SARS-COV-2 Vaccination 09/30/2019, 10/21/2019   Pneumococcal Conjugate-13 06/14/2016   Pneumococcal Polysaccharide-23 06/20/2017   Pneumococcal-Unspecified 04/21/2021   Tdap 03/30/2010    Zoster Recombinant(Shingrix) 08/14/2018, 01/01/2019      reports that she quit smoking about 45 years ago. Her smoking use included cigarettes. She started smoking about 51 years ago. She has a 0.6 pack-year smoking history. She has never used smokeless tobacco. She reports that she does not currently use alcohol . She reports that she does not use drugs.  Past Medical History:  Diagnosis Date   Abnormal EKG    Anxiety    Arthritis    oa   Bradycardia 12/14/2017   CIN III (cervical intraepithelial neoplasia III) 2000   Complication of anesthesia    did well last 2 times with procedures   Depression    DES exposure in utero    DVT (deep venous thrombosis) (HCC) 01/2009   LEFT LEG   DVT (deep venous thrombosis) (HCC) 10/30/2020   Dyspnea on exertion 10/2017   Elevated triglycerides with high cholesterol    Endometriosis    Fibromyalgia    GERD (gastroesophageal reflux disease)    History of colon polyps    History of hiatal hernia    told by some md has, some say not   IBS (irritable bowel syndrome)  2015   Left bundle branch block    intermittent   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  low TSH 0.22 on 03/24/23.   Vitamin D  deficiency     Past Surgical History:  Procedure Laterality Date   ABDOMINAL HYSTERECTOMY  2001   TAH, partial   APPENDECTOMY  1978   COLONOSCOPY WITH PROPOFOL  N/A 12/07/2015   Procedure: COLONOSCOPY WITH PROPOFOL ;  Surgeon: Garrett Kallman, MD;  Location: [facility];  Service: Endoscopy;  Laterality: N/A;   COLONOSCOPY WITH PROPOFOL  N/A 01/11/2022   Procedure: COLONOSCOPY WITH PROPOFOL ;  Surgeon:  Genell Ken, MD;  Location: [facility];  Service: Gastroenterology;  Laterality: N/A;   colonscopy  7 yrs ago   other in past   ESOPHAGOGASTRODUODENOSCOPY (EGD) WITH PROPOFOL  N/A 12/07/2015   Procedure: ESOPHAGOGASTRODUODENOSCOPY (EGD) WITH PROPOFOL ;  Surgeon: Garrett Kallman, MD;  Location: [facility];  Service: Endoscopy;  Laterality: N/A;   ESOPHAGOGASTRODUODENOSCOPY ENDOSCOPY  yrs ago   FACIAL COSMETIC SURGERY     GYNECOLOGIC CRYOSURGERY     HEMOSTASIS CLIP PLACEMENT  01/11/2022   Procedure: HEMOSTASIS CLIP PLACEMENT;  Surgeon: Genell Ken, MD;  Location: [facility];  Service: Gastroenterology;;   HOT HEMOSTASIS N/A 01/11/2022   Procedure: HOT HEMOSTASIS (ARGON PLASMA COAGULATION/BICAP);  Surgeon: Genell Ken, MD;  Location: [facility];  Service: Gastroenterology;  Laterality: N/A;   MYOMECTOMY     POLYPECTOMY  01/11/2022   Procedure: POLYPECTOMY;  Surgeon: Genell Ken, MD;  Location: [facility];  Service: Gastroenterology;;   REPLACEMENT TOTAL JOINT WRIST W/ PROSTHETIC IMPLANT Right 08/01/2021   TONSILLECTOMY      Current Outpatient Medications  Medication Sig Dispense Refill   ARIPiprazole  (ABILIFY ) 5 MG tablet Take 1 tablet (5 mg total) by mouth daily. 30 tablet 3   escitalopram  (LEXAPRO ) 20 MG tablet Take 1 tablet (20 mg total) by mouth daily. 90 tablet 1   mirtazapine  (REMERON ) 7.5 MG tablet Take 1 tablet (7.5 mg total) by mouth at bedtime. 30 tablet 1   Vitamin D , Ergocalciferol , (DRISDOL ) 1.25 MG (50000 UNIT) CAPS capsule Take 50,000 Units by mouth every 30 (thirty) days.     warfarin (COUMADIN ) 1 MG tablet TAKE ONE-HALF TABLET TO 1 TABLET BY MOUTH DAILY OR AS DIRECTED BY ANTICOAGULATION CLINIC. 75 tablet 0   Evolocumab  (REPATHA  SURECLICK) 140 MG/ML SOAJ Inject 140 mg into the skin every 14 (fourteen) days. (Patient not taking: Reported on 12/06/2023) 2 mL 11   rOPINIRole  (REQUIP ) 1 MG tablet TAKE 1 TABLET BY MOUTH AT BEDTIME. (Patient not taking: Reported on 12/06/2023) 90  tablet 1   Current Facility-Administered Medications  Medication Dose Route Frequency Provider Last Rate Last Admin   denosumab  (PROLIA ) injection 60 mg  60 mg Subcutaneous Once Amundson C Silva, Santanna Olenik E, MD        ALLERGIES: Hydroxyzine  and Sulfa antibiotics  Family History  Problem Relation Age of Onset   Depression Mother    Anxiety disorder Mother    Hypertension Mother    Breast cancer Mother        75's   Cancer Father  COLON   Cancer Sister        ovarian cancer   Depression Sister    Anxiety disorder Sister    Hypertension Sister    Stroke Sister    Breast cancer Maternal Aunt        Age 83's   Cancer Maternal Aunt        Melanoma   Alzheimer's disease Maternal Aunt    Breast cancer Maternal Aunt        70's   Cancer Maternal Aunt        Colon CA   Alzheimer's disease Maternal Aunt    Cancer Paternal Aunt        OVARIAN and COLON    Review of Systems  All other systems reviewed and are negative.   PHYSICAL EXAM:  BP 118/78 (BP Location: Left Arm, Patient Position: Sitting)   Pulse 63   Ht 5' 2.75" (1.594 m)   Wt 113 lb (51.3 kg)   SpO2 98%   BMI 20.18 kg/m     General appearance: alert, cooperative and appears stated age Head: normocephalic, without obvious abnormality, atraumatic Neck: no adenopathy, supple, symmetrical, trachea midline and thyroid  normal to inspection and palpation Lungs: clear to auscultation bilaterally Breasts: normal appearance, no masses or tenderness, No nipple retraction or dimpling, No nipple discharge or bleeding, No axillary adenopathy Heart: regular rate and rhythm Abdomen: soft, non-tender; no masses, no organomegaly Extremities: extremities normal, atraumatic, no cyanosis or edema Skin: skin color, texture, turgor normal. No rashes or lesions Lymph nodes: cervical, supraclavicular, and axillary nodes normal. Neurologic: grossly normal  Pelvic: External genitalia:  erythema of the skin above he urethra and  below the clitoris.               No abnormal inguinal nodes palpated.              Urethra:  normal appearing urethra with no masses, tenderness or lesions              Bartholins and Skenes: normal                 Vagina: normal appearing vagina with normal color and discharge, no lesions              Cervix: absent              Pap taken: yes Bimanual Exam:  Uterus:  absent              Adnexa: no mass, fullness, tenderness              Rectal exam: yes.  Confirms.              Anus:  normal sphincter tone, no lesions  Chaperone was present for exam:  Jada, CMA  ASSESSMENT: Encounter for breast and pelvic exam.  Status post hysterectomy 2001 for CIN III.  Ovaries remain.  Personal hx of other medical treatment.  Hx DES exposures.  Vaginal cancer screening.  Osteoporosis.  Hx lichenoid vulvitis.  Tx with clobetasol  ointment in past.  No treatment currently.  FH breast and ovarian cancer.  Negative genetic testing.  Normal adnexa on CT imaging 03/17/23.  Hx DVT/PE. Depression and anxiety.  Hot flashes.  I would not attribute these to a postmenopausal status and would look to other etiologies if they are new onset.   PLAN: Mammogram screening discussed. Self breast awareness reviewed. Pap and reflex HRV collected:  yes Guidelines for Calcium , Vitamin D , regular  exercise program including cardiovascular and weight bearing exercise. Medication refills:  NA.  She declines pelvic US  this year to check her ovaries.  We discussed the limitations of CA125 to detect signs of ovarian cancer.  BMD at [facility] for after August 24, 2024.  Order will be faxed to them.  I do recommend that she discuss her symptoms of hot flashes with her provider currently prescribing Abilify .   Follow up:  yearly and prn.    Additional counseling given.  yes. 35 min  total time was spent for this patient encounter, including preparation, face-to-face counseling with the patient, coordination of care, and  documentation of the encounter in addition to doing the breast and pelvic exam and pap.

## 2023-12-13 DIAGNOSIS — F331 Major depressive disorder, recurrent, moderate: Secondary | ICD-10-CM | POA: Diagnosis not present

## 2023-12-13 DIAGNOSIS — R7303 Prediabetes: Secondary | ICD-10-CM | POA: Diagnosis not present

## 2023-12-13 DIAGNOSIS — E785 Hyperlipidemia, unspecified: Secondary | ICD-10-CM | POA: Diagnosis not present

## 2023-12-13 DIAGNOSIS — M810 Age-related osteoporosis without current pathological fracture: Secondary | ICD-10-CM | POA: Diagnosis not present

## 2023-12-13 DIAGNOSIS — G729 Myopathy, unspecified: Secondary | ICD-10-CM | POA: Diagnosis not present

## 2023-12-13 DIAGNOSIS — F419 Anxiety disorder, unspecified: Secondary | ICD-10-CM | POA: Diagnosis not present

## 2023-12-13 DIAGNOSIS — I2510 Atherosclerotic heart disease of native coronary artery without angina pectoris: Secondary | ICD-10-CM | POA: Diagnosis not present

## 2023-12-13 DIAGNOSIS — E042 Nontoxic multinodular goiter: Secondary | ICD-10-CM | POA: Diagnosis not present

## 2023-12-13 DIAGNOSIS — R911 Solitary pulmonary nodule: Secondary | ICD-10-CM | POA: Diagnosis not present

## 2023-12-13 DIAGNOSIS — M797 Fibromyalgia: Secondary | ICD-10-CM | POA: Diagnosis not present

## 2023-12-13 DIAGNOSIS — Z86711 Personal history of pulmonary embolism: Secondary | ICD-10-CM | POA: Diagnosis not present

## 2023-12-13 DIAGNOSIS — I8290 Acute embolism and thrombosis of unspecified vein: Secondary | ICD-10-CM | POA: Diagnosis not present

## 2023-12-14 ENCOUNTER — Ambulatory Visit: Payer: Self-pay | Admitting: Obstetrics and Gynecology

## 2023-12-14 LAB — CYTOLOGY - PAP: Diagnosis: NEGATIVE

## 2023-12-15 ENCOUNTER — Other Ambulatory Visit: Payer: Self-pay | Admitting: Behavioral Health

## 2023-12-15 DIAGNOSIS — F5105 Insomnia due to other mental disorder: Secondary | ICD-10-CM

## 2023-12-18 ENCOUNTER — Telehealth: Payer: Self-pay | Admitting: Behavioral Health

## 2023-12-18 NOTE — Telephone Encounter (Signed)
 Emma Lopez lvm stating she was advised to call the office to report on her medication effectiveness. She stated she is very pleased with the results of the Mag Threonate suggested.

## 2023-12-18 NOTE — Telephone Encounter (Signed)
 FYI - magnesium working well

## 2023-12-19 NOTE — Telephone Encounter (Signed)
 Noted, thank you

## 2023-12-20 ENCOUNTER — Ambulatory Visit: Attending: Cardiology | Admitting: *Deleted

## 2023-12-20 DIAGNOSIS — I2782 Chronic pulmonary embolism: Secondary | ICD-10-CM | POA: Diagnosis not present

## 2023-12-20 DIAGNOSIS — I82401 Acute embolism and thrombosis of unspecified deep veins of right lower extremity: Secondary | ICD-10-CM

## 2023-12-20 DIAGNOSIS — Z5181 Encounter for therapeutic drug level monitoring: Secondary | ICD-10-CM | POA: Diagnosis not present

## 2023-12-20 LAB — POCT INR: INR: 2.9 (ref 2.0–3.0)

## 2023-12-20 NOTE — Patient Instructions (Signed)
 Description   Continue taking Warfarin 1 tablet daily except for 1/2 tablet on Sundays and Thursdays.  Recheck INR 5 weeks.  [facility] [phone]

## 2023-12-26 ENCOUNTER — Telehealth: Payer: Self-pay | Admitting: Behavioral Health

## 2023-12-26 NOTE — Telephone Encounter (Signed)
 Patient notified of recommendation to split the Abilify  dose and take 2.5 mg QPM.

## 2023-12-26 NOTE — Telephone Encounter (Signed)
 Pt is currently taking 5 mg Abilify . She said you told her to take at night. She is also taking mirtazapine  7.5 mg. She said she feels like a zombie on the Abilify  and is asking if she can split in half. Is not reporting any benefit at this time.

## 2023-12-26 NOTE — Telephone Encounter (Signed)
 Pt called and LM this am. She is taking Abilify  5mg  and also her sleep med which does help with sleep. She feels that 5mg  Abilify  makes her too sleepy along with the sleep med and wishes to try to decrease her dose to Abilify  20mg  . If this is appropriate, please send it in to [facility] on [address], [place].

## 2024-01-06 ENCOUNTER — Other Ambulatory Visit: Payer: Self-pay | Admitting: Behavioral Health

## 2024-01-06 DIAGNOSIS — F411 Generalized anxiety disorder: Secondary | ICD-10-CM

## 2024-01-06 DIAGNOSIS — F331 Major depressive disorder, recurrent, moderate: Secondary | ICD-10-CM

## 2024-01-12 ENCOUNTER — Encounter: Payer: Self-pay | Admitting: Professional Counselor

## 2024-01-12 ENCOUNTER — Ambulatory Visit: Admitting: Professional Counselor

## 2024-01-12 DIAGNOSIS — F332 Major depressive disorder, recurrent severe without psychotic features: Secondary | ICD-10-CM

## 2024-01-12 DIAGNOSIS — F411 Generalized anxiety disorder: Secondary | ICD-10-CM | POA: Diagnosis not present

## 2024-01-12 NOTE — Progress Notes (Signed)
 [facility] Counsel[facility] Initial Adult Exam  Name: Emma Lopez Date: 01/12/2024 MRN: [id number] DOB: February 26, 1946 PCP: Stephane Leita DEL, MD  Time spent: 2:19 PM - 3:16 PM  Guardian/Payee:  pt    Paperwork requested:  No   Reason for Visit /Presenting Problem:  depression, anxiety  Mental Status Exam:    Appearance:   Neat     Behavior:  Appropriate, Sharing, and Motivated, Drowsy  Motor:  Normal  Speech/Language:   Clear and Coherent and Normal Rate  Affect:  Appropriate and Congruent  Mood:  normal  Thought process:  normal  Thought content:    WNL  Sensory/Perceptual disturbances:    WNL  Orientation:  oriented to person, place, time/date, and situation  Attention:  Good  Concentration:  Good  Memory:  Pt reports memory concerns; no observed issues with memory in session  Fund of knowledge:   Good  Insight:    Good  Judgment:   Good  Impulse Control:  Good   Reported Symptoms:  anhedonia, low mood, sleep concerns, fatigue, low appetite, low self esteem, trouble concentrating, vague SI no thoughts/plans, nervousness, worries, trouble relaxing, restlessness, catastrophic thinking, memory concerns, low motivation  Risk Assessment: Danger to Self:  Yes.  without intent/plan Self-injurious Behavior: No Danger to Others: No Duty to Warn:no Physical Aggression / Violence:No  Access to Firearms a concern: No  Gang Involvement:No  Patient / guardian was educated about steps to take if suicide or homicide risk level increases between visits: yes While future psychiatric events cannot be accurately predicted, the patient does not currently require acute inpatient psychiatric care and does not currently meet [place]  involuntary commitment criteria.  Substance Abuse History: Current substance abuse: No     Past Psychiatric History:   Previous psychological history is significant for anxiety and depression Outpatient Providers: no History of Psych Hospitalization: Yes 2024,  IOP 2025 Psychological Testing: pt has had neurocognitive testing and is receiving care   Abuse History: Victim of No., n/a   Report needed: No. Victim of Neglect:No. Perpetrator of n/a  Witness / Exposure to Domestic Violence: No   Protective Services Involvement: No  Witness to [employer] Violence:  No   Family History:  Family History  Problem Relation Age of Onset   Depression Mother    Anxiety disorder Mother    Hypertension Mother    Breast cancer Mother        40's   Cancer Father        COLON   Cancer Sister        ovarian cancer   Depression Sister    Anxiety disorder Sister    Hypertension Sister    Stroke Sister    Breast cancer Maternal Aunt        Age 20's   Cancer Maternal Aunt        Melanoma   Alzheimer's disease Maternal Aunt    Breast cancer Maternal Aunt        70's   Cancer Maternal Aunt        Colon CA   Alzheimer's disease Maternal Aunt    Cancer Paternal Aunt        OVARIAN and COLON    Living situation: the patient lives alone  Sexual Orientation:  Straight  Relationship Status: single               If a parent, number of children / ages: n/a  Support Systems; cousins, friend  Surveyor, quantity Stress:  No  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 special ed  Religion/Sprituality/World View:   [employer]ist faith of origin  Any cultural differences that may affect / interfere with treatment:  not applicable   Recreation/Hobbies: gardening, decorating, reading  Stressors:Other: isolation, low appetite, sense of stuckness, procrastination    Strengths:  Family, Friends, Able to [employer], and intelligence, well spoken, articulate, resiliency  Barriers:  n/a   Legal History: Pending legal issue / charges: The patient has no significant history of legal issues. History of legal issue / charges:  n/a  Medical History/Surgical History: reviewed Past Medical History:  Diagnosis Date   Abnormal EKG    Anxiety    Arthritis    oa   Bradycardia 12/14/2017   CIN III (cervical intraepithelial neoplasia III) 2000   Complication of anesthesia    did well last 2 times with procedures   Depression    DES exposure in utero    DVT (deep venous thrombosis) (HCC) 01/2009   LEFT LEG   DVT (deep venous thrombosis) (HCC) 10/30/2020   Dyspnea on exertion 10/2017   Elevated triglycerides with high cholesterol    Endometriosis    Fibromyalgia    GERD (gastroesophageal reflux disease)    History of colon polyps    History of hiatal hernia    told by some md has, some say not   IBS (irritable bowel syndrome) 2015   Left bundle branch block    intermittent   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  low TSH 0.22 on 03/24/23.   Vitamin D  deficiency     Past Surgical History:  Procedure Laterality Date   ABDOMINAL HYSTERECTOMY  2001   TAH, partial   APPENDECTOMY  1978   COLONOSCOPY WITH PROPOFOL  N/A 12/07/2015   Procedure: COLONOSCOPY WITH PROPOFOL ;  Surgeon: Gladis MARLA Louder, MD;  Location: [facility];  Service: Endoscopy;  Laterality: N/A;   COLONOSCOPY WITH PROPOFOL  N/A 01/11/2022   Procedure: COLONOSCOPY WITH PROPOFOL ;  Surgeon: Saintclair Jasper, MD;  Location: [facility];  Service: Gastroenterology;  Laterality: N/A;   colonscopy  7 yrs ago   other in past   ESOPHAGOGASTRODUODENOSCOPY (EGD) WITH PROPOFOL  N/A 12/07/2015   Procedure: ESOPHAGOGASTRODUODENOSCOPY (EGD) WITH PROPOFOL ;  Surgeon: Gladis MARLA Louder, MD;  Location: [facility];   Service: Endoscopy;  Laterality: N/A;   ESOPHAGOGASTRODUODENOSCOPY ENDOSCOPY  yrs ago   FACIAL COSMETIC SURGERY     GYNECOLOGIC CRYOSURGERY     HEMOSTASIS CLIP PLACEMENT  01/11/2022   Procedure: HEMOSTASIS CLIP PLACEMENT;  Surgeon: Saintclair Jasper, MD;  Location: [facility];  Service: Gastroenterology;;   HOT HEMOSTASIS N/A 01/11/2022   Procedure: HOT HEMOSTASIS (ARGON PLASMA COAGULATION/BICAP);  Surgeon: Saintclair Jasper, MD;  Location: [facility];  Service: Gastroenterology;  Laterality: N/A;   MYOMECTOMY     POLYPECTOMY  01/11/2022   Procedure: POLYPECTOMY;  Surgeon: Saintclair Jasper, MD;  Location: [facility];  Service: Gastroenterology;;   REPLACEMENT TOTAL JOINT WRIST W/ PROSTHETIC IMPLANT Right 08/01/2021   TONSILLECTOMY  Medications: Current Outpatient Medications  Medication Sig Dispense Refill   ARIPiprazole  (ABILIFY ) 5 MG tablet Take 1 tablet (5 mg total) by mouth daily. 30 tablet 3   escitalopram  (LEXAPRO ) 20 MG tablet Take 1 tablet (20 mg total) by mouth daily. 90 tablet 1   Evolocumab  (REPATHA  SURECLICK) 140 MG/ML SOAJ Inject 140 mg into the skin every 14 (fourteen) days. (Patient not taking: Reported on 12/06/2023) 2 mL 11   mirtazapine  (REMERON ) 7.5 MG tablet Take 1 tablet (7.5 mg total) by mouth at bedtime. 30 tablet 1   rOPINIRole  (REQUIP ) 1 MG tablet TAKE 1 TABLET BY MOUTH AT BEDTIME. (Patient not taking: Reported on 12/06/2023) 90 tablet 1   Vitamin D , Ergocalciferol , (DRISDOL ) 1.25 MG (50000 UNIT) CAPS capsule Take 50,000 Units by mouth every 30 (thirty) days.     warfarin (COUMADIN ) 1 MG tablet TAKE ONE-HALF TABLET TO 1 TABLET BY MOUTH DAILY OR AS DIRECTED BY ANTICOAGULATION CLINIC. 75 tablet 0   Current Facility-Administered Medications  Medication Dose Route Frequency Provider Last Rate Last Admin   denosumab  (PROLIA ) injection 60 mg  60 mg Subcutaneous Once Amundson C Silva, Brook E, MD        Allergies  Allergen Reactions   Hydroxyzine  Other (See Comments)     Vertigo    Sulfa Antibiotics Rash    Diagnoses:    ICD-10-CM   1. Severe episode of recurrent major depressive disorder, without psychotic features (HCC)  F33.2     2. Generalized anxiety disorder  F41.1       Treatment Provided: Counselor provided person-centered counseling including active listening, building of rapport; clinical assessment; facilitation of PHQ9 score 22, GAD7 score 14. Patient did not endorse symptoms of mania. Counselor and patient discussed patient symptom acuity. She voiced that while she feels quite anxious and depressed, she is making her bed, getting dressed, and going about her day, however she feels overwhelming fatigue and low motivation. Counselor and patient discussed her medications and counselor recommended she report symptoms to prescribing providers to consult regarding medicinal side effects. She reported having struggled with anxiety and depression across the lifespan, with symptoms exacerbated in recent year. She voiced loneliness and low appetite to be of concern, and that she is losing weight.  She also identified need for increased socialization, having a regular sitter but still feeling isolated.  Patient reported having memory concerns, fibromyalgia, and to have an appointment scheduled with a neuropsychologist this week.  Counselor and patient discussed safety planning per patient expression of vague SI, and discussed patient strengths, hobbies and interests, personal history and support system.  Plan of Care: Pt is scheduled for a follow-up; continue to build rapport, discuss symptoms and hx, discuss treatment plan and obtain consent. Pt to observe safety plan as discussed, should symptoms worsen. Pt to discuss symptoms with prescribing providers as may relate medications. Pt to follow up with Norleen Asa, neuropsychologist, for appointment. STG to continue with retirement home interest meetings/appointments, and consider community resources provided  such as church groups for older adults or senior center for social outlet and support.   Almarie ONEIDA Sprang, [facility]

## 2024-01-15 ENCOUNTER — Other Ambulatory Visit: Payer: Self-pay

## 2024-01-15 ENCOUNTER — Other Ambulatory Visit: Payer: Self-pay | Admitting: Behavioral Health

## 2024-01-15 DIAGNOSIS — F5105 Insomnia due to other mental disorder: Secondary | ICD-10-CM

## 2024-01-15 DIAGNOSIS — I824Z2 Acute embolism and thrombosis of unspecified deep veins of left distal lower extremity: Secondary | ICD-10-CM

## 2024-01-15 MED ORDER — WARFARIN SODIUM 1 MG PO TABS
ORAL_TABLET | ORAL | 0 refills | Status: DC
Start: 2024-01-15 — End: 2024-02-12

## 2024-01-17 ENCOUNTER — Other Ambulatory Visit: Payer: Self-pay | Admitting: Neurology

## 2024-01-24 ENCOUNTER — Ambulatory Visit: Admitting: Behavioral Health

## 2024-01-24 ENCOUNTER — Encounter: Payer: Self-pay | Admitting: Behavioral Health

## 2024-01-24 DIAGNOSIS — R5381 Other malaise: Secondary | ICD-10-CM

## 2024-01-24 DIAGNOSIS — F5105 Insomnia due to other mental disorder: Secondary | ICD-10-CM | POA: Diagnosis not present

## 2024-01-24 DIAGNOSIS — F411 Generalized anxiety disorder: Secondary | ICD-10-CM | POA: Diagnosis not present

## 2024-01-24 DIAGNOSIS — F331 Major depressive disorder, recurrent, moderate: Secondary | ICD-10-CM

## 2024-01-24 DIAGNOSIS — F99 Mental disorder, not otherwise specified: Secondary | ICD-10-CM

## 2024-01-24 MED ORDER — ESCITALOPRAM OXALATE 20 MG PO TABS: 20.0000 mg | ORAL_TABLET | Freq: Every day | ORAL | 1 refills | Status: DC

## 2024-01-24 MED ORDER — MIRTAZAPINE 7.5 MG PO TABS: ORAL_TABLET | ORAL | 0 refills | Status: DC

## 2024-01-24 MED ORDER — ARIPIPRAZOLE 5 MG PO TABS: 5.0000 mg | ORAL_TABLET | Freq: Every day | ORAL | 3 refills | Status: DC

## 2024-01-24 NOTE — Progress Notes (Signed)
 [facility] Med Check  Patient ID: Emma Lopez,  MRN: [id number]  PCP: Stephane Leita DEL, MD  Date of Evaluation: 01/24/2024 Time spent:30 minutes  Chief Complaint:  Chief Complaint   Depression; Anxiety; Follow-up; Medication Refill; Patient Education     HISTORY/CURRENT STATUS: HPI Emma Lopez, 78 year old female presents to this office for follow up and medication management. Says that she has been taking Lexapro  and Abilify  as prescribed since last visit.  Appears much more calm and collected than last visit.  Doing well on reduced dose of Abilify . Sleep has improved with use of Mirtazapine . Educated her on off label use of the drug for sleep and appetite at low doses. Still complaining of poor appetite. She says that she will be moving into [facility] for assisted living in 3 months. She will have more access to meals.  She reports depression today at 3/10, and anxiety at 3/10.  She is sleeping 7 to 8 hours per night.  She denies any history of mania, no psychosis, no auditory or visual hallucinations, no delirium.  Denies suicidal ideation or homicidal ideation. Pt feels safe and verbally contracts for safety.    Past psychiatric medication trials:  Lexapro  Cymbalta BuSpar Viibryd   Zoloft  Individual Medical History/ Review of Systems: Changes? :No   Allergies: Hydroxyzine  and Sulfa antibiotics  Current Medications:  Current Outpatient Medications:    ARIPiprazole  (ABILIFY ) 5 MG tablet, Take 1 tablet (5 mg total) by mouth daily., Disp: 30 tablet, Rfl: 3   escitalopram  (LEXAPRO ) 20 MG tablet, Take 1 tablet (20 mg total) by mouth daily., Disp: 90 tablet, Rfl: 1   Evolocumab  (REPATHA  SURECLICK) 140 MG/ML SOAJ, Inject 140 mg into the skin every 14 (fourteen) days. (Patient not taking: Reported on 12/06/2023), Disp: 2 mL, Rfl: 11   mirtazapine  (REMERON ) 7.5 MG tablet, 1-2 tablets by mouth at bedtime for sleep and appetite, Disp: 60 tablet, Rfl: 0   rOPINIRole  (REQUIP ) 1 MG tablet,  TAKE 1 TABLET BY MOUTH EVERYDAY AT BEDTIME, Disp: 90 tablet, Rfl: 0   Vitamin D , Ergocalciferol , (DRISDOL ) 1.25 MG (50000 UNIT) CAPS capsule, Take 50,000 Units by mouth every 30 (thirty) days., Disp: , Rfl:    warfarin (COUMADIN ) 1 MG tablet, TAKE ONE-HALF TABLET TO 1 TABLET BY MOUTH DAILY OR AS DIRECTED BY ANTICOAGULATION CLINIC., Disp: 75 tablet, Rfl: 0  Current Facility-Administered Medications:    denosumab  (PROLIA ) injection 60 mg, 60 mg, Subcutaneous, Once, Amundson C Silva, Brook E, MD Medication Side Effects: none  Family Medical/ Social History: Changes? No  MENTAL HEALTH EXAM:  There were no vitals taken for this visit.There is no height or weight on file to calculate BMI.  General Appearance: Casual, Neat, and Well Groomed  Eye Contact:  Good  Speech:  Clear and Coherent  Volume:  Normal  Mood:  NA  Affect:  Appropriate  Thought Process:  Coherent  Orientation:  Full (Time, Place, and Person)  Thought Content: Logical   Suicidal Thoughts:  No  Homicidal Thoughts:  No  Memory:  WNL  Judgement:  Good  Insight:  Good  Psychomotor Activity:  Normal  Concentration:  Concentration: Good  Recall:  Good  Fund of Knowledge: Good  Language: Good  Assets:  Desire for Improvement  ADL's:  Intact  Cognition: WNL  Prognosis:  Good    DIAGNOSES:    ICD-10-CM   1. Insomnia due to other mental disorder  F51.05 mirtazapine  (REMERON ) 7.5 MG tablet   F99     2. Major depressive disorder,  recurrent episode, moderate (HCC)  F33.1 escitalopram  (LEXAPRO ) 20 MG tablet    ARIPiprazole  (ABILIFY ) 5 MG tablet    3. Generalized anxiety disorder  F41.1 escitalopram  (LEXAPRO ) 20 MG tablet    ARIPiprazole  (ABILIFY ) 5 MG tablet    4. Chronic fatigue and malaise  R53.82 escitalopram  (LEXAPRO ) 20 MG tablet   R53.81       Receiving Psychotherapy: yes, Almarie Sprang   RECOMMENDATIONS:  Greater than 50% of  30  min face to face time with patient was spent on counseling and  coordination of care.  Discussed her report of moderate improvement and less side effects since reducing Abilify . Is sleeping better with Mirtazapine . Discussed her decision to move into assisted living in about 3 month. She is still seeing Almarie Sprang for therapy.  She had aid that assist her at home 3 days per week.      She agrees to continue as prescribed and will re assess in two months. We discussed her desire to stop coming in for appointments and I explained that these medications require supervision.    Recommended Mag Threonate supplement at bedtime.    We agreed to:   To continue with Lexapro  20 mg daily To continue Abilify  2.5  mg  daily at bedtime  Continue  Mirtazapine  7.5-15  mg at bedtime for insomnia Will report side effects or worsening symptoms promptly Will follow-up in 8 weeks to reassess per pt Provided emergency contact information Reviewed PDMP      Emma DELENA Pizza, NP

## 2024-01-26 ENCOUNTER — Ambulatory Visit: Attending: Cardiology

## 2024-01-26 ENCOUNTER — Encounter

## 2024-01-26 DIAGNOSIS — Z5181 Encounter for therapeutic drug level monitoring: Secondary | ICD-10-CM | POA: Diagnosis not present

## 2024-01-26 DIAGNOSIS — I82401 Acute embolism and thrombosis of unspecified deep veins of right lower extremity: Secondary | ICD-10-CM | POA: Diagnosis not present

## 2024-01-26 LAB — POCT INR: INR: 2.5 (ref 2.0–3.0)

## 2024-01-26 NOTE — Progress Notes (Signed)
Please see anticoagulation encounter.

## 2024-01-26 NOTE — Patient Instructions (Signed)
 Continue taking Warfarin 1 tablet daily except for 1/2 tablet on Sundays and Thursdays.  Recheck INR 6 weeks.  Coumadin Clinic [phone]

## 2024-02-12 ENCOUNTER — Other Ambulatory Visit: Payer: Self-pay | Admitting: Cardiology

## 2024-02-12 DIAGNOSIS — I824Z2 Acute embolism and thrombosis of unspecified deep veins of left distal lower extremity: Secondary | ICD-10-CM

## 2024-02-21 DIAGNOSIS — Z1231 Encounter for screening mammogram for malignant neoplasm of breast: Secondary | ICD-10-CM | POA: Diagnosis not present

## 2024-02-21 LAB — HM MAMMOGRAPHY

## 2024-02-23 ENCOUNTER — Encounter: Payer: Self-pay | Admitting: Obstetrics and Gynecology

## 2024-02-27 ENCOUNTER — Ambulatory Visit: Payer: Self-pay | Admitting: Obstetrics and Gynecology

## 2024-03-03 ENCOUNTER — Other Ambulatory Visit: Payer: Self-pay | Admitting: Behavioral Health

## 2024-03-03 DIAGNOSIS — F5105 Insomnia due to other mental disorder: Secondary | ICD-10-CM

## 2024-03-08 ENCOUNTER — Ambulatory Visit: Attending: Cardiology

## 2024-03-08 DIAGNOSIS — I82402 Acute embolism and thrombosis of unspecified deep veins of left lower extremity: Secondary | ICD-10-CM

## 2024-03-08 DIAGNOSIS — Z5181 Encounter for therapeutic drug level monitoring: Secondary | ICD-10-CM

## 2024-03-08 LAB — POCT INR: INR: 3.4 — AB (ref 2.0–3.0)

## 2024-03-08 NOTE — Progress Notes (Signed)
 INR 3.4  Please see anticoagulation encounter

## 2024-03-08 NOTE — Patient Instructions (Signed)
 Description   Continue taking Warfarin 1 tablet daily except for 1/2 tablet on Sundays and Thursdays.  Recheck INR 6 weeks.  Coumadin  Clinic [phone]

## 2024-03-13 DIAGNOSIS — H52203 Unspecified astigmatism, bilateral: Secondary | ICD-10-CM | POA: Diagnosis not present

## 2024-03-13 DIAGNOSIS — H35372 Puckering of macula, left eye: Secondary | ICD-10-CM | POA: Diagnosis not present

## 2024-03-13 DIAGNOSIS — H401411 Capsular glaucoma with pseudoexfoliation of lens, right eye, mild stage: Secondary | ICD-10-CM | POA: Diagnosis not present

## 2024-03-13 DIAGNOSIS — H5203 Hypermetropia, bilateral: Secondary | ICD-10-CM | POA: Diagnosis not present

## 2024-03-13 DIAGNOSIS — H2513 Age-related nuclear cataract, bilateral: Secondary | ICD-10-CM | POA: Diagnosis not present

## 2024-03-20 ENCOUNTER — Ambulatory Visit: Admitting: Professional Counselor

## 2024-03-22 ENCOUNTER — Telehealth: Payer: Self-pay | Admitting: Behavioral Health

## 2024-03-22 NOTE — Telephone Encounter (Signed)
 Next visit is 03/29/24. Requesting refill on Remeron  7.5 mg called to:  [id number] [address] [place], [place] - [address]   Phone: [phone]  Fax: [phone]

## 2024-03-22 NOTE — Telephone Encounter (Signed)
 A 90-day supply was sent on 8/3. Notified patient. She reports she was confused about what she needed a RF on. Does not need anything at this time.

## 2024-03-27 ENCOUNTER — Ambulatory Visit: Admitting: Behavioral Health

## 2024-03-29 ENCOUNTER — Ambulatory Visit (INDEPENDENT_AMBULATORY_CARE_PROVIDER_SITE_OTHER): Admitting: Behavioral Health

## 2024-03-29 ENCOUNTER — Encounter: Payer: Self-pay | Admitting: Behavioral Health

## 2024-03-29 DIAGNOSIS — F331 Major depressive disorder, recurrent, moderate: Secondary | ICD-10-CM

## 2024-03-29 DIAGNOSIS — F99 Mental disorder, not otherwise specified: Secondary | ICD-10-CM

## 2024-03-29 DIAGNOSIS — F332 Major depressive disorder, recurrent severe without psychotic features: Secondary | ICD-10-CM

## 2024-03-29 DIAGNOSIS — F411 Generalized anxiety disorder: Secondary | ICD-10-CM

## 2024-03-29 DIAGNOSIS — F5105 Insomnia due to other mental disorder: Secondary | ICD-10-CM | POA: Diagnosis not present

## 2024-03-29 NOTE — Progress Notes (Signed)
 [facility] Check  Patient ID: Emma Lopez,  MRN: [id number]  PCP: Stephane Leita DEL, MD  Date of Evaluation: 03/29/2024 Time spent:30 minutes  Chief Complaint:  Chief Complaint   Anxiety; Depression; Follow-up; Medication Refill; Patient Education     HISTORY/CURRENT STATUS: HPI Emma Lopez, 78 year old female presents to this office for follow up and medication management. Says that she has been taking Lexapro  and Abilify  as prescribed since last visit. Taking 2.5 mg of Abilify .  This visit appears distressed. She has increased anxiety about her pending move to retirement community in Oct. Already had orientation at facility. Still having decreased appetite and would like to increase Mirtazapine .  Doing well on reduced dose of Abilify . Sleep is good. Educated her on off label use of the drug for sleep and appetite at low doses. Still complaining of poor appetite.  She reports depression today at 4/10, and anxiety at 5/10.  She is sleeping 7 to 8 hours per night.  She denies any history of mania, no psychosis, no auditory or visual hallucinations, no delirium.  Denies suicidal ideation or homicidal ideation. Pt feels safe and verbally contracts for safety.    Past psychiatric medication trials:  Lexapro  Cymbalta BuSpar Viibryd   Zoloft  Individual Medical History/ Review of Systems: Changes? :No   Allergies: Hydroxyzine  and Sulfa antibiotics  Current Medications:  Current Outpatient Medications:    mirtazapine  (REMERON ) 7.5 MG tablet, 1-2 TABLETS BY MOUTH AT BEDTIME FOR SLEEP AND APPETITE, Disp: 180 tablet, Rfl: 0   ARIPiprazole  (ABILIFY ) 5 MG tablet, Take 1 tablet (5 mg total) by mouth daily., Disp: 30 tablet, Rfl: 3   escitalopram  (LEXAPRO ) 20 MG tablet, Take 1 tablet (20 mg total) by mouth daily., Disp: 90 tablet, Rfl: 1   Evolocumab  (REPATHA  SURECLICK) 140 MG/ML SOAJ, Inject 140 mg into the skin every 14 (fourteen) days. (Patient not taking: Reported on 12/06/2023), Disp: 2 mL,  Rfl: 11   rOPINIRole  (REQUIP ) 1 MG tablet, TAKE 1 TABLET BY MOUTH EVERYDAY AT BEDTIME, Disp: 90 tablet, Rfl: 0   Vitamin D , Ergocalciferol , (DRISDOL ) 1.25 MG (50000 UNIT) CAPS capsule, Take 50,000 Units by mouth every 30 (thirty) days., Disp: , Rfl:    warfarin (COUMADIN ) 1 MG tablet, TAKE ONE-HALF TABLET TO 1 TABLET BY MOUTH DAILY OR AS DIRECTED BY ANTICOAGULATION CLINIC., Disp: 75 tablet, Rfl: 0  Current Facility-Administered Medications:    denosumab  (PROLIA ) injection 60 mg, 60 mg, Subcutaneous, Once, Amundson C Silva, Brook E, MD Medication Side Effects: none  Family Medical/ Social History: Changes? No  MENTAL HEALTH EXAM:  There were no vitals taken for this visit.There is no height or weight on file to calculate BMI.  General Appearance: Casual  Eye Contact:  Good  Speech:  Clear and Coherent  Volume:  Normal  Mood:  Anxious, Depressed, and Dysphoric  Affect:  Depressed, Flat, and Anxious  Thought Process:  Coherent  Orientation:  Full (Time, Place, and Person)  Thought Content: Logical   Suicidal Thoughts:  No  Homicidal Thoughts:  No  Memory:  Recent;   Fair  Judgement:  Fair  Insight:  Good  Psychomotor Activity:  Normal  Concentration:  Concentration: Good  Recall:  Good  Fund of Knowledge: Fair  Language: Good  Assets:  Desire for Improvement Physical Health Resilience Social Support  ADL's:  Intact  Cognition: WNL  Prognosis:  Fair    DIAGNOSES:    ICD-10-CM   1. Major depressive disorder, recurrent episode, moderate (HCC)  F33.1  2. Insomnia due to other mental disorder  F51.05    F99     3. Generalized anxiety disorder  F41.1     4. Severe episode of recurrent major depressive disorder, without psychotic features (HCC)  F33.2       Receiving Psychotherapy: No    RECOMMENDATIONS:   Greater than 50% of 30  min face to face time with patient was spent on counseling and coordination of care.  Discussed her report of moderate improvement and  less side effects since reducing Abilify .  She has still been persistent on finding cause of reported hot flashes. I reminded her that she was complaining of hot flashed prior to starting any psychiatric medication and has consulted with multiple providers.  Medication is most likely not the cause. Is sleeping better with Mirtazapine . Discussed her decision to move into assisted living in Oct. She is anxious but looking forward to more socialization.  She is still seeing Almarie Sprang for therapy.  She had aid that assist her at home 3 days per week.      She agrees to continue as prescribed and will re assess in two months. We discussed her desire to stop coming in for appointments and I explained that these medications require supervision.    Recommended  continuing Mag Threonate supplement at bedtime.    We agreed to:   To continue with Lexapro  20 mg daily To continue Abilify  2.5  mg  daily at bedtime  Will increase to Mirtazapine  15  mg at bedtime for insomnia Will report side effects or worsening symptoms promptly Will follow-up in 10  weeks to reassess per pt Provided emergency contact information Reviewed PDMP       Redell DELENA Pizza, NP

## 2024-04-03 ENCOUNTER — Ambulatory Visit: Admitting: Professional Counselor

## 2024-04-19 ENCOUNTER — Ambulatory Visit: Attending: Cardiology

## 2024-04-19 DIAGNOSIS — I82401 Acute embolism and thrombosis of unspecified deep veins of right lower extremity: Secondary | ICD-10-CM | POA: Diagnosis not present

## 2024-04-19 DIAGNOSIS — Z5181 Encounter for therapeutic drug level monitoring: Secondary | ICD-10-CM | POA: Diagnosis not present

## 2024-04-19 LAB — POCT INR: INR: 3 (ref 2.0–3.0)

## 2024-04-19 NOTE — Patient Instructions (Signed)
 Continue taking Warfarin 1 tablet daily except for 1/2 tablet on Sundays and Thursdays.  Recheck INR 6 weeks.  Coumadin Clinic [phone]

## 2024-04-19 NOTE — Progress Notes (Signed)
 INR 3.0 Please see anticoagulation encounter Continue taking Warfarin 1 tablet daily except for 1/2 tablet on Sundays and Thursdays.  Recheck INR 6 weeks.  Coumadin  Clinic [phone]

## 2024-05-17 ENCOUNTER — Encounter: Payer: Self-pay | Admitting: Neurology

## 2024-05-17 ENCOUNTER — Ambulatory Visit (INDEPENDENT_AMBULATORY_CARE_PROVIDER_SITE_OTHER): Admitting: Neurology

## 2024-05-17 VITALS — BP 127/69 | HR 49 | Ht 60.0 in | Wt 121.0 lb

## 2024-05-17 DIAGNOSIS — R4589 Other symptoms and signs involving emotional state: Secondary | ICD-10-CM | POA: Diagnosis not present

## 2024-05-17 DIAGNOSIS — R5381 Other malaise: Secondary | ICD-10-CM | POA: Diagnosis not present

## 2024-05-17 DIAGNOSIS — G2581 Restless legs syndrome: Secondary | ICD-10-CM

## 2024-05-17 DIAGNOSIS — R5382 Chronic fatigue, unspecified: Secondary | ICD-10-CM

## 2024-05-17 DIAGNOSIS — G3184 Mild cognitive impairment, so stated: Secondary | ICD-10-CM | POA: Insufficient documentation

## 2024-05-17 DIAGNOSIS — F418 Other specified anxiety disorders: Secondary | ICD-10-CM

## 2024-05-17 MED ORDER — ROPINIROLE HCL 1 MG PO TABS: 1.0000 mg | ORAL_TABLET | Freq: Every day | ORAL | 1 refills | Status: AC

## 2024-05-17 NOTE — Progress Notes (Signed)
 Patient: Emma Lopez Date of Birth: June 09, 1946  Reason for Visit: Follow up History from: Patient Primary Neurologist: Dohmeier  ASSESSMENT AND PLAN 78 y.o. year old female   1.  Mild cognitive impairment 2.  RLS 3.  Anxiety 4.  Depression  Recently within the last week moved into independent living at [facility]. Has chronic difficulty with anxiety and depression. Also mentions memory concern, trouble with word retrieval.  MoCA 23/30. A-T+N-.  MRI of the brain showed moderate stable generalized atrophy.  Canceled appointment for neuropsychological evaluation last year. Would like to revisit.  - I will again place referral for neuropsychological evaluation to better characterize memory deficits - RLS under excellent control, continue Requip  1 mg as needed at bedtime - Continue follow-up with primary care for management of vascular risk factors - Follow-up in 1 year with Dr. Chalice  HISTORY OF PRESENT ILLNESS: Today 05/17/24 Last visit with Dr. Chalice, Requip  was refilled.  MRI of the brain showed stable moderate generalized atrophy. Dr. Ines going to do EMG to evaluate for neuropathy.  Referral to neuropsychiatry, patient felt she has ADD, Dr. Chalice felt depression, anxiety. Patient cancelled memory evaluation with Dr. Corina, she panicked. Today, MOCA 23/30. Needs refill on Requip , main reason she came. Has been taking Requip  1 mg PRN. Takes 2-3 night a week. Concerned about her memory, 1 week ago moved to [employer] [facility], learning a new routine. Takes longer to retrieve names/words, waits and comes to her. Health has been good, in a way moving to retirement community is a relief, had no interest in cooking for herself, gaining weight back. Very limited driving in her neighborhood. Her caregiver brought her today. A-T+N-. No longer having paresthesia in her legs, EMG not needed.   HISTORY  11/17/22 Dr. Chalice HPI:  Emma Lopez is a 78 y.o. female and  seen here upon referral from Dr. Stephane for a Consultation/ Evaluation of tingling, numbness in both legs. She reports also high degree of fatigue - not new- not daytime sleepiness , but at the same time an inability to fall asleep when she could nap.  She reports her requip  is the only medication that can control her restless limbs, This patient reports onset of nocturnal spells of dyseasthesias over a period of 6 months. Right arm pain, right leg some sciatica. She is wondering about Neuropathy , Fibromyalgia, and she appears anxious. Had DVT  in both legs. Had PE.  Positive for lupus anticoagulant.   She wakes up at night with pressure in the chest, limbs are tingling and burning, and she  feels relief after taking Xanax .   REVIEW OF SYSTEMS: Out of a complete 14 system review of symptoms, the patient complains only of the following symptoms, and all other reviewed systems are negative.  See HPI  ALLERGIES: Allergies  Allergen Reactions   Hydroxyzine  Other (See Comments)    Vertigo    Sulfa Antibiotics Rash    HOME MEDICATIONS: Outpatient Medications Prior to Visit  Medication Sig Dispense Refill   ARIPiprazole  (ABILIFY ) 5 MG tablet Take 1 tablet (5 mg total) by mouth daily. (Patient taking differently: Take 2.5 mg by mouth daily.) 30 tablet 3   escitalopram  (LEXAPRO ) 20 MG tablet Take 1 tablet (20 mg total) by mouth daily. 90 tablet 1   mirtazapine  (REMERON ) 7.5 MG tablet 1-2 TABLETS BY MOUTH AT BEDTIME FOR SLEEP AND APPETITE 180 tablet 0   rOPINIRole  (REQUIP ) 1 MG tablet TAKE 1 TABLET BY MOUTH  EVERYDAY AT BEDTIME (Patient taking differently: As needed) 90 tablet 0   Vitamin D , Ergocalciferol , (DRISDOL ) 1.25 MG (50000 UNIT) CAPS capsule Take 50,000 Units by mouth every 30 (thirty) days.     warfarin (COUMADIN ) 1 MG tablet TAKE ONE-HALF TABLET TO 1 TABLET BY MOUTH DAILY OR AS DIRECTED BY ANTICOAGULATION CLINIC. 75 tablet 0   Evolocumab  (REPATHA  SURECLICK) 140 MG/ML SOAJ Inject 140 mg into  the skin every 14 (fourteen) days. (Patient not taking: Reported on 05/17/2024) 2 mL 11   Facility-Administered Medications Prior to Visit  Medication Dose Route Frequency Provider Last Rate Last Admin   denosumab  (PROLIA ) injection 60 mg  60 mg Subcutaneous Once Cathlyn JAYSON Nikki Bobie FORBES, MD        PAST MEDICAL HISTORY: Past Medical History:  Diagnosis Date   Abnormal EKG    Anxiety    Arthritis    oa   Bradycardia 12/14/2017   CIN III (cervical intraepithelial neoplasia III) 2000   Complication of anesthesia    did well last 2 times with procedures   Depression    DES exposure in utero    DVT (deep venous thrombosis) (HCC) 01/2009   LEFT LEG   DVT (deep venous thrombosis) (HCC) 10/30/2020   Dyspnea on exertion 10/2017   Elevated triglycerides with high cholesterol    Endometriosis    Fibromyalgia    GERD (gastroesophageal reflux disease)    History of colon polyps    History of hiatal hernia    told by some md has, some say not   IBS (irritable bowel syndrome) 2015   Left bundle branch block    intermittent   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   Lupus anticoagulant disorder    Multinodular goiter    Osteoporosis 12/2017   T score -3.2 stable from prior study   PE (pulmonary embolism)    Pneumonia 03/01/2021   PONV (postoperative nausea and vomiting)    Restless legs syndrome    Sinus bradycardia    Sleep disorder    Thyroid  disease    HYPERTHYROIDISM.  low TSH 0.22 on 03/24/23.   Vitamin D  deficiency     PAST SURGICAL HISTORY: Past Surgical History:  Procedure Laterality Date   ABDOMINAL HYSTERECTOMY  2001   TAH, partial   APPENDECTOMY  1978   COLONOSCOPY WITH PROPOFOL  N/A 12/07/2015   Procedure: COLONOSCOPY WITH PROPOFOL ;  Surgeon: Gladis MARLA Louder, MD;  Location: [facility];  Service: Endoscopy;  Laterality: N/A;   COLONOSCOPY WITH PROPOFOL  N/A 01/11/2022   Procedure: COLONOSCOPY WITH PROPOFOL ;  Surgeon: Saintclair Jasper, MD;   Location: [facility];  Service: Gastroenterology;  Laterality: N/A;   colonscopy  7 yrs ago   other in past   ESOPHAGOGASTRODUODENOSCOPY (EGD) WITH PROPOFOL  N/A 12/07/2015   Procedure: ESOPHAGOGASTRODUODENOSCOPY (EGD) WITH PROPOFOL ;  Surgeon: Gladis MARLA Louder, MD;  Location: [facility];  Service: Endoscopy;  Laterality: N/A;   ESOPHAGOGASTRODUODENOSCOPY ENDOSCOPY  yrs ago   FACIAL COSMETIC SURGERY     GYNECOLOGIC CRYOSURGERY     HEMOSTASIS CLIP PLACEMENT  01/11/2022   Procedure: HEMOSTASIS CLIP PLACEMENT;  Surgeon: Saintclair Jasper, MD;  Location: [facility];  Service: Gastroenterology;;   HOT HEMOSTASIS N/A 01/11/2022   Procedure: HOT HEMOSTASIS (ARGON PLASMA COAGULATION/BICAP);  Surgeon: Saintclair Jasper, MD;  Location: [facility];  Service: Gastroenterology;  Laterality: N/A;   MYOMECTOMY     POLYPECTOMY  01/11/2022   Procedure: POLYPECTOMY;  Surgeon: Saintclair Jasper, MD;  Location: [facility];  Service: Gastroenterology;;   REPLACEMENT TOTAL JOINT WRIST W/ PROSTHETIC IMPLANT Right 08/01/2021   TONSILLECTOMY      FAMILY HISTORY: Family History  Problem Relation Age of Onset   Depression Mother    Anxiety disorder Mother    Hypertension Mother    Breast cancer Mother        3's   Cancer Father        COLON   Cancer Sister        ovarian cancer   Depression Sister    Anxiety disorder Sister    Hypertension Sister    Stroke Sister    Breast cancer Maternal Aunt        Age 76's   Cancer Maternal Aunt        Melanoma   Alzheimer's disease Maternal Aunt    Breast cancer Maternal Aunt        70's   Cancer Maternal Aunt        Colon CA   Alzheimer's disease Maternal Aunt    Cancer Paternal Aunt        OVARIAN and COLON    SOCIAL HISTORY: Social History   Socioeconomic History   Marital status: Single    Spouse name: Not on file   Number of children: 0   Years of education: 16   Highest education level: Bachelor's degree (e.g., BA, AB, BS)  Occupational History    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  Tobacco Use   Smoking status: Former    Current packs/day: 0.00    Average packs/day: 0.1 packs/day for 6.0 years (0.6 ttl pk-yrs)    Types: Cigarettes    Start date: 08/01/1972    Quit date: 08/01/1978    Years since quitting: 45.8   Smokeless tobacco: Never   Tobacco comments:    only smoked 4-6 yrs 1 pack per month  Vaping Use   Vaping status: Never Used  Substance and Sexual Activity   Alcohol  use: Not Currently   Drug use: No   Sexual activity: Not Currently    Birth control/protection: Surgical    Comment: HYSTERECTOMY-1st intercourse 27-Fewer than 5 partners  Other Topics Concern   Not on file  Social History Narrative   Moved to a retirement facility, [facility], in independent living   Social Drivers of Health   Financial Resource Strain: Not on file  Food Insecurity: Food Insecurity Present (04/29/2023)   Hunger Vital Sign    Worried About Running Out of Food in the Last Year: Not on file    Ran Out of Food in the Last Year: Sometimes true  Transportation Needs: No Transportation Needs (04/29/2023)   PRAPARE - Administrator, Civil Service (Medical): No    Lack of Transportation (Non-Medical): No  Physical Activity: Not on file  Stress: Not on file  Social Connections: Not on file  Intimate Partner Violence: Not At Risk (04/29/2023)   Humiliation, Afraid, Rape, and Kick questionnaire    Fear of Current or Ex-Partner: No    Emotionally Abused: No    Physically Abused: No    Sexually Abused: No    PHYSICAL EXAM  Vitals:   05/17/24 1033  BP: 127/69  Pulse: (!) 49  SpO2: 97%  Weight: 121 lb (54.9 kg)  Height: 5' (1.524 m)   Body mass index is 23.63 kg/m.  Generalized: Well developed, in no acute distress, anxious Neurological examination  Mentation: Alert oriented to time, place, history taking. Follows all commands  speech and language fluent Cranial nerve II-XII: Pupils were equal  round reactive to light. Extraocular movements were full, visual field were full on confrontational test. Facial sensation and strength were normal. Head turning and shoulder shrug  were normal and symmetric. Motor: The motor testing reveals 5 over 5 strength of all 4 extremities. Good symmetric motor tone is noted throughout.  Sensory: Sensory testing is intact to soft touch on all 4 extremities. No evidence of extinction is noted.  Coordination: Cerebellar testing reveals good finger-nose-finger and heel-to-shin bilaterally.  Gait and station: Gait is wide-based, but independent Reflexes: Deep tendon reflexes are symmetric and normal bilaterally.   DIAGNOSTIC DATA (LABS, IMAGING, TESTING) - I reviewed patient records, labs, notes, testing and imaging myself where available.  Lab Results  Component Value Date   WBC 7.8 12/06/2023   HGB 15.2 (H) 12/06/2023   HCT 45.4 12/06/2023   MCV 95.4 12/06/2023   PLT 247 12/06/2023      Component Value Date/Time   NA 142 12/06/2023 1402   NA 142 01/03/2018 1601   K 4.2 12/06/2023 1402   CL 104 12/06/2023 1402   CO2 32 12/06/2023 1402   GLUCOSE 94 12/06/2023 1402   BUN 26 (H) 12/06/2023 1402   BUN 20 01/03/2018 1601   CREATININE 0.83 12/06/2023 1402   CALCIUM  9.1 12/06/2023 1402   PROT 6.8 12/06/2023 1402   ALBUMIN 4.2 12/06/2023 1402   AST 19 12/06/2023 1402   ALT 19 12/06/2023 1402   ALKPHOS 62 12/06/2023 1402   BILITOT 0.2 12/06/2023 1402   GFRNONAA >60 12/06/2023 1402   GFRAA 103 01/03/2018 1601   Lab Results  Component Value Date   CHOL 138 05/04/2023   HDL 61 05/04/2023   LDLCALC 66 05/04/2023   TRIG 57 05/04/2023   CHOLHDL 2.3 05/04/2023   Lab Results  Component Value Date   HGBA1C 5.6 05/04/2023   Lab Results  Component Value Date   VITAMINB12 745 05/06/2023   Lab Results  Component Value Date   TSH 0.22 (L) 03/24/2023    Lauraine Born, AGNP-C, DNP 05/17/2024, 10:54 AM [facility] [address], [address] [place], [place] [address] [phone]

## 2024-05-17 NOTE — Patient Instructions (Signed)
 Referral for neuro psych evaluation  Continue Requip 

## 2024-05-20 ENCOUNTER — Telehealth: Payer: Self-pay | Admitting: Neurology

## 2024-05-20 NOTE — Telephone Encounter (Signed)
 Referral for neuropsychology fax to [facility]. Phone: [phone], Fax: [phone]

## 2024-05-24 ENCOUNTER — Ambulatory Visit: Admitting: Neurology

## 2024-05-27 ENCOUNTER — Other Ambulatory Visit: Payer: Self-pay | Admitting: Behavioral Health

## 2024-05-27 DIAGNOSIS — F5105 Insomnia due to other mental disorder: Secondary | ICD-10-CM

## 2024-05-31 ENCOUNTER — Ambulatory Visit: Attending: Cardiology

## 2024-05-31 DIAGNOSIS — Z5181 Encounter for therapeutic drug level monitoring: Secondary | ICD-10-CM | POA: Diagnosis not present

## 2024-05-31 DIAGNOSIS — I82401 Acute embolism and thrombosis of unspecified deep veins of right lower extremity: Secondary | ICD-10-CM | POA: Diagnosis not present

## 2024-05-31 LAB — POCT INR: INR: 2.1 (ref 2.0–3.0)

## 2024-05-31 NOTE — Patient Instructions (Signed)
 Take 1.5 tablets today only then Continue taking Warfarin 1 tablet daily except for 1/2 tablet on Sundays and Thursdays.  Recheck INR 3 weeks.  Coumadin  Clinic [phone]

## 2024-05-31 NOTE — Progress Notes (Signed)
 INR 2.1 Please see anticoagulation encounter Take 1.5 tablets today only then Continue taking Warfarin 1 tablet daily except for 1/2 tablet on Sundays and Thursdays.  Recheck INR 3 weeks.  Coumadin  Clinic [phone]

## 2024-06-05 ENCOUNTER — Encounter: Payer: Self-pay | Admitting: Hematology & Oncology

## 2024-06-05 ENCOUNTER — Inpatient Hospital Stay: Attending: Hematology & Oncology

## 2024-06-05 ENCOUNTER — Inpatient Hospital Stay: Admitting: Hematology & Oncology

## 2024-06-05 VITALS — BP 128/51 | HR 42 | Temp 98.2°F | Resp 16 | Wt 124.0 lb

## 2024-06-05 DIAGNOSIS — Z79899 Other long term (current) drug therapy: Secondary | ICD-10-CM | POA: Diagnosis not present

## 2024-06-05 DIAGNOSIS — Z7901 Long term (current) use of anticoagulants: Secondary | ICD-10-CM | POA: Diagnosis not present

## 2024-06-05 DIAGNOSIS — D6862 Lupus anticoagulant syndrome: Secondary | ICD-10-CM | POA: Diagnosis not present

## 2024-06-05 DIAGNOSIS — I824Z2 Acute embolism and thrombosis of unspecified deep veins of left distal lower extremity: Secondary | ICD-10-CM

## 2024-06-05 DIAGNOSIS — Z86718 Personal history of other venous thrombosis and embolism: Secondary | ICD-10-CM | POA: Insufficient documentation

## 2024-06-05 DIAGNOSIS — F418 Other specified anxiety disorders: Secondary | ICD-10-CM

## 2024-06-05 DIAGNOSIS — I82402 Acute embolism and thrombosis of unspecified deep veins of left lower extremity: Secondary | ICD-10-CM

## 2024-06-05 DIAGNOSIS — I2782 Chronic pulmonary embolism: Secondary | ICD-10-CM | POA: Diagnosis not present

## 2024-06-05 LAB — CMP (CANCER CENTER ONLY)
ALT: 21 U/L (ref 0–44)
AST: 26 U/L (ref 15–41)
Albumin: 4.1 g/dL (ref 3.5–5.0)
Alkaline Phosphatase: 76 U/L (ref 38–126)
Anion gap: 10 (ref 5–15)
BUN: 17 mg/dL (ref 8–23)
CO2: 29 mmol/L (ref 22–32)
Calcium: 9.2 mg/dL (ref 8.9–10.3)
Chloride: 105 mmol/L (ref 98–111)
Creatinine: 0.73 mg/dL (ref 0.44–1.00)
GFR, Estimated: >60 mL/min (ref >60)
Glucose, Bld: 81 mg/dL (ref 70–99)
Potassium: 4.4 mmol/L (ref 3.5–5.1)
Sodium: 144 mmol/L (ref 135–145)
Total Bilirubin: 0.3 mg/dL (ref 0.0–1.2)
Total Protein: 6.7 g/dL (ref 6.5–8.1)

## 2024-06-05 LAB — CBC WITH DIFFERENTIAL (CANCER CENTER ONLY)
Abs Immature Granulocytes: 0.03 K/uL (ref 0.00–0.07)
Basophils Absolute: 0.1 K/uL (ref 0.0–0.1)
Basophils Relative: 1 %
Eosinophils Absolute: 0.1 K/uL (ref 0.0–0.5)
Eosinophils Relative: 2 %
HCT: 43.6 % (ref 36.0–46.0)
Hemoglobin: 14.4 g/dL (ref 12.0–15.0)
Immature Granulocytes: 0 %
Lymphocytes Relative: 26 %
Lymphs Abs: 1.7 K/uL (ref 0.7–4.0)
MCH: 32 pg (ref 26.0–34.0)
MCHC: 33 g/dL (ref 30.0–36.0)
MCV: 96.9 fL (ref 80.0–100.0)
Monocytes Absolute: 0.7 K/uL (ref 0.1–1.0)
Monocytes Relative: 10 %
Neutro Abs: 4.1 K/uL (ref 1.7–7.7)
Neutrophils Relative %: 61 %
Platelet Count: 203 K/uL (ref 150–400)
RBC: 4.5 MIL/uL (ref 3.87–5.11)
RDW: 13.9 % (ref 11.5–15.5)
WBC Count: 6.8 K/uL (ref 4.0–10.5)
nRBC: 0 % (ref 0.0–0.2)

## 2024-06-05 LAB — PROTIME-INR
INR: 2.7 — ABNORMAL HIGH (ref 0.8–1.2)
Prothrombin Time: 29.9 s — ABNORMAL HIGH (ref 11.4–15.2)

## 2024-06-05 NOTE — Progress Notes (Signed)
 Hematology and Oncology Follow Up Visit  Emma Lopez [id number] 06-06-1946 78 y.o. 06/05/2024   Principle Diagnosis:  Recurrent DVT of the left leg -- probable progression Positive lupus anticoagulant   Past Therapy: Xarelto  20 mg p.o. daily -- d/c on 01/06/2021 Pradaxa  150 mg po BID - start on 01/06/2021 - d/c on 01/25/2021   Current Therapy:        Coumadin   -- dosed by Coumadin  Clinic -- keep INR 2.5-3.5   Interim History:  [name] is here today for follow-up.  She still wants to come back to see us .  She now lives that [facility].  She does n enjoy it out there.  She still has her car.  She still has an aide that can help.  As far as her blood thinner is concerned, she is doing well with the Coumadin .  However, she cannot eat a lot of salads that they have out of the Home.  I know this bothers her a little bit.  She has had no cough or shortness of breath.  She has had no change in bowel or bladder habits.  I know that depression has been a problem in the past.  She seems to be managing this fairly well.  She has had no chest pain.  She has had no cough.  She has had no nausea or vomiting.  Overall, I will say that her performance status is probably ECOG 1.     Medications:  Allergies as of 06/05/2024       Reactions   Hydroxyzine  Other (See Comments)   Vertigo   Sulfa Antibiotics Rash        Medication List        Accurate as of June 05, 2024 12:58 PM. If you have any questions, ask your nurse or doctor.          STOP taking these medications    Repatha  SureClick 140 MG/ML Soaj Generic drug: Evolocumab  Stopped by: Maude JONELLE Crease       TAKE these medications    ARIPiprazole  5 MG tablet Commonly known as: ABILIFY  Take 1 tablet (5 mg total) by mouth daily. What changed: how much to take   escitalopram  20 MG tablet Commonly known as: LEXAPRO  Take 1 tablet (20 mg total) by mouth daily.   mirtazapine  7.5 MG tablet Commonly known as:  REMERON  TAKE 1-2 TABLETS BY MOUTH AT BEDTIME FOR SLEEP AND APPETITE   rOPINIRole  1 MG tablet Commonly known as: REQUIP  Take 1 tablet (1 mg total) by mouth at bedtime. TAKE 1 TABLET BY MOUTH EVERYDAY AT BEDTIME   Vitamin D  (Ergocalciferol ) 1.25 MG (50000 UNIT) Caps capsule Commonly known as: DRISDOL  Take 50,000 Units by mouth every 30 (thirty) days.   warfarin 1 MG tablet Commonly known as: COUMADIN  Take as directed by the anticoagulation clinic. If you are unsure how to take this medication, talk to your nurse or doctor. Original instructions: TAKE ONE-HALF TABLET TO 1 TABLET BY MOUTH DAILY OR AS DIRECTED BY ANTICOAGULATION CLINIC.        Allergies:  Allergies  Allergen Reactions   Hydroxyzine  Other (See Comments)    Vertigo    Sulfa Antibiotics Rash    Past Medical History, Surgical history, Social history, and Family History were reviewed and updated.  Review of Systems: Review of Systems  Constitutional: Negative.   HENT: Negative.    Eyes: Negative.   Respiratory: Negative.    Cardiovascular:  Positive for leg swelling.  Gastrointestinal:  Negative.   Genitourinary: Negative.   Musculoskeletal: Negative.   Skin: Negative.   Neurological: Negative.   Endo/Heme/Allergies: Negative.   Psychiatric/Behavioral:  Positive for depression.      Physical Exam:  weight is 124 lb (56.2 kg). Her oral temperature is 98.2 F (36.8 C). Her blood pressure is 128/51 (abnormal) and her pulse is 42 (abnormal). Her respiration is 16 and oxygen saturation is 99%.   Wt Readings from Last 3 Encounters:  06/05/24 124 lb (56.2 kg)  05/17/24 121 lb (54.9 kg)  12/12/23 113 lb (51.3 kg)   Physical Exam Vitals reviewed.  HENT:     Head: Normocephalic and atraumatic.  Eyes:     Pupils: Pupils are equal, round, and reactive to light.  Cardiovascular:     Rate and Rhythm: Normal rate and regular rhythm.     Heart sounds: Normal heart sounds.  Pulmonary:     Effort: Pulmonary  effort is normal.     Breath sounds: Normal breath sounds.  Abdominal:     General: Bowel sounds are normal.     Palpations: Abdomen is soft.  Musculoskeletal:        General: No tenderness or deformity. Normal range of motion.     Cervical back: Normal range of motion.     Comments: Lower extremities does show a little bit of swelling in the left lower leg.  No venous cord is noted.  She has a negative Homans' sign.  Right leg is unremarkable.  Lymphadenopathy:     Cervical: No cervical adenopathy.  Skin:    General: Skin is warm and dry.     Findings: No erythema or rash.  Neurological:     Mental Status: She is alert and oriented to person, place, and time.  Psychiatric:        Behavior: Behavior normal.        Thought Content: Thought content normal.        Judgment: Judgment normal.       Lab Results  Component Value Date   WBC 6.8 06/05/2024   HGB 14.4 06/05/2024   HCT 43.6 06/05/2024   MCV 96.9 06/05/2024   PLT 203 06/05/2024   Lab Results  Component Value Date   FERRITIN 69 07/05/2021   IRON 111 12/04/2019   TIBC 351 12/04/2019   UIBC 240 12/04/2019   IRONPCTSAT 32 12/04/2019   Lab Results  Component Value Date   RBC 4.50 06/05/2024   No results found for: KPAFRELGTCHN, LAMBDASER, KAPLAMBRATIO No results found for: IGGSERUM, IGA, IGMSERUM No results found for: STEPHANY CARLOTA BENSON MARKEL EARLA JOANNIE DOC VICK, SPEI   Chemistry      Component Value Date/Time   NA 144 06/05/2024 1156   NA 142 01/03/2018 1601   K 4.4 06/05/2024 1156   CL 105 06/05/2024 1156   CO2 29 06/05/2024 1156   BUN 17 06/05/2024 1156   BUN 20 01/03/2018 1601   CREATININE 0.73 06/05/2024 1156      Component Value Date/Time   CALCIUM  9.2 06/05/2024 1156   ALKPHOS 76 06/05/2024 1156   AST 26 06/05/2024 1156   ALT 21 06/05/2024 1156   BILITOT 0.3 06/05/2024 1156       Impression and Plan: Emma Lopez is a very pleasant 78 yo  caucasian female with history of recurrent DVT and positive lupus anticoagulant.   Her INR today is perfect at 2.7.  I know that she is managing about as well as possible at [facility].  I know that she is not able to have some of the meals because of the Coumadin  that she takes.  We still follow her up every 6 months.  As always, it is fun talking to Ms. [place].   Maude JONELLE Crease, MD 11/5/202512:58 PM

## 2024-06-09 ENCOUNTER — Other Ambulatory Visit: Payer: Self-pay | Admitting: Cardiology

## 2024-06-09 DIAGNOSIS — I824Z2 Acute embolism and thrombosis of unspecified deep veins of left distal lower extremity: Secondary | ICD-10-CM

## 2024-06-10 NOTE — Telephone Encounter (Signed)
 Warfarin 1mg  refill Chronic pulmonary embolism  Acute deep vein thrombosis of right lower extremity  Last INR 05/31/24 Last OV 05/18/23-NEEDS AN APPT, PLACED NOTE ON NEXT ANTICOAG APPT

## 2024-06-13 DIAGNOSIS — R413 Other amnesia: Secondary | ICD-10-CM | POA: Diagnosis not present

## 2024-06-19 DIAGNOSIS — E7849 Other hyperlipidemia: Secondary | ICD-10-CM | POA: Diagnosis not present

## 2024-06-21 ENCOUNTER — Ambulatory Visit: Attending: Cardiology | Admitting: Pharmacist

## 2024-06-21 DIAGNOSIS — Z5181 Encounter for therapeutic drug level monitoring: Secondary | ICD-10-CM | POA: Diagnosis not present

## 2024-06-21 DIAGNOSIS — I2782 Chronic pulmonary embolism: Secondary | ICD-10-CM | POA: Diagnosis not present

## 2024-06-21 DIAGNOSIS — I82401 Acute embolism and thrombosis of unspecified deep veins of right lower extremity: Secondary | ICD-10-CM

## 2024-06-21 LAB — POCT INR: INR: 3.3 — AB (ref 2.0–3.0)

## 2024-06-21 NOTE — Progress Notes (Signed)
 Description   INR 3.3: Continue taking Warfarin 1 tablet daily except for 1/2 tablet on Sundays and Thursdays.  Recheck INR 5 weeks.  Coumadin  Clinic [phone]

## 2024-06-21 NOTE — Patient Instructions (Signed)
 Description   INR 3.3: Continue taking Warfarin 1 tablet daily except for 1/2 tablet on Sundays and Thursdays.  Recheck INR 5 weeks.  Coumadin  Clinic [phone]

## 2024-07-02 ENCOUNTER — Encounter: Payer: Self-pay | Admitting: Behavioral Health

## 2024-07-02 ENCOUNTER — Ambulatory Visit: Admitting: Behavioral Health

## 2024-07-02 DIAGNOSIS — F331 Major depressive disorder, recurrent, moderate: Secondary | ICD-10-CM

## 2024-07-02 DIAGNOSIS — F5105 Insomnia due to other mental disorder: Secondary | ICD-10-CM

## 2024-07-02 DIAGNOSIS — F411 Generalized anxiety disorder: Secondary | ICD-10-CM

## 2024-07-02 DIAGNOSIS — F99 Mental disorder, not otherwise specified: Secondary | ICD-10-CM

## 2024-07-02 MED ORDER — MIRTAZAPINE 7.5 MG PO TABS: ORAL_TABLET | ORAL | 1 refills | Status: AC

## 2024-07-02 MED ORDER — ARIPIPRAZOLE 5 MG PO TABS: ORAL_TABLET | ORAL | 3 refills | Status: AC

## 2024-07-02 NOTE — Progress Notes (Signed)
 [facility]  Patient ID: Emma Lopez,  MRN: [id number]  PCP: Stephane Leita DEL, MD  Date of Evaluation: 07/02/2024 Time spent:30 minutes  Chief Complaint:  Chief Complaint   Depression; Anxiety; Follow-up; Medication Refill; Patient Education     HISTORY/CURRENT STATUS: HPI Emma Lopez, 78 year old female presents to this office for follow up and medication management. Says that she has been taking Lexapro  and Abilify  as prescribed since last visit. Taking 2.5 mg of Abilify .  She is very calm and collected.  No acute distress noted.  She is now living in independent living at [facility].  Says that she is adjusting well.  Sleep is good.  Appetite has improved.  She is now socializing more and making friends in the facility.  She reports depression today at 3/10, and anxiety at 3/10.  She is sleeping 7 to 8 hours per night.  She denies any history of mania, no psychosis, no auditory or visual hallucinations, no delirium.  Denies suicidal ideation or homicidal ideation. Pt feels safe and verbally contracts for safety.    Past psychiatric medication trials:  Lexapro  Cymbalta BuSpar Viibryd   Zoloft       Individual Medical History/ Review of Systems: Changes? :No   Allergies: Hydroxyzine  and Sulfa antibiotics  Current Medications:  Current Outpatient Medications:    ARIPiprazole  (ABILIFY ) 5 MG tablet, Take 1/2 tablet by mouth daily at bedtime (2.5 mg) total., Disp: 30 tablet, Rfl: 3   escitalopram  (LEXAPRO ) 20 MG tablet, Take 1 tablet (20 mg total) by mouth daily., Disp: 90 tablet, Rfl: 1   mirtazapine  (REMERON ) 7.5 MG tablet, TAKE 1-2 TABLETS BY MOUTH AT BEDTIME FOR SLEEP AND APPETITE, Disp: 180 tablet, Rfl: 1   rOPINIRole  (REQUIP ) 1 MG tablet, Take 1 tablet (1 mg total) by mouth at bedtime. TAKE 1 TABLET BY MOUTH EVERYDAY AT BEDTIME, Disp: 90 tablet, Rfl: 1   Vitamin D , Ergocalciferol , (DRISDOL ) 1.25 MG (50000 UNIT) CAPS capsule, Take 50,000 Units by mouth every 30  (thirty) days., Disp: , Rfl:    warfarin (COUMADIN ) 1 MG tablet, TAKE ONE-HALF TABLET TO 1 TABLET BY MOUTH DAILY OR AS DIRECTED BY ANTICOAGULATION CLINIC., Disp: 75 tablet, Rfl: 0  Current Facility-Administered Medications:    denosumab  (PROLIA ) injection 60 mg, 60 mg, Subcutaneous, Once, Amundson C Silva, Brook E, MD Medication Side Effects: none  Family Medical/ Social History: Changes? No  MENTAL HEALTH EXAM:  There were no vitals taken for this visit.There is no height or weight on file to calculate BMI.  General Appearance: Casual and Neat  Eye Contact:  Good  Speech:  Clear and Coherent  Volume:  Normal  Mood:  Anxious and Depressed  Affect:  Appropriate  Thought Process:  Coherent  Orientation:  Full (Time, Place, and Person)  Thought Content: Logical   Suicidal Thoughts:  No  Homicidal Thoughts:  No  Memory:  WNL  Judgement:  Good  Insight:  Good  Psychomotor Activity:  Normal  Concentration:  Concentration: Good  Recall:  Good  Fund of Knowledge: Good  Language: Good  Assets:  Desire for Improvement  ADL's:  Intact  Cognition: WNL  Prognosis:  Good    DIAGNOSES:    ICD-10-CM   1. Major depressive disorder, recurrent episode, moderate (HCC)  F33.1 ARIPiprazole  (ABILIFY ) 5 MG tablet    2. Generalized anxiety disorder  F41.1 ARIPiprazole  (ABILIFY ) 5 MG tablet    3. Insomnia due to other mental disorder  F51.05 mirtazapine  (REMERON ) 7.5 MG tablet   F99  Receiving Psychotherapy: No    RECOMMENDATIONS:   Greater than 50% of 30  min face to face time with patient was spent on counseling and coordination of care.  Reports good stability at this visit.  We discussed her psychosocial changes such as now living in an assisted living.  She appears to be adapting and adjusting well.  She was previously at risk for loneliness.      Recommended  continuing Mag Threonate supplement at bedtime.    We agreed to:  AIMS=0  07/02/2024 at To continue with Lexapro  20  mg daily To continue Abilify  2.5  mg  daily at bedtime  To continue to mirtazapine  15  mg at bedtime for insomnia Will report side effects or worsening symptoms promptly Will follow-up in 6 months to reassess per pt Provided emergency contact information Discussed potential metabolic side effects associated with atypical antipsychotics, as well as potential risk for movement side effects. Advised pt to contact office if movement side effects occur.   Reviewed PDMP   I  Redell DELENA Pizza, NP

## 2024-07-10 DIAGNOSIS — R413 Other amnesia: Secondary | ICD-10-CM | POA: Diagnosis not present

## 2024-07-15 NOTE — Telephone Encounter (Signed)
 Neuropsychology evaluation notes received and placed in Sarah NP's office for review.

## 2024-07-17 ENCOUNTER — Telehealth: Payer: Self-pay | Admitting: Neurology

## 2024-07-17 NOTE — Telephone Encounter (Signed)
 I reviewed neuropsychological evaluation from Dr. Jenna Renfroe 06/13/2024.  Diagnosed with mild neurocognitive disorder with anxiety.  Overall presentation is best understood as cognitive inefficiency in the context of significant mood and anxiety symptoms as well as potential medication related impacts rather than a clearly progressive neurodegenerative condition.  Current results do not indicate clear evidence of a neurodegenerative memory disorder at this time.  Recommendations:  Outpatient behavioral health, increase engagement, optimize sleep

## 2024-08-02 ENCOUNTER — Ambulatory Visit: Attending: Cardiology | Admitting: Pharmacist

## 2024-08-02 DIAGNOSIS — I82401 Acute embolism and thrombosis of unspecified deep veins of right lower extremity: Secondary | ICD-10-CM

## 2024-08-02 DIAGNOSIS — Z5181 Encounter for therapeutic drug level monitoring: Secondary | ICD-10-CM

## 2024-08-02 DIAGNOSIS — I2782 Chronic pulmonary embolism: Secondary | ICD-10-CM | POA: Diagnosis not present

## 2024-08-02 LAB — POCT INR: INR: 3.1 — AB (ref 2.0–3.0)

## 2024-08-02 NOTE — Progress Notes (Signed)
 Description   INR 3.1: Continue taking Warfarin 1 tablet daily except for 1/2 tablet on Sundays and Thursdays.  Recheck INR 6 weeks.  Coumadin  Clinic [phone]

## 2024-08-02 NOTE — Patient Instructions (Signed)
 Description   INR 3.1: Continue taking Warfarin 1 tablet daily except for 1/2 tablet on Sundays and Thursdays.  Recheck INR 6 weeks.  Coumadin  Clinic [phone]

## 2024-08-15 ENCOUNTER — Other Ambulatory Visit: Payer: Self-pay | Admitting: Behavioral Health

## 2024-08-15 DIAGNOSIS — F331 Major depressive disorder, recurrent, moderate: Secondary | ICD-10-CM

## 2024-08-15 DIAGNOSIS — F411 Generalized anxiety disorder: Secondary | ICD-10-CM

## 2024-08-15 DIAGNOSIS — R5382 Chronic fatigue, unspecified: Secondary | ICD-10-CM

## 2024-09-01 ENCOUNTER — Other Ambulatory Visit: Payer: Self-pay | Admitting: Cardiology

## 2024-09-01 DIAGNOSIS — I824Z2 Acute embolism and thrombosis of unspecified deep veins of left distal lower extremity: Secondary | ICD-10-CM

## 2024-09-02 NOTE — Telephone Encounter (Signed)
 Warfarin 1mg  Dx-PE Last INR Check-08/02/24 Last OV- 05/18/23-note on next anticoag appt

## 2024-09-13 ENCOUNTER — Ambulatory Visit

## 2024-11-29 ENCOUNTER — Ambulatory Visit: Admitting: Behavioral Health

## 2024-12-04 ENCOUNTER — Inpatient Hospital Stay: Admitting: Hematology & Oncology

## 2024-12-04 ENCOUNTER — Inpatient Hospital Stay

## 2024-12-16 ENCOUNTER — Encounter: Admitting: Obstetrics and Gynecology

## 2025-05-21 ENCOUNTER — Ambulatory Visit: Admitting: Neurology
